# Patient Record
Sex: Female | Born: 1945
Health system: Southern US, Community
[De-identification: ages and names within clinical notes are randomized; demographics above are authoritative.]

## PROBLEM LIST (undated history)

## (undated) DIAGNOSIS — K5900 Constipation, unspecified: Secondary | ICD-10-CM

## (undated) DIAGNOSIS — K259 Gastric ulcer, unspecified as acute or chronic, without hemorrhage or perforation: Secondary | ICD-10-CM

## (undated) DIAGNOSIS — H548 Legal blindness, as defined in USA: Secondary | ICD-10-CM

## (undated) DIAGNOSIS — I495 Sick sinus syndrome: Secondary | ICD-10-CM

## (undated) DIAGNOSIS — N281 Cyst of kidney, acquired: Secondary | ICD-10-CM

## (undated) DIAGNOSIS — E559 Vitamin D deficiency, unspecified: Secondary | ICD-10-CM

## (undated) DIAGNOSIS — I441 Atrioventricular block, second degree: Secondary | ICD-10-CM

## (undated) DIAGNOSIS — R2689 Other abnormalities of gait and mobility: Secondary | ICD-10-CM

## (undated) DIAGNOSIS — R296 Repeated falls: Secondary | ICD-10-CM

## (undated) DIAGNOSIS — R600 Localized edema: Secondary | ICD-10-CM

## (undated) DIAGNOSIS — I10 Essential (primary) hypertension: Secondary | ICD-10-CM

## (undated) DIAGNOSIS — T8859XA Other complications of anesthesia, initial encounter: Secondary | ICD-10-CM

## (undated) DIAGNOSIS — M1990 Unspecified osteoarthritis, unspecified site: Secondary | ICD-10-CM

## (undated) DIAGNOSIS — Z87828 Personal history of other (healed) physical injury and trauma: Secondary | ICD-10-CM

## (undated) DIAGNOSIS — H269 Unspecified cataract: Secondary | ICD-10-CM

## (undated) DIAGNOSIS — Z860100 Personal history of colon polyps, unspecified: Secondary | ICD-10-CM

## (undated) DIAGNOSIS — N200 Calculus of kidney: Secondary | ICD-10-CM

## (undated) DIAGNOSIS — T7840XA Allergy, unspecified, initial encounter: Secondary | ICD-10-CM

## (undated) DIAGNOSIS — Z45018 Encounter for adjustment and management of other part of cardiac pacemaker: Secondary | ICD-10-CM

## (undated) DIAGNOSIS — K219 Gastro-esophageal reflux disease without esophagitis: Secondary | ICD-10-CM

## (undated) DIAGNOSIS — R202 Paresthesia of skin: Secondary | ICD-10-CM

## (undated) DIAGNOSIS — H348192 Central retinal vein occlusion, unspecified eye, stable: Secondary | ICD-10-CM

## (undated) DIAGNOSIS — E669 Obesity, unspecified: Secondary | ICD-10-CM

## (undated) DIAGNOSIS — R0602 Shortness of breath: Secondary | ICD-10-CM

## (undated) DIAGNOSIS — W19XXXA Unspecified fall, initial encounter: Secondary | ICD-10-CM

## (undated) DIAGNOSIS — R350 Frequency of micturition: Secondary | ICD-10-CM

## (undated) DIAGNOSIS — F419 Anxiety disorder, unspecified: Secondary | ICD-10-CM

## (undated) DIAGNOSIS — T4145XA Adverse effect of unspecified anesthetic, initial encounter: Secondary | ICD-10-CM

## (undated) DIAGNOSIS — Z950 Presence of cardiac pacemaker: Secondary | ICD-10-CM

## (undated) DIAGNOSIS — Z8709 Personal history of other diseases of the respiratory system: Secondary | ICD-10-CM

## (undated) HISTORY — DX: Anxiety disorder, unspecified: F41.9

## (undated) HISTORY — PX: CHOLECYSTECTOMY: SHX55

## (undated) HISTORY — DX: Essential (primary) hypertension: I10

## (undated) HISTORY — DX: Central retinal vein occlusion, unspecified eye, stable: H34.8192

## (undated) HISTORY — DX: Calculus of kidney: N20.0

## (undated) HISTORY — PX: UPPER GI ENDOSCOPY: SHX6162

## (undated) HISTORY — DX: Localized edema: R60.0

## (undated) HISTORY — DX: Allergy, unspecified, initial encounter: T78.40XA

## (undated) HISTORY — DX: Gastro-esophageal reflux disease without esophagitis: K21.9

## (undated) HISTORY — PX: TONSILLECTOMY: SUR1361

## (undated) HISTORY — DX: Obesity, unspecified: E66.9

## (undated) HISTORY — DX: Vitamin D deficiency, unspecified: E55.9

## (undated) HISTORY — PX: EYE SURGERY: SHX253

## (undated) HISTORY — DX: Constipation, unspecified: K59.00

## (undated) HISTORY — DX: Shortness of breath: R06.02

---
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
## 1992-01-22 DIAGNOSIS — J329 Chronic sinusitis, unspecified: Secondary | ICD-10-CM | POA: Insufficient documentation

## 1998-01-07 ENCOUNTER — Encounter: Admission: RE | Admit: 1998-01-07 | Discharge: 1998-04-07 | Payer: Self-pay | Admitting: Family Medicine

## 1998-02-28 ENCOUNTER — Emergency Department (HOSPITAL_COMMUNITY): Admission: EM | Admit: 1998-02-28 | Discharge: 1998-02-28 | Payer: Self-pay | Admitting: Emergency Medicine

## 1998-05-22 ENCOUNTER — Encounter: Admission: RE | Admit: 1998-05-22 | Discharge: 1998-08-20 | Payer: Self-pay | Admitting: Family Medicine

## 2000-03-08 ENCOUNTER — Encounter: Admission: RE | Admit: 2000-03-08 | Discharge: 2000-06-06 | Payer: Self-pay | Admitting: Family Medicine

## 2000-05-31 ENCOUNTER — Encounter: Admission: RE | Admit: 2000-05-31 | Discharge: 2000-08-29 | Payer: Self-pay | Admitting: Family Medicine

## 2002-06-19 ENCOUNTER — Encounter: Admission: RE | Admit: 2002-06-19 | Discharge: 2002-09-17 | Payer: Self-pay

## 2002-10-04 ENCOUNTER — Encounter: Admission: RE | Admit: 2002-10-04 | Discharge: 2003-01-02 | Payer: Self-pay

## 2003-01-14 ENCOUNTER — Encounter: Admission: RE | Admit: 2003-01-14 | Discharge: 2003-01-14 | Payer: Self-pay | Admitting: Family Medicine

## 2004-09-17 ENCOUNTER — Other Ambulatory Visit: Admission: RE | Admit: 2004-09-17 | Discharge: 2004-09-17 | Payer: Self-pay | Admitting: Family Medicine

## 2004-10-19 LAB — HM DEXA SCAN: HM Dexa Scan: NORMAL

## 2006-01-11 ENCOUNTER — Ambulatory Visit: Payer: Self-pay | Admitting: Family Medicine

## 2006-01-12 ENCOUNTER — Emergency Department (HOSPITAL_COMMUNITY): Admission: EM | Admit: 2006-01-12 | Discharge: 2006-01-12 | Payer: Self-pay | Admitting: Emergency Medicine

## 2006-02-08 ENCOUNTER — Emergency Department (HOSPITAL_COMMUNITY): Admission: EM | Admit: 2006-02-08 | Discharge: 2006-02-08 | Payer: Self-pay | Admitting: Emergency Medicine

## 2006-03-11 ENCOUNTER — Ambulatory Visit (HOSPITAL_COMMUNITY): Admission: RE | Admit: 2006-03-11 | Discharge: 2006-03-11 | Payer: Self-pay | Admitting: Certified Registered"

## 2006-03-21 ENCOUNTER — Ambulatory Visit (HOSPITAL_COMMUNITY): Admission: RE | Admit: 2006-03-21 | Discharge: 2006-03-21 | Payer: Self-pay | Admitting: Surgery

## 2006-03-21 ENCOUNTER — Encounter (INDEPENDENT_AMBULATORY_CARE_PROVIDER_SITE_OTHER): Payer: Self-pay | Admitting: *Deleted

## 2007-02-14 ENCOUNTER — Ambulatory Visit: Payer: Self-pay | Admitting: Family Medicine

## 2007-02-20 ENCOUNTER — Ambulatory Visit: Payer: Self-pay | Admitting: Family Medicine

## 2007-03-06 ENCOUNTER — Ambulatory Visit: Payer: Self-pay | Admitting: Family Medicine

## 2007-05-30 ENCOUNTER — Ambulatory Visit: Payer: Self-pay | Admitting: Family Medicine

## 2008-02-27 ENCOUNTER — Ambulatory Visit: Payer: Self-pay | Admitting: Family Medicine

## 2008-02-27 ENCOUNTER — Other Ambulatory Visit: Admission: RE | Admit: 2008-02-27 | Discharge: 2008-02-27 | Payer: Self-pay | Admitting: Family Medicine

## 2008-02-27 ENCOUNTER — Encounter: Payer: Self-pay | Admitting: Family Medicine

## 2008-02-27 LAB — HM PAP SMEAR: HM Pap smear: NORMAL

## 2009-02-21 HISTORY — PX: COLONOSCOPY: SHX174

## 2009-02-21 LAB — HM COLONOSCOPY

## 2009-05-06 ENCOUNTER — Ambulatory Visit: Payer: Self-pay | Admitting: Family Medicine

## 2009-05-21 ENCOUNTER — Encounter (INDEPENDENT_AMBULATORY_CARE_PROVIDER_SITE_OTHER): Payer: Self-pay | Admitting: *Deleted

## 2009-05-25 ENCOUNTER — Ambulatory Visit: Payer: Self-pay | Admitting: Gastroenterology

## 2009-06-01 ENCOUNTER — Telehealth (INDEPENDENT_AMBULATORY_CARE_PROVIDER_SITE_OTHER): Payer: Self-pay | Admitting: *Deleted

## 2009-06-03 ENCOUNTER — Ambulatory Visit: Payer: Self-pay | Admitting: Gastroenterology

## 2009-06-08 ENCOUNTER — Encounter: Payer: Self-pay | Admitting: Gastroenterology

## 2010-03-23 NOTE — Letter (Signed)
Summary: [facility] Instructions  [facility]  [address] [place], [place] [address]   Phone: [phone]  Fax: [phone]       Margaret Reese    66-Feb-1947    MRN: [id number]        Procedure Day Dorna Bloom:  Wednesday  06/03/2009       Arrival Time:  8:00 am     Procedure Time: 9:00 am     Location of Procedure:                    _x_  [facility] (4th Floor)                        PREPARATION FOR COLONOSCOPY WITH MOVIPREP   Starting 5 days prior to your procedure _4/09/2009 _ do not eat nuts, seeds, popcorn, corn, beans, peas,  salads, or any raw vegetables.  Do not take any fiber supplements (e.g. Metamucil, Citrucel, and Benefiber).  THE DAY BEFORE YOUR PROCEDURE         DATE: _ 06/02/2009 _  DAY: _ Tuesday _  1.  Drink clear liquids the entire day-NO SOLID FOOD  2.  Do not drink anything colored red or purple.  Avoid juices with pulp.  No orange juice.  3.  Drink at least 64 oz. (8 glasses) of fluid/clear liquids during the day to prevent dehydration and help the prep work efficiently.  CLEAR LIQUIDS INCLUDE: Water Jello Ice Popsicles Tea (sugar ok, no milk/cream) Powdered fruit flavored drinks Coffee (sugar ok, no milk/cream) Gatorade Juice: apple, white grape, white cranberry  Lemonade Clear bullion, consomm, broth Carbonated beverages (any kind) Strained chicken noodle soup Hard Candy                             4.  In the morning, mix first dose of MoviPrep solution:    Empty 1 Pouch A and 1 Pouch B into the disposable container    Add lukewarm drinking water to the top line of the container. Mix to dissolve    Refrigerate (mixed solution should be used within 24 hrs)  5.  Begin drinking the prep at 5:00 p.m. The MoviPrep container is divided by 4 marks.   Every 15 minutes drink the solution down to the next mark (approximately 8 oz) until the full liter is complete.   6.  Follow completed prep with 16 oz of clear liquid  of your choice (Nothing red or purple).  Continue to drink clear liquids until bedtime.  7.  Before going to bed, mix second dose of MoviPrep solution:    Empty 1 Pouch A and 1 Pouch B into the disposable container    Add lukewarm drinking water to the top line of the container. Mix to dissolve    Refrigerate  THE DAY OF YOUR PROCEDURE      DATE: _ 06/03/2009 _ DAY: _Wednesday _  Beginning at _ 4:00 _a.m. (5 hours before procedure):         1. Every 15 minutes, drink the solution down to the next mark (approx 8 oz) until the full liter is complete.  2. Follow completed prep with 16 oz. of clear liquid of your choice.    3. You may drink clear liquids until _ 7:00 am _ (2 HOURS BEFORE PROCEDURE).   MEDICATION INSTRUCTIONS  Unless otherwise instructed, you should take regular prescription medications  with a small sip of water   as early as possible the morning of your procedure.  Diabetic patients - see separate instructions.   Additional medication instructions: _ Do not take your HCTZ the morning of your procedure.         OTHER INSTRUCTIONS  You will need a responsible adult at least 66 years of age to accompany you and drive you home.   This person must remain in the waiting room during your procedure.  Wear loose fitting clothing that is easily removed.  Leave jewelry and other valuables at home.  However, you may wish to bring a book to read or  an iPod/MP3 player to listen to music as you wait for your procedure to start.  Remove all body piercing jewelry and leave at home.  Total time from sign-in until discharge is approximately 2-3 hours.  You should go home directly after your procedure and rest.  You can resume normal activities the  day after your procedure.  The day of your procedure you should not:   Drive   Make legal decisions   Operate machinery   Drink alcohol   Return to work  You will receive specific instructions about eating,  activities and medications before you leave.    The above instructions have been reviewed and explained to me by   [name], RN______________________    I fully understand and can verbalize these instructions _____________________________ Date _________

## 2010-03-23 NOTE — Letter (Signed)
Summary: Results Letter  [facility]  [address] [place], [place] [address]   Phone: [phone]  Fax: [phone]        June 08, 2009 MRN: [id number]    Margaret Reese [address] [place], [place]  [address]    Dear Margaret Reese,   At least one of the polyps removed during your recent procedure was proven to be adenomatous.  These are pre-cancerous polyps that may have grown into cancers if they had not been removed.  Based on current nationally recognized surveillance guidelines, I recommend that you have a repeat colonoscopy in 5 years.  We will therefore put your information in our reminder system and will contact you in 5 years to schedule a repeat procedure.  Please call if you have any questions or concerns.       Sincerely,  Rachael Fee MD  This letter has been electronically signed by your physician.  Appended Document: Results Letter letter mailed 4.20.11.

## 2010-03-23 NOTE — Miscellaneous (Signed)
Summary: previsit  Clinical Lists Changes  Medications: Added new medication of MOVIPREP 100 GM  SOLR (PEG-KCL-NACL-NASULF-NA ASC-C) As directed - Signed Rx of MOVIPREP 100 GM  SOLR (PEG-KCL-NACL-NASULF-NA ASC-C) As directed;  #1 x 0;  Signed;  Entered by: [name] RN;  Authorized by: Rachael Fee MD;  Method used: Electronically to [facility]. [phone]*, [name], [place], [place]  [address], Ph: [phone], Fax: [phone] Observations: Added new observation of ALLERGY REV: Done (05/25/2009 10:30)    Prescriptions: MOVIPREP 100 GM  SOLR (PEG-KCL-NACL-NASULF-NA ASC-C) As directed  #1 x 0   Entered by:   [name] RN   Authorized by:   Rachael Fee MD   Signed by:   [name] RN on 05/25/2009   Method used:   Electronically to        [facility]. [phone]* (retail)       [address]       [place], [place]  [address]       Ph: [phone]       Fax: [phone]   RxID:   [id number]

## 2010-03-23 NOTE — Progress Notes (Signed)
Summary: Questions about procedure  Phone Note Call from Patient Call back at Home Phone [phone]   Caller: Patient Call For: Dr. Christella Hartigan Reason for Call: Talk to Nurse Summary of Call: pt. has had kidney stones 4-22yrs ago. Passed some and wants to know if that would affect procedure Initial call taken by: Karna Christmas,  June 01, 2009 11:49 AM  Follow-up for Phone Call        Left message for patient that kidney stones would not effect our procedure at all and to do her prep as directed.  call if any further questions. Follow-up by: [name] RN,  June 01, 2009 3:21 PM

## 2010-03-23 NOTE — Procedures (Signed)
Summary: Colonoscopy  Patient: Margaret Reese Note: All result statuses are Final unless otherwise noted.  Tests: (1) Colonoscopy (COL)   COL Colonoscopy           DONE     [facility]     [address]     [place], [place]  [address]           COLONOSCOPY PROCEDURE REPORT           PATIENT:  Margaret Reese, Margaret Reese  MR#:  [id number]     BIRTHDATE:  09-Jan-1946, 63 yrs. old  GENDER:  female     ENDOSCOPIST:  Rachael Fee, MD     REF. BY:  Sharlot Gowda, M.D.     PROCEDURE DATE:  06/03/2009     PROCEDURE:  Colonoscopy with snare polypectomy     ASA CLASS:  Class II     INDICATIONS:  Routine Risk Screening     MEDICATIONS:   Fentanyl 75 mcg IV, Versed 8 mg IV     DESCRIPTION OF PROCEDURE:   After the risks benefits and     alternatives of the procedure were thoroughly explained, informed     consent was obtained.  Digital rectal exam was performed and     revealed no rectal masses.   The LB CF-[id number] endoscope     was introduced through the anus and advanced to the cecum, which     was identified by both the appendix and ileocecal valve, without     limitations.  The quality of the prep was excellent, using     MoviPrep.  The instrument was then slowly withdrawn as the colon     was fully examined.     <<PROCEDUREIMAGES>>     FINDINGS:  A sessile polyp was found in the descending colon. This     was 3mm, removed with cold snare and sent to pathology (see     image5).  Mild diverticulosis was found in the sigmoid to     descending colon segments (see image1).  This was otherwise a     normal examination of the colon (see image2, image4, and image6).     Retroflexed views in the rectum revealed no abnormalities.    The     scope was then withdrawn from the patient and the procedure     completed.     COMPLICATIONS:  None     ENDOSCOPIC IMPRESSION:     1) Small sessile polyp in the descending colon; removed and sent     to pathology     2) Mild diverticulosis in the  sigmoid to descending colon     segments     3) Otherwise normal examination           RECOMMENDATIONS:     1) If the polyp(s) removed today are proven to be adenomatous     (pre-cancerous) polyps, you will need a repeat colonoscopy in 5     years. Otherwise you should continue to follow colorectal cancer     screening guidelines for "routine risk" patients with colonoscopy     in 10 years.     2) You will receive a letter within 1-2 weeks with the results     of your biopsy as well as final recommendations. Please call my     office if you have not received a letter after 3 weeks.           ______________________________  Rachael Fee, MD           n.     Rosalie Doctor:   Rachael Fee at 06/03/2009 09:11 AM           Gemma Payor, [id number]  Note: An exclamation mark (!) indicates a result that was not dispersed into the flowsheet. Document Creation Date: 06/03/2009 9:12 AM _______________________________________________________________________  (1) Order result status: Final Collection or observation date-time: 06/03/2009 09:07 Requested date-time:  Receipt date-time:  Reported date-time:  Referring Physician:   Ordering Physician: Rob Bunting [phone]) Specimen Source:  Source: Launa Grill Order Number: [id number] Lab site:   Appended Document: Colonoscopy     Procedures Next Due Date:    Colonoscopy: 05/2014

## 2010-06-08 ENCOUNTER — Encounter: Payer: Self-pay | Admitting: Family Medicine

## 2010-06-09 ENCOUNTER — Encounter: Payer: Self-pay | Admitting: Family Medicine

## 2010-06-09 DIAGNOSIS — J40 Bronchitis, not specified as acute or chronic: Secondary | ICD-10-CM

## 2010-06-09 DIAGNOSIS — J329 Chronic sinusitis, unspecified: Secondary | ICD-10-CM

## 2010-07-08 ENCOUNTER — Encounter: Payer: Self-pay | Admitting: Family Medicine

## 2010-07-08 ENCOUNTER — Ambulatory Visit (INDEPENDENT_AMBULATORY_CARE_PROVIDER_SITE_OTHER): Payer: Medicare Other | Admitting: Family Medicine

## 2010-07-08 VITALS — BP 140/80 | HR 82 | Ht 61.2 in | Wt 239.0 lb

## 2010-07-08 DIAGNOSIS — I10 Essential (primary) hypertension: Secondary | ICD-10-CM | POA: Insufficient documentation

## 2010-07-08 DIAGNOSIS — Z0000 Encounter for general adult medical examination without abnormal findings: Secondary | ICD-10-CM

## 2010-07-08 DIAGNOSIS — F439 Reaction to severe stress, unspecified: Secondary | ICD-10-CM

## 2010-07-08 DIAGNOSIS — K219 Gastro-esophageal reflux disease without esophagitis: Secondary | ICD-10-CM | POA: Insufficient documentation

## 2010-07-08 DIAGNOSIS — E669 Obesity, unspecified: Secondary | ICD-10-CM | POA: Insufficient documentation

## 2010-07-08 DIAGNOSIS — N200 Calculus of kidney: Secondary | ICD-10-CM

## 2010-07-08 DIAGNOSIS — Z733 Stress, not elsewhere classified: Secondary | ICD-10-CM

## 2010-07-08 DIAGNOSIS — E785 Hyperlipidemia, unspecified: Secondary | ICD-10-CM | POA: Insufficient documentation

## 2010-07-08 LAB — CBC WITH DIFFERENTIAL/PLATELET
Basophils Absolute: 0 10*3/uL (ref 0.0–0.1)
Basophils Relative: 0 % (ref 0–1)
Eosinophils Absolute: 0.3 10*3/uL (ref 0.0–0.7)
Eosinophils Relative: 4 % (ref 0–5)
HCT: 38.1 % (ref 36.0–46.0)
Hemoglobin: 12.7 g/dL (ref 12.0–15.0)
Lymphocytes Relative: 28 % (ref 12–46)
Lymphs Abs: 1.9 10*3/uL (ref 0.7–4.0)
MCH: 29.1 pg (ref 26.0–34.0)
MCHC: 33.3 g/dL (ref 30.0–36.0)
MCV: 87.2 fL (ref 78.0–100.0)
Monocytes Absolute: 0.3 10*3/uL (ref 0.1–1.0)
Monocytes Relative: 4 % (ref 3–12)
Neutro Abs: 4.4 10*3/uL (ref 1.7–7.7)
Neutrophils Relative %: 63 % (ref 43–77)
Platelets: 237 10*3/uL (ref 150–400)
RBC: 4.37 MIL/uL (ref 3.87–5.11)
RDW: 13.9 % (ref 11.5–15.5)
WBC: 6.9 10*3/uL (ref 4.0–10.5)

## 2010-07-08 LAB — COMPREHENSIVE METABOLIC PANEL
ALT: 23 U/L (ref 0–35)
AST: 22 U/L (ref 0–37)
Albumin: 4.4 g/dL (ref 3.5–5.2)
Alkaline Phosphatase: 116 U/L (ref 39–117)
BUN: 17 mg/dL (ref 6–23)
CO2: 25 mEq/L (ref 19–32)
Calcium: 10.2 mg/dL (ref 8.4–10.5)
Chloride: 101 mEq/L (ref 96–112)
Creat: 0.92 mg/dL (ref 0.40–1.20)
Glucose, Bld: 104 mg/dL — ABNORMAL HIGH (ref 70–99)
Potassium: 3.8 mEq/L (ref 3.5–5.3)
Sodium: 139 mEq/L (ref 135–145)
Total Bilirubin: 0.7 mg/dL (ref 0.3–1.2)
Total Protein: 6.9 g/dL (ref 6.0–8.3)

## 2010-07-08 LAB — LIPID PANEL
Cholesterol: 225 mg/dL — ABNORMAL HIGH (ref 0–200)
HDL: 49 mg/dL (ref >39)
LDL Cholesterol: 141 mg/dL — ABNORMAL HIGH (ref 0–99)
Total CHOL/HDL Ratio: 4.6 Ratio
Triglycerides: 175 mg/dL — ABNORMAL HIGH (ref <150)
VLDL: 35 mg/dL (ref 0–40)

## 2010-07-08 LAB — POCT URINALYSIS DIPSTICK
Bilirubin, UA: NEGATIVE
Blood, UA: NEGATIVE
Glucose, UA: NEGATIVE
Ketones, UA: NEGATIVE
Leukocytes, UA: NEGATIVE
Nitrite, UA: NEGATIVE
Protein, UA: NEGATIVE
Spec Grav, UA: 1.015
Urobilinogen, UA: NEGATIVE
pH, UA: 5

## 2010-07-08 MED ORDER — LISINOPRIL-HYDROCHLOROTHIAZIDE 10-12.5 MG PO TABS: 1.0000 | ORAL_TABLET | Freq: Every day | ORAL | Status: DC

## 2010-07-08 NOTE — Progress Notes (Signed)
Subjective:    Patient ID: Margaret Reese, female    DOB: Mar 29, 1945, 65 y.o.   MRN: [id number]  HPI she is here for a complete examination. Her main concern today is difficulty with pain. It was very difficult to get a good history from her. Since mid-December she has had difficulty with tingling sensation in her fingers that lasts for several minutes and cannot be associated with anything in particular. She also complains of a tingling sensation in her right leg that is usually associated with physical activities however this does not bother her if she is walking and holding onto a shopping cart. She was seen in urgent care for this and given a muscle relaxer and pain medication which did help. She notes that she has upper back pain especially with lifting activities. She is in the process of moving to a new home. He has a history of renal stones and is on potassium citrate for this. She was given Nexium did help with some of her GI symptoms which did help relieve she describes as a gurgling sensation in the back of her throat. He has been under a lot of stress dealing with recent illness of her husband as well as that of her mother . She has a previous history of asthma states that since being placed on the Nexium, she has not had to use any inhaler. Physical activities are quite limited.    Review of Systems  Constitutional: Positive for activity change.  HENT: Negative.   Eyes: Negative.   Respiratory: Negative for cough, chest tightness and shortness of breath.   Cardiovascular: Negative for chest pain, palpitations and leg swelling.  Gastrointestinal: Negative.   Genitourinary: Negative.   Musculoskeletal: Positive for back pain and arthralgias.  Neurological: Negative.        Objective:   Physical Exam BP 140/80  Pulse 82  Ht 5' 1.2" (1.554 m)  Wt 239 lb (108.41 kg)  BMI 44.86 kg/m2  General Appearance:    Alert, cooperative, no distress, appears stated age  Head:     Normocephalic, without obvious abnormality, atraumatic  Eyes:    PERRL, conjunctiva/corneas clear, EOM's intact, fundi    benign  Ears:    Normal TM's and external ear canals  Nose:   Nares normal, mucosa normal, no drainage or sinus   tenderness  Throat:   Lips, mucosa, and tongue normal; teeth and gums normal  Neck:   Supple, no lymphadenopathy;  thyroid:  no   enlargement/tenderness/nodules; no carotid   bruit or JVD  Back:    Spine nontender, no curvature, ROM normal, no CVA     tenderness  Lungs:     Clear to auscultation bilaterally without wheezes, rales or     ronchi; respirations unlabored  Chest Wall:    No tenderness or deformity   Heart:    Regular rate and rhythm, S1 and S2 normal, no murmur, rub   or gallop  Breast Exam:    Deferred   Abdomen:     Soft, non-tender, nondistended, normoactive bowel sounds,    no masses, no hepatosplenomegaly  Genitalia:    of exam shows no masses      Extremities:   No clubbing, cyanosis or edema  Pulses:   2+ and symmetric all extremities  Skin:   Skin color, texture, turgor normal, no rashes or lesions  Lymph nodes:   Cervical, supraclavicular, and axillary nodes normal  Neurologic:   CNII-XII intact, normal strength, sensation and gait;  reflexes 2+ and symmetric throughout          Psych:   Normal mood, affect, hygiene and grooming.          Assessment & Plan:  See problem list We discussed the back pain in detail as well as leg pain. I explained that I did not think this was anything significant and mainly musculoskeletal. I will refer her to physical for a general backend rehabilitation program. Also encouraged her to become more physically active and work on range of motion. I also discussed the stresses she is under. She did become quite tearful. Legitimized the amount of stress she is under do to recent deaths as well as move and dealing with her husband's illness.

## 2010-07-08 NOTE — Patient Instructions (Signed)
Continue on your present medications. We will refer you to physical therapy to help with your back and leg discomfort. Return here if any difficulties.

## 2010-07-09 ENCOUNTER — Telehealth: Payer: Self-pay

## 2010-07-09 NOTE — Progress Notes (Signed)
Left message for pt to call me back 

## 2010-07-09 NOTE — Telephone Encounter (Signed)
Pt informed of labs mailed diet info

## 2010-07-09 NOTE — Op Note (Signed)
NAME:  Margaret Reese, Margaret Reese              ACCOUNT NO.:  [id number]   MEDICAL RECORD NO.:  [id number]          PATIENT TYPE:  AMB   LOCATION:  DAY                          FACILITY:  [facility]   PHYSICIAN:  Thornton Park. Daphine Deutscher, MD  DATE OF BIRTH:  1945/03/25   DATE OF PROCEDURE:  03/21/2006  DATE OF DISCHARGE:                               OPERATIVE REPORT   PREOPERATIVE DIAGNOSIS:  Gallstones and chronic cholecystitis.   POSTOPERATIVE DIAGNOSIS:  Severe chronic cholecystitis.   PROCEDURE:  Laparoscopic cholecystectomy with intraoperative  cholangiogram (normal).   SURGEON:  Thornton Park. Daphine Deutscher, MD   ASSISTANT:  Anselm Pancoast. Zachery Dakins, M.D.   ANESTHESIA:  General endotracheal.   DESCRIPTION OF PROCEDURE:  Margaret Reese was taken to room 6 in the  morning of March 21, 2006 and given general anesthesia.  The abdomen  was prepped with Techni-Care and draped sterilely.  A longitudinal  incision was made down on the umbilicus through which the Hasson cannula  was inserted.  The abdomen was inflated and three other trocars were  placed.  Before doing that I had to take adhesions down on the right  side from previous surgery where her colon was stuck up and these were  avascular adhesions and were taken down with sharp dissection with  scissors.  The gallbladder was grasped, elevated, and it was noted to be  fused to the duodenum.  This was a marked change as if the beginnings of  a cholecystoduodenal fistula were occurring.  However, I was able to  discern a plane and with sharp dissection to take this down.  I  dissected free Calot's triangle and achieved a critical view.  I put a  clip on the gallbladder and incised a very tiny cystic duct.  I inserted  a Reddick catheter and took a dynamic cholangiogram which showed good  intrahepatic filling and free flow into the duodenum through a nice  tapered distal common bile duct.  No stones were noted.  The cystic duct  was then triple clipped and  divided.  Cystic artery had been identified  and was triple clipped and divided along with the Calot's node and then  the gallbladder was removed from the gallbladder bed with hook  electrocautery without entering it.  It was placed in a bag and brought  through the umbilicus where I had to enlarge the incision just to get  this large mass of stone material out.  I  repaired the umbilical defect not only with 0 Vicryls but with 0  Prolene.  All port sites were injected with 0.5% Marcaine and were  closed with 4-0 Vicryl.  The patient seemed to tolerate the procedure  well and was taken to recovery room in satisfactory condition.      Thornton Park Daphine Deutscher, MD  Electronically Signed     MBM/MEDQ  D:  03/21/2006  T:  03/21/2006  Job:  [id number]   cc:   Sharlot Gowda, M.D.  Fax: [phone]

## 2010-07-09 NOTE — Telephone Encounter (Signed)
Left message for pt to call me back 

## 2010-12-02 ENCOUNTER — Encounter: Payer: Self-pay | Admitting: Family Medicine

## 2011-02-28 ENCOUNTER — Encounter: Payer: Self-pay | Admitting: Family Medicine

## 2011-02-28 ENCOUNTER — Ambulatory Visit (INDEPENDENT_AMBULATORY_CARE_PROVIDER_SITE_OTHER): Payer: Medicare Other | Admitting: Family Medicine

## 2011-02-28 VITALS — BP 132/80 | HR 90 | Ht 61.0 in | Wt 236.0 lb

## 2011-02-28 DIAGNOSIS — K219 Gastro-esophageal reflux disease without esophagitis: Secondary | ICD-10-CM

## 2011-02-28 NOTE — Patient Instructions (Signed)
Call me Friday and let me know how you're doing on this medicine

## 2011-02-28 NOTE — Progress Notes (Signed)
  Subjective:    Patient ID: Margaret Reese, female    DOB: [birth year], 66 y.o.   MRN: [id number]  HPI She is here for consultation concerning continued difficulty with reflux symptoms. She had been on Nexium in the past however this stopped being effective. She then switched to Prilosec which was not effective. She recently started back on Nexium and again is having difficulty with acid reflux symptoms.  Review of Systems     O bjective:        Physical Exam alert and in no distress. Tympanic membranes and canals are normal. Throat is clear. Tonsils are normal. Neck is supple without adenopathy or thyromegaly. Cardiac exam shows a regular sinus rhythm without murmurs or gallops. Lungs are clear to auscultation.        Assessment & Plan:   1. GERD (gastroesophageal reflux disease)    a sample of Dexilant given. If this is not successful, refer to GI.

## 2011-03-04 ENCOUNTER — Telehealth: Payer: Self-pay | Admitting: Family Medicine

## 2011-03-04 NOTE — Telephone Encounter (Signed)
Go ahead and refer her. Unfortunately I don't have any other masses to offer

## 2011-03-04 NOTE — Telephone Encounter (Signed)
Pt informed aqpt jan14

## 2011-03-04 NOTE — Telephone Encounter (Signed)
Pt called.  The Dexilant is not working.  Do you want to refer her?  In the meantime, what do you want her to take?  She has one Dexilant left and then she also has some Nexium she could take?  Please advise pt.      [facility]

## 2011-03-07 DIAGNOSIS — Z86010 Personal history of colonic polyps: Secondary | ICD-10-CM | POA: Diagnosis not present

## 2011-03-07 DIAGNOSIS — K219 Gastro-esophageal reflux disease without esophagitis: Secondary | ICD-10-CM | POA: Diagnosis not present

## 2011-03-15 DIAGNOSIS — D131 Benign neoplasm of stomach: Secondary | ICD-10-CM | POA: Diagnosis not present

## 2011-03-15 DIAGNOSIS — K219 Gastro-esophageal reflux disease without esophagitis: Secondary | ICD-10-CM | POA: Diagnosis not present

## 2011-03-18 DIAGNOSIS — K219 Gastro-esophageal reflux disease without esophagitis: Secondary | ICD-10-CM | POA: Diagnosis not present

## 2011-04-25 DIAGNOSIS — K219 Gastro-esophageal reflux disease without esophagitis: Secondary | ICD-10-CM | POA: Diagnosis not present

## 2011-05-11 DIAGNOSIS — H2510 Age-related nuclear cataract, unspecified eye: Secondary | ICD-10-CM | POA: Diagnosis not present

## 2011-05-11 DIAGNOSIS — H348392 Tributary (branch) retinal vein occlusion, unspecified eye, stable: Secondary | ICD-10-CM | POA: Diagnosis not present

## 2011-08-09 ENCOUNTER — Encounter: Payer: Self-pay | Admitting: Internal Medicine

## 2011-08-09 DIAGNOSIS — Z1231 Encounter for screening mammogram for malignant neoplasm of breast: Secondary | ICD-10-CM | POA: Diagnosis not present

## 2011-08-10 ENCOUNTER — Encounter: Payer: Self-pay | Admitting: Family Medicine

## 2011-08-10 ENCOUNTER — Ambulatory Visit (INDEPENDENT_AMBULATORY_CARE_PROVIDER_SITE_OTHER): Payer: Medicare Other | Admitting: Family Medicine

## 2011-08-10 VITALS — BP 124/80 | HR 76 | Ht 61.0 in | Wt 224.0 lb

## 2011-08-10 DIAGNOSIS — K219 Gastro-esophageal reflux disease without esophagitis: Secondary | ICD-10-CM

## 2011-08-10 DIAGNOSIS — Z79899 Other long term (current) drug therapy: Secondary | ICD-10-CM

## 2011-08-10 DIAGNOSIS — N200 Calculus of kidney: Secondary | ICD-10-CM

## 2011-08-10 DIAGNOSIS — E785 Hyperlipidemia, unspecified: Secondary | ICD-10-CM

## 2011-08-10 DIAGNOSIS — I10 Essential (primary) hypertension: Secondary | ICD-10-CM

## 2011-08-10 DIAGNOSIS — J45909 Unspecified asthma, uncomplicated: Secondary | ICD-10-CM

## 2011-08-10 LAB — CBC WITH DIFFERENTIAL/PLATELET
Basophils Absolute: 0 10*3/uL (ref 0.0–0.1)
Basophils Relative: 0 % (ref 0–1)
Eosinophils Absolute: 0.2 10*3/uL (ref 0.0–0.7)
Eosinophils Relative: 3 % (ref 0–5)
HCT: 39.8 % (ref 36.0–46.0)
Hemoglobin: 13.4 g/dL (ref 12.0–15.0)
Lymphocytes Relative: 33 % (ref 12–46)
Lymphs Abs: 2.5 10*3/uL (ref 0.7–4.0)
MCH: 29.1 pg (ref 26.0–34.0)
MCHC: 33.7 g/dL (ref 30.0–36.0)
MCV: 86.5 fL (ref 78.0–100.0)
Monocytes Absolute: 0.3 10*3/uL (ref 0.1–1.0)
Monocytes Relative: 4 % (ref 3–12)
Neutro Abs: 4.6 10*3/uL (ref 1.7–7.7)
Neutrophils Relative %: 60 % (ref 43–77)
Platelets: 269 10*3/uL (ref 150–400)
RBC: 4.6 MIL/uL (ref 3.87–5.11)
RDW: 14.6 % (ref 11.5–15.5)
WBC: 7.6 10*3/uL (ref 4.0–10.5)

## 2011-08-10 LAB — POCT URINALYSIS DIPSTICK
Bilirubin, UA: NEGATIVE
Blood, UA: NEGATIVE
Glucose, UA: NEGATIVE
Leukocytes, UA: NEGATIVE
Nitrite, UA: NEGATIVE
Spec Grav, UA: 1.02
Urobilinogen, UA: NEGATIVE
pH, UA: 5

## 2011-08-10 LAB — HM MAMMOGRAPHY: HM Mammogram: NORMAL

## 2011-08-10 MED ORDER — POTASSIUM CITRATE ER 5 MEQ (540 MG) PO TBCR: 5.0000 meq | EXTENDED_RELEASE_TABLET | Freq: Two times a day (BID) | ORAL | Status: DC

## 2011-08-10 MED ORDER — LISINOPRIL-HYDROCHLOROTHIAZIDE 10-12.5 MG PO TABS: 1.0000 | ORAL_TABLET | Freq: Every day | ORAL | Status: DC

## 2011-08-10 MED ORDER — ALBUTEROL SULFATE HFA 108 (90 BASE) MCG/ACT IN AERS: 2.0000 | INHALATION_SPRAY | Freq: Four times a day (QID) | RESPIRATORY_TRACT | Status: DC | PRN

## 2011-08-10 NOTE — Progress Notes (Signed)
Subjective:    Patient ID: Margaret Reese, female    DOB: 03/31/1945, 66 y.o.   MRN: [id number]  HPI She is here for a general checkup including renew her medications. She is now retired and is remaining physically active. He returned is going quite well. She and her husband are starting to get involved in other activities as well as traveling. She has had some difficulty with her m easy and finds that the recumbent bike tends to help with the symptoms. She lost some weight. Presently she is on no medication for her reflux and is being followed by Dr. Elnoria Howard. Her symptoms seem to be under fairly good control. Her allergies and asthma give her very little trouble. She would like a refill on her albuterol. She does have history of renal stones and would like a refill on her medication for that. She has no other concerns or complaints.  Review of Systems  Constitutional: Negative.   HENT: Negative.   Respiratory: Negative.   Cardiovascular: Negative.   Gastrointestinal: Negative.   Genitourinary: Negative.   Musculoskeletal: Positive for arthralgias.  Psychiatric/Behavioral: Negative.        Objective:   Physical Exam BP 124/80  Pulse 76  Ht 5\' 1"  (1.549 m)  Wt 224 lb (101.606 kg)  BMI 42.32 kg/m2  General Appearance:    Alert, cooperative, no distress, appears stated age  Head:    Normocephalic, without obvious abnormality, atraumatic  Eyes:    PERRL, conjunctiva/corneas clear, EOM's intact, fundi    benign  Ears:    Normal TM's and external ear canals  Nose:   Nares normal, mucosa normal, no drainage or sinus   tenderness  Throat:   Lips, mucosa, and tongue normal; teeth and gums normal  Neck:   Supple, no lymphadenopathy;  thyroid:  no   enlargement/tenderness/nodules; no carotid   bruit or JVD  Back:    Spine nontender, no curvature, ROM normal, no CVA     tenderness  Lungs:     Clear to auscultation bilaterally without wheezes, rales or     ronchi; respirations unlabored    Chest Wall:    No tenderness or deformity   Heart:    Regular rate and rhythm, S1 and S2 normal, no murmur, rub   or gallop  Breast Exam:    Deferred to GYN  Abdomen:     Soft, non-tender, nondistended, normoactive bowel sounds,    no masses, no hepatosplenomegaly  Genitalia:    Deferred to GYN     Extremities:   No clubbing, cyanosis or edema  Pulses:   2+ and symmetric all extremities  Skin:   Skin color, texture, turgor normal, no rashes or lesions  Lymph nodes:   Cervical, supraclavicular, and axillary nodes normal  Neurologic:   CNII-XII intact, normal strength, sensation and gait; reflexes 2+ and symmetric throughout          Psych:   Normal mood, affect, hygiene and grooming.           Assessment & Plan:   1. Hypertension  POCT Urinalysis Dipstick, lisinopril-hydrochlorothiazide (PRINZIDE,ZESTORETIC) 10-12.5 MG per tablet, CBC with Differential, Comprehensive metabolic panel  2. Obesity, Class III, BMI 40-49.9 (morbid obesity)  Lipid panel  3. GERD (gastroesophageal reflux disease)    4. Hyperlipidemia    5. Asthma with allergic rhinitis  albuterol (PROAIR HFA) 108 (90 BASE) MCG/ACT inhaler  6. Renal stones  potassium citrate (UROCIT-K) 5 MEQ (540 MG) SR tablet  7. Encounter  for long-term (current) use of other medications  CBC with Differential, Comprehensive metabolic panel, Lipid panel   also discussed routine issues including colonoscopy immunizations and DEXA. She is up-to-date on all of these.

## 2011-08-11 LAB — LIPID PANEL
Cholesterol: 232 mg/dL — ABNORMAL HIGH (ref 0–200)
HDL: 56 mg/dL (ref >39)
LDL Cholesterol: 153 mg/dL — ABNORMAL HIGH (ref 0–99)
Total CHOL/HDL Ratio: 4.1 Ratio
Triglycerides: 117 mg/dL (ref <150)
VLDL: 23 mg/dL (ref 0–40)

## 2011-08-11 LAB — COMPREHENSIVE METABOLIC PANEL
ALT: 16 U/L (ref 0–35)
AST: 20 U/L (ref 0–37)
Albumin: 4.8 g/dL (ref 3.5–5.2)
Alkaline Phosphatase: 111 U/L (ref 39–117)
BUN: 32 mg/dL — ABNORMAL HIGH (ref 6–23)
CO2: 29 mEq/L (ref 19–32)
Calcium: 10.5 mg/dL (ref 8.4–10.5)
Chloride: 100 mEq/L (ref 96–112)
Creat: 1.04 mg/dL (ref 0.50–1.10)
Glucose, Bld: 85 mg/dL (ref 70–99)
Potassium: 3.7 mEq/L (ref 3.5–5.3)
Sodium: 139 mEq/L (ref 135–145)
Total Bilirubin: 0.7 mg/dL (ref 0.3–1.2)
Total Protein: 7.6 g/dL (ref 6.0–8.3)

## 2011-10-06 DIAGNOSIS — Q619 Cystic kidney disease, unspecified: Secondary | ICD-10-CM | POA: Diagnosis not present

## 2011-10-06 DIAGNOSIS — N390 Urinary tract infection, site not specified: Secondary | ICD-10-CM | POA: Diagnosis not present

## 2011-10-06 DIAGNOSIS — N200 Calculus of kidney: Secondary | ICD-10-CM | POA: Diagnosis not present

## 2011-11-07 DIAGNOSIS — Z23 Encounter for immunization: Secondary | ICD-10-CM | POA: Diagnosis not present

## 2012-05-15 DIAGNOSIS — H2510 Age-related nuclear cataract, unspecified eye: Secondary | ICD-10-CM | POA: Diagnosis not present

## 2012-05-15 DIAGNOSIS — H348392 Tributary (branch) retinal vein occlusion, unspecified eye, stable: Secondary | ICD-10-CM | POA: Diagnosis not present

## 2012-05-15 DIAGNOSIS — H31009 Unspecified chorioretinal scars, unspecified eye: Secondary | ICD-10-CM | POA: Diagnosis not present

## 2012-07-23 ENCOUNTER — Ambulatory Visit (INDEPENDENT_AMBULATORY_CARE_PROVIDER_SITE_OTHER): Payer: Medicare Other | Admitting: Family Medicine

## 2012-07-23 VITALS — BP 116/70 | HR 95 | Wt 199.0 lb

## 2012-07-23 DIAGNOSIS — M25569 Pain in unspecified knee: Secondary | ICD-10-CM

## 2012-07-23 DIAGNOSIS — M25561 Pain in right knee: Secondary | ICD-10-CM

## 2012-07-23 NOTE — Progress Notes (Signed)
  Subjective:    Patient ID: Margaret Reese, female    DOB: 09-14-1945, 67 y.o.   MRN: [id number]  HPI Approximately 2 weeks ago she noted the onset of right lateral calf and knee pain after she walked in the park. The pain occurred after she finished. It has been intermittent in intensity since then. She cannot relate anything that makes it better or worse. She has tried heat, ice, and small doses of Tylenol. She was reading on the Internet about gout and is concerned about this. It was difficult to get a coherent history from her.   Review of Systems     Objective:   Physical Exam Exam of the right knee shows no effusion. No point tenderness noted. Anterior drawer negative. McMurray's testing normal. Exam of the calf shows no redness, swelling or tenderness. Negative Homans sign.       Assessment & Plan:  Right knee pain I recommend conservative care with Aleve 2 twice per day for the next 2 weeks. If continued difficulty she will return here for reevaluation. I did complement her on her weight loss. Apparently she and her husband are involved in [employer]

## 2012-07-23 NOTE — Patient Instructions (Signed)
Use 2 Aleve twice a day for the next 2 weeks. You can do any physical activity that she can get away with. If you're still having trouble make another appoint

## 2012-08-13 ENCOUNTER — Telehealth: Payer: Self-pay | Admitting: Family Medicine

## 2012-08-13 DIAGNOSIS — I10 Essential (primary) hypertension: Secondary | ICD-10-CM

## 2012-08-13 MED ORDER — LISINOPRIL-HYDROCHLOROTHIAZIDE 10-12.5 MG PO TABS: 1.0000 | ORAL_TABLET | Freq: Every day | ORAL | Status: DC

## 2012-08-13 NOTE — Telephone Encounter (Signed)
Pt needs refill on bp meds sent to [facility] on [address]

## 2012-08-13 NOTE — Telephone Encounter (Signed)
SENT IN B/P MED  

## 2012-09-11 ENCOUNTER — Ambulatory Visit (INDEPENDENT_AMBULATORY_CARE_PROVIDER_SITE_OTHER): Payer: Medicare Other | Admitting: Family Medicine

## 2012-09-11 ENCOUNTER — Encounter: Payer: Self-pay | Admitting: Family Medicine

## 2012-09-11 VITALS — BP 110/70 | HR 89 | Ht 60.5 in | Wt 202.0 lb

## 2012-09-11 DIAGNOSIS — K219 Gastro-esophageal reflux disease without esophagitis: Secondary | ICD-10-CM | POA: Diagnosis not present

## 2012-09-11 DIAGNOSIS — J309 Allergic rhinitis, unspecified: Secondary | ICD-10-CM | POA: Diagnosis not present

## 2012-09-11 DIAGNOSIS — E785 Hyperlipidemia, unspecified: Secondary | ICD-10-CM

## 2012-09-11 DIAGNOSIS — I10 Essential (primary) hypertension: Secondary | ICD-10-CM | POA: Diagnosis not present

## 2012-09-11 DIAGNOSIS — E66813 Obesity, class 3: Secondary | ICD-10-CM

## 2012-09-11 DIAGNOSIS — M545 Low back pain, unspecified: Secondary | ICD-10-CM

## 2012-09-11 DIAGNOSIS — Z23 Encounter for immunization: Secondary | ICD-10-CM

## 2012-09-11 DIAGNOSIS — J45901 Unspecified asthma with (acute) exacerbation: Secondary | ICD-10-CM

## 2012-09-11 MED ORDER — ALBUTEROL SULFATE HFA 108 (90 BASE) MCG/ACT IN AERS: 2.0000 | INHALATION_SPRAY | Freq: Four times a day (QID) | RESPIRATORY_TRACT | Status: DC | PRN

## 2012-09-11 MED ORDER — LISINOPRIL-HYDROCHLOROTHIAZIDE 10-12.5 MG PO TABS: 1.0000 | ORAL_TABLET | Freq: Every day | ORAL | Status: DC

## 2012-09-11 NOTE — Progress Notes (Signed)
Subjective:    Patient ID: Margaret Reese, female    DOB: Jul 31, 1945, 67 y.o.   MRN: [id number]  HPI She is here for medication management. She has lost approximately 30 pounds and is quite happy with this. She also has a questionable history of allergies and asthma but does have trouble at certain times the years specifically for the last 2 weeks she has had difficulty with nighttime cough and states that her husband says she is wheezing at night. She has no sneezing, itchy watery eyes, rhinorrhea area she does have a several year history of back pain. She uses Aleve once or twice per week with good results. Her reflux seems to be under good control. Social and family history was reviewed. She is now retired and keeps quite busy.   Review of Systems  Constitutional: Negative.   HENT: Negative.   Eyes: Negative.   Respiratory: Negative.   Cardiovascular: Negative.   Gastrointestinal: Negative.   Endocrine: Negative.   Genitourinary: Negative.   Allergic/Immunologic: Negative.   Neurological: Negative.   Hematological: Negative.   Psychiatric/Behavioral: Negative.        Objective:   Physical Exam BP 110/70  Pulse 89  Ht 5' 0.5" (1.537 m)  Wt 202 lb (91.627 kg)  BMI 38.79 kg/m2  General Appearance:    Alert, cooperative, no distress, appears stated age  Head:    Normocephalic, without obvious abnormality, atraumatic  Eyes:    PERRL, conjunctiva/corneas clear, EOM's intact, fundi    benign  Ears:    Normal TM's and external ear canals  Nose:   Nares normal, mucosa normal, no drainage or sinus   tenderness  Throat:   Lips, mucosa, and tongue normal; teeth and gums normal  Neck:   Supple, no lymphadenopathy;  thyroid:  no   enlargement/tenderness/nodules; no carotid   bruit or JVD  Back:    Spine nontender, no curvature, ROM normal, no CVA     tenderness  Lungs:     Clear to auscultation bilaterally without wheezes, rales or     ronchi; respirations unlabored  Chest Wall:     No tenderness or deformity   Heart:    Regular rate and rhythm, S1 and S2 normal, no murmur, rub   or gallop  Breast Exam:    Deferred to GYN  Abdomen:     Soft, non-tender, nondistended, normoactive bowel sounds,    no masses, no hepatosplenomegaly  Genitalia:    Deferred to GYN     Extremities:   No clubbing, cyanosis or edema  Pulses:   2+ and symmetric all extremities  Skin:   Skin color, texture, turgor normal, no rashes or lesions  Lymph nodes:   Cervical, supraclavicular, and axillary nodes normal  Neurologic:   CNII-XII intact, normal strength, sensation and gait; reflexes 2+ and symmetric throughout          Psych:   Normal mood, affect, hygiene and grooming.          Assessment & Plan:  Obesity, Class III, BMI 40-49.9 (morbid obesity) - Plan: CBC with Differential, Comprehensive metabolic panel, Lipid panel  Hypertension - Plan: CBC with Differential, Comprehensive metabolic panel, lisinopril-hydrochlorothiazide (PRINZIDE,ZESTORETIC) 10-12.5 MG per tablet  Hyperlipidemia - Plan: Lipid panel  GERD (gastroesophageal reflux disease)  Allergic rhinitis due to allergen  Asthma exacerbation, allergic, mild intermittent - Plan: Pneumococcal polysaccharide vaccine 23-valent greater than or equal to 2yo subcutaneous/IM, albuterol (VENTOLIN HFA) 108 (90 BASE) MCG/ACT inhaler  Low back pain Discussed  use of the rescue inhaler. No refill given. She will call if she needs more. Discussed her weight loss and potentially stopping blood pressure medication and she would like to lose a several more pounds and then possibly stop the medication at that point. Strongly encouraged her to continue with her weight loss program.

## 2012-09-12 ENCOUNTER — Other Ambulatory Visit: Payer: Self-pay

## 2012-09-12 DIAGNOSIS — IMO0002 Reserved for concepts with insufficient information to code with codable children: Secondary | ICD-10-CM

## 2012-09-12 LAB — LIPID PANEL
Cholesterol: 209 mg/dL — ABNORMAL HIGH (ref 0–200)
HDL: 47 mg/dL (ref >39)
LDL Cholesterol: 138 mg/dL — ABNORMAL HIGH (ref 0–99)
Total CHOL/HDL Ratio: 4.4 Ratio
Triglycerides: 122 mg/dL (ref <150)
VLDL: 24 mg/dL (ref 0–40)

## 2012-09-12 LAB — CBC WITH DIFFERENTIAL/PLATELET
Basophils Absolute: 0 10*3/uL (ref 0.0–0.1)
Basophils Relative: 0 % (ref 0–1)
Eosinophils Absolute: 0.3 10*3/uL (ref 0.0–0.7)
Eosinophils Relative: 5 % (ref 0–5)
HCT: 38.1 % (ref 36.0–46.0)
Hemoglobin: 12.8 g/dL (ref 12.0–15.0)
Lymphocytes Relative: 36 % (ref 12–46)
Lymphs Abs: 2.4 10*3/uL (ref 0.7–4.0)
MCH: 28.9 pg (ref 26.0–34.0)
MCHC: 33.6 g/dL (ref 30.0–36.0)
MCV: 86 fL (ref 78.0–100.0)
Monocytes Absolute: 0.4 10*3/uL (ref 0.1–1.0)
Monocytes Relative: 6 % (ref 3–12)
Neutro Abs: 3.6 10*3/uL (ref 1.7–7.7)
Neutrophils Relative %: 53 % (ref 43–77)
Platelets: 269 10*3/uL (ref 150–400)
RBC: 4.43 MIL/uL (ref 3.87–5.11)
RDW: 14.7 % (ref 11.5–15.5)
WBC: 6.8 10*3/uL (ref 4.0–10.5)

## 2012-09-12 LAB — COMPREHENSIVE METABOLIC PANEL
ALT: 22 U/L (ref 0–35)
AST: 23 U/L (ref 0–37)
Albumin: 4.4 g/dL (ref 3.5–5.2)
Alkaline Phosphatase: 130 U/L — ABNORMAL HIGH (ref 39–117)
BUN: 17 mg/dL (ref 6–23)
CO2: 31 mEq/L (ref 19–32)
Calcium: 10.4 mg/dL (ref 8.4–10.5)
Chloride: 100 mEq/L (ref 96–112)
Creat: 0.86 mg/dL (ref 0.50–1.10)
Glucose, Bld: 88 mg/dL (ref 70–99)
Potassium: 3.7 mEq/L (ref 3.5–5.3)
Sodium: 141 mEq/L (ref 135–145)
Total Bilirubin: 0.8 mg/dL (ref 0.3–1.2)
Total Protein: 7.5 g/dL (ref 6.0–8.3)

## 2012-09-12 NOTE — Progress Notes (Signed)
Quick Note:  SENT LETTER TO PT ON LABS ______

## 2012-09-21 DIAGNOSIS — Z1231 Encounter for screening mammogram for malignant neoplasm of breast: Secondary | ICD-10-CM | POA: Diagnosis not present

## 2012-10-10 DIAGNOSIS — Q619 Cystic kidney disease, unspecified: Secondary | ICD-10-CM | POA: Diagnosis not present

## 2012-10-10 DIAGNOSIS — N200 Calculus of kidney: Secondary | ICD-10-CM | POA: Diagnosis not present

## 2012-10-15 DIAGNOSIS — K219 Gastro-esophageal reflux disease without esophagitis: Secondary | ICD-10-CM | POA: Diagnosis not present

## 2012-10-15 DIAGNOSIS — K5900 Constipation, unspecified: Secondary | ICD-10-CM | POA: Diagnosis not present

## 2012-10-15 DIAGNOSIS — K625 Hemorrhage of anus and rectum: Secondary | ICD-10-CM | POA: Diagnosis not present

## 2012-10-15 DIAGNOSIS — Z86010 Personal history of colonic polyps: Secondary | ICD-10-CM | POA: Diagnosis not present

## 2012-10-25 DIAGNOSIS — N200 Calculus of kidney: Secondary | ICD-10-CM | POA: Diagnosis not present

## 2012-10-25 DIAGNOSIS — Q619 Cystic kidney disease, unspecified: Secondary | ICD-10-CM | POA: Diagnosis not present

## 2012-11-13 ENCOUNTER — Other Ambulatory Visit: Payer: Self-pay

## 2012-11-16 ENCOUNTER — Other Ambulatory Visit: Payer: Medicare Other

## 2012-11-16 DIAGNOSIS — R799 Abnormal finding of blood chemistry, unspecified: Secondary | ICD-10-CM | POA: Diagnosis not present

## 2012-11-16 DIAGNOSIS — IMO0002 Reserved for concepts with insufficient information to code with codable children: Secondary | ICD-10-CM

## 2012-11-16 LAB — COMPREHENSIVE METABOLIC PANEL
Albumin: 4.1 g/dL (ref 3.5–5.2)
Alkaline Phosphatase: 110 U/L (ref 39–117)
BUN: 20 mg/dL (ref 6–23)
Creat: 0.93 mg/dL (ref 0.50–1.10)
Glucose, Bld: 86 mg/dL (ref 70–99)
Potassium: 4 mEq/L (ref 3.5–5.3)
Total Bilirubin: 0.6 mg/dL (ref 0.3–1.2)

## 2012-12-01 DIAGNOSIS — Z23 Encounter for immunization: Secondary | ICD-10-CM | POA: Diagnosis not present

## 2013-01-21 ENCOUNTER — Ambulatory Visit (INDEPENDENT_AMBULATORY_CARE_PROVIDER_SITE_OTHER): Payer: Medicare Other | Admitting: Family Medicine

## 2013-01-21 ENCOUNTER — Ambulatory Visit: Payer: Medicare Other

## 2013-01-21 VITALS — BP 108/68 | HR 99 | Temp 98.0°F | Resp 18 | Ht 61.0 in | Wt 203.0 lb

## 2013-01-21 DIAGNOSIS — R05 Cough: Secondary | ICD-10-CM

## 2013-01-21 DIAGNOSIS — J209 Acute bronchitis, unspecified: Secondary | ICD-10-CM

## 2013-01-21 DIAGNOSIS — R059 Cough, unspecified: Secondary | ICD-10-CM

## 2013-01-21 MED ORDER — HYDROCODONE-HOMATROPINE 5-1.5 MG/5ML PO SYRP: 5.0000 mL | ORAL_SOLUTION | Freq: Three times a day (TID) | ORAL | Status: DC | PRN

## 2013-01-21 MED ORDER — ALBUTEROL SULFATE HFA 108 (90 BASE) MCG/ACT IN AERS: 2.0000 | INHALATION_SPRAY | Freq: Four times a day (QID) | RESPIRATORY_TRACT | Status: DC | PRN

## 2013-01-21 MED ORDER — AZITHROMYCIN 250 MG PO TABS: ORAL_TABLET | ORAL | Status: DC

## 2013-01-21 NOTE — Patient Instructions (Signed)
Use Afrin nasal spray for 3 days, twice a day    Bronchitis Bronchitis is the body's way of reacting to injury and/or infection (inflammation) of the bronchi. Bronchi are the air tubes that extend from the windpipe into the lungs. If the inflammation becomes severe, it may cause shortness of breath. CAUSES  Inflammation may be caused by:  A virus.  Germs (bacteria).  Dust.  Allergens.  Pollutants and many other irritants. The cells lining the bronchial tree are covered with tiny hairs (cilia). These constantly beat upward, away from the lungs, toward the mouth. This keeps the lungs free of pollutants. When these cells become too irritated and are unable to do their job, mucus begins to develop. This causes the characteristic cough of bronchitis. The cough clears the lungs when the cilia are unable to do their job. Without either of these protective mechanisms, the mucus would settle in the lungs. Then you would develop pneumonia. Smoking is a common cause of bronchitis and can contribute to pneumonia. Stopping this habit is the single most important thing you can do to help yourself. TREATMENT   Your caregiver may prescribe an antibiotic if the cough is caused by bacteria. Also, medicines that open up your airways make it easier to breathe. Your caregiver may also recommend or prescribe an expectorant. It will loosen the mucus to be coughed up. Only take over-the-counter or prescription medicines for pain, discomfort, or fever as directed by your caregiver.  Removing whatever causes the problem (smoking, for example) is critical to preventing the problem from getting worse.  Cough suppressants may be prescribed for relief of cough symptoms.  Inhaled medicines may be prescribed to help with symptoms now and to help prevent problems from returning.  For those with recurrent (chronic) bronchitis, there may be a need for steroid medicines. SEEK IMMEDIATE MEDICAL CARE IF:   During  treatment, you develop more pus-like mucus (purulent sputum).  You have a fever.  You become progressively more ill.  You have increased difficulty breathing, wheezing, or shortness of breath. It is necessary to seek immediate medical care if you are elderly or sick from any other disease. MAKE SURE YOU:   Understand these instructions.  Will watch your condition.  Will get help right away if you are not doing well or get worse. Document Released: 02/07/2005 Document Revised: 10/10/2012 Document Reviewed: 10/02/2012 [facility] Patient Information 2014 [place], [place].

## 2013-01-21 NOTE — Progress Notes (Signed)
Patient ID: Margaret Reese MRN: [id number], DOB: 1945/06/05, 67 y.o. Date of Encounter: 01/21/2013, 8:45 AM  Primary Physician: Carollee Herter, MD  Chief Complaint:  Chief Complaint  Patient presents with  . Cough    x 4 days  . Epistaxis    in the mornings  . Chest spasm    HPI: 67 y.o. year old female presents with a 6 day history of nasal congestion, post nasal drip, sore throat, and cough. Mild sinus pressure. Afebrile. No chills. Nasal congestion thick and green/yellow. Cough is productive of green/yellow sputum and not associated with time of day. Ears feel full, leading to sensation of muffled hearing. Has tried OTC cold preps without success. No GI complaints.   No sick contacts, recent antibiotics, or recent travels.   No leg trauma, sedentary periods, h/o cancer, or tobacco use.  Past Medical History  Diagnosis Date  . Allergy   . Asthma   . Hypertension   . Retinal vein occlusion   . Obesity   . Renal stone   . GERD (gastroesophageal reflux disease)      Home Meds: Prior to Admission medications   Medication Sig Start Date End Date Taking? Authorizing Provider  aspirin 81 MG tablet Take 81 mg by mouth daily.     Yes Historical Provider, MD  Cholecalciferol (VITAMIN D3) 1000 UNITS CAPS Take by mouth.     Yes Historical Provider, MD  DiphenhydrAMINE HCl (BENADRYL ALLERGY PO) Take by mouth as needed.   Yes Historical Provider, MD  fish oil-omega-3 fatty acids 1000 MG capsule Take 2 g by mouth daily.     Yes Historical Provider, MD  lisinopril-hydrochlorothiazide (PRINZIDE,ZESTORETIC) 10-12.5 MG per tablet Take 1 tablet by mouth daily. 09/11/12  Yes Ronnald Nian, MD  multivitamin [facility]) per tablet Take 1 tablet by mouth daily.     Yes Historical Provider, MD  Naproxen Sodium (ALEVE PO) Take by mouth.   Yes Historical Provider, MD  potassium citrate (UROCIT-K) 5 MEQ (540 MG) SR tablet Take 1 tablet (5 mEq total) by mouth 2 (two) times daily. 08/10/11   Yes Ronnald Nian, MD  albuterol (VENTOLIN HFA) 108 (90 BASE) MCG/ACT inhaler Inhale 2 puffs into the lungs every 6 (six) hours as needed for wheezing. 09/11/12   Ronnald Nian, MD    Allergies: No Known Allergies  History   Social History  . Marital Status: Married    Spouse Name: N/A    Number of Children: N/A  . Years of Education: N/A   Occupational History  . Not on file.   Social History Main Topics  . Smoking status: Never Smoker   . Smokeless tobacco: Never Used  . Alcohol Use: No  . Drug Use: No  . Sexual Activity: Yes   Other Topics Concern  . Not on file   Social History Narrative  . No narrative on file     Review of Systems: Constitutional: negative for chills, fever, night sweats or weight changes Cardiovascular: negative for chest pain or palpitations Respiratory: negative for hemoptysis, wheezing, or shortness of breath Abdominal: negative for abdominal pain, nausea, vomiting or diarrhea Dermatological: negative for rash Neurologic: negative for headache   Physical Exam: Blood pressure 108/68, pulse 99, temperature 98 F (36.7 C), temperature source Oral, resp. rate 18, height 5\' 1"  (1.549 m), weight 203 lb (92.08 kg), SpO2 99.00%., Body mass index is 38.38 kg/(m^2). General: Well developed, well nourished, in no acute distress. Head: Normocephalic, atraumatic, eyes  without discharge, sclera non-icteric, nares are congested. Bilateral auditory canals clear, TM's are without perforation, pearly grey with reflective cone of light bilaterally. No sinus TTP. Oral cavity moist, dentition normal. Posterior pharynx with post nasal drip and mild erythema. No peritonsillar abscess or tonsillar exudate. Neck: Supple. No thyromegaly. Full ROM. No lymphadenopathy. Lungs: Coarse breath sounds bilaterally without wheezes, rales, or rhonchi. Breathing is unlabored.  Heart: RRR with S1 S2. No murmurs, rubs, or gallops appreciated. Msk:  Strength and tone normal for  age. Extremities: No clubbing or cyanosis. No edema. Neuro: Alert and oriented X 3. Moves all extremities spontaneously. CNII-XII grossly in tact. Psych:  Responds to questions appropriately with a normal affect.   Labs: [facility] reading (PRIMARY) by  Dr. Milus Glazier:  No infiltrate or cardiomegaly.    ASSESSMENT AND PLAN:  67 y.o. year old female with bronchitis. Cough - Plan: DG Chest 2 View, azithromycin (ZITHROMAX Z-PAK) 250 MG tablet, albuterol (VENTOLIN HFA) 108 (90 BASE) MCG/ACT inhaler, HYDROcodone-homatropine (HYCODAN) 5-1.5 MG/5ML syrup  Acute bronchitis - Plan: DG Chest 2 View, azithromycin (ZITHROMAX Z-PAK) 250 MG tablet, albuterol (VENTOLIN HFA) 108 (90 BASE) MCG/ACT inhaler, HYDROcodone-homatropine (HYCODAN) 5-1.5 MG/5ML syrup  Asthma exacerbation, allergic, mild intermittent - Plan: albuterol (VENTOLIN HFA) 108 (90 BASE) MCG/ACT inhaler   - -Mucinex -Tylenol/Motrin prn -Rest/fluids -RTC precautions -RTC 3-5 days if no improvement  Signed, Elvina Sidle, MD 01/21/2013 8:45 AM

## 2013-02-24 ENCOUNTER — Ambulatory Visit (INDEPENDENT_AMBULATORY_CARE_PROVIDER_SITE_OTHER): Payer: Medicare Other | Admitting: Internal Medicine

## 2013-02-24 VITALS — BP 126/78 | HR 98 | Temp 98.9°F | Resp 17 | Ht 61.0 in | Wt 206.0 lb

## 2013-02-24 DIAGNOSIS — R05 Cough: Secondary | ICD-10-CM | POA: Diagnosis not present

## 2013-02-24 DIAGNOSIS — J45909 Unspecified asthma, uncomplicated: Secondary | ICD-10-CM | POA: Diagnosis not present

## 2013-02-24 DIAGNOSIS — R059 Cough, unspecified: Secondary | ICD-10-CM | POA: Diagnosis not present

## 2013-02-24 DIAGNOSIS — R0602 Shortness of breath: Secondary | ICD-10-CM | POA: Diagnosis not present

## 2013-02-24 MED ORDER — FLUTICASONE-SALMETEROL 100-50 MCG/DOSE IN AEPB: 1.0000 | INHALATION_SPRAY | Freq: Two times a day (BID) | RESPIRATORY_TRACT | Status: DC

## 2013-02-24 NOTE — Patient Instructions (Signed)
Use albuterol at bedtime for 7 days Take Claritin or Zyrtec daily for 10 days Use Advair for one to 2 months twice a day Be sure reflux stays controlled

## 2013-02-24 NOTE — Progress Notes (Signed)
Subjective:    Patient ID: Margaret Reese, female    DOB: 24-Dec-1945, 68 y.o.   MRN: [id number]  HPI This chart was scribed for [facility], by Lovena Le Day, Scribe. This patient was seen in room 11 and the patient's care was started at 11:03 AM.  HPI Comments: Margaret Reese is a 68 y.o. female who presents to the [facility] w/hx of exercise induced asthma (takes daily advair for this problem) complaining of a constant, gradually worsened upper airway congestion, wheezing and SOB which is worse at PM, onset over past few weeks. She saw Dr. Joseph Art 12/1 and tx w/zithromax and albuterol which temporarily improved her chest congestion; she now states it feels her symptoms have moved to her upper airway. Her symptoms do not wake her up throughout the night while sleeping. She states unsure if her SOB is exertional. She reports associated sneezing and rhinorrhea. She denies any fever, change in appetite or chest congestion.   Patient Active Problem List   Diagnosis Date Noted  . Hypertension 07/08/2010  . Obesity, Class III, BMI 40-49.9 (morbid obesity) 07/08/2010  . GERD (gastroesophageal reflux disease) 07/08/2010  . Calcium oxalate renal stones 07/08/2010  . Hyperlipidemia 07/08/2010    Past Surgical History  Procedure Laterality Date  . Cholecystectomy    . Colonoscopy  2011    Family History  Problem Relation Age of Onset  . Depression Mother   . Asthma Brother     History   Social History  . Marital Status: Married    Spouse Name: N/A    Number of Children: N/A  . Years of Education: N/A   Occupational History  . Not on file.   Social History Main Topics  . Smoking status: Never Smoker   . Smokeless tobacco: Never Used  . Alcohol Use: No  . Drug Use: No  . Sexual Activity: Yes   Other Topics Concern  . Not on file   Social History Narrative  . No narrative on file    No Known Allergies  Results for orders placed in visit  on 11/16/12  COMPREHENSIVE METABOLIC PANEL      Result Value Range   Sodium 140  135 - 145 mEq/L   Potassium 4.0  3.5 - 5.3 mEq/L   Chloride 102  96 - 112 mEq/L   CO2 32  19 - 32 mEq/L   Glucose, Bld 86  70 - 99 mg/dL   BUN 20  6 - 23 mg/dL   Creat 0.93  0.50 - 1.10 mg/dL   Total Bilirubin 0.6  0.3 - 1.2 mg/dL   Alkaline Phosphatase 110  39 - 117 U/L   AST 19  0 - 37 U/L   ALT 15  0 - 35 U/L   Total Protein 6.9  6.0 - 8.3 g/dL   Albumin 4.1  3.5 - 5.2 g/dL   Calcium 10.1  8.4 - 10.5 mg/dL   Review of Systems  Constitutional: Negative for fever and chills.  HENT: Positive for congestion, rhinorrhea and sneezing.   Respiratory: Positive for cough, shortness of breath and wheezing.   Cardiovascular: Negative for chest pain.  Gastrointestinal: Negative for abdominal pain.  Musculoskeletal: Negative for back pain.  Skin: Negative for color change.   No GERD recently     Objective:   Physical Exam  Nursing note and vitals reviewed. Constitutional: She appears well-developed and well-nourished. No distress.  HENT:  Right Ear: External ear normal.  Left Ear: External ear normal.  Mouth/Throat: Oropharynx is clear and moist.  Clear rhinorrhea  Eyes: Conjunctivae are normal. Pupils are equal, round, and reactive to light.  Cardiovascular: Normal rate, regular rhythm and normal heart sounds.   Pulmonary/Chest: Effort normal. She has wheezes.  Wheezes bilaterally on forced expiration and no rales or rhonchi.   Lymphadenopathy:    She has no cervical adenopathy.    Triage Vitals: BP 126/78  Pulse 98  Temp(Src) 98.9 F (37.2 C) (Oral)  Resp 17  Ht 5\' 1"  (1.549 m)  Wt 206 lb (93.441 kg)  BMI 38.94 kg/m2  SpO2 96%  DIAGNOSTIC STUDIES: Oxygen Saturation is 96% on room air, adequate by my interpretation.       Assessment & Plan:  I have completed the patient encounter in its entirety as documented by the scribe, with editing by me where necessary. Robert P. Laney Pastor,  M.D.  SOB (shortness of breath)  RAD (reactive airway disease) - Plan: Fluticasone-Salmeterol (ADVAIR) 100-50 MCG/DOSE AEPB  Cough   Meds ordered this encounter  Medications  . Fluticasone-Salmeterol (ADVAIR) 100-50 MCG/DOSE AEPB    Sig: Inhale 1 puff into the lungs 2 (two) times daily.    Dispense:  1 each    Refill:  3

## 2013-05-17 DIAGNOSIS — H2510 Age-related nuclear cataract, unspecified eye: Secondary | ICD-10-CM | POA: Diagnosis not present

## 2013-05-17 DIAGNOSIS — H353190 Nonexudative age-related macular degeneration, unspecified eye, stage unspecified: Secondary | ICD-10-CM | POA: Diagnosis not present

## 2013-07-29 ENCOUNTER — Telehealth: Payer: Self-pay | Admitting: Family Medicine

## 2013-07-29 DIAGNOSIS — I10 Essential (primary) hypertension: Secondary | ICD-10-CM

## 2013-07-29 MED ORDER — LISINOPRIL-HYDROCHLOROTHIAZIDE 10-12.5 MG PO TABS: 1.0000 | ORAL_TABLET | Freq: Every day | ORAL | Status: DC

## 2013-07-29 NOTE — Telephone Encounter (Signed)
Needs refill on bp meds, she has med check plus scheduled for 09/02/13

## 2013-08-19 ENCOUNTER — Other Ambulatory Visit: Payer: Self-pay | Admitting: Family Medicine

## 2013-08-20 NOTE — Telephone Encounter (Signed)
Dr.lalonde is this okay 

## 2013-09-02 ENCOUNTER — Encounter: Payer: Self-pay | Admitting: Family Medicine

## 2013-09-02 ENCOUNTER — Ambulatory Visit (INDEPENDENT_AMBULATORY_CARE_PROVIDER_SITE_OTHER): Payer: Medicare Other | Admitting: Family Medicine

## 2013-09-02 VITALS — BP 120/74 | HR 80 | Ht 60.5 in | Wt 206.0 lb

## 2013-09-02 DIAGNOSIS — E785 Hyperlipidemia, unspecified: Secondary | ICD-10-CM | POA: Diagnosis not present

## 2013-09-02 DIAGNOSIS — I10 Essential (primary) hypertension: Secondary | ICD-10-CM | POA: Diagnosis not present

## 2013-09-02 DIAGNOSIS — K219 Gastro-esophageal reflux disease without esophagitis: Secondary | ICD-10-CM

## 2013-09-02 DIAGNOSIS — N200 Calculus of kidney: Secondary | ICD-10-CM

## 2013-09-02 DIAGNOSIS — Z79899 Other long term (current) drug therapy: Secondary | ICD-10-CM | POA: Diagnosis not present

## 2013-09-02 LAB — CBC WITH DIFFERENTIAL/PLATELET
Basophils Absolute: 0.1 10*3/uL (ref 0.0–0.1)
Basophils Relative: 1 % (ref 0–1)
Eosinophils Absolute: 0.3 10*3/uL (ref 0.0–0.7)
Eosinophils Relative: 4 % (ref 0–5)
HCT: 38.8 % (ref 36.0–46.0)
Hemoglobin: 13.4 g/dL (ref 12.0–15.0)
LYMPHS PCT: 39 % (ref 12–46)
Lymphs Abs: 2.7 10*3/uL (ref 0.7–4.0)
MCH: 29.1 pg (ref 26.0–34.0)
MCHC: 34.5 g/dL (ref 30.0–36.0)
MCV: 84.3 fL (ref 78.0–100.0)
MONO ABS: 0.3 10*3/uL (ref 0.1–1.0)
Monocytes Relative: 5 % (ref 3–12)
NEUTROS ABS: 3.5 10*3/uL (ref 1.7–7.7)
Neutrophils Relative %: 51 % (ref 43–77)
Platelets: 283 10*3/uL (ref 150–400)
RBC: 4.6 MIL/uL (ref 3.87–5.11)
RDW: 14.2 % (ref 11.5–15.5)
WBC: 6.9 10*3/uL (ref 4.0–10.5)

## 2013-09-02 NOTE — Progress Notes (Signed)
   Subjective:    Patient ID: Margaret Reese, female    DOB: February 14, 1946, 68 y.o.   MRN: [id number]  HPI For medication check. She continues on her blood pressure medication and is having no difficulty with this. She rarely uses her inhaler stating usually only about 3 times per year. She has not used Advair in quite some time. She does not have a good history for asthma. Her reflux disease is under good control. She does have calcium oxalate stones and is being followed by urology. She does occasionally use Aleve with good results. Her weight is relatively stable. She has been over 300 pounds at one point. She knows what needs to be done to lose weight and is essentially a holding pattern. Review of the record does show an elevated lipid panel. Family and social history was reviewed. Immunizations and health maintenance record was also reviewed. She has been there for 48 years.   Review of Systems  All other systems reviewed and are negative.      Objective:   Physical Exam BP 120/74  Pulse 80  Ht 5' 0.5" (1.537 m)  Wt 206 lb (93.441 kg)  BMI 39.55 kg/m2  General Appearance:    Alert, cooperative, no distress, appears stated age  Head:    Normocephalic, without obvious abnormality, atraumatic  Eyes:    PERRL, conjunctiva/corneas clear, EOM's intact, fundi    benign  Ears:    Normal TM's and external ear canals  Nose:   Nares normal, mucosa normal, no drainage or sinus   tenderness  Throat:   Lips, mucosa, and tongue normal; teeth and gums normal  Neck:   Supple, no lymphadenopathy;  thyroid:  no   enlargement/tenderness/nodules; no carotid   bruit or JVD  Back:    Spine nontender, no curvature, ROM normal, no CVA     tenderness  Lungs:     Clear to auscultation bilaterally without wheezes, rales or     ronchi; respirations unlabored  Chest Wall:    No tenderness or deformity   Heart:    Regular rate and rhythm, S1 and S2 normal, no murmur, rub   or gallop  Breast Exam:    Deferred  to GYN  Abdomen:     Soft, non-tender, nondistended, normoactive bowel sounds,    no masses, no hepatosplenomegaly  Genitalia:    Deferred to GYN     Extremities:   No clubbing, cyanosis or edema  Pulses:   2+ and symmetric all extremities  Skin:   Skin color, texture, turgor normal, no rashes or lesions  Lymph nodes:   Cervical, supraclavicular, and axillary nodes normal  Neurologic:   CNII-XII intact, normal strength, sensation and gait; reflexes 2+ and symmetric throughout          Psych:   Normal mood, affect, hygiene and grooming.          Assessment & Plan:  Obesity, Class III, BMI 40-49.9 (morbid obesity) - Plan: CBC with Differential, Comprehensive metabolic panel, Lipid panel  Essential hypertension - Plan: CBC with Differential, Comprehensive metabolic panel  Hyperlipidemia  Gastroesophageal reflux disease without esophagitis  Calcium oxalate renal stones  Encounter for long-term (current) use of other medications - Plan: CBC with Differential, Comprehensive metabolic panel, Lipid panel  I encouraged her to take good care of herself. Discussed Aleve in regard to risk versus benefit. She is rarely using it for aches and pains and encouraged him to continue to do that.

## 2013-09-03 LAB — COMPREHENSIVE METABOLIC PANEL
ALBUMIN: 4.6 g/dL (ref 3.5–5.2)
ALT: 16 U/L (ref 0–35)
AST: 20 U/L (ref 0–37)
Alkaline Phosphatase: 107 U/L (ref 39–117)
BUN: 21 mg/dL (ref 6–23)
CALCIUM: 10.3 mg/dL (ref 8.4–10.5)
CHLORIDE: 101 meq/L (ref 96–112)
CO2: 33 mEq/L — ABNORMAL HIGH (ref 19–32)
Creat: 0.79 mg/dL (ref 0.50–1.10)
GLUCOSE: 90 mg/dL (ref 70–99)
POTASSIUM: 4 meq/L (ref 3.5–5.3)
Sodium: 142 mEq/L (ref 135–145)
Total Bilirubin: 0.7 mg/dL (ref 0.2–1.2)
Total Protein: 7.4 g/dL (ref 6.0–8.3)

## 2013-09-03 LAB — LIPID PANEL
Cholesterol: 214 mg/dL — ABNORMAL HIGH (ref 0–200)
HDL: 50 mg/dL (ref >39)
LDL Cholesterol: 143 mg/dL — ABNORMAL HIGH (ref 0–99)
TRIGLYCERIDES: 107 mg/dL (ref <150)
Total CHOL/HDL Ratio: 4.3 Ratio
VLDL: 21 mg/dL (ref 0–40)

## 2013-10-18 DIAGNOSIS — Q619 Cystic kidney disease, unspecified: Secondary | ICD-10-CM | POA: Diagnosis not present

## 2013-10-18 DIAGNOSIS — N200 Calculus of kidney: Secondary | ICD-10-CM | POA: Diagnosis not present

## 2013-10-18 DIAGNOSIS — R3915 Urgency of urination: Secondary | ICD-10-CM | POA: Diagnosis not present

## 2013-10-18 DIAGNOSIS — N281 Cyst of kidney, acquired: Secondary | ICD-10-CM | POA: Diagnosis not present

## 2013-11-01 ENCOUNTER — Encounter: Payer: Self-pay | Admitting: Gastroenterology

## 2013-12-24 DIAGNOSIS — Z23 Encounter for immunization: Secondary | ICD-10-CM | POA: Diagnosis not present

## 2014-03-18 DIAGNOSIS — Z780 Asymptomatic menopausal state: Secondary | ICD-10-CM | POA: Diagnosis not present

## 2014-03-18 DIAGNOSIS — Z1231 Encounter for screening mammogram for malignant neoplasm of breast: Secondary | ICD-10-CM | POA: Diagnosis not present

## 2014-03-18 LAB — HM MAMMOGRAPHY

## 2014-03-18 LAB — HM DEXA SCAN: HM Dexa Scan: NORMAL

## 2014-03-19 ENCOUNTER — Encounter: Payer: Self-pay | Admitting: Internal Medicine

## 2014-03-31 ENCOUNTER — Encounter: Payer: Self-pay | Admitting: Internal Medicine

## 2014-05-25 DIAGNOSIS — M62838 Other muscle spasm: Secondary | ICD-10-CM | POA: Diagnosis not present

## 2014-06-03 ENCOUNTER — Encounter: Payer: Self-pay | Admitting: Gastroenterology

## 2014-06-17 DIAGNOSIS — S0086XA Insect bite (nonvenomous) of other part of head, initial encounter: Secondary | ICD-10-CM | POA: Diagnosis not present

## 2014-06-17 DIAGNOSIS — S1096XA Insect bite of unspecified part of neck, initial encounter: Secondary | ICD-10-CM | POA: Diagnosis not present

## 2014-06-17 DIAGNOSIS — S0006XA Insect bite (nonvenomous) of scalp, initial encounter: Secondary | ICD-10-CM | POA: Diagnosis not present

## 2014-07-08 DIAGNOSIS — H31091 Other chorioretinal scars, right eye: Secondary | ICD-10-CM | POA: Diagnosis not present

## 2014-07-08 DIAGNOSIS — H3531 Nonexudative age-related macular degeneration: Secondary | ICD-10-CM | POA: Diagnosis not present

## 2014-07-08 DIAGNOSIS — H34811 Central retinal vein occlusion, right eye: Secondary | ICD-10-CM | POA: Diagnosis not present

## 2014-07-08 DIAGNOSIS — H2513 Age-related nuclear cataract, bilateral: Secondary | ICD-10-CM | POA: Diagnosis not present

## 2014-08-19 ENCOUNTER — Other Ambulatory Visit: Payer: Self-pay | Admitting: Family Medicine

## 2014-09-01 DIAGNOSIS — Z86010 Personal history of colonic polyps: Secondary | ICD-10-CM | POA: Diagnosis not present

## 2014-09-01 DIAGNOSIS — K219 Gastro-esophageal reflux disease without esophagitis: Secondary | ICD-10-CM | POA: Diagnosis not present

## 2014-09-01 DIAGNOSIS — I10 Essential (primary) hypertension: Secondary | ICD-10-CM | POA: Diagnosis not present

## 2014-09-01 DIAGNOSIS — W19XXXA Unspecified fall, initial encounter: Secondary | ICD-10-CM | POA: Diagnosis not present

## 2014-09-04 ENCOUNTER — Encounter: Payer: Self-pay | Admitting: Family Medicine

## 2014-09-04 ENCOUNTER — Ambulatory Visit (INDEPENDENT_AMBULATORY_CARE_PROVIDER_SITE_OTHER): Payer: Medicare Other | Admitting: Family Medicine

## 2014-09-04 ENCOUNTER — Ambulatory Visit
Admission: RE | Admit: 2014-09-04 | Discharge: 2014-09-04 | Disposition: A | Discharge status: Home / Self Care | Payer: Medicare Other | Source: Ambulatory Visit | Attending: Family Medicine | Admitting: Family Medicine

## 2014-09-04 VITALS — BP 122/70 | HR 67 | Ht 61.5 in | Wt 206.0 lb

## 2014-09-04 DIAGNOSIS — J301 Allergic rhinitis due to pollen: Secondary | ICD-10-CM | POA: Diagnosis not present

## 2014-09-04 DIAGNOSIS — J4520 Mild intermittent asthma, uncomplicated: Secondary | ICD-10-CM | POA: Diagnosis not present

## 2014-09-04 DIAGNOSIS — E785 Hyperlipidemia, unspecified: Secondary | ICD-10-CM | POA: Diagnosis not present

## 2014-09-04 DIAGNOSIS — R202 Paresthesia of skin: Secondary | ICD-10-CM

## 2014-09-04 DIAGNOSIS — N200 Calculus of kidney: Secondary | ICD-10-CM

## 2014-09-04 DIAGNOSIS — Z9181 History of falling: Secondary | ICD-10-CM

## 2014-09-04 DIAGNOSIS — M542 Cervicalgia: Secondary | ICD-10-CM | POA: Diagnosis not present

## 2014-09-04 DIAGNOSIS — K219 Gastro-esophageal reflux disease without esophagitis: Secondary | ICD-10-CM | POA: Diagnosis not present

## 2014-09-04 DIAGNOSIS — E66813 Obesity, class 3: Secondary | ICD-10-CM

## 2014-09-04 DIAGNOSIS — Q6100 Congenital renal cyst, unspecified: Secondary | ICD-10-CM | POA: Diagnosis not present

## 2014-09-04 DIAGNOSIS — M25551 Pain in right hip: Secondary | ICD-10-CM

## 2014-09-04 DIAGNOSIS — I10 Essential (primary) hypertension: Secondary | ICD-10-CM

## 2014-09-04 DIAGNOSIS — M1611 Unilateral primary osteoarthritis, right hip: Secondary | ICD-10-CM | POA: Diagnosis not present

## 2014-09-04 DIAGNOSIS — R200 Anesthesia of skin: Secondary | ICD-10-CM | POA: Diagnosis not present

## 2014-09-04 DIAGNOSIS — N281 Cyst of kidney, acquired: Secondary | ICD-10-CM | POA: Insufficient documentation

## 2014-09-04 LAB — COMPREHENSIVE METABOLIC PANEL
ALT: 16 U/L (ref 0–35)
AST: 20 U/L (ref 0–37)
Albumin: 4.2 g/dL (ref 3.5–5.2)
Alkaline Phosphatase: 91 U/L (ref 39–117)
BUN: 20 mg/dL (ref 6–23)
CALCIUM: 9.9 mg/dL (ref 8.4–10.5)
CO2: 29 mEq/L (ref 19–32)
Chloride: 102 mEq/L (ref 96–112)
Creat: 0.8 mg/dL (ref 0.50–1.10)
Glucose, Bld: 94 mg/dL (ref 70–99)
Potassium: 3.8 mEq/L (ref 3.5–5.3)
Sodium: 144 mEq/L (ref 135–145)
Total Bilirubin: 0.9 mg/dL (ref 0.2–1.2)
Total Protein: 7.1 g/dL (ref 6.0–8.3)

## 2014-09-04 LAB — CBC WITH DIFFERENTIAL/PLATELET
Basophils Absolute: 0.1 10*3/uL (ref 0.0–0.1)
Basophils Relative: 1 % (ref 0–1)
EOS ABS: 0.2 10*3/uL (ref 0.0–0.7)
Eosinophils Relative: 4 % (ref 0–5)
HCT: 39.2 % (ref 36.0–46.0)
HEMOGLOBIN: 13.1 g/dL (ref 12.0–15.0)
LYMPHS ABS: 1.7 10*3/uL (ref 0.7–4.0)
Lymphocytes Relative: 33 % (ref 12–46)
MCH: 28.7 pg (ref 26.0–34.0)
MCHC: 33.4 g/dL (ref 30.0–36.0)
MCV: 85.8 fL (ref 78.0–100.0)
MPV: 9.9 fL (ref 8.6–12.4)
Monocytes Absolute: 0.3 10*3/uL (ref 0.1–1.0)
Monocytes Relative: 6 % (ref 3–12)
Neutro Abs: 3 10*3/uL (ref 1.7–7.7)
Neutrophils Relative %: 56 % (ref 43–77)
Platelets: 226 10*3/uL (ref 150–400)
RBC: 4.57 MIL/uL (ref 3.87–5.11)
RDW: 14.6 % (ref 11.5–15.5)
WBC: 5.3 10*3/uL (ref 4.0–10.5)

## 2014-09-04 LAB — LIPID PANEL
CHOL/HDL RATIO: 3.9 ratio
Cholesterol: 210 mg/dL — ABNORMAL HIGH (ref 0–200)
HDL: 54 mg/dL (ref >=46)
LDL CALC: 134 mg/dL — AB (ref 0–99)
Triglycerides: 108 mg/dL (ref <150)
VLDL: 22 mg/dL (ref 0–40)

## 2014-09-04 MED ORDER — ALBUTEROL SULFATE 108 (90 BASE) MCG/ACT IN AEPB: 2.0000 "application " | INHALATION_SPRAY | Freq: Four times a day (QID) | RESPIRATORY_TRACT | Status: DC | PRN

## 2014-09-04 NOTE — Progress Notes (Signed)
Subjective:    Patient ID: Margaret Reese, female    DOB: 1945-09-28, 69 y.o.   MRN: [id number]  HPI She is here for an interval evaluation. She was seen recently in an urgent care center after having difficulty with right neck pain. They apparently did x-rays which did show some arthritic changes. She is no longer having neck pain. She also had some right arm discomfort with this but again no symptoms at this time. She also did fall and states that she felt as if her left foot was bagging. She has not had difficulty with this and has been making a conscious effort to lift her feet. She does complain of tingling sensation in the left arm down into the fingertips but cannot distinguish whether all or just part of them. She also complains of right leg pain and notes this especially painful when she gets up from a chair. She feels it in the medial aspect of the thigh. The hip discomfort is interfering with her ability to exercise. She has a long history of difficulty with her eyes and does get yearly checkups. Apparently she has had no changes for several years. Cognitively she is doing well. She does see her urologist yearly for evaluation of the renal cyst. She has not had any difficulty with results stone in quite some time.She is no longer on Advair. Apparently she had difficulty with asthma several years ago but has not used Advair at least a year. She does note difficulty with asthma during the allergy season.He takes no medications for her allergies.   Review of Systems  All other systems reviewed and are negative.      Objective:   Physical Exam Alert and in no distress. EOMI. Cerebellar testing normal.Tympanic membranes and canals are normal. Pharyngeal area is normal. Neck is supple without adenopathy or thyromegaly. Cardiac exam shows a regular sinus rhythm without murmurs or gallops. Lungs are clear to auscultation. Right hip exam does show limitation of range of motion was some  discomfort with motion. Negative straight leg raising. Normal strength bilaterally in her lower extremities. Specifically no evidence of foot drop.DTRs difficult to determine but appeared normal. Upper extremity exam shows normal sensation strength and reflexes. Negative Tinel and Phalen's test on the left.        Assessment & Plan:  Right hip pain - Plan: DG HIP UNILAT WITH PELVIS 2-3 VIEWS RIGHT  Obesity, Class III, BMI 40-49.9 (morbid obesity) - Plan: CBC with Differential/Platelet, Comprehensive metabolic panel, Lipid panel  Essential hypertension - Plan: CBC with Differential/Platelet, Comprehensive metabolic panel  Hyperlipidemia - Plan: Lipid panel  Calcium oxalate renal stones  Gastroesophageal reflux disease without esophagitis  Neck pain on right side  Numbness and tingling in left arm  Renal cyst  History of recent fall  Allergic rhinitis due to pollen  Asthma, mild intermittent, uncomplicated - Plan: Albuterol Sulfate (PROAIR RESPICLICK) 470 (90 BASE) MCG/ACT AEPB The x-ray did show degenerative changes. I encouraged her to use Tylenol and then Aleve up to 2 twice per day. If continued difficulty with this, referral to orthopedics will be made. She is comfortable with that. Hopefully with the hip improving she can increase her physical activity. I did recommend getting involved in a general exercise program. She will follow-up with her your urologist.Cushion for Proventil given. No need at this time to place her back on Advair since she hasn't had any need for at least one year. She will keep track of when her  tingling sensation occurs in her arm and keep me informed concerning this. At this point no need to follow-up concerning her right neck pain since she is having no difficulty with this. Her allergies seem to be under good control and no medicine is needed.Over 15 minutes, greater than 50% spent in coordination of care and counseling

## 2014-09-04 NOTE — Patient Instructions (Signed)
Keep track of the tingling sensation in your arm and hands and see if wrist, elbow or shoulder or neck make a difference. Roughly get involved in a good exercise program

## 2014-09-11 ENCOUNTER — Ambulatory Visit (INDEPENDENT_AMBULATORY_CARE_PROVIDER_SITE_OTHER): Payer: Medicare Other | Admitting: Family Medicine

## 2014-09-11 ENCOUNTER — Encounter: Payer: Self-pay | Admitting: Family Medicine

## 2014-09-11 VITALS — BP 114/68 | HR 60 | Ht 62.0 in | Wt 209.0 lb

## 2014-09-11 DIAGNOSIS — M1990 Unspecified osteoarthritis, unspecified site: Secondary | ICD-10-CM | POA: Insufficient documentation

## 2014-09-11 DIAGNOSIS — J4520 Mild intermittent asthma, uncomplicated: Secondary | ICD-10-CM | POA: Diagnosis not present

## 2014-09-11 DIAGNOSIS — M1611 Unilateral primary osteoarthritis, right hip: Secondary | ICD-10-CM | POA: Insufficient documentation

## 2014-09-11 DIAGNOSIS — I10 Essential (primary) hypertension: Secondary | ICD-10-CM

## 2014-09-11 NOTE — Progress Notes (Signed)
   Subjective:    Patient ID: Margaret Reese, female    DOB: 12-12-1945, 69 y.o.   MRN: [id number]  HPI She is here for a medication check. Continues have difficulty with her right hip. Recent x-rays did show arthritic changes. She is not taking Aleve and getting roughly 60-70% benefit with to twice a day. She has started an exercise program and does feel good about this. Her husband is also joining her with this. She continues on medications listed in the chart. He does have a history of reactive airways disease and is doing quite well on her present medication regimen. She does use albuterol as needed. She also continues on Advair.   Review of Systems     Objective:   Physical Exam Alert and in no distress otherwise not examined       Assessment & Plan:  Arthritis  Arthritis of right hip  Obesity, Class III, BMI 40-49.9 (morbid obesity)  Essential hypertension  RAD (reactive airway disease), mild intermittent, uncomplicated  I reinforced the need for her to continue with exercise and range of motion and strengthening for her hip. She will continue on Aleve. Discussed hip replacement and when that would be appropriate. Explained that as long as she can function and do the things she would like, there is no need for hip replacement but to strongly consider that if her life is affected. She will continue to work on this and weight loss. I discussed the benefits of weight loss in regard to her hips, knees, risk for diabetes etc. Over 25 minutes, greater than 50% spent in [place] of care and counseling.

## 2014-10-14 DIAGNOSIS — Z86010 Personal history of colonic polyps: Secondary | ICD-10-CM | POA: Diagnosis not present

## 2014-10-14 DIAGNOSIS — K5730 Diverticulosis of large intestine without perforation or abscess without bleeding: Secondary | ICD-10-CM | POA: Diagnosis not present

## 2014-10-14 LAB — HM COLONOSCOPY

## 2014-10-23 DIAGNOSIS — N200 Calculus of kidney: Secondary | ICD-10-CM | POA: Diagnosis not present

## 2014-10-23 DIAGNOSIS — R3915 Urgency of urination: Secondary | ICD-10-CM | POA: Diagnosis not present

## 2014-10-23 DIAGNOSIS — N281 Cyst of kidney, acquired: Secondary | ICD-10-CM | POA: Diagnosis not present

## 2014-11-11 ENCOUNTER — Encounter: Payer: Self-pay | Admitting: Gastroenterology

## 2014-11-15 ENCOUNTER — Other Ambulatory Visit: Payer: Self-pay | Admitting: Family Medicine

## 2014-12-08 DIAGNOSIS — Z23 Encounter for immunization: Secondary | ICD-10-CM | POA: Diagnosis not present

## 2014-12-11 ENCOUNTER — Encounter: Payer: Self-pay | Admitting: Family Medicine

## 2014-12-11 ENCOUNTER — Ambulatory Visit (INDEPENDENT_AMBULATORY_CARE_PROVIDER_SITE_OTHER): Payer: Medicare Other | Admitting: Family Medicine

## 2014-12-11 VITALS — Ht 62.0 in | Wt 211.0 lb

## 2014-12-11 DIAGNOSIS — M1990 Unspecified osteoarthritis, unspecified site: Secondary | ICD-10-CM | POA: Diagnosis not present

## 2014-12-11 DIAGNOSIS — M1611 Unilateral primary osteoarthritis, right hip: Secondary | ICD-10-CM

## 2014-12-11 NOTE — Progress Notes (Signed)
   Subjective:    Patient ID: Margaret Reese, female    DOB: Apr 03, 1945, 69 y.o.   MRN: [id number]  HPI she is here for consult concerning continued right hip pain. He has been involved in exercise program and taking an anti-inflammatory. It is now interfering with the quality of her life in that she is starting to limit her physical activities.  Review of Systems     Objective:   Physical Exam Alert and in no distress. The x-ray was reviewed with her again.       Assessment & Plan:  Arthritis of right hip  I showed her her x-ray and the fact that she has significant arthritic changes present. I discussed possible injection versus surgery. I strongly encouraged her to get the surgery done so she can get on with her life. Explained that this is relatively quick and easy.

## 2014-12-22 DIAGNOSIS — M25551 Pain in right hip: Secondary | ICD-10-CM | POA: Diagnosis not present

## 2014-12-22 DIAGNOSIS — M1611 Unilateral primary osteoarthritis, right hip: Secondary | ICD-10-CM | POA: Diagnosis not present

## 2015-01-19 ENCOUNTER — Other Ambulatory Visit (HOSPITAL_COMMUNITY): Payer: Self-pay | Admitting: Orthopaedic Surgery

## 2015-01-22 ENCOUNTER — Encounter (HOSPITAL_COMMUNITY): Payer: Self-pay

## 2015-01-22 ENCOUNTER — Encounter (HOSPITAL_COMMUNITY)
Admission: RE | Admit: 2015-01-22 | Discharge: 2015-01-22 | Disposition: A | Discharge status: Home / Self Care | Payer: Medicare Other | Source: Ambulatory Visit | Attending: Orthopaedic Surgery | Admitting: Orthopaedic Surgery

## 2015-01-22 DIAGNOSIS — E7800 Pure hypercholesterolemia, unspecified: Secondary | ICD-10-CM | POA: Diagnosis not present

## 2015-01-22 DIAGNOSIS — Z01818 Encounter for other preprocedural examination: Secondary | ICD-10-CM | POA: Insufficient documentation

## 2015-01-22 DIAGNOSIS — I10 Essential (primary) hypertension: Secondary | ICD-10-CM | POA: Diagnosis not present

## 2015-01-22 DIAGNOSIS — M1611 Unilateral primary osteoarthritis, right hip: Secondary | ICD-10-CM | POA: Diagnosis not present

## 2015-01-22 DIAGNOSIS — Z01810 Encounter for preprocedural cardiovascular examination: Secondary | ICD-10-CM | POA: Diagnosis not present

## 2015-01-22 DIAGNOSIS — R9431 Abnormal electrocardiogram [ECG] [EKG]: Secondary | ICD-10-CM | POA: Diagnosis not present

## 2015-01-22 HISTORY — DX: Unspecified cataract: H26.9

## 2015-01-22 HISTORY — DX: Adverse effect of unspecified anesthetic, initial encounter: T41.45XA

## 2015-01-22 HISTORY — DX: Repeated falls: R29.6

## 2015-01-22 HISTORY — DX: Paresthesia of skin: R20.2

## 2015-01-22 HISTORY — DX: Other complications of anesthesia, initial encounter: T88.59XA

## 2015-01-22 HISTORY — DX: Personal history of colon polyps: Z86.010

## 2015-01-22 HISTORY — DX: Frequency of micturition: R35.0

## 2015-01-22 HISTORY — DX: Other abnormalities of gait and mobility: R26.89

## 2015-01-22 HISTORY — DX: Unspecified fall, initial encounter: W19.XXXA

## 2015-01-22 HISTORY — DX: Unspecified osteoarthritis, unspecified site: M19.90

## 2015-01-22 HISTORY — DX: Personal history of other diseases of the respiratory system: Z87.09

## 2015-01-22 HISTORY — DX: Legal blindness, as defined in USA: H54.8

## 2015-01-22 HISTORY — DX: Cyst of kidney, acquired: N28.1

## 2015-01-22 HISTORY — DX: Personal history of other (healed) physical injury and trauma: Z87.828

## 2015-01-22 HISTORY — DX: Gastric ulcer, unspecified as acute or chronic, without hemorrhage or perforation: K25.9

## 2015-01-22 HISTORY — PX: CARDIOVASCULAR STRESS TEST: SHX262

## 2015-01-22 HISTORY — DX: Personal history of colon polyps, unspecified: Z86.0100

## 2015-01-22 LAB — SURGICAL PCR SCREEN
MRSA, PCR: NEGATIVE
STAPHYLOCOCCUS AUREUS: NEGATIVE

## 2015-01-22 LAB — BASIC METABOLIC PANEL
ANION GAP: 8 (ref 5–15)
BUN: 23 mg/dL — ABNORMAL HIGH (ref 6–20)
CALCIUM: 10.5 mg/dL — AB (ref 8.9–10.3)
CO2: 31 mmol/L (ref 22–32)
CREATININE: 0.83 mg/dL (ref 0.44–1.00)
Chloride: 101 mmol/L (ref 101–111)
Glucose, Bld: 95 mg/dL (ref 65–99)
Potassium: 3.8 mmol/L (ref 3.5–5.1)
SODIUM: 140 mmol/L (ref 135–145)

## 2015-01-22 LAB — CBC
HCT: 39 % (ref 36.0–46.0)
Hemoglobin: 12.9 g/dL (ref 12.0–15.0)
MCH: 28.5 pg (ref 26.0–34.0)
MCHC: 33.1 g/dL (ref 30.0–36.0)
MCV: 86.3 fL (ref 78.0–100.0)
PLATELETS: 217 10*3/uL (ref 150–400)
RBC: 4.52 MIL/uL (ref 3.87–5.11)
RDW: 13.2 % (ref 11.5–15.5)
WBC: 7.2 10*3/uL (ref 4.0–10.5)

## 2015-01-22 LAB — PROTIME-INR
INR: 1.06 (ref 0.00–1.49)
PROTHROMBIN TIME: 14 s (ref 11.6–15.2)

## 2015-01-22 LAB — APTT: APTT: 40 s — AB (ref 24–37)

## 2015-01-22 LAB — ABO/RH: ABO/RH(D): O POS

## 2015-01-22 NOTE — Progress Notes (Signed)
Dr Marcie Bal / Anesthesia reviewed pts EKG/epic per PAT visit 01/22/2015. Requested pt to have cardiac clearance prior to surgery date. Contacted Dr C Blackman's scheduler in regards to situation. Spoke with

## 2015-01-22 NOTE — Patient Instructions (Signed)
Margaret Reese  01/22/2015   Your procedure is scheduled on: Friday January 30, 2015   Report to [facility] Main  Entrance take [place]  elevators to 3rd floor to  [place] at 7:45 AM.  Call this number if you have problems the morning of surgery [phone]   Remember: ONLY 1 PERSON MAY GO WITH YOU TO SHORT STAY TO GET  READY MORNING OF [place].  Do not eat food or drink liquids :After Midnight.     Take these medicines the morning of surgery: May use albuterol inhaler if needed (bring with you day of surgery)                               You may not have any metal on your body including hair pins and              piercings  Do not wear jewelry, make-up, lotions, powders or perfumes, deodorant             Do not wear nail polish.  Do not shave  48 hours prior to surgery.              Do not bring valuables to the hospital. [place].  Contacts, dentures or bridgework may not be worn into surgery.  Leave suitcase in the car. After surgery it may be brought to your room.                Please read over the following fact sheets you were given:MRSA INFORMATION SHEET  _____________________________________________________________________             [facility] - Preparing for Surgery Before surgery, you can play an important role.  Because skin is not sterile, your skin needs to be as free of germs as possible.  You can reduce the number of germs on your skin by washing with CHG (chlorahexidine gluconate) soap before surgery.  CHG is an antiseptic cleaner which kills germs and bonds with the skin to continue killing germs even after washing. Please DO NOT use if you have an allergy to CHG or antibacterial soaps.  If your skin becomes reddened/irritated stop using the CHG and inform your nurse when you arrive at Short Stay. Do not shave (including legs and underarms) for at least 48 hours prior to  the first CHG shower.  You may shave your face/neck. Please follow these instructions carefully:  1.  Shower with CHG Soap the night before surgery and the  morning of Surgery.  2.  If you choose to wash your hair, wash your hair first as usual with your  normal  shampoo.  3.  After you shampoo, rinse your hair and body thoroughly to remove the  shampoo.                           4.  Use CHG as you would any other liquid soap.  You can apply chg directly  to the skin and wash                       Gently with a scrungie or clean washcloth.  5.  Apply the  CHG Soap to your body ONLY FROM THE NECK DOWN.   Do not use on face/ open                           Wound or open sores. Avoid contact with eyes, ears mouth and genitals (private parts).                       Wash face,  Genitals (private parts) with your normal soap.             6.  Wash thoroughly, paying special attention to the area where your surgery  will be performed.  7.  Thoroughly rinse your body with warm water from the neck down.  8.  DO NOT shower/wash with your normal soap after using and rinsing off  the CHG Soap.                9.  Pat yourself dry with a clean towel.            10.  Wear clean pajamas.            11.  Place clean sheets on your bed the night of your first shower and do not  sleep with pets. Day of Surgery : Do not apply any lotions/deodorants the morning of surgery.  Please wear clean clothes to the hospital/surgery center.  FAILURE TO FOLLOW THESE INSTRUCTIONS MAY RESULT IN THE CANCELLATION OF YOUR SURGERY PATIENT SIGNATURE_________________________________  NURSE SIGNATURE__________________________________  ________________________________________________________________________

## 2015-01-22 NOTE — Progress Notes (Signed)
BMP results per epic per PAT visit 01/22/2015 sent to Dr Kathrynn Speed

## 2015-01-22 NOTE — Progress Notes (Signed)
Your patient has screened at an elevated risk for Obstructive Sleep Apnea using the Stop-Bang Tool during a pre-surgical visit. Pt scored at high risk.  

## 2015-01-23 DIAGNOSIS — R9431 Abnormal electrocardiogram [ECG] [EKG]: Secondary | ICD-10-CM | POA: Diagnosis not present

## 2015-01-23 DIAGNOSIS — Z01810 Encounter for preprocedural cardiovascular examination: Secondary | ICD-10-CM | POA: Diagnosis not present

## 2015-01-28 NOTE — Progress Notes (Signed)
Received LOV note Dr Jackalyn Lombard NP 01/22/15, surgical clearance with ekg 01/22/15 and nuclear stress test 01/23/15  Placed on chart

## 2015-01-30 ENCOUNTER — Inpatient Hospital Stay (HOSPITAL_COMMUNITY): Payer: Medicare Other

## 2015-01-30 ENCOUNTER — Inpatient Hospital Stay (HOSPITAL_COMMUNITY): Payer: Medicare Other | Admitting: Registered Nurse

## 2015-01-30 ENCOUNTER — Encounter (HOSPITAL_COMMUNITY): Payer: Self-pay | Admitting: *Deleted

## 2015-01-30 ENCOUNTER — Encounter (HOSPITAL_COMMUNITY)
Admission: RE | Disposition: A | Discharge status: Home Health | Payer: Self-pay | Source: Ambulatory Visit | Attending: Orthopaedic Surgery

## 2015-01-30 ENCOUNTER — Inpatient Hospital Stay (HOSPITAL_COMMUNITY)
Admission: RE | Admit: 2015-01-30 | Discharge: 2015-02-02 | DRG: 464 | Disposition: A | Discharge status: Home Health | Payer: Medicare Other | Source: Ambulatory Visit | Attending: Orthopaedic Surgery | Admitting: Orthopaedic Surgery

## 2015-01-30 DIAGNOSIS — H5441 Blindness, right eye, normal vision left eye: Secondary | ICD-10-CM | POA: Diagnosis present

## 2015-01-30 DIAGNOSIS — M169 Osteoarthritis of hip, unspecified: Secondary | ICD-10-CM | POA: Diagnosis not present

## 2015-01-30 DIAGNOSIS — K219 Gastro-esophageal reflux disease without esophagitis: Secondary | ICD-10-CM | POA: Diagnosis present

## 2015-01-30 DIAGNOSIS — J4520 Mild intermittent asthma, uncomplicated: Secondary | ICD-10-CM | POA: Diagnosis present

## 2015-01-30 DIAGNOSIS — M25551 Pain in right hip: Secondary | ICD-10-CM | POA: Diagnosis not present

## 2015-01-30 DIAGNOSIS — D171 Benign lipomatous neoplasm of skin and subcutaneous tissue of trunk: Secondary | ICD-10-CM | POA: Diagnosis not present

## 2015-01-30 DIAGNOSIS — Z419 Encounter for procedure for purposes other than remedying health state, unspecified: Secondary | ICD-10-CM

## 2015-01-30 DIAGNOSIS — Z01812 Encounter for preprocedural laboratory examination: Secondary | ICD-10-CM

## 2015-01-30 DIAGNOSIS — I10 Essential (primary) hypertension: Secondary | ICD-10-CM | POA: Diagnosis not present

## 2015-01-30 DIAGNOSIS — D1723 Benign lipomatous neoplasm of skin and subcutaneous tissue of right leg: Secondary | ICD-10-CM | POA: Diagnosis present

## 2015-01-30 DIAGNOSIS — Z471 Aftercare following joint replacement surgery: Secondary | ICD-10-CM | POA: Diagnosis not present

## 2015-01-30 DIAGNOSIS — Z7982 Long term (current) use of aspirin: Secondary | ICD-10-CM | POA: Diagnosis not present

## 2015-01-30 DIAGNOSIS — Z6839 Body mass index (BMI) 39.0-39.9, adult: Secondary | ICD-10-CM | POA: Diagnosis not present

## 2015-01-30 DIAGNOSIS — Z96641 Presence of right artificial hip joint: Secondary | ICD-10-CM

## 2015-01-30 DIAGNOSIS — Z79899 Other long term (current) drug therapy: Secondary | ICD-10-CM

## 2015-01-30 DIAGNOSIS — D62 Acute posthemorrhagic anemia: Secondary | ICD-10-CM | POA: Diagnosis not present

## 2015-01-30 DIAGNOSIS — M1611 Unilateral primary osteoarthritis, right hip: Secondary | ICD-10-CM | POA: Diagnosis not present

## 2015-01-30 DIAGNOSIS — R222 Localized swelling, mass and lump, trunk: Secondary | ICD-10-CM | POA: Diagnosis not present

## 2015-01-30 HISTORY — PX: TOTAL HIP ARTHROPLASTY: SHX124

## 2015-01-30 SURGERY — ARTHROPLASTY, HIP, TOTAL, ANTERIOR APPROACH
Anesthesia: Spinal | Site: Hip | Laterality: Right

## 2015-01-30 MED ORDER — CEFAZOLIN SODIUM-DEXTROSE 2-3 GM-% IV SOLR
2.0000 g | Freq: Four times a day (QID) | INTRAVENOUS | Status: AC
  Administered 2015-01-30 (×2): 2 g via INTRAVENOUS
  Filled 2015-01-30 (×2): qty 50

## 2015-01-30 MED ORDER — SODIUM CHLORIDE 0.9 % IV SOLN
INTRAVENOUS | Status: DC
  Administered 2015-01-30: 13:00:00 via INTRAVENOUS

## 2015-01-30 MED ORDER — ALBUTEROL SULFATE (2.5 MG/3ML) 0.083% IN NEBU: 2.5000 mg | INHALATION_SOLUTION | Freq: Four times a day (QID) | RESPIRATORY_TRACT | Status: DC | PRN

## 2015-01-30 MED ORDER — LACTATED RINGERS IV SOLN
INTRAVENOUS | Status: DC | PRN
  Administered 2015-01-30 (×3): via INTRAVENOUS

## 2015-01-30 MED ORDER — BUPIVACAINE IN DEXTROSE 0.75-8.25 % IT SOLN
INTRATHECAL | Status: DC | PRN
  Administered 2015-01-30: 1.8 mL via INTRATHECAL

## 2015-01-30 MED ORDER — PHENYLEPHRINE HCL 10 MG/ML IJ SOLN
10.0000 mg | INTRAVENOUS | Status: DC | PRN
  Administered 2015-01-30: 30 ug/min via INTRAVENOUS

## 2015-01-30 MED ORDER — DOCUSATE SODIUM 100 MG PO CAPS
100.0000 mg | ORAL_CAPSULE | Freq: Two times a day (BID) | ORAL | Status: DC
  Administered 2015-01-30 – 2015-02-02 (×6): 100 mg via ORAL

## 2015-01-30 MED ORDER — MIDAZOLAM HCL 5 MG/5ML IJ SOLN
INTRAMUSCULAR | Status: DC | PRN
  Administered 2015-01-30 (×2): 1 mg via INTRAVENOUS

## 2015-01-30 MED ORDER — LACTATED RINGERS IV SOLN
INTRAVENOUS | Status: DC
  Administered 2015-01-30: 1000 mL via INTRAVENOUS

## 2015-01-30 MED ORDER — PHENYLEPHRINE HCL 10 MG/ML IJ SOLN
INTRAMUSCULAR | Status: DC | PRN
  Administered 2015-01-30: 120 ug via INTRAVENOUS
  Administered 2015-01-30: 80 ug via INTRAVENOUS

## 2015-01-30 MED ORDER — PROPOFOL 10 MG/ML IV BOLUS
INTRAVENOUS | Status: AC
  Filled 2015-01-30: qty 60

## 2015-01-30 MED ORDER — METHOCARBAMOL 1000 MG/10ML IJ SOLN
500.0000 mg | Freq: Four times a day (QID) | INTRAMUSCULAR | Status: DC | PRN
  Administered 2015-01-30: 500 mg via INTRAVENOUS
  Filled 2015-01-30 (×2): qty 5

## 2015-01-30 MED ORDER — DIPHENHYDRAMINE HCL 12.5 MG/5ML PO ELIX: 12.5000 mg | ORAL_SOLUTION | ORAL | Status: DC | PRN

## 2015-01-30 MED ORDER — FENTANYL CITRATE (PF) 100 MCG/2ML IJ SOLN
INTRAMUSCULAR | Status: AC
  Filled 2015-01-30: qty 2

## 2015-01-30 MED ORDER — SODIUM CHLORIDE 0.9 % IR SOLN
Status: DC | PRN
  Administered 2015-01-30: 1000 mL

## 2015-01-30 MED ORDER — POTASSIUM CITRATE ER 10 MEQ (1080 MG) PO TBCR
10.0000 meq | EXTENDED_RELEASE_TABLET | Freq: Every day | ORAL | Status: DC
  Administered 2015-01-30 – 2015-02-01 (×3): 10 meq via ORAL
  Filled 2015-01-30 (×4): qty 1

## 2015-01-30 MED ORDER — POTASSIUM CITRATE ER 5 MEQ (540 MG) PO TBCR: 5.0000 meq | EXTENDED_RELEASE_TABLET | Freq: Two times a day (BID) | ORAL | Status: DC

## 2015-01-30 MED ORDER — METHOCARBAMOL 500 MG PO TABS
500.0000 mg | ORAL_TABLET | Freq: Four times a day (QID) | ORAL | Status: DC | PRN
  Administered 2015-01-31 – 2015-02-01 (×2): 500 mg via ORAL
  Filled 2015-01-30 (×2): qty 1

## 2015-01-30 MED ORDER — PROPOFOL 10 MG/ML IV BOLUS
INTRAVENOUS | Status: DC | PRN
  Administered 2015-01-30 (×3): 30 mg via INTRAVENOUS

## 2015-01-30 MED ORDER — METOCLOPRAMIDE HCL 10 MG PO TABS
5.0000 mg | ORAL_TABLET | Freq: Three times a day (TID) | ORAL | Status: DC | PRN
Start: 2015-01-30 — End: 2015-02-02

## 2015-01-30 MED ORDER — CEFAZOLIN SODIUM-DEXTROSE 2-3 GM-% IV SOLR
INTRAVENOUS | Status: AC
  Filled 2015-01-30: qty 50

## 2015-01-30 MED ORDER — HYDROMORPHONE HCL 1 MG/ML IJ SOLN: 1.0000 mg | INTRAMUSCULAR | Status: DC | PRN

## 2015-01-30 MED ORDER — PROPOFOL 10 MG/ML IV BOLUS
INTRAVENOUS | Status: AC
Start: 2015-01-30 — End: 2015-01-30
  Filled 2015-01-30: qty 20

## 2015-01-30 MED ORDER — ASPIRIN EC 325 MG PO TBEC
325.0000 mg | DELAYED_RELEASE_TABLET | Freq: Two times a day (BID) | ORAL | Status: DC
  Administered 2015-01-30 – 2015-02-02 (×6): 325 mg via ORAL
  Filled 2015-01-30 (×8): qty 1

## 2015-01-30 MED ORDER — PHENYLEPHRINE HCL 10 MG/ML IJ SOLN
INTRAMUSCULAR | Status: AC
  Filled 2015-01-30: qty 1

## 2015-01-30 MED ORDER — FENTANYL CITRATE (PF) 100 MCG/2ML IJ SOLN
INTRAMUSCULAR | Status: AC
Start: 2015-01-30 — End: 2015-01-31
  Filled 2015-01-30: qty 2

## 2015-01-30 MED ORDER — FENTANYL CITRATE (PF) 100 MCG/2ML IJ SOLN
INTRAMUSCULAR | Status: DC | PRN
  Administered 2015-01-30 (×2): 50 ug via INTRAVENOUS

## 2015-01-30 MED ORDER — ONDANSETRON HCL 4 MG PO TABS
4.0000 mg | ORAL_TABLET | Freq: Four times a day (QID) | ORAL | Status: DC | PRN
  Filled 2015-01-30: qty 1

## 2015-01-30 MED ORDER — PHENYLEPHRINE 40 MCG/ML (10ML) SYRINGE FOR IV PUSH (FOR BLOOD PRESSURE SUPPORT)
PREFILLED_SYRINGE | INTRAVENOUS | Status: AC
  Filled 2015-01-30: qty 10

## 2015-01-30 MED ORDER — ONDANSETRON HCL 4 MG/2ML IJ SOLN
INTRAMUSCULAR | Status: AC
  Filled 2015-01-30: qty 2

## 2015-01-30 MED ORDER — METOCLOPRAMIDE HCL 5 MG/ML IJ SOLN: 5.0000 mg | Freq: Three times a day (TID) | INTRAMUSCULAR | Status: DC | PRN

## 2015-01-30 MED ORDER — ONDANSETRON HCL 4 MG/2ML IJ SOLN
INTRAMUSCULAR | Status: DC | PRN
  Administered 2015-01-30: 4 mg via INTRAVENOUS

## 2015-01-30 MED ORDER — 0.9 % SODIUM CHLORIDE (POUR BTL) OPTIME
TOPICAL | Status: DC | PRN
  Administered 2015-01-30: 1000 mL

## 2015-01-30 MED ORDER — CEFAZOLIN SODIUM-DEXTROSE 2-3 GM-% IV SOLR
2.0000 g | INTRAVENOUS | Status: AC
  Administered 2015-01-30: 2 g via INTRAVENOUS

## 2015-01-30 MED ORDER — TRANEXAMIC ACID 1000 MG/10ML IV SOLN
1000.0000 mg | INTRAVENOUS | Status: AC
  Administered 2015-01-30: 1000 mg via INTRAVENOUS
  Filled 2015-01-30: qty 10

## 2015-01-30 MED ORDER — PHENOL 1.4 % MT LIQD
1.0000 | OROMUCOSAL | Status: DC | PRN
Start: 2015-01-30 — End: 2015-02-02

## 2015-01-30 MED ORDER — KETOROLAC TROMETHAMINE 15 MG/ML IJ SOLN
7.5000 mg | Freq: Four times a day (QID) | INTRAMUSCULAR | Status: AC
  Administered 2015-01-30 – 2015-01-31 (×4): 7.5 mg via INTRAVENOUS
  Filled 2015-01-30 (×4): qty 1

## 2015-01-30 MED ORDER — MENTHOL 3 MG MT LOZG: 1.0000 | LOZENGE | OROMUCOSAL | Status: DC | PRN

## 2015-01-30 MED ORDER — ZOLPIDEM TARTRATE 5 MG PO TABS: 5.0000 mg | ORAL_TABLET | Freq: Every evening | ORAL | Status: DC | PRN

## 2015-01-30 MED ORDER — ACETAMINOPHEN 10 MG/ML IV SOLN
INTRAVENOUS | Status: DC | PRN
  Administered 2015-01-30: 1000 mg via INTRAVENOUS

## 2015-01-30 MED ORDER — DEXAMETHASONE SODIUM PHOSPHATE 10 MG/ML IJ SOLN
INTRAMUSCULAR | Status: AC
  Filled 2015-01-30: qty 1

## 2015-01-30 MED ORDER — DEXAMETHASONE SODIUM PHOSPHATE 10 MG/ML IJ SOLN
INTRAMUSCULAR | Status: DC | PRN
  Administered 2015-01-30: 10 mg via INTRAVENOUS

## 2015-01-30 MED ORDER — SIMVASTATIN 20 MG PO TABS
20.0000 mg | ORAL_TABLET | Freq: Every day | ORAL | Status: DC
  Administered 2015-01-30 – 2015-02-01 (×3): 20 mg via ORAL
  Filled 2015-01-30 (×4): qty 1

## 2015-01-30 MED ORDER — FENTANYL CITRATE (PF) 100 MCG/2ML IJ SOLN
25.0000 ug | INTRAMUSCULAR | Status: DC | PRN
  Administered 2015-01-30 (×3): 50 ug via INTRAVENOUS

## 2015-01-30 MED ORDER — ONDANSETRON HCL 4 MG/2ML IJ SOLN
4.0000 mg | Freq: Four times a day (QID) | INTRAMUSCULAR | Status: DC | PRN
  Administered 2015-02-01: 4 mg via INTRAVENOUS
  Filled 2015-01-30: qty 2

## 2015-01-30 MED ORDER — ACETAMINOPHEN 650 MG RE SUPP: 650.0000 mg | Freq: Four times a day (QID) | RECTAL | Status: DC | PRN

## 2015-01-30 MED ORDER — MEPERIDINE HCL 50 MG/ML IJ SOLN: 6.2500 mg | INTRAMUSCULAR | Status: DC | PRN

## 2015-01-30 MED ORDER — OXYCODONE HCL 5 MG PO TABS
5.0000 mg | ORAL_TABLET | ORAL | Status: DC | PRN
  Administered 2015-01-30 – 2015-02-02 (×10): 10 mg via ORAL
  Filled 2015-01-30 (×10): qty 2

## 2015-01-30 MED ORDER — MIDAZOLAM HCL 2 MG/2ML IJ SOLN
INTRAMUSCULAR | Status: AC
  Filled 2015-01-30: qty 2

## 2015-01-30 MED ORDER — PROMETHAZINE HCL 25 MG/ML IJ SOLN: 6.2500 mg | INTRAMUSCULAR | Status: DC | PRN

## 2015-01-30 MED ORDER — ACETAMINOPHEN 325 MG PO TABS: 650.0000 mg | ORAL_TABLET | Freq: Four times a day (QID) | ORAL | Status: DC | PRN

## 2015-01-30 MED ORDER — PROPOFOL 500 MG/50ML IV EMUL
INTRAVENOUS | Status: DC | PRN
  Administered 2015-01-30: 100 ug/kg/min via INTRAVENOUS

## 2015-01-30 MED ORDER — ALUM & MAG HYDROXIDE-SIMETH 200-200-20 MG/5ML PO SUSP: 30.0000 mL | ORAL | Status: DC | PRN

## 2015-01-30 SURGICAL SUPPLY — 34 items
BAG ZIPLOCK 12X15 (MISCELLANEOUS) IMPLANT
BENZOIN TINCTURE PRP APPL 2/3 (GAUZE/BANDAGES/DRESSINGS) IMPLANT
BLADE SAW SGTL 18X1.27X75 (BLADE) ×2 IMPLANT
CAPT HIP TOTAL 2 ×2 IMPLANT
CELLS DAT CNTRL 66122 CELL SVR (MISCELLANEOUS) ×1 IMPLANT
CLOTH BEACON ORANGE TIMEOUT ST (SAFETY) ×2 IMPLANT
DRAPE STERI IOBAN 125X83 (DRAPES) ×2 IMPLANT
DRAPE U-SHAPE 47X51 STRL (DRAPES) ×4 IMPLANT
DRSG AQUACEL AG ADV 3.5X 4 (GAUZE/BANDAGES/DRESSINGS) ×2 IMPLANT
DRSG AQUACEL AG ADV 3.5X10 (GAUZE/BANDAGES/DRESSINGS) ×2 IMPLANT
DURAPREP 26ML APPLICATOR (WOUND CARE) ×2 IMPLANT
ELECT REM PT RETURN 9FT ADLT (ELECTROSURGICAL) ×2
ELECTRODE REM PT RTRN 9FT ADLT (ELECTROSURGICAL) ×1 IMPLANT
GAUZE XEROFORM 1X8 LF (GAUZE/BANDAGES/DRESSINGS) ×2 IMPLANT
GLOVE BIO SURGEON STRL SZ7.5 (GLOVE) ×2 IMPLANT
GLOVE BIOGEL PI IND STRL 8 (GLOVE) ×2 IMPLANT
GLOVE BIOGEL PI INDICATOR 8 (GLOVE) ×2
GLOVE ECLIPSE 8.0 STRL XLNG CF (GLOVE) ×2 IMPLANT
GOWN STRL REUS W/TWL XL LVL3 (GOWN DISPOSABLE) ×4 IMPLANT
HANDPIECE INTERPULSE COAX TIP (DISPOSABLE) ×1
HOLDER FOLEY CATH W/STRAP (MISCELLANEOUS) ×2 IMPLANT
PACK ANTERIOR HIP CUSTOM (KITS) ×2 IMPLANT
RTRCTR WOUND ALEXIS 18CM MED (MISCELLANEOUS) ×2
SET HNDPC FAN SPRY TIP SCT (DISPOSABLE) ×1 IMPLANT
STAPLER VISISTAT 35W (STAPLE) ×2 IMPLANT
STRIP CLOSURE SKIN 1/2X4 (GAUZE/BANDAGES/DRESSINGS) IMPLANT
SUT ETHIBOND NAB CT1 #1 30IN (SUTURE) ×2 IMPLANT
SUT ETHILON 2 0 PS N (SUTURE) ×2 IMPLANT
SUT MNCRL AB 4-0 PS2 18 (SUTURE) IMPLANT
SUT VIC AB 0 CT1 36 (SUTURE) ×2 IMPLANT
SUT VIC AB 1 CT1 36 (SUTURE) ×2 IMPLANT
SUT VIC AB 2-0 CT1 27 (SUTURE) ×3
SUT VIC AB 2-0 CT1 TAPERPNT 27 (SUTURE) ×3 IMPLANT
TRAY FOLEY W/METER SILVER 14FR (SET/KITS/TRAYS/PACK) ×2 IMPLANT

## 2015-01-30 NOTE — Transfer of Care (Signed)
Immediate Anesthesia Transfer of Care Note  Patient: Margaret Reese  Procedure(s) Performed: Procedure(s): RIGHT TOTAL HIP ARTHROPLASTY ANTERIOR APPROACH AND REMOVAL LIPOMA RIGHT HIP (Right)  Patient Location: PACU  Anesthesia Type:Spinal  Level of Consciousness: awake, alert  and oriented  Airway & Oxygen Therapy: Patient Spontanous Breathing and Patient connected to face mask oxygen  Post-op Assessment: Report given to RN and Post -op Vital signs reviewed and stable  Post vital signs: Reviewed and stable  Last Vitals:  Filed Vitals:   01/30/15 0700  BP: 149/61  Pulse: 81  Temp: 36.5 C  Resp: 18    Complications: No apparent anesthesia complications

## 2015-01-30 NOTE — H&P (Signed)
TOTAL HIP ADMISSION H&P  Patient is admitted for right total hip arthroplasty.  Subjective:  Chief Complaint: right hip pain  HPI: Margaret Reese, 69 y.o. female, has a history of pain and functional disability in the right hip(s) due to arthritis and patient has failed non-surgical conservative treatments for greater than 12 weeks to include NSAID's and/or analgesics, flexibility and strengthening excercises, weight reduction as appropriate and activity modification.  Onset of symptoms was gradual starting 5 years ago with gradually worsening course since that time.The patient noted no past surgery on the right hip(s).  Patient currently rates pain in the right hip at 10 out of 10 with activity. Patient has night pain, worsening of pain with activity and weight bearing, pain that interfers with activities of daily living, pain with passive range of motion and crepitus. Patient has evidence of subchondral cysts, subchondral sclerosis, periarticular osteophytes and joint space narrowing by imaging studies. This condition presents safety issues increasing the risk of falls. There is no current active infection.  Patient Active Problem List   Diagnosis Date Noted  . Arthritis of right hip 09/11/2014  . Arthritis 09/11/2014  . Renal cyst 09/04/2014  . Allergic rhinitis due to pollen 09/04/2014  . Asthma, mild intermittent 09/04/2014  . Hypertension 07/08/2010  . Obesity, Class III, BMI 40-49.9 (morbid obesity) ([place]) 07/08/2010  . GERD (gastroesophageal reflux disease) 07/08/2010  . Calcium oxalate renal stones 07/08/2010  . Hyperlipidemia 07/08/2010   Past Medical History  Diagnosis Date  . Allergy   . Asthma   . Hypertension   . Retinal vein occlusion   . Obesity   . Renal stone   . GERD (gastroesophageal reflux disease)   . Complication of anesthesia     pt states has difficulty awakening; also has increased sinus drainage  . History of bronchitis   . Kidney cysts     pt states  not sure which kidney does see kidney specialist yearly pt states every thing okay currently   . Urinary frequency   . Arthritis   . H/O back injury   . Tingling     left arm   . Legally blind in right eye, as defined in [place]   . Trace cataracts   . Imbalance   . Falls   . Multiple gastric ulcers   . History of colon polyps     Past Surgical History  Procedure Laterality Date  . Cholecystectomy    . Colonoscopy  2011  . Eye surgery    . Tonsillectomy    . Upper gi endoscopy    . Cardiovascular stress test  dec 2016    Prescriptions prior to admission  Medication Sig Dispense Refill Last Dose  . Albuterol Sulfate (PROAIR RESPICLICK) [id number] (90 BASE) MCG/ACT AEPB Inhale 2 application into the lungs 4 (four) times daily as needed. 1 each 0 never  . aspirin 81 MG tablet Take 81 mg by mouth daily.     01/19/2015 at pm  . Cholecalciferol (VITAMIN D3) 1000 UNITS CAPS Take by mouth.     01/29/2015 at 2000  . fish oil-omega-3 fatty acids 1000 MG capsule Take 1 g by mouth daily.    01/29/2015 at 0900  . lisinopril-hydrochlorothiazide (PRINZIDE,ZESTORETIC) 10-12.5 MG per tablet TAKE 1 TABLET BY MOUTH DAILY 90 tablet 2 01/29/2015 at 0900  . Multiple Vitamins-Minerals (PRESERVISION AREDS 2) CAPS Take 1 capsule by mouth 2 (two) times daily.    01/29/2015 at 2000  . multivitamin (THERAGRAN) per tablet Take  1 tablet by mouth daily.     01/29/2015 at 0900  . naproxen sodium (ANAPROX) 220 MG tablet Take 220 mg by mouth daily as needed (joint pain).    2weeks  . potassium citrate (UROCIT-K) 5 MEQ (540 MG) SR tablet Take 1 tablet (5 mEq total) by mouth 2 (two) times daily. 180 tablet 3 01/29/2015 at 2000  . simvastatin (ZOCOR) 20 MG tablet Take 20 mg by mouth daily.   01/29/2015 at 2000  . acetaminophen (TYLENOL) 500 MG tablet Take 500 mg by mouth every 6 (six) hours as needed.   01/25/2015 at 1030   No Known Allergies  Social History  Substance Use Topics  . Smoking status: Never Smoker   . Smokeless  tobacco: Never Used  . Alcohol Use: No    Family History  Problem Relation Age of Onset  . Depression Mother   . Asthma Brother      Review of Systems  Musculoskeletal: Positive for joint pain.  All other systems reviewed and are negative.   Objective:  Physical Exam  Constitutional: She is oriented to person, place, and time. She appears well-developed and well-nourished.  HENT:  Head: Normocephalic and atraumatic.  Eyes: EOM are normal. Pupils are equal, round, and reactive to light.  Neck: Normal range of motion. Neck supple.  Cardiovascular: Normal rate and regular rhythm.   Respiratory: Effort normal and breath sounds normal.  GI: Soft. Bowel sounds are normal.  Musculoskeletal:       Right hip: She exhibits decreased range of motion, decreased strength, tenderness and bony tenderness.  Neurological: She is alert and oriented to person, place, and time.  Skin: Skin is warm and dry.  Psychiatric: She has a normal mood and affect.    Vital signs in last 24 hours: Temp:  [97.7 F (36.5 C)] 97.7 F (36.5 C) (12/09 0700) Pulse Rate:  [81] 81 (12/09 0700) Resp:  [18] 18 (12/09 0700) BP: (149)/(61) 149/61 mmHg (12/09 0700) SpO2:  [100 %] 100 % (12/09 0700) Weight:  [97.523 kg (215 lb)] 97.523 kg (215 lb) (12/09 0705)  Labs:   Estimated body mass index is 39.97 kg/(m^2) as calculated from the following:   Height as of this encounter: 5' 1.5" (1.562 m).   Weight as of this encounter: 97.523 kg (215 lb).   Imaging Review Plain radiographs demonstrate severe degenerative joint disease of the right hip(s). The bone quality appears to be good for age and reported activity level.  Assessment/Plan:  End stage arthritis, right hip(s)  The patient history, physical examination, clinical judgement of the provider and imaging studies are consistent with end stage degenerative joint disease of the right hip(s) and total hip arthroplasty is deemed medically necessary. The  treatment options including medical management, injection therapy, arthroscopy and arthroplasty were discussed at length. The risks and benefits of total hip arthroplasty were presented and reviewed. The risks due to aseptic loosening, infection, stiffness, dislocation/subluxation,  thromboembolic complications and other imponderables were discussed.  The patient acknowledged the explanation, agreed to proceed with the plan and consent was signed. Patient is being admitted for inpatient treatment for surgery, pain control, PT, OT, prophylactic antibiotics, VTE prophylaxis, progressive ambulation and ADL's and discharge planning.The patient is planning to be discharged home with home health services

## 2015-01-30 NOTE — Anesthesia Postprocedure Evaluation (Signed)
Anesthesia Post Note  Patient: Margaret Reese  Procedure(s) Performed: Procedure(s) (LRB): RIGHT TOTAL HIP ARTHROPLASTY ANTERIOR APPROACH AND REMOVAL LIPOMA RIGHT HIP (Right)  Patient location during evaluation: PACU Anesthesia Type: Spinal Level of consciousness: oriented and awake and alert Pain management: pain level controlled Vital Signs Assessment: post-procedure vital signs reviewed and stable Respiratory status: spontaneous breathing, respiratory function stable and patient connected to nasal cannula oxygen Cardiovascular status: blood pressure returned to baseline and stable Postop Assessment: no headache and no backache Anesthetic complications: no    Last Vitals:  Filed Vitals:   01/30/15 1318 01/30/15 1451  BP: 146/72 156/65  Pulse: 62 67  Temp: 36.5 C 36.4 C  Resp: 12 13    Last Pain:  Filed Vitals:   01/30/15 1505  PainSc: 5                  Montez Hageman

## 2015-01-30 NOTE — Anesthesia Procedure Notes (Signed)
Spinal Patient location during procedure: OR End time: 01/30/2015 9:58 AM Staffing Anesthesiologist: Montez Hageman Performed by: anesthesiologist  Preanesthetic Checklist Completed: patient identified, site marked, surgical consent, pre-op evaluation, timeout performed, IV checked, risks and benefits discussed and monitors and equipment checked Spinal Block Patient position: sitting Prep: Betadine Patient monitoring: heart rate, continuous pulse ox and blood pressure Approach: right paramedian Location: L3-4 Injection technique: single-shot Needle Needle type: Spinocan  Needle gauge: 22 G Needle length: 9 cm Assessment Sensory level: T4 Additional Notes Expiration date of kit checked and confirmed. Patient tolerated procedure well, without complications.

## 2015-01-30 NOTE — Brief Op Note (Signed)
01/30/2015  11:28 AM  PATIENT:  Margaret Reese  69 y.o. female  PRE-OPERATIVE DIAGNOSIS:  Severe osteoarthritis right hip, right hip mass/lipoma  POST-OPERATIVE DIAGNOSIS:  Severe osteoarthritis right hip, right hip mass/lipoma  PROCEDURE:  Procedure(s): RIGHT TOTAL HIP ARTHROPLASTY ANTERIOR APPROACH AND REMOVAL LIPOMA RIGHT HIP (Right)  SURGEON:  Surgeon(s) and Role:    * Mcarthur Rossetti, MD - Primary  PHYSICIAN ASSISTANT: Benita Stabile, PA-C  ANESTHESIA:   spinal  EBL:  Total I/O In: 1000 [I.V.:1000] Out: 350 [Blood:350]  BLOOD ADMINISTERED:none  DRAINS: none   LOCAL MEDICATIONS USED:  NONE  SPECIMEN:  No Specimen  DISPOSITION OF SPECIMEN:  N/A  COUNTS:  YES  TOURNIQUET:  * No tourniquets in log *  DICTATION: .Other Dictation: Dictation Number [id number]  PLAN OF CARE: Admit to inpatient   PATIENT DISPOSITION:  PACU - hemodynamically stable.   Delay start of Pharmacological VTE agent (>24hrs) due to surgical blood loss or risk of bleeding: no

## 2015-01-30 NOTE — Evaluation (Signed)
Physical Therapy Evaluation Patient Details Name: Margaret Reese MRN: [id number] DOB: [birth year] Today's Date: 01/30/2015   History of Present Illness  R THR  Clinical Impression  Pt s/p R THR presents with decreased R LE strength/ROM and post op pain limiting functional mobility.  Pt should progress to dc home with family assist and HHPT followup.    Follow Up Recommendations Home health PT    Equipment Recommendations  None recommended by PT    Recommendations for Other Services OT consult     Precautions / Restrictions Precautions Precautions: Fall Restrictions Weight Bearing Restrictions: No Other Position/Activity Restrictions: WBAT      Mobility  Bed Mobility Overal bed mobility: Needs Assistance;+2 for physical assistance;+ 2 for safety/equipment Bed Mobility: Supine to Sit;Sit to Supine     Supine to sit: Min assist;Mod assist;+2 for physical assistance;+2 for safety/equipment Sit to supine: Min assist;Mod assist;+2 for physical assistance;+2 for safety/equipment   General bed mobility comments: cues for sequence and use of L LE to self assist.  Physical assist to manage R LE and to control trunk  Transfers Overall transfer level: Needs assistance Equipment used: Rolling walker (2 wheeled) Transfers: Sit to/from Stand Sit to Stand: Min assist;+2 safety/equipment;From elevated surface         General transfer comment: cues for LE management and use of UEs to self assist  Ambulation/Gait Ambulation/Gait assistance: Min assist;+2 safety/equipment;+2 physical assistance Ambulation Distance (Feet): 12 Feet Assistive device: Rolling walker (2 wheeled) Gait Pattern/deviations: Step-to pattern;Decreased step length - right;Decreased step length - left;Shuffle;Trunk flexed Gait velocity: decr   General Gait Details: cues for posture, sequence and position from [employer] Mobility    Modified Rankin (Stroke Patients Only)        Balance                                             Pertinent Vitals/Pain Pain Assessment: 0-10 Pain Score: 8  Pain Location: R hip Pain Descriptors / Indicators: Aching;Sore Pain Intervention(s): Limited activity within patient's tolerance;Monitored during session;Premedicated before session;Ice applied    Home Living Family/patient expects to be discharged to:: Private residence Living Arrangements: Spouse/significant other Available Help at Discharge: Family Type of Home: House Home Access: Stairs to enter Entrance Stairs-Rails: Psychiatric nurse of Steps: 5 Home Layout: One level Home Equipment: Environmental consultant - 2 wheels      Prior Function Level of Independence: Independent with assistive device(s)               Hand Dominance        Extremity/Trunk Assessment   Upper Extremity Assessment: Overall WFL for tasks assessed           Lower Extremity Assessment: RLE deficits/detail      Cervical / Trunk Assessment: Normal  Communication   Communication: No difficulties  Cognition Arousal/Alertness: Awake/alert Behavior During Therapy: WFL for tasks assessed/performed Overall Cognitive Status: Within Functional Limits for tasks assessed                      General Comments      Exercises        Assessment/Plan    PT Assessment Patient needs continued PT services  PT Diagnosis Difficulty walking   PT Problem List Decreased strength;Decreased range of motion;Decreased  activity tolerance;Decreased mobility;Decreased knowledge of use of DME;Obesity;Pain  PT Treatment Interventions DME instruction;Gait training;Stair training;Functional mobility training;Therapeutic activities;Therapeutic exercise;Patient/family education   PT Goals (Current goals can be found in the Care Plan section) Acute Rehab PT Goals Patient Stated Goal: Walk with less pain PT Goal Formulation: With patient Time For Goal  Achievement: 02/06/15 Potential to Achieve Goals: Good    Frequency 7X/week   Barriers to discharge        Co-evaluation               End of Session Equipment Utilized During Treatment: Gait belt Activity Tolerance: Patient tolerated treatment well;Patient limited by pain Patient left: in bed;with call bell/phone within reach;with bed alarm set;with nursing/sitter in room Nurse Communication: Mobility status         Time: [id number] PT Time Calculation (min) (ACUTE ONLY): 23 min   Charges:   PT Evaluation $Initial PT Evaluation Tier I: 1 Procedure PT Treatments $Gait Training: 8-22 mins   PT G Codes:        Tajon Moring Mar 01, 2015, 5:37 PM

## 2015-01-30 NOTE — Progress Notes (Signed)
Utilization review completed.  

## 2015-01-30 NOTE — Anesthesia Preprocedure Evaluation (Addendum)
Anesthesia Evaluation  Patient identified by MRN, date of birth, ID band Patient awake    Reviewed: Allergy & Precautions, NPO status , Patient's Chart, lab work & pertinent test results  Airway Mallampati: II  TM Distance: >3 FB Neck ROM: Full    Dental no notable dental hx.    Pulmonary asthma ,    Pulmonary exam normal breath sounds clear to auscultation       Cardiovascular hypertension, Pt. on medications Normal cardiovascular exam Rhythm:Regular Rate:Normal     Neuro/Psych negative neurological ROS  negative psych ROS   GI/Hepatic Neg liver ROS, PUD,   Endo/Other  negative endocrine ROS  Renal/GU negative Renal ROS  negative genitourinary   Musculoskeletal negative musculoskeletal ROS (+)   Abdominal   Peds negative pediatric ROS (+)  Hematology negative hematology ROS (+)   Anesthesia Other Findings   Reproductive/Obstetrics negative OB ROS                            Anesthesia Physical Anesthesia Plan  ASA: II  Anesthesia Plan: Spinal   Post-op Pain Management:    Induction: Intravenous  Airway Management Planned: Simple Face Mask  Additional Equipment:   Intra-op Plan:   Post-operative Plan:   Informed Consent: I have reviewed the patients History and Physical, chart, labs and discussed the procedure including the risks, benefits and alternatives for the proposed anesthesia with the patient or authorized representative who has indicated his/her understanding and acceptance.   Dental advisory given  Plan Discussed with: CRNA  Anesthesia Plan Comments:         Anesthesia Quick Evaluation

## 2015-01-31 LAB — BASIC METABOLIC PANEL
Anion gap: 7 (ref 5–15)
BUN: 20 mg/dL (ref 6–20)
CHLORIDE: 104 mmol/L (ref 101–111)
CO2: 28 mmol/L (ref 22–32)
CREATININE: 0.8 mg/dL (ref 0.44–1.00)
Calcium: 8.9 mg/dL (ref 8.9–10.3)
GFR calc non Af Amer: >60 mL/min (ref >60)
Glucose, Bld: 109 mg/dL — ABNORMAL HIGH (ref 65–99)
POTASSIUM: 3.9 mmol/L (ref 3.5–5.1)
Sodium: 139 mmol/L (ref 135–145)

## 2015-01-31 LAB — CBC
HEMATOCRIT: 30.2 % — AB (ref 36.0–46.0)
Hemoglobin: 10 g/dL — ABNORMAL LOW (ref 12.0–15.0)
MCH: 28.7 pg (ref 26.0–34.0)
MCHC: 33.1 g/dL (ref 30.0–36.0)
MCV: 86.5 fL (ref 78.0–100.0)
Platelets: 195 10*3/uL (ref 150–400)
RBC: 3.49 MIL/uL — ABNORMAL LOW (ref 3.87–5.11)
RDW: 13.2 % (ref 11.5–15.5)
WBC: 8.5 10*3/uL (ref 4.0–10.5)

## 2015-01-31 MED ORDER — ASPIRIN 325 MG PO TBEC: 325.0000 mg | DELAYED_RELEASE_TABLET | Freq: Two times a day (BID) | ORAL | Status: DC

## 2015-01-31 MED ORDER — GABAPENTIN 300 MG PO CAPS
300.0000 mg | ORAL_CAPSULE | Freq: Every day | ORAL | Status: DC
  Administered 2015-01-31 – 2015-02-01 (×2): 300 mg via ORAL
  Filled 2015-01-31 (×3): qty 1

## 2015-01-31 MED ORDER — OXYCODONE HCL 5 MG PO TABS: 5.0000 mg | ORAL_TABLET | ORAL | Status: DC | PRN

## 2015-01-31 MED ORDER — METHOCARBAMOL 500 MG PO TABS: 500.0000 mg | ORAL_TABLET | Freq: Four times a day (QID) | ORAL | Status: DC | PRN

## 2015-01-31 MED ORDER — GABAPENTIN 600 MG PO TABS: 300.0000 mg | ORAL_TABLET | Freq: Every day | ORAL | Status: DC

## 2015-01-31 NOTE — Care Management Note (Signed)
Case Management Note  Patient Details  Name: TAYLORANN MCCUEN MRN: [id number] Date of Birth: 1945/03/02  Subjective/Objective:      RIGHT TOTAL HIP ARTHROPLASTY               Action/Plan: NCM spoke to pt and offered choice for [facility]. Pt agreeable to [facility] for [facility]. Pt states she has RW at home. Requesting 3n1 bedside commode and tub bench. Pt aware tub bench is an out of pocket expense. Contacted [facility] for DME for home. Husband at home to assist with her care as needed.   Expected Discharge Date:  02/02/2015               Expected Discharge Plan:  [place]  In-House Referral:  NA  Discharge planning Services  CM Consult  Post Acute Care Choice:  Home Health Choice offered to:  Patient  DME Arranged:  3-N-1, Tub bench DME Agency:  [place]:  PT [facility] Agency:  [place]  Status of Service:  Completed, signed off  Medicare Important Message Given:    Date Medicare IM Given:    Medicare IM give by:    Date Additional Medicare IM Given:    Additional Medicare Important Message give by:     If discussed at [place] of Stay Meetings, dates discussed:    Additional Comments:  Erenest Rasher, RN 01/31/2015, 6:12 PM

## 2015-01-31 NOTE — Discharge Instructions (Signed)

## 2015-01-31 NOTE — Progress Notes (Signed)
Physical Therapy Treatment Patient Details Name: Margaret Reese MRN: [id number] DOB: [birth year] Today's Date: 02/06/15    History of Present Illness R THR    PT Comments    Pt motivated and progressing with mobility.    Follow Up Recommendations  Home health PT     Equipment Recommendations  None recommended by PT    Recommendations for Other Services OT consult     Precautions / Restrictions Precautions Precautions: Fall Restrictions Weight Bearing Restrictions: No RLE Weight Bearing: Weight bearing as tolerated    Mobility  Bed Mobility Overal bed mobility: Needs Assistance Bed Mobility: Supine to Sit;Sit to Supine     Supine to sit: Min assist;Mod assist Sit to supine: Min assist;Mod assist   General bed mobility comments: Cues for technique, safety, and mod assist needed to bring hips forward for feet to touch ground   Transfers Overall transfer level: Needs assistance Equipment used: Rolling walker (2 wheeled) Transfers: Sit to/from Stand Sit to Stand: Min assist         General transfer comment: Cues for hand placement, RLE management, and safety  Ambulation/Gait Ambulation/Gait assistance: Min assist Ambulation Distance (Feet): 240 Feet Assistive device: Rolling walker (2 wheeled) Gait Pattern/deviations: Step-to pattern;Step-through pattern;Decreased step length - right;Decreased step length - left;Shuffle;Trunk flexed Gait velocity: decr Gait velocity interpretation: Below normal speed for age/gender General Gait Details: cues for posture, sequence and position from [employer]            Wheelchair Mobility    Modified Rankin (Stroke Patients Only)       Balance     Sitting balance-Leahy Scale: Fair       Standing balance-Leahy Scale: Fair                      Cognition Arousal/Alertness: Awake/alert Behavior During Therapy: WFL for tasks assessed/performed Overall Cognitive Status: Within Functional Limits  for tasks assessed                      Exercises      General Comments        Pertinent Vitals/Pain Pain Assessment: 0-10 Pain Score: 6  Pain Location: R hip/thigh Pain Descriptors / Indicators: Aching;Burning;Sore Pain Intervention(s): Limited activity within patient's tolerance;Monitored during session;Premedicated before session;Ice applied    Home Living                      Prior Function            PT Goals (current goals can now be found in the care plan section) Acute Rehab PT Goals Patient Stated Goal: Regain IND for my anniversary trip to [place] in April PT Goal Formulation: With patient Time For Goal Achievement: 02/06/15 Potential to Achieve Goals: Good Progress towards PT goals: Progressing toward goals    Frequency  7X/week    PT Plan Current plan remains appropriate    Co-evaluation             End of Session Equipment Utilized During Treatment: Gait belt Activity Tolerance: Patient tolerated treatment well Patient left: in bed;with call bell/phone within reach;with bed alarm set     Time: 1510-1538 PT Time Calculation (min) (ACUTE ONLY): 28 min  Charges:  $Gait Training: 23-37 mins                    G Codes:      Takhia Spoon 2015-02-06, 5:16 PM

## 2015-01-31 NOTE — Progress Notes (Signed)
Physical Therapy Treatment Patient Details Name: Margaret Reese MRN: [id number] DOB: 08/16/1945 Today's Date: 01/31/2015    History of Present Illness R THR    PT Comments    Pt motivated and good progress with mobility.    Follow Up Recommendations  Home health PT     Equipment Recommendations  None recommended by PT    Recommendations for Other Services OT consult     Precautions / Restrictions Precautions Precautions: Fall Restrictions Weight Bearing Restrictions: No RLE Weight Bearing: Weight bearing as tolerated    Mobility  Bed Mobility Overal bed mobility: Needs Assistance Bed Mobility: Sit to Supine Rolling: Mod assist   Supine to sit: Mod assist;HOB elevated Sit to supine: Mod assist   General bed mobility comments: Cues for technique, safety, and mod assist needed to bring hips forward for feet to touch ground   Transfers Overall transfer level: Needs assistance Equipment used: Rolling walker (2 wheeled) Transfers: Sit to/from Stand Sit to Stand: Min assist         General transfer comment: Cues for hand placement, RLE management, and safety  Ambulation/Gait Ambulation/Gait assistance: Min assist Ambulation Distance (Feet): 159 Feet Assistive device: Rolling walker (2 wheeled) Gait Pattern/deviations: Step-to pattern;Step-through pattern;Decreased step length - right;Decreased step length - left;Shuffle;Trunk flexed Gait velocity: decr   General Gait Details: cues for posture, sequence and position from [employer] Mobility    Modified Rankin (Stroke Patients Only)       Balance Overall balance assessment: Needs assistance Sitting-balance support: No upper extremity supported;Feet supported Sitting balance-Leahy Scale: Fair     Standing balance support: Bilateral upper extremity supported;During functional activity Standing balance-Leahy Scale: Fair                      Cognition  Arousal/Alertness: Awake/alert Behavior During Therapy: WFL for tasks assessed/performed Overall Cognitive Status: Within Functional Limits for tasks assessed                      Exercises Total Joint Exercises Ankle Circles/Pumps: AROM;Both;15 reps;Supine    General Comments        Pertinent Vitals/Pain Pain Assessment: 0-10 Pain Score: 8  Pain Location: R hip/thigh Pain Descriptors / Indicators: Aching;Burning Pain Intervention(s): Limited activity within patient's tolerance;Monitored during session;Premedicated before session;Ice applied    Home Living Family/patient expects to be discharged to:: Private residence Living Arrangements: Spouse/significant other Available Help at Discharge: Family Type of Home: House Home Access: Stairs to enter Entrance Stairs-Rails: Right;Left Home Layout: One level Home Equipment: Environmental consultant - 2 wheels      Prior Function Level of Independence: Independent with assistive device(s)          PT Goals (current goals can now be found in the care plan section) Acute Rehab PT Goals Patient Stated Goal: decrease pain PT Goal Formulation: With patient Time For Goal Achievement: 02/06/15 Potential to Achieve Goals: Good Progress towards PT goals: Progressing toward goals    Frequency  7X/week    PT Plan Current plan remains appropriate    Co-evaluation             End of Session Equipment Utilized During Treatment: Gait belt Activity Tolerance: Patient limited by fatigue;Patient limited by pain Patient left: in bed;with call bell/phone within reach;with bed alarm set     Time: [id number] PT Time Calculation (min) (ACUTE ONLY): 23 min  Charges:  $  Gait Training: 23-37 mins                    G Codes:      Mahdi Frye 02-18-2015, 2:14 PM

## 2015-01-31 NOTE — Evaluation (Signed)
Occupational Therapy Evaluation Patient Details Name: Margaret Reese MRN: [id number] DOB: 06/02/1945 Today's Date: 01/31/2015    History of Present Illness R THR   Clinical Impression   Patient presenting with decreased ADL and functional mobility independence & increased pain secondary to above. Patient overall mod I PTA. Patient currently functioning at an overall min to max assist level. Patient will benefit from acute OT to increase overall independence in the areas of ADLs, functional mobility, and overall safety in order to safely discharge home with assist from husband and HHOT.     Follow Up Recommendations  Home health OT;Supervision/Assistance - 24 hour    Equipment Recommendations  3 in 1 bedside comode;Tub/shower bench    Recommendations for Other Services  None at this time   Precautions / Restrictions Precautions Precautions: Fall Restrictions Weight Bearing Restrictions: Yes RLE Weight Bearing: Weight bearing as tolerated    Mobility Bed Mobility Overal bed mobility: Needs Assistance Bed Mobility: Supine to Sit;Rolling Rolling: Mod assist   Supine to sit: Mod assist;HOB elevated     General bed mobility comments: Cues for technique, safety, and mod assist needed to bring hips forward for feet to touch ground   Transfers Overall transfer level: Needs assistance Equipment used: Rolling walker (2 wheeled) Transfers: Sit to/from Stand Sit to Stand: Min assist General transfer comment: Cues for hand placement, RLE management, and safety    Balance Overall balance assessment: Needs assistance Sitting-balance support: No upper extremity supported;Feet supported Sitting balance-Leahy Scale: Fair     Standing balance support: Bilateral upper extremity supported;During functional activity Standing balance-Leahy Scale: Fair    ADL Overall ADL's : Needs assistance/impaired Eating/Feeding: Set up;Sitting   Grooming: Set up;Sitting   Upper Body Bathing:  Minimal assitance;Sitting   Lower Body Bathing: Maximal assistance;Sit to/from stand   Upper Body Dressing : Minimal assistance;Sitting   Lower Body Dressing: Maximal assistance;Sit to/from stand   Toilet Transfer: Minimal assistance;RW;Comfort height toilet;Ambulation Functional mobility during ADLs: Minimal assistance;Cueing for safety;Rolling walker General ADL Comments: Pt limited by pain. Educated pt on use of AE and plan to bring in next OT session.     Vision Vision Assessment?: No apparent visual deficits          Pertinent Vitals/Pain Pain Assessment: 0-10 Pain Score: 8  Pain Location: right hip Pain Descriptors / Indicators: Burning Pain Intervention(s): Limited activity within patient's tolerance;Monitored during session;Repositioned;Ice applied     Hand Dominance Right   Extremity/Trunk Assessment Upper Extremity Assessment Upper Extremity Assessment: Overall WFL for tasks assessed   Lower Extremity Assessment Lower Extremity Assessment: Defer to PT evaluation   Cervical / Trunk Assessment Cervical / Trunk Assessment: Normal   Communication Communication Communication: No difficulties   Cognition Arousal/Alertness: Awake/alert Behavior During Therapy: WFL for tasks assessed/performed Overall Cognitive Status: Within Functional Limits for tasks assessed              Home Living Family/patient expects to be discharged to:: Private residence Living Arrangements: Spouse/significant other Available Help at Discharge: Family Type of Home: House Home Access: Stairs to enter Technical brewer of Steps: 5 Entrance Stairs-Rails: Right;Left Home Layout: One level     Bathroom Shower/Tub: Corporate investment banker: Standard     Home Equipment: Environmental consultant - 2 wheels   Prior Functioning/Environment Level of Independence: Independent with assistive device(s)     OT Diagnosis: Generalized weakness;Acute pain   OT Problem List:  Decreased strength;Decreased range of motion;Decreased activity tolerance;Impaired balance (sitting and/or standing);Decreased safety awareness;Decreased  knowledge of use of DME or AE;Decreased knowledge of precautions;Pain   OT Treatment/Interventions: Self-care/ADL training;Therapeutic exercise;Energy conservation;DME and/or AE instruction;Patient/family education;Balance training;Therapeutic activities    OT Goals(Current goals can be found in the care plan section) Acute Rehab OT Goals Patient Stated Goal: decrease pain OT Goal Formulation: With patient/family Time For Goal Achievement: 02/14/15 Potential to Achieve Goals: Good ADL Goals Pt Will Perform Grooming: with supervision;standing Pt Will Perform Lower Body Bathing: with min assist;sit to/from stand;with adaptive equipment Pt Will Perform Lower Body Dressing: with min assist;with adaptive equipment;sit to/from stand Pt Will Transfer to Toilet: with supervision;ambulating;bedside commode Pt Will Perform Tub/Shower Transfer: Tub transfer;ambulating;tub bench;rolling walker;with supervision Additional ADL Goal #1: Pt will be supervision for functional mobility/ambulation using RW  OT Frequency: Min 2X/week   Barriers to D/C: none known at this time   End of Session Equipment Utilized During Treatment: Gait belt;Rolling walker Nurse Communication: Other (comment) (recommending 3-n-1, tub transfer bench, and HHOT)  Activity Tolerance: Patient limited by pain Patient left: in chair;with call bell/phone within reach;with family/visitor present   Time: [id number] OT Time Calculation (min): 34 min Charges:  OT General Charges $OT Visit: 1 Procedure OT Evaluation $Initial OT Evaluation Tier I: 1 Procedure OT Treatments $Self Care/Home Management : 8-22 mins  Angelisa Winthrop , MS, OTR/L, CLT Pager: [phone]  01/31/2015, 10:51 AM

## 2015-01-31 NOTE — Progress Notes (Addendum)
Patient continues to receive prophylactic analgesic therapy with scheduled IV Ketorolac dosing. Pain is localizable to the operative right lower extremity. Patient was encouraged routine use of the Incentive Spirometer device. Patient was instructed to use the device every  hour while awake at 10 breaths per session and coached to optimal performance. Subsequently, patient demonstrated proficiency in the use of the device.

## 2015-01-31 NOTE — Progress Notes (Signed)
Subjective: 1 Day Post-Op Procedure(s) (LRB): RIGHT TOTAL HIP ARTHROPLASTY ANTERIOR APPROACH AND REMOVAL LIPOMA RIGHT HIP (Right) Patient reports pain as moderate.  Some burning pain.  Asymptomatic acute blood loss anemia.  Objective: Vital signs in last 24 hours: Temp:  [97.5 F (36.4 C)-98.7 F (37.1 C)] 98.2 F (36.8 C) (12/10 1352) Pulse Rate:  [67-86] 86 (12/10 1352) Resp:  [13-16] 16 (12/10 1352) BP: (116-156)/(46-68) 142/62 mmHg (12/10 1352) SpO2:  [95 %-100 %] 98 % (12/10 1352)  Intake/Output from previous day: 12/09 0701 - 12/10 0700 In: 4150 [P.O.:120; I.V.:3930; IV Piggyback:100] Out: 860 [Urine:510; Blood:350] Intake/Output this shift: Total I/O In: -  Out: 150 [Urine:150]   Recent Labs  01/31/15 0555  HGB 10.0*    Recent Labs  01/31/15 0555  WBC 8.5  RBC 3.49*  HCT 30.2*  PLT 195    Recent Labs  01/31/15 0555  NA 139  K 3.9  CL 104  CO2 28  BUN 20  CREATININE 0.80  GLUCOSE 109*  CALCIUM 8.9   No results for input(s): LABPT, INR in the last 72 hours.  Sensation intact distally Intact pulses distally Dorsiflexion/Plantar flexion intact Incision: dressing C/D/I No cellulitis present  Assessment/Plan: 1 Day Post-Op Procedure(s) (LRB): RIGHT TOTAL HIP ARTHROPLASTY ANTERIOR APPROACH AND REMOVAL LIPOMA RIGHT HIP (Right) Up with therapy Discharge home with home health next 1-2 days.  Iyanah Demont Y 01/31/2015, 2:02 PM

## 2015-01-31 NOTE — Progress Notes (Signed)
3-n-1 and tub bench ordered per OT recommendation. Jiyan Walkowski, [employer]

## 2015-01-31 NOTE — Op Note (Signed)
Margaret Reese, Margaret Reese NO.:  [id number]  MEDICAL RECORD NO.:  [id number]  LOCATION:  57                         FACILITY:  [facility]  PHYSICIAN:  Lind Guest. Ninfa Linden, M.D.DATE OF BIRTH:  1945-07-12  DATE OF PROCEDURE:  01/30/2015 DATE OF DISCHARGE:                              OPERATIVE REPORT   PREOPERATIVE DIAGNOSES: 1. Severe end-stage arthritis, right hip. 2. Right thigh mass, questionable lipoma.  POSTOPERATIVE DIAGNOSES: 1. Severe end-stage arthritis, right hip. 2. Right thigh mass, questionable lipoma.  PROCEDURE: 1. Right total hip arthroplasty through direct anterior approach. 2. Excision of right lipoma type of mass.  IMPLANTS:  [employer] acetabular component size 52 with a single screw, size 36 +0 polyethylene liner, size 9 Corail femoral component with standard offset, size 36 -2 metal hip ball.  SURGEON:  Lind Guest. Ninfa Linden, M.D.  ASSISTANT:  Erskine Emery, PA-C.  ANESTHESIA:  Spinal.  ANTIBIOTICS:  3 g of IV Ancef.  BLOOD LOSS:  350 mL.  COMPLICATIONS:  None.  INDICATIONS:  Margaret Reese is a very pleasant 69 year old female with severe debilitating arthritis involving her right hip.  She has lost some significant amount of weight over time.  Her x-ray showed complete loss of her joint space.  At this point with the failure of conservative treatment, she wished to proceed with a total hip arthroplasty through direct anterior approach.  She also has a lipomatous type of mass, that has been there for years that is going to be near her incision, she would like to have this excised because how she feels that when she sits, it think this is reasonable as well.  Risks and benefits of the surgery had been explained to her in detail and she does wish to proceed.  PROCEDURE DESCRIPTION:  After informed consent was obtained, appropriate right hip and thigh were marked.  She was brought to the operating room and setup on  her stretcher, so spinal anesthesia could be obtained.  She was then laid supine on her stretcher and a Foley catheter was placed and both feet had traction boots applied to them.  Next, she was placed supine on the Hana fracture table with the perineal post in place and both legs in inline skeletal traction devices, but no traction applied. Her right operative hip and thigh were prepped and draped with DuraPrep and sterile drapes.  A time-out was called and she was identified as correct patient, correct right hip and right thigh.  We first made a small incision over the lipomatous mass and removed this easily in its entirety and it did appear to be consistent with lipoma, but we still send this off to Pathology.  We then irrigated that wound with normal saline solution and closed the deep tissue with 2-0 Vicryl suture and the skin with interrupted 2-0 nylon.  We then proceeded with a hip replacement surgery.  We made our hip incision inferior and posterior to the anterior superior iliac spine and carried this obliquely down the leg.  We dissected down the tensor fascia lata muscle and tensor fascia was then divided longitudinally, so we could proceed with direct anterior approach to the hip.  We identified and  cauterized the lateral femoral circumflex vessels and then identified the hip capsule, placed a Cobra retractors around the medial and lateral femoral neck.  We then opened up the hip capsule in an L-type format and found the hip to be completely devoid of cartilage.  We then used an oscillating saw to make a femoral neck cut and finished this with an osteotome.  We placed a corkscrew guide in the femoral head and removed the femoral head and found it to be devoid of cartilage.  We cleaned the acetabulum and remnants of the acetabular labrum and other debridement and placed a Bent Hohmann over the medial acetabular rim.  We then began reaming from the size 42 reamer up to a size 50  and reamed the 50, also under direct fluoroscopy, and I felt like I needed to get just a little bit deeper and wider due to the severe sclerotic margins of her hip, so we had to go up to a 52.  We placed the 52 under direct visualization and fluoroscopy as well as so we could obtain our depth of the implant inclination and anteversion.  I then placed a single screw as well to help fortify this.  We then placed the 36 +0 neutral polyethylene liner. Attention was then turned to the femur.  With the leg externally rotated to 100 degrees, extended and adducted, we were able to place the Mueller retractor laterally and a Hohmann retractor behind the greater trochanter.  We released the lateral joint capsule and the piriformis and then brought the leg up higher.  We then used a box cutting osteotome to enter the femoral canal and a rongeur to lateralize and then began broaching over just a size 8 and size 9 broach to a very thick and narrow canal.  With that broach, we trialed a standard neck and a 36 -2 hip ball because she was so tight and reduced this in the pelvis and she was stable throughout arc of rotation, but just a little bit long.  We then dislocated the hip and removed the trial components. I trialed to lateralize more to get the size 9 stem down.  I was able to get it down further and calcar planer off this and then I placed the real size 9 Corail femoral component with standard offset instead of the varus offset because she was already tight with more offset.  The stem, we did grab was left a little proud off the calcar.  We then placed the real 36 -2 metal hip ball and reduced this in the pelvis and it was stable, although again little long.  We then removed all instrumentation and irrigated the wound with normal saline solution using pulsatile lavage.  I closed the tensor fascia with interrupted 2-0 nylon suture followed by 0 Vicryl in the deep tissue, 2-0 Vicryl in the  subcutaneous tissue, and interrupted staples on the skin.  An Aquacel dressing was applied.  She was taken off the Hana table and taken to the recovery room in stable condition.  All final counts were correct.  There were no complications noted.  Of note, Erskine Emery, PA-C, assisted in the entire case and his assistance was crucial for facilitating all aspects of this case.     Lind Guest. Ninfa Linden, M.D.     CYB/MEDQ  D:  01/30/2015  T:  01/31/2015  Job:  [id number]

## 2015-02-01 MED ORDER — SENNA 8.6 MG PO TABS
1.0000 | ORAL_TABLET | Freq: Every day | ORAL | Status: DC
  Administered 2015-02-01 – 2015-02-02 (×2): 8.6 mg via ORAL

## 2015-02-01 MED ORDER — POLYETHYLENE GLYCOL 3350 17 G PO PACK
17.0000 g | PACK | Freq: Two times a day (BID) | ORAL | Status: DC | PRN
  Administered 2015-02-01: 17 g via ORAL
  Filled 2015-02-01: qty 1

## 2015-02-01 NOTE — Progress Notes (Signed)
Occupational Therapy Treatment Patient Details Name: Margaret Reese MRN: [id number] DOB: 1945/02/24 Today's Date: 02/01/2015    History of present illness R THR direct anterior   OT comments  Pt demonstrates increase time for sit<>Stand but able to push up into standing with spouse present. Pt demonstrates adequate level for d/c home. Pt reports having a high bed for home use but will sleep in recliner until able to complete bed transfer. Next session - educate on bed transfer.    Follow Up Recommendations  Home health OT;Supervision/Assistance - 24 hour    Equipment Recommendations  3 in 1 bedside comode;Tub/shower bench    Recommendations for Other Services      Precautions / Restrictions Precautions Precautions: Fall Restrictions RLE Weight Bearing: Weight bearing as tolerated       Mobility Bed Mobility               General bed mobility comments: in chair on arrival  Transfers Overall transfer level: Needs assistance Equipment used: Rolling walker (2 wheeled) Transfers: Sit to/from Stand Sit to Stand: Min assist         General transfer comment: pt attempting to pull on spouse initially and spouse standing in front of patient. Spouse educated on walking beside pt and to not allow pt to pull up. pt is able to push up with BIL UE. pt also reachign for environmental supports and catching herself.  Pt states "i can't do that."     Balance                                   ADL Overall ADL's : Needs assistance/impaired                       Lower Body Dressing Details (indicate cue type and reason): educated on AE for LB dressing.  pt educated with spouse present. ALL education complete in a seated position in chair. Pt able to return demo sock aide. reacher and long handle sponge. Pt most interested in uses for reacher. Pt educated on shoes and shoe laces for home.  Toilet Transfer: Minimal assistance;Ambulation;Grab bars;RW Hydrographic surveyor Details (indicate cue type and reason): spouse (A)ing and OT present to help with cues for hand placement and safety Toileting- Clothing Manipulation and Hygiene: Supervision/safety;Sit to/from Nurse, children's Details (indicate cue type and reason): pt asking about washing hair: educated on shower hand held , seat, sink bending to wash hair and salon as options.    General ADL Comments: pt plans to purchase reacher and educated on various methods to use reacher. Pt educated not to wash directly over incision site .      Vision                     Perception     Praxis      Cognition   Behavior During Therapy: [facility] for tasks assessed/performed Overall Cognitive Status: Within Functional Limits for tasks assessed                       Extremity/Trunk Assessment               Exercises Total Joint Exercises Ankle Circles/Pumps: AROM;Right;5 reps;Seated   Shoulder Instructions       General Comments      Pertinent Vitals/ Pain  Pain Assessment: Faces Faces Pain Scale: Hurts whole lot Pain Location: R knee Pain Intervention(s): Premedicated before session;Monitored during session;Ice applied  Home Living                                          Prior Functioning/Environment              Frequency Min 2X/week     Progress Toward Goals  OT Goals(current goals can now be found in the care plan section)  Progress towards OT goals: Not progressing toward goals - comment (not completing sit<>STAND with therapist)  Acute Rehab OT Goals Patient Stated Goal: go to [place] in March for anniversary OT Goal Formulation: With patient/family Time For Goal Achievement: 02/14/15 Potential to Achieve Goals: Good ADL Goals Pt Will Perform Grooming: with supervision;standing Pt Will Perform Lower Body Bathing: with min assist;sit to/from stand;with adaptive equipment Pt Will Perform Lower Body Dressing: with  min assist;with adaptive equipment;sit to/from stand Pt Will Transfer to Toilet: with supervision;ambulating;bedside commode Pt Will Perform Tub/Shower Transfer: Tub transfer;ambulating;tub bench;rolling walker;with supervision Additional ADL Goal #1: Pt will be supervision for functional mobility/ambulation using RW  Plan Discharge plan remains appropriate    Co-evaluation                 End of Session     Activity Tolerance Patient tolerated treatment well   Patient Left in chair;with family/visitor present   Nurse Communication Mobility status;Precautions        Time: [id number] OT Time Calculation (min): 42 min  Charges: OT General Charges $OT Visit: 1 Procedure OT Treatments $Self Care/Home Management : 38-52 mins  Margaret Reese 02/01/2015, 10:12 AM  Margaret Reese   OTR/L Pager: [phone] Office: [phone] .

## 2015-02-01 NOTE — Progress Notes (Signed)
Physical Therapy Treatment Patient Details Name: Margaret Reese MRN: [id number] DOB: [birth year] Today's Date: 02-15-15    History of Present Illness R THR direct anterior    PT Comments    Pt progressing steadily with mobility.  Follow Up Recommendations  Home health PT     Equipment Recommendations  None recommended by PT    Recommendations for Other Services OT consult     Precautions / Restrictions Precautions Precautions: Fall Restrictions Weight Bearing Restrictions: No RLE Weight Bearing: Weight bearing as tolerated Other Position/Activity Restrictions: WBAT    Mobility  Bed Mobility               General bed mobility comments: OOB with OT  Transfers Overall transfer level: Needs assistance Equipment used: Rolling walker (2 wheeled) Transfers: Sit to/from Stand Sit to Stand: Min guard         General transfer comment: Cues for LE management and use of UEs to self assist  Ambulation/Gait Ambulation/Gait assistance: Min guard Ambulation Distance (Feet): 123 Feet Assistive device: Rolling walker (2 wheeled) Gait Pattern/deviations: Step-to pattern;Step-through pattern;Decreased step length - right;Decreased step length - left;Shuffle;Trunk flexed Gait velocity: decr   General Gait Details: min cues for posture, sequence and position from [employer]    Modified Rankin (Stroke Patients Only)       Balance                                    Cognition Arousal/Alertness: Awake/alert Behavior During Therapy: WFL for tasks assessed/performed Overall Cognitive Status: Within Functional Limits for tasks assessed                      Exercises Total Joint Exercises Ankle Circles/Pumps: AROM;Both;15 reps;Supine Quad Sets: AROM;Both;10 reps;Supine Heel Slides: AAROM;20 reps;Right;Supine Hip ABduction/ADduction: AAROM;Right;15 reps;Supine    General Comments         Pertinent Vitals/Pain Pain Assessment: 0-10 Pain Score: 5  Pain Location: R hip/knee Pain Descriptors / Indicators: Aching;Burning;Sore Pain Intervention(s): Limited activity within patient's tolerance;Monitored during session;Premedicated before session;Ice applied;Patient requesting pain meds-RN notified    Home Living                      Prior Function            PT Goals (current goals can now be found in the care plan section) Acute Rehab PT Goals Patient Stated Goal: go to [place] in March for anniversary PT Goal Formulation: With patient Time For Goal Achievement: 02/06/15 Potential to Achieve Goals: Good Progress towards PT goals: Progressing toward goals    Frequency  7X/week    PT Plan Current plan remains appropriate    Co-evaluation             End of Session Equipment Utilized During Treatment: Gait belt Activity Tolerance: Patient tolerated treatment well Patient left: in chair;with call bell/phone within reach;with family/visitor present     Time: 1150-1228 PT Time Calculation (min) (ACUTE ONLY): 38 min  Charges:  $Gait Training: 23-37 mins $Therapeutic Exercise: 8-22 mins                    G Codes:      Eesa Justiss 2015/02/15, 2:13 PM

## 2015-02-01 NOTE — Progress Notes (Signed)
Physical Therapy Treatment Patient Details Name: Margaret Reese MRN: [id number] DOB: 01/09/1946 Today's Date: 02/01/2015    History of Present Illness R THR direct anterior    PT Comments    Pt motivated but limited this pm by nausea.  Will progress to stairs in am.  Follow Up Recommendations  Home health PT     Equipment Recommendations  None recommended by PT    Recommendations for Other Services OT consult     Precautions / Restrictions Precautions Precautions: Fall Restrictions Weight Bearing Restrictions: No RLE Weight Bearing: Weight bearing as tolerated Other Position/Activity Restrictions: WBAT    Mobility  Bed Mobility Overal bed mobility: Needs Assistance Bed Mobility: Sit to Supine       Sit to supine: Min assist   General bed mobility comments: Cues for sequence and use of L LE to self assist  Transfers Overall transfer level: Needs assistance Equipment used: Rolling walker (2 wheeled) Transfers: Sit to/from Stand Sit to Stand: Min guard         General transfer comment: Cues for LE management and use of UEs to self assist  Ambulation/Gait Ambulation/Gait assistance: Min assist Ambulation Distance (Feet): 38 Feet Assistive device: Rolling walker (2 wheeled) Gait Pattern/deviations: Step-to pattern;Decreased step length - right;Decreased step length - left;Shuffle;Trunk flexed Gait velocity: decr   General Gait Details: min cues for posture, sequence and position from RW; ltd by increasing nausea   Stairs            Wheelchair Mobility    Modified Rankin (Stroke Patients Only)       Balance                                    Cognition Arousal/Alertness: Awake/alert Behavior During Therapy: WFL for tasks assessed/performed Overall Cognitive Status: Within Functional Limits for tasks assessed                      Exercises Total Joint Exercises Ankle Circles/Pumps: AROM;Both;15 reps;Supine Quad  Sets: AROM;Both;10 reps;Supine Heel Slides: AAROM;20 reps;Right;Supine Hip ABduction/ADduction: AAROM;Right;15 reps;Supine    General Comments        Pertinent Vitals/Pain Pain Assessment: 0-10 Pain Score: 5  Pain Location: R hip/knee Pain Descriptors / Indicators: Aching;Burning;Sore Pain Intervention(s): Limited activity within patient's tolerance;Monitored during session;Premedicated before session;Ice applied    Home Living                      Prior Function            PT Goals (current goals can now be found in the care plan section) Acute Rehab PT Goals Patient Stated Goal: go to [place] in March for anniversary PT Goal Formulation: With patient Time For Goal Achievement: 02/06/15 Potential to Achieve Goals: Good Progress towards PT goals: Progressing toward goals    Frequency  7X/week    PT Plan Current plan remains appropriate    Co-evaluation             End of Session Equipment Utilized During Treatment: Gait belt Activity Tolerance: Other (comment) (nausea) Patient left: in bed;with call bell/phone within reach;with family/visitor present     Time: [id number] PT Time Calculation (min) (ACUTE ONLY): 26 min  Charges:  $Gait Training: 8-22 mins $Therapeutic Exercise: 8-22 mins  G Codes:      Calvyn Kurtzman 03/01/2015, 2:21 PM

## 2015-02-01 NOTE — Progress Notes (Signed)
Patient ID: Margaret Reese, female   DOB: 1945-07-05, 68 y.o.   MRN: [id number] Postoperative day 2 right total hip arthroplasty. Patient states she had some knee pain yesterday but she states she's feeling better today. Possibly discharge to home on Monday.

## 2015-02-02 LAB — TYPE AND SCREEN
ABO/RH(D): O POS
ANTIBODY SCREEN: NEGATIVE

## 2015-02-02 NOTE — Progress Notes (Signed)
Subjective: 3 Days Post-Op Procedure(s) (LRB): RIGHT TOTAL HIP ARTHROPLASTY ANTERIOR APPROACH AND REMOVAL LIPOMA RIGHT HIP (Right) Patient reports pain as moderate.    Objective: Vital signs in last 24 hours: Temp:  [98.5 F (36.9 C)-99.2 F (37.3 C)] 98.5 F (36.9 C) (12/12 0426) Pulse Rate:  [81-91] 87 (12/12 0426) Resp:  [15] 15 (12/12 0426) BP: (108-132)/(49-65) 132/65 mmHg (12/12 0426) SpO2:  [96 %-100 %] 100 % (12/12 0426)  Intake/Output from previous day: 12/11 0701 - 12/12 0700 In: 720 [P.O.:720] Out: -  Intake/Output this shift:     Recent Labs  01/31/15 0555  HGB 10.0*    Recent Labs  01/31/15 0555  WBC 8.5  RBC 3.49*  HCT 30.2*  PLT 195    Recent Labs  01/31/15 0555  NA 139  K 3.9  CL 104  CO2 28  BUN 20  CREATININE 0.80  GLUCOSE 109*  CALCIUM 8.9   No results for input(s): LABPT, INR in the last 72 hours.  Sensation intact distally Intact pulses distally Dorsiflexion/Plantar flexion intact Incision: scant drainage  Assessment/Plan: 3 Days Post-Op Procedure(s) (LRB): RIGHT TOTAL HIP ARTHROPLASTY ANTERIOR APPROACH AND REMOVAL LIPOMA RIGHT HIP (Right) Discharge home with home health today  Mcarthur Rossetti 02/02/2015, 7:24 AM

## 2015-02-02 NOTE — Discharge Summary (Signed)
Patient ID: Margaret Reese MRN: [id number] DOB/AGE: 23-Nov-1945 69 y.o.  Admit date: 01/30/2015 Discharge date: 02/02/2015  Admission Diagnoses:  Principal Problem:   Osteoarthritis of right hip Active Problems:   Status post total replacement of right hip   Discharge Diagnoses:  Same  Past Medical History  Diagnosis Date  . Allergy   . Asthma   . Hypertension   . Retinal vein occlusion   . Obesity   . Renal stone   . GERD (gastroesophageal reflux disease)   . Complication of anesthesia     pt states has difficulty awakening; also has increased sinus drainage  . History of bronchitis   . Kidney cysts     pt states not sure which kidney does see kidney specialist yearly pt states every thing okay currently   . Urinary frequency   . Arthritis   . H/O back injury   . Tingling     left arm   . Legally blind in right eye, as defined in [place]   . Trace cataracts   . Imbalance   . Falls   . Multiple gastric ulcers   . History of colon polyps     Surgeries: Procedure(s): RIGHT TOTAL HIP ARTHROPLASTY ANTERIOR APPROACH AND REMOVAL LIPOMA RIGHT HIP on 01/30/2015   Consultants:    Discharged Condition: Improved  Hospital Course: Margaret Reese is an 69 y.o. female who was admitted 01/30/2015 for operative treatment ofOsteoarthritis of right hip. Patient has severe unremitting pain that affects sleep, daily activities, and work/hobbies. After pre-op clearance the patient was taken to the operating room on 01/30/2015 and underwent  Procedure(s): RIGHT TOTAL HIP ARTHROPLASTY ANTERIOR APPROACH AND REMOVAL LIPOMA RIGHT HIP.    Patient was given perioperative antibiotics: Anti-infectives    Start     Dose/Rate Route Frequency Ordered Stop   01/30/15 1600  ceFAZolin (ANCEF) IVPB 2 g/50 mL premix     2 g 100 mL/hr over 30 Minutes Intravenous Every 6 hours 01/30/15 1327 01/30/15 2300   01/30/15 0705  ceFAZolin (ANCEF) IVPB 2 g/50 mL premix     2 g 100 mL/hr over 30 Minutes  Intravenous On call to O.R. 01/30/15 0705 01/30/15 1002       Patient was given sequential compression devices, early ambulation, and chemoprophylaxis to prevent DVT.  Patient benefited maximally from hospital stay and there were no complications.    Recent vital signs: Patient Vitals for the past 24 hrs:  BP Temp Temp src Pulse Resp SpO2  02/02/15 0426 132/65 mmHg 98.5 F (36.9 C) Oral 87 15 100 %  02/01/15 2145 (!) 119/49 mmHg 99 F (37.2 C) Oral 81 15 97 %  02/01/15 1400 (!) 108/58 mmHg 99.2 F (37.3 C) Oral 91 15 96 %     Recent laboratory studies:  Recent Labs  01/31/15 0555  WBC 8.5  HGB 10.0*  HCT 30.2*  PLT 195  NA 139  K 3.9  CL 104  CO2 28  BUN 20  CREATININE 0.80  GLUCOSE 109*  CALCIUM 8.9     Discharge Medications:     Medication List    STOP taking these medications        aspirin 81 MG tablet  Replaced by:  aspirin 325 MG EC tablet     naproxen sodium 220 MG tablet  Commonly known as:  ANAPROX      TAKE these medications        acetaminophen 500 MG tablet  Commonly known as:  TYLENOL  Take 500 mg by mouth every 6 (six) hours as needed.     Albuterol Sulfate 108 (90 BASE) MCG/ACT Aepb  Commonly known as:  PROAIR RESPICLICK  Inhale 2 application into the lungs 4 (four) times daily as needed.     aspirin 325 MG EC tablet  Take 1 tablet (325 mg total) by mouth 2 (two) times daily after a meal.     fish oil-omega-3 fatty acids 1000 MG capsule  Take 1 g by mouth daily.     gabapentin 600 MG tablet  Commonly known as:  NEURONTIN  Take 0.5 tablets (300 mg total) by mouth at bedtime.     lisinopril-hydrochlorothiazide 10-12.5 MG tablet  Commonly known as:  PRINZIDE,ZESTORETIC  TAKE 1 TABLET BY MOUTH DAILY     methocarbamol 500 MG tablet  Commonly known as:  ROBAXIN  Take 1 tablet (500 mg total) by mouth every 6 (six) hours as needed for muscle spasms.     multivitamin per tablet  Take 1 tablet by mouth daily.     oxyCODONE 5 MG  immediate release tablet  Commonly known as:  Oxy IR/ROXICODONE  Take 1-2 tablets (5-10 mg total) by mouth every 4 (four) hours as needed for severe pain.     potassium citrate 5 MEQ (540 MG) SR tablet  Commonly known as:  UROCIT-K  Take 1 tablet (5 mEq total) by mouth 2 (two) times daily.     PRESERVISION AREDS 2 Caps  Take 1 capsule by mouth 2 (two) times daily.     simvastatin 20 MG tablet  Commonly known as:  ZOCOR  Take 20 mg by mouth daily.     Vitamin D3 1000 UNITS Caps  Take by mouth.        Diagnostic Studies: Dg C-arm 61-120 Min-no Report  01/30/2015  CLINICAL DATA: surg C-ARM 61-120 MINUTES Fluoroscopy was utilized by the requesting physician.  No radiographic interpretation.   Dg Hip Unilat With Pelvis 1v Right  01/30/2015  CLINICAL DATA:  Right hip replacement EXAM: DG HIP (WITH OR WITHOUT PELVIS) 1V RIGHT COMPARISON:  Radiography 09/04/2014 FINDINGS: AP fluoroscopy shows placement of a total hip arthroplasty without evidence of periprosthetic fracture or dislocation. IMPRESSION: Fluoroscopy for total right hip arthroplasty.  No acute finding. Electronically Signed   By: Monte Fantasia M.D.   On: 01/30/2015 11:32   Dg Hip Port Unilat With Pelvis 1v Right  01/30/2015  CLINICAL DATA:  69 year old female status post right hip replacement. EXAM: DG HIP (WITH OR WITHOUT PELVIS) 1V PORT RIGHT COMPARISON:  01/30/2015. FINDINGS: Postoperative changes of right total hip arthroplasty are noted. Both the femoral and acetabular components of the prosthesis appear properly seated without periprosthetic fracture or other immediate complicating features. The prosthetic femoral head is properly located within the prosthetic acetabulum. Small amount of gas in the right hip joint space and in the overlying soft tissues. Skin staples lateral to the right hip joint. IMPRESSION: 1. Expected postoperative appearance following right total hip arthroplasty, as above, without acute complicating  features. Electronically Signed   By: Vinnie Langton M.D.   On: 01/30/2015 12:59    Disposition: to home      Discharge Instructions    Discharge patient    Complete by:  As directed            Follow-up Information    Follow up with Mcarthur Rossetti, MD In 2 weeks.   Specialty:  Orthopedic Surgery   Contact information:  [place] [address] [phone]       Follow up with [facility].   Why:  Home Health Physical Therapy   Contact information:   [address] [place] [address] [phone]       Follow up with Mcarthur Rossetti, MD In 2 weeks.   Specialty:  Orthopedic Surgery   Contact information:   [place] [address] [phone]        Signed: Mcarthur Rossetti 02/02/2015, 7:25 AM

## 2015-02-02 NOTE — Progress Notes (Signed)
Occupational Therapy Treatment Patient Details Name: Margaret Reese MRN: [id number] DOB: [birth year] Today's Date: 02/02/2015    History of present illness R THR direct anterior   OT comments  Ready to Dc home  Follow Up Recommendations  Home health OT;Supervision/Assistance - 24 hour    Equipment Recommendations  3 in 1 bedside comode;Tub/shower bench    Recommendations for Other Services      Precautions / Restrictions Restrictions Weight Bearing Restrictions: No RLE Weight Bearing: Weight bearing as tolerated       Mobility Bed Mobility               General bed mobility comments: pt in chair  Transfers Overall transfer level: Needs assistance Equipment used: Rolling walker (2 wheeled) Transfers: Sit to/from Stand Sit to Stand: Min guard         General transfer comment: Cues for LE management and use of UEs to self assist        ADL Overall ADL's : Needs assistance/impaired                                       General ADL Comments: Education complete regarding ADL activity. Pt has tub bench and 3 n 1.  Encouraged pt to ask [facility] therapist to A with placement of tub bench in the bathroom.  Husband will A as needed                Cognition   Behavior During Therapy: WFL for tasks assessed/performed Overall Cognitive Status: Within Functional Limits for tasks assessed                               General Comments      Pertinent Vitals/ Pain       Pain Score: 3  Pain Location: R hip Pain Descriptors / Indicators: Sore Pain Intervention(s): Monitored during session  Home Living                                              Frequency Min 2X/week     Progress Toward Goals  OT Goals(current goals can now be found in the care plan section)  Progress towards OT goals: Progressing toward goals     Plan Discharge plan remains appropriate       End of Session     Activity Tolerance  Patient tolerated treatment well   Patient Left in chair;with family/visitor present   Nurse Communication Mobility status        Time: 0922-0932 OT Time Calculation (min): 10 min  Charges: OT General Charges $OT Visit: 1 Procedure OT Treatments $Self Care/Home Management : 8-22 mins  Sister Carbone D 02/02/2015, 9:47 AM

## 2015-02-02 NOTE — Progress Notes (Signed)
Physical Therapy Treatment Patient Details Name: Margaret Reese MRN: [id number] DOB: [birth year] Today's Date: 02/02/2015    History of Present Illness R THR direct anterior    PT Comments    Pt progressing well with mobility, she walked 250' + 100' with RW, completed stair training, and demonstrates understanding of HEP. She is ready to DC home from PT standpoint.   Follow Up Recommendations  Home health PT     Equipment Recommendations  None recommended by PT    Recommendations for Other Services OT consult     Precautions / Restrictions Precautions Precautions: Fall Restrictions Weight Bearing Restrictions: No RLE Weight Bearing: Weight bearing as tolerated    Mobility  Bed Mobility               General bed mobility comments: pt in chair  Transfers Overall transfer level: Needs assistance Equipment used: Rolling walker (2 wheeled) Transfers: Sit to/from Stand Sit to Stand: Supervision         General transfer comment: Cues for LE management and use of UEs to self assist  Ambulation/Gait Ambulation/Gait assistance: Modified independent (Device/Increase time) Ambulation Distance (Feet): 250 Feet Assistive device: Rolling walker (2 wheeled) Gait Pattern/deviations: Step-to pattern Gait velocity: decr   General Gait Details: steady with RW, no LOB, good sequencing   Stairs Stairs: Yes Stairs assistance: Min guard Stair Management: One rail Right;Step to pattern;Forwards;With crutches Number of Stairs: 4 General stair comments: min/guard for safety, good sequencing  Wheelchair Mobility    Modified Rankin (Stroke Patients Only)       Balance     Sitting balance-Leahy Scale: Fair       Standing balance-Leahy Scale: Fair                      Cognition Arousal/Alertness: Awake/alert Behavior During Therapy: WFL for tasks assessed/performed Overall Cognitive Status: Within Functional Limits for tasks assessed                      Exercises Total Joint Exercises Ankle Circles/Pumps: AROM;Both;15 reps;Supine Short Arc Quad: AROM;Right;20 reps Heel Slides: AAROM;20 reps;Right;Supine Hip ABduction/ADduction: AAROM;Right;15 reps;Supine    General Comments        Pertinent Vitals/Pain Pain Score: 3  Pain Location: R hip Pain Descriptors / Indicators: Sore Pain Intervention(s): Monitored during session    Home Living                      Prior Function            PT Goals (current goals can now be found in the care plan section) Acute Rehab PT Goals Patient Stated Goal: go to [place] in March for anniversary PT Goal Formulation: With patient Time For Goal Achievement: 02/06/15 Potential to Achieve Goals: Good Progress towards PT goals: Goals met/education completed, patient discharged from PT    Frequency  7X/week    PT Plan Current plan remains appropriate    Co-evaluation             End of Session Equipment Utilized During Treatment: Gait belt Activity Tolerance: Patient tolerated treatment well Patient left: with call bell/phone within reach;with family/visitor present;in chair     Time: 5188-4166 PT Time Calculation (min) (ACUTE ONLY): 37 min  Charges:  $Gait Training: 8-22 mins $Therapeutic Exercise: 8-22 mins                    G Codes:  Uhlenberg, Jennifer Kistler 02/02/2015, 10:19 AM [phone]  

## 2015-02-03 DIAGNOSIS — M1990 Unspecified osteoarthritis, unspecified site: Secondary | ICD-10-CM | POA: Diagnosis not present

## 2015-02-03 DIAGNOSIS — Z483 Aftercare following surgery for neoplasm: Secondary | ICD-10-CM | POA: Diagnosis not present

## 2015-02-03 DIAGNOSIS — J4520 Mild intermittent asthma, uncomplicated: Secondary | ICD-10-CM | POA: Diagnosis not present

## 2015-02-03 DIAGNOSIS — E669 Obesity, unspecified: Secondary | ICD-10-CM | POA: Diagnosis not present

## 2015-02-03 DIAGNOSIS — Z471 Aftercare following joint replacement surgery: Secondary | ICD-10-CM | POA: Diagnosis not present

## 2015-02-03 DIAGNOSIS — I10 Essential (primary) hypertension: Secondary | ICD-10-CM | POA: Diagnosis not present

## 2015-02-04 DIAGNOSIS — M1990 Unspecified osteoarthritis, unspecified site: Secondary | ICD-10-CM | POA: Diagnosis not present

## 2015-02-04 DIAGNOSIS — I10 Essential (primary) hypertension: Secondary | ICD-10-CM | POA: Diagnosis not present

## 2015-02-04 DIAGNOSIS — Z483 Aftercare following surgery for neoplasm: Secondary | ICD-10-CM | POA: Diagnosis not present

## 2015-02-04 DIAGNOSIS — Z471 Aftercare following joint replacement surgery: Secondary | ICD-10-CM | POA: Diagnosis not present

## 2015-02-04 DIAGNOSIS — J4520 Mild intermittent asthma, uncomplicated: Secondary | ICD-10-CM | POA: Diagnosis not present

## 2015-02-04 DIAGNOSIS — E669 Obesity, unspecified: Secondary | ICD-10-CM | POA: Diagnosis not present

## 2015-02-06 DIAGNOSIS — J4520 Mild intermittent asthma, uncomplicated: Secondary | ICD-10-CM | POA: Diagnosis not present

## 2015-02-06 DIAGNOSIS — I10 Essential (primary) hypertension: Secondary | ICD-10-CM | POA: Diagnosis not present

## 2015-02-06 DIAGNOSIS — E669 Obesity, unspecified: Secondary | ICD-10-CM | POA: Diagnosis not present

## 2015-02-06 DIAGNOSIS — Z471 Aftercare following joint replacement surgery: Secondary | ICD-10-CM | POA: Diagnosis not present

## 2015-02-06 DIAGNOSIS — Z483 Aftercare following surgery for neoplasm: Secondary | ICD-10-CM | POA: Diagnosis not present

## 2015-02-06 DIAGNOSIS — M1990 Unspecified osteoarthritis, unspecified site: Secondary | ICD-10-CM | POA: Diagnosis not present

## 2015-02-09 DIAGNOSIS — M1990 Unspecified osteoarthritis, unspecified site: Secondary | ICD-10-CM | POA: Diagnosis not present

## 2015-02-09 DIAGNOSIS — Z483 Aftercare following surgery for neoplasm: Secondary | ICD-10-CM | POA: Diagnosis not present

## 2015-02-09 DIAGNOSIS — E669 Obesity, unspecified: Secondary | ICD-10-CM | POA: Diagnosis not present

## 2015-02-09 DIAGNOSIS — Z471 Aftercare following joint replacement surgery: Secondary | ICD-10-CM | POA: Diagnosis not present

## 2015-02-09 DIAGNOSIS — J4520 Mild intermittent asthma, uncomplicated: Secondary | ICD-10-CM | POA: Diagnosis not present

## 2015-02-09 DIAGNOSIS — I10 Essential (primary) hypertension: Secondary | ICD-10-CM | POA: Diagnosis not present

## 2015-02-11 DIAGNOSIS — I10 Essential (primary) hypertension: Secondary | ICD-10-CM | POA: Diagnosis not present

## 2015-02-11 DIAGNOSIS — E669 Obesity, unspecified: Secondary | ICD-10-CM | POA: Diagnosis not present

## 2015-02-11 DIAGNOSIS — J4520 Mild intermittent asthma, uncomplicated: Secondary | ICD-10-CM | POA: Diagnosis not present

## 2015-02-11 DIAGNOSIS — M1990 Unspecified osteoarthritis, unspecified site: Secondary | ICD-10-CM | POA: Diagnosis not present

## 2015-02-11 DIAGNOSIS — Z471 Aftercare following joint replacement surgery: Secondary | ICD-10-CM | POA: Diagnosis not present

## 2015-02-11 DIAGNOSIS — Z483 Aftercare following surgery for neoplasm: Secondary | ICD-10-CM | POA: Diagnosis not present

## 2015-02-12 ENCOUNTER — Other Ambulatory Visit (HOSPITAL_COMMUNITY): Payer: Self-pay | Admitting: Orthopaedic Surgery

## 2015-02-12 ENCOUNTER — Ambulatory Visit (HOSPITAL_COMMUNITY)
Admission: RE | Admit: 2015-02-12 | Discharge: 2015-02-12 | Disposition: A | Discharge status: Home / Self Care | Payer: Medicare Other | Source: Ambulatory Visit | Attending: Family Medicine | Admitting: Family Medicine

## 2015-02-12 DIAGNOSIS — M7989 Other specified soft tissue disorders: Secondary | ICD-10-CM | POA: Insufficient documentation

## 2015-02-12 DIAGNOSIS — R52 Pain, unspecified: Secondary | ICD-10-CM | POA: Diagnosis not present

## 2015-02-12 NOTE — Progress Notes (Addendum)
Preliminary results by tech - Right Lower Ext. Venous Duplex Completed. Negative for deep and superficial vein thrombosis in the right leg. Results given to [facility] at Dr. Trevor Mace office. Oda Cogan, BS, RDMS, RVT

## 2015-02-13 DIAGNOSIS — Z483 Aftercare following surgery for neoplasm: Secondary | ICD-10-CM | POA: Diagnosis not present

## 2015-02-13 DIAGNOSIS — M1990 Unspecified osteoarthritis, unspecified site: Secondary | ICD-10-CM | POA: Diagnosis not present

## 2015-02-13 DIAGNOSIS — E669 Obesity, unspecified: Secondary | ICD-10-CM | POA: Diagnosis not present

## 2015-02-13 DIAGNOSIS — I10 Essential (primary) hypertension: Secondary | ICD-10-CM | POA: Diagnosis not present

## 2015-02-13 DIAGNOSIS — J4520 Mild intermittent asthma, uncomplicated: Secondary | ICD-10-CM | POA: Diagnosis not present

## 2015-02-13 DIAGNOSIS — Z471 Aftercare following joint replacement surgery: Secondary | ICD-10-CM | POA: Diagnosis not present

## 2015-03-10 DIAGNOSIS — R9431 Abnormal electrocardiogram [ECG] [EKG]: Secondary | ICD-10-CM | POA: Diagnosis not present

## 2015-03-11 ENCOUNTER — Encounter: Payer: Self-pay | Admitting: Family Medicine

## 2015-03-19 DIAGNOSIS — Z01810 Encounter for preprocedural cardiovascular examination: Secondary | ICD-10-CM | POA: Diagnosis not present

## 2015-03-19 DIAGNOSIS — E7800 Pure hypercholesterolemia, unspecified: Secondary | ICD-10-CM | POA: Diagnosis not present

## 2015-03-19 DIAGNOSIS — R9431 Abnormal electrocardiogram [ECG] [EKG]: Secondary | ICD-10-CM | POA: Diagnosis not present

## 2015-03-19 DIAGNOSIS — I10 Essential (primary) hypertension: Secondary | ICD-10-CM | POA: Diagnosis not present

## 2015-04-16 DIAGNOSIS — B029 Zoster without complications: Secondary | ICD-10-CM | POA: Diagnosis not present

## 2015-06-01 DIAGNOSIS — E785 Hyperlipidemia, unspecified: Secondary | ICD-10-CM | POA: Diagnosis not present

## 2015-06-01 DIAGNOSIS — R112 Nausea with vomiting, unspecified: Secondary | ICD-10-CM | POA: Diagnosis not present

## 2015-06-01 DIAGNOSIS — R220 Localized swelling, mass and lump, head: Secondary | ICD-10-CM | POA: Diagnosis not present

## 2015-06-01 DIAGNOSIS — R197 Diarrhea, unspecified: Secondary | ICD-10-CM | POA: Diagnosis not present

## 2015-06-01 DIAGNOSIS — Z23 Encounter for immunization: Secondary | ICD-10-CM | POA: Diagnosis not present

## 2015-06-01 DIAGNOSIS — R55 Syncope and collapse: Secondary | ICD-10-CM | POA: Diagnosis not present

## 2015-06-01 DIAGNOSIS — I10 Essential (primary) hypertension: Secondary | ICD-10-CM | POA: Diagnosis not present

## 2015-06-01 DIAGNOSIS — S00212A Abrasion of left eyelid and periocular area, initial encounter: Secondary | ICD-10-CM | POA: Diagnosis not present

## 2015-06-01 DIAGNOSIS — Z7982 Long term (current) use of aspirin: Secondary | ICD-10-CM | POA: Diagnosis not present

## 2015-06-01 DIAGNOSIS — R404 Transient alteration of awareness: Secondary | ICD-10-CM | POA: Diagnosis not present

## 2015-06-01 DIAGNOSIS — S0990XA Unspecified injury of head, initial encounter: Secondary | ICD-10-CM | POA: Diagnosis not present

## 2015-06-12 DIAGNOSIS — E7800 Pure hypercholesterolemia, unspecified: Secondary | ICD-10-CM | POA: Diagnosis not present

## 2015-06-12 DIAGNOSIS — I10 Essential (primary) hypertension: Secondary | ICD-10-CM | POA: Diagnosis not present

## 2015-06-23 DIAGNOSIS — Z1231 Encounter for screening mammogram for malignant neoplasm of breast: Secondary | ICD-10-CM | POA: Diagnosis not present

## 2015-06-23 DIAGNOSIS — R9431 Abnormal electrocardiogram [ECG] [EKG]: Secondary | ICD-10-CM | POA: Diagnosis not present

## 2015-06-23 DIAGNOSIS — I5032 Chronic diastolic (congestive) heart failure: Secondary | ICD-10-CM | POA: Diagnosis not present

## 2015-06-23 DIAGNOSIS — E7800 Pure hypercholesterolemia, unspecified: Secondary | ICD-10-CM | POA: Diagnosis not present

## 2015-06-23 DIAGNOSIS — I10 Essential (primary) hypertension: Secondary | ICD-10-CM | POA: Diagnosis not present

## 2015-06-23 LAB — HM MAMMOGRAPHY

## 2015-07-08 ENCOUNTER — Encounter: Payer: Self-pay | Admitting: Family Medicine

## 2015-07-09 DIAGNOSIS — H2513 Age-related nuclear cataract, bilateral: Secondary | ICD-10-CM | POA: Diagnosis not present

## 2015-07-09 DIAGNOSIS — H348112 Central retinal vein occlusion, right eye, stable: Secondary | ICD-10-CM | POA: Diagnosis not present

## 2015-07-09 DIAGNOSIS — H31091 Other chorioretinal scars, right eye: Secondary | ICD-10-CM | POA: Diagnosis not present

## 2015-07-09 DIAGNOSIS — H353130 Nonexudative age-related macular degeneration, bilateral, stage unspecified: Secondary | ICD-10-CM | POA: Diagnosis not present

## 2015-07-09 DIAGNOSIS — H43812 Vitreous degeneration, left eye: Secondary | ICD-10-CM | POA: Diagnosis not present

## 2015-08-10 ENCOUNTER — Other Ambulatory Visit: Payer: Self-pay

## 2015-08-10 ENCOUNTER — Telehealth: Payer: Self-pay | Admitting: Family Medicine

## 2015-08-10 MED ORDER — LISINOPRIL-HYDROCHLOROTHIAZIDE 10-12.5 MG PO TABS: 1.0000 | ORAL_TABLET | Freq: Every day | ORAL | Status: DC

## 2015-08-10 NOTE — Telephone Encounter (Signed)
Pt called and needs a refill on her lisinopril pt made a cpe for July 26th but will run out 2 weeks before her cpe. Pt uses [id number] - [place], [address] AT [place] [address] pt can be reached at [phone] (M)

## 2015-08-10 NOTE — Telephone Encounter (Signed)
Sent med in 

## 2015-09-15 ENCOUNTER — Ambulatory Visit (INDEPENDENT_AMBULATORY_CARE_PROVIDER_SITE_OTHER): Payer: Medicare Other | Admitting: Family Medicine

## 2015-09-15 ENCOUNTER — Encounter: Payer: Self-pay | Admitting: Family Medicine

## 2015-09-15 VITALS — BP 122/72 | HR 80 | Ht 62.0 in | Wt 209.0 lb

## 2015-09-15 DIAGNOSIS — Z96649 Presence of unspecified artificial hip joint: Secondary | ICD-10-CM | POA: Diagnosis not present

## 2015-09-15 DIAGNOSIS — Z1159 Encounter for screening for other viral diseases: Secondary | ICD-10-CM | POA: Diagnosis not present

## 2015-09-15 DIAGNOSIS — E785 Hyperlipidemia, unspecified: Secondary | ICD-10-CM

## 2015-09-15 DIAGNOSIS — K219 Gastro-esophageal reflux disease without esophagitis: Secondary | ICD-10-CM | POA: Diagnosis not present

## 2015-09-15 DIAGNOSIS — J4520 Mild intermittent asthma, uncomplicated: Secondary | ICD-10-CM

## 2015-09-15 DIAGNOSIS — Z23 Encounter for immunization: Secondary | ICD-10-CM

## 2015-09-15 DIAGNOSIS — Z7289 Other problems related to lifestyle: Secondary | ICD-10-CM | POA: Diagnosis not present

## 2015-09-15 DIAGNOSIS — I10 Essential (primary) hypertension: Secondary | ICD-10-CM

## 2015-09-15 DIAGNOSIS — J301 Allergic rhinitis due to pollen: Secondary | ICD-10-CM | POA: Diagnosis not present

## 2015-09-15 DIAGNOSIS — K429 Umbilical hernia without obstruction or gangrene: Secondary | ICD-10-CM

## 2015-09-15 DIAGNOSIS — Z96641 Presence of right artificial hip joint: Secondary | ICD-10-CM

## 2015-09-15 LAB — COMPREHENSIVE METABOLIC PANEL
ALBUMIN: 4.3 g/dL (ref 3.6–5.1)
ALK PHOS: 114 U/L (ref 33–130)
ALT: 17 U/L (ref 6–29)
AST: 20 U/L (ref 10–35)
BUN: 22 mg/dL (ref 7–25)
CO2: 31 mmol/L (ref 20–31)
CREATININE: 0.84 mg/dL (ref 0.60–0.93)
Calcium: 10.5 mg/dL — ABNORMAL HIGH (ref 8.6–10.4)
Chloride: 101 mmol/L (ref 98–110)
Glucose, Bld: 89 mg/dL (ref 65–99)
Potassium: 4.1 mmol/L (ref 3.5–5.3)
SODIUM: 141 mmol/L (ref 135–146)
TOTAL PROTEIN: 7.2 g/dL (ref 6.1–8.1)
Total Bilirubin: 0.8 mg/dL (ref 0.2–1.2)

## 2015-09-15 LAB — LIPID PANEL
CHOL/HDL RATIO: 2.7 ratio (ref <=5.0)
CHOLESTEROL: 153 mg/dL (ref 125–200)
HDL: 57 mg/dL (ref >=46)
LDL Cholesterol: 76 mg/dL (ref <130)
Triglycerides: 98 mg/dL (ref <150)
VLDL: 20 mg/dL (ref <30)

## 2015-09-15 LAB — CBC WITH DIFFERENTIAL/PLATELET
BASOS ABS: 55 {cells}/uL (ref 0–200)
BASOS PCT: 1 %
EOS ABS: 220 {cells}/uL (ref 15–500)
Eosinophils Relative: 4 %
HCT: 37.9 % (ref 35.0–45.0)
HEMOGLOBIN: 12.3 g/dL (ref 11.7–15.5)
LYMPHS ABS: 1870 {cells}/uL (ref 850–3900)
Lymphocytes Relative: 34 %
MCH: 27.5 pg (ref 27.0–33.0)
MCHC: 32.5 g/dL (ref 32.0–36.0)
MCV: 84.8 fL (ref 80.0–100.0)
MPV: 9.6 fL (ref 7.5–12.5)
Monocytes Absolute: 330 cells/uL (ref 200–950)
Monocytes Relative: 6 %
NEUTROS ABS: 3025 {cells}/uL (ref 1500–7800)
Neutrophils Relative %: 55 %
Platelets: 237 10*3/uL (ref 140–400)
RBC: 4.47 MIL/uL (ref 3.80–5.10)
RDW: 14.7 % (ref 11.0–15.0)
WBC: 5.5 10*3/uL (ref 4.0–10.5)

## 2015-09-15 NOTE — Progress Notes (Signed)
Subjective:   HPI  Margaret Reese is a 70 y.o. female who presents for anAnnual wellness visit as well as medication management.  Medical care team includes:  Ganji cardio  Elwyn Reach   Preventative care: Last ophthalmology visit:07/2015 Last dental visit: 05/2015 Last colonoscopy:06/08/09 Last mammogram: 5//2/17 Last gynecological exam: Last EKG:01/22/15 Last labs:09/04/14  Prior vaccinations: TD or Tdap:09/17/2004 Influenza:12/08/14 Pneumococcal: 23: 09/17/04 09/11/12 Shingles/Zostavax:12/16/09 Other:-   Advanced directive: Information given  Concerns: She has no particular concerns or complaints. Her allergies seem to be under good control. She rarely will have difficulty with asthma. She continues on her lisinopril/HCTZ as well as simvastatin. She is having no difficulty with either of these medications. She has underlying reflux disease but rarely uses any medication for this. She has had a recent right hip replacement and is doing quite nicely and very happy with the results that she has. She does plan to become more physically active.  Reviewed their medical, surgical, family, social, medication, and allergy history and updated chart as appropriate.    Review of Systems Constitutional: -fever, -chills, -sweats, -unexpected weight change, -decreased appetite, -fatigue Allergy: -sneezing, -itching, -congestion Dermatology: -changing moles, --rash, -lumps ENT: -runny nose, -ear pain, -sore throat, -hoarseness, -sinus pain, -teeth pain, - ringing in ears, -hearing loss, -nosebleeds Cardiology: -chest pain, -palpitations, -swelling, -difficulty breathing when lying flat, -waking up short of breath Respiratory: -cough, -shortness of breath, -difficulty breathing with exercise or exertion, -wheezing, -coughing up blood Gastroenterology: -abdominal pain, -nausea, -vomiting, -diarrhea, -constipation, -blood in stool, -changes in bowel movement, -difficulty swallowing or  eating Hematology: -bleeding, -bruising  Musculoskeletal: -joint aches, -muscle aches, -joint swelling, -back pain, -neck pain, -cramping, -changes in gait Ophthalmology: denies vision changes, eye redness, itching, discharge Urology: -burning with urination, -difficulty urinating, -blood in urine, -urinary frequency, -urgency, -incontinence Neurology: -headache, -weakness, -tingling, -numbness, -memory loss, -falls, -dizziness Psychology: -depressed mood, -agitation, -sleep problems     Objective:   Physical Exam General appearance: alert, no distress, WD/WN,  Skin: WNL HEENT: normocephalic, conjunctiva/corneas normal, sclerae anicteric, PERRLA, EOMi, nares patent, no discharge or erythema, pharynx normal Oral cavity: MMM, tongue normal, teeth normal Neck: supple, no lymphadenopathy, no thyromegaly, no masses, normal ROM Chest: non tender, normal shape and expansion Heart: RRR, normal S1, S2, no murmurs Lungs: CTA bilaterally, no wheezes, rhonchi, or rales Abdomen: +bs, soft, non tender, non distended, no masses, no hepatomegaly, no splenomegaly, no bruits umbilical hernia noted Musculoskeletal: upper extremities non tender, no obvious deformity, normal ROM throughout, lower extremities non tender, no obvious deformity, normal ROM throughout Extremities: no edema, no cyanosis, no clubbing Pulses: 2+ symmetric, upper and lower extremities, normal cap refill Neurological: alert, oriented x 3, CN2-12 intact, strength normal upper extremities and lower extremities, sensation normal throughout, DTRs 2+ throughout, no cerebellar signs, gait normal Psychiatric: normal affect, behavior normal, pleasant     Assessment and Plan :  Essential hypertension - Plan: CBC with Differential/Platelet, Comprehensive metabolic panel  Gastroesophageal reflux disease without esophagitis  Seasonal allergic rhinitis due to pollen  Asthma, mild intermittent, uncomplicated  Obesity, Class III, BMI 40-49.9  (morbid obesity) ([place]) - Plan: CBC with Differential/Platelet, Comprehensive metabolic panel, Lipid panel  Status post total replacement of right hip  Hyperlipidemia - Plan: Lipid panel  Need for hepatitis C screening test - Plan: Hepatitis C antibody  Need for prophylactic vaccination against Streptococcus pneumoniae (pneumococcus) - Plan: Pneumococcal conjugate vaccine 13-valent  Other problems related to lifestyle  Umbilical hernia without obstruction and without gangrene  Physical exam - discussed healthy lifestyle, diet, exercise, preventative care, vaccinations, and addressed their concerns.  Handout given. Follow-up yearly

## 2015-09-16 LAB — HEPATITIS C ANTIBODY: HCV AB: NEGATIVE

## 2015-09-20 DIAGNOSIS — L309 Dermatitis, unspecified: Secondary | ICD-10-CM | POA: Diagnosis not present

## 2015-09-25 ENCOUNTER — Telehealth: Payer: Self-pay | Admitting: Family Medicine

## 2015-09-25 NOTE — Telephone Encounter (Signed)
Pt called to let Dr Redmond School know that she had a tetnus/diptheria vaccine on 06/01/15 in [place] so that her records could be updated.

## 2015-09-25 NOTE — Telephone Encounter (Signed)
I have updated her chart.

## 2015-10-22 DIAGNOSIS — N200 Calculus of kidney: Secondary | ICD-10-CM | POA: Diagnosis not present

## 2015-10-22 DIAGNOSIS — R3915 Urgency of urination: Secondary | ICD-10-CM | POA: Diagnosis not present

## 2015-10-22 DIAGNOSIS — N281 Cyst of kidney, acquired: Secondary | ICD-10-CM | POA: Diagnosis not present

## 2015-10-29 DIAGNOSIS — H2513 Age-related nuclear cataract, bilateral: Secondary | ICD-10-CM | POA: Diagnosis not present

## 2015-10-29 DIAGNOSIS — H353121 Nonexudative age-related macular degeneration, left eye, early dry stage: Secondary | ICD-10-CM | POA: Diagnosis not present

## 2015-10-29 DIAGNOSIS — H02834 Dermatochalasis of left upper eyelid: Secondary | ICD-10-CM | POA: Diagnosis not present

## 2015-10-29 DIAGNOSIS — H31091 Other chorioretinal scars, right eye: Secondary | ICD-10-CM | POA: Diagnosis not present

## 2015-10-29 DIAGNOSIS — H04123 Dry eye syndrome of bilateral lacrimal glands: Secondary | ICD-10-CM | POA: Diagnosis not present

## 2015-10-29 DIAGNOSIS — H02831 Dermatochalasis of right upper eyelid: Secondary | ICD-10-CM | POA: Diagnosis not present

## 2015-10-29 DIAGNOSIS — H348112 Central retinal vein occlusion, right eye, stable: Secondary | ICD-10-CM | POA: Diagnosis not present

## 2015-11-30 DIAGNOSIS — H2511 Age-related nuclear cataract, right eye: Secondary | ICD-10-CM | POA: Diagnosis not present

## 2015-12-07 DIAGNOSIS — Z23 Encounter for immunization: Secondary | ICD-10-CM | POA: Diagnosis not present

## 2015-12-10 DIAGNOSIS — H2512 Age-related nuclear cataract, left eye: Secondary | ICD-10-CM | POA: Diagnosis not present

## 2015-12-14 DIAGNOSIS — H2511 Age-related nuclear cataract, right eye: Secondary | ICD-10-CM | POA: Diagnosis not present

## 2015-12-14 DIAGNOSIS — H2512 Age-related nuclear cataract, left eye: Secondary | ICD-10-CM | POA: Diagnosis not present

## 2015-12-20 DIAGNOSIS — M62838 Other muscle spasm: Secondary | ICD-10-CM | POA: Diagnosis not present

## 2016-02-09 ENCOUNTER — Ambulatory Visit (INDEPENDENT_AMBULATORY_CARE_PROVIDER_SITE_OTHER): Payer: Medicare Other | Admitting: Physician Assistant

## 2016-02-09 ENCOUNTER — Ambulatory Visit (INDEPENDENT_AMBULATORY_CARE_PROVIDER_SITE_OTHER): Payer: Medicare Other

## 2016-02-09 DIAGNOSIS — Z96641 Presence of right artificial hip joint: Secondary | ICD-10-CM | POA: Diagnosis not present

## 2016-02-09 DIAGNOSIS — M1611 Unilateral primary osteoarthritis, right hip: Secondary | ICD-10-CM

## 2016-02-09 NOTE — Progress Notes (Signed)
Office Visit Note   Patient: Margaret Reese           Date of Birth: 1945-11-06           MRN: [id number] Visit Date: 02/09/2016              Requested by: Denita Lung, MD [id number] [place], [place] [address] PCP: Wyatt Haste, MD   Assessment & Plan: Visit Diagnoses:  1. History of total hip arthroplasty, right     Plan: Follow up as needed for any questions or concerns.  Follow-Up Instructions: Return if symptoms worsen or fail to improve.   Orders:  Orders Placed This Encounter  Procedures  . XR HIP UNILAT W OR W/O PELVIS 1V RIGHT   No orders of the defined types were placed in this encounter.     Procedures: No procedures performed   Clinical Data: No additional findings.   Subjective: Chief Complaint  Patient presents with  . Right Hip - Follow-up    Patient states he is doing great. Good ROM and strength    HPI Margaret Reese returns today 1 year status post right total hip arthroplasty she's doing well. The before meals her knee pain has totally resolved since having the hip replacement. Only complaint is with prolonged circumflex flexion of the hip with certain exercises causes some discomfort. Review of Systems  See history of present illness  Objective: Vital Signs: There were no vitals taken for this visit.  Physical Exam  Constitutional: She is oriented to person, place, and time. She appears well-developed and well-nourished. She appears distressed.  Neurological: She is alert and oriented to person, place, and time.  Psychiatric: She has a normal mood and affect.    Ortho Exam Good range of motion of right hip without pain ambulates without any assistive devices and a nonantalgic gait Specialty Comments:  No specialty comments available.  Imaging: Xr Hip Unilat W Or W/o Pelvis 1v Right  Result Date: 02/09/2016 AP pelvis and lateral view of the right hip: No acute fracture. Right total hip arthroplasty components  without any evidence of hardware failure or loosening. Leg lengths appear equal.    PMFS History: Patient Active Problem List   Diagnosis Date Noted  . Status post total replacement of right hip 01/30/2015  . Arthritis 09/11/2014  . Renal cyst 09/04/2014  . Allergic rhinitis due to pollen 09/04/2014  . Asthma, mild intermittent 09/04/2014  . Hypertension 07/08/2010  . Obesity, Class III, BMI 40-49.9 (morbid obesity) ([place]) 07/08/2010  . GERD (gastroesophageal reflux disease) 07/08/2010  . Calcium oxalate renal stones 07/08/2010  . Hyperlipidemia 07/08/2010   Past Medical History:  Diagnosis Date  . Allergy   . Arthritis   . Asthma   . Complication of anesthesia    pt states has difficulty awakening; also has increased sinus drainage  . Falls   . GERD (gastroesophageal reflux disease)   . H/O back injury   . History of bronchitis   . History of colon polyps   . Hypertension   . Imbalance   . Kidney cysts    pt states not sure which kidney does see kidney specialist yearly pt states every thing okay currently   . Legally blind in right eye, as defined in [place]   . Multiple gastric ulcers   . Obesity   . Renal stone   . Retinal vein occlusion   . Tingling    left arm   . Trace cataracts   .  Urinary frequency     Family History  Problem Relation Age of Onset  . Depression Mother   . Asthma Brother     Past Surgical History:  Procedure Laterality Date  . CARDIOVASCULAR STRESS TEST  dec 2016  . CHOLECYSTECTOMY    . COLONOSCOPY  2011  . EYE SURGERY    . TONSILLECTOMY    . TOTAL HIP ARTHROPLASTY Right 01/30/2015   Procedure: RIGHT TOTAL HIP ARTHROPLASTY ANTERIOR APPROACH AND REMOVAL LIPOMA RIGHT HIP;  Surgeon: Mcarthur Rossetti, MD;  Location: [facility];  Service: Orthopedics;  Laterality: Right;  . UPPER GI ENDOSCOPY     Social History   Occupational History  . Not on file.   Social History Main Topics  . Smoking status: Never Smoker  . Smokeless tobacco:  Never Used  . Alcohol use No  . Drug use: No  . Sexual activity: Yes

## 2016-05-18 DIAGNOSIS — I10 Essential (primary) hypertension: Secondary | ICD-10-CM | POA: Diagnosis not present

## 2016-05-18 DIAGNOSIS — R55 Syncope and collapse: Secondary | ICD-10-CM | POA: Diagnosis not present

## 2016-05-18 DIAGNOSIS — R001 Bradycardia, unspecified: Secondary | ICD-10-CM | POA: Diagnosis not present

## 2016-05-19 ENCOUNTER — Inpatient Hospital Stay (HOSPITAL_COMMUNITY): Payer: Medicare Other

## 2016-05-19 ENCOUNTER — Encounter (HOSPITAL_COMMUNITY): Payer: Self-pay

## 2016-05-19 ENCOUNTER — Encounter (HOSPITAL_COMMUNITY)
Admission: EM | Disposition: A | Discharge status: Home / Self Care | Payer: Self-pay | Source: Home / Self Care | Attending: Cardiovascular Disease

## 2016-05-19 ENCOUNTER — Inpatient Hospital Stay (HOSPITAL_COMMUNITY)
Admission: EM | Admit: 2016-05-19 | Discharge: 2016-05-21 | DRG: 244 | Disposition: A | Discharge status: Home / Self Care | Payer: Medicare Other | Attending: Cardiovascular Disease | Admitting: Cardiovascular Disease

## 2016-05-19 DIAGNOSIS — R05 Cough: Secondary | ICD-10-CM | POA: Diagnosis not present

## 2016-05-19 DIAGNOSIS — K219 Gastro-esophageal reflux disease without esophagitis: Secondary | ICD-10-CM | POA: Diagnosis present

## 2016-05-19 DIAGNOSIS — R001 Bradycardia, unspecified: Secondary | ICD-10-CM | POA: Diagnosis not present

## 2016-05-19 DIAGNOSIS — R55 Syncope and collapse: Secondary | ICD-10-CM | POA: Diagnosis not present

## 2016-05-19 DIAGNOSIS — Z950 Presence of cardiac pacemaker: Secondary | ICD-10-CM | POA: Diagnosis not present

## 2016-05-19 DIAGNOSIS — R079 Chest pain, unspecified: Secondary | ICD-10-CM | POA: Diagnosis not present

## 2016-05-19 DIAGNOSIS — Z96641 Presence of right artificial hip joint: Secondary | ICD-10-CM | POA: Diagnosis present

## 2016-05-19 DIAGNOSIS — I444 Left anterior fascicular block: Secondary | ICD-10-CM | POA: Diagnosis present

## 2016-05-19 DIAGNOSIS — M549 Dorsalgia, unspecified: Secondary | ICD-10-CM | POA: Diagnosis not present

## 2016-05-19 DIAGNOSIS — E785 Hyperlipidemia, unspecified: Secondary | ICD-10-CM | POA: Diagnosis present

## 2016-05-19 DIAGNOSIS — I4510 Unspecified right bundle-branch block: Secondary | ICD-10-CM | POA: Diagnosis present

## 2016-05-19 DIAGNOSIS — I422 Other hypertrophic cardiomyopathy: Secondary | ICD-10-CM | POA: Diagnosis present

## 2016-05-19 DIAGNOSIS — J45909 Unspecified asthma, uncomplicated: Secondary | ICD-10-CM | POA: Diagnosis not present

## 2016-05-19 DIAGNOSIS — H5440 Blindness, one eye, unspecified eye: Secondary | ICD-10-CM | POA: Diagnosis present

## 2016-05-19 DIAGNOSIS — Z95818 Presence of other cardiac implants and grafts: Secondary | ICD-10-CM

## 2016-05-19 DIAGNOSIS — Z6838 Body mass index (BMI) 38.0-38.9, adult: Secondary | ICD-10-CM | POA: Diagnosis not present

## 2016-05-19 DIAGNOSIS — N200 Calculus of kidney: Secondary | ICD-10-CM

## 2016-05-19 DIAGNOSIS — I441 Atrioventricular block, second degree: Secondary | ICD-10-CM | POA: Diagnosis not present

## 2016-05-19 DIAGNOSIS — I4519 Other right bundle-branch block: Secondary | ICD-10-CM | POA: Diagnosis not present

## 2016-05-19 DIAGNOSIS — I10 Essential (primary) hypertension: Secondary | ICD-10-CM | POA: Diagnosis not present

## 2016-05-19 DIAGNOSIS — Z8711 Personal history of peptic ulcer disease: Secondary | ICD-10-CM

## 2016-05-19 DIAGNOSIS — R0789 Other chest pain: Secondary | ICD-10-CM | POA: Diagnosis not present

## 2016-05-19 HISTORY — PX: LEFT HEART CATH AND CORONARY ANGIOGRAPHY: CATH118249

## 2016-05-19 LAB — CBC
HCT: 37.4 % (ref 36.0–46.0)
HEMATOCRIT: 35.5 % — AB (ref 36.0–46.0)
Hemoglobin: 11.9 g/dL — ABNORMAL LOW (ref 12.0–15.0)
Hemoglobin: 11.4 g/dL — ABNORMAL LOW (ref 12.0–15.0)
MCH: 27.9 pg (ref 26.0–34.0)
MCH: 27.9 pg (ref 26.0–34.0)
MCHC: 31.8 g/dL (ref 30.0–36.0)
MCHC: 32.1 g/dL (ref 30.0–36.0)
MCV: 87.8 fL (ref 78.0–100.0)
MCV: 86.8 fL (ref 78.0–100.0)
Platelets: 209 10*3/uL (ref 150–400)
Platelets: 181 10*3/uL (ref 150–400)
RBC: 4.26 MIL/uL (ref 3.87–5.11)
RBC: 4.09 MIL/uL (ref 3.87–5.11)
RDW: 13.9 % (ref 11.5–15.5)
RDW: 13.7 % (ref 11.5–15.5)
WBC: 7 10*3/uL (ref 4.0–10.5)
WBC: 6.4 10*3/uL (ref 4.0–10.5)

## 2016-05-19 LAB — BASIC METABOLIC PANEL
Anion gap: 8 (ref 5–15)
BUN: 21 mg/dL — AB (ref 6–20)
CALCIUM: 10.1 mg/dL (ref 8.9–10.3)
CHLORIDE: 103 mmol/L (ref 101–111)
CO2: 30 mmol/L (ref 22–32)
CREATININE: 1.19 mg/dL — AB (ref 0.44–1.00)
GFR calc non Af Amer: 45 mL/min — ABNORMAL LOW (ref >60)
GFR, EST AFRICAN AMERICAN: 52 mL/min — AB (ref >60)
GLUCOSE: 90 mg/dL (ref 65–99)
Potassium: 3.9 mmol/L (ref 3.5–5.1)
Sodium: 141 mmol/L (ref 135–145)

## 2016-05-19 LAB — PROTIME-INR
INR: 1.09
PROTHROMBIN TIME: 14.2 s (ref 11.4–15.2)

## 2016-05-19 LAB — URINALYSIS, ROUTINE W REFLEX MICROSCOPIC
Bilirubin Urine: NEGATIVE
Glucose, UA: NEGATIVE mg/dL
HGB URINE DIPSTICK: NEGATIVE
Ketones, ur: NEGATIVE mg/dL
Leukocytes, UA: NEGATIVE
Nitrite: NEGATIVE
Protein, ur: NEGATIVE mg/dL
SPECIFIC GRAVITY, URINE: 1.012 (ref 1.005–1.030)
pH: 5 (ref 5.0–8.0)

## 2016-05-19 LAB — POCT ACTIVATED CLOTTING TIME: Activated Clotting Time: 131 seconds

## 2016-05-19 LAB — TSH: TSH: 1.36 u[IU]/mL (ref 0.350–4.500)

## 2016-05-19 LAB — CREATININE, SERUM
CREATININE: 1.05 mg/dL — AB (ref 0.44–1.00)
GFR calc Af Amer: >60 mL/min (ref >60)
GFR, EST NON AFRICAN AMERICAN: 52 mL/min — AB (ref >60)

## 2016-05-19 SURGERY — LEFT HEART CATH AND CORONARY ANGIOGRAPHY
Anesthesia: LOCAL

## 2016-05-19 MED ORDER — HEPARIN (PORCINE) IN NACL 2-0.9 UNIT/ML-% IJ SOLN
INTRAMUSCULAR | Status: DC | PRN
  Administered 2016-05-19 (×2): 1000 mL

## 2016-05-19 MED ORDER — OMEGA-3-ACID ETHYL ESTERS 1 G PO CAPS
1.0000 g | ORAL_CAPSULE | Freq: Every day | ORAL | Status: DC
  Administered 2016-05-21: 1 g via ORAL
  Filled 2016-05-19: qty 1

## 2016-05-19 MED ORDER — MIDAZOLAM HCL 2 MG/2ML IJ SOLN
INTRAMUSCULAR | Status: DC | PRN
  Administered 2016-05-19: 1 mg via INTRAVENOUS

## 2016-05-19 MED ORDER — LISINOPRIL 10 MG PO TABS
10.0000 mg | ORAL_TABLET | Freq: Every day | ORAL | Status: DC
  Administered 2016-05-20 – 2016-05-21 (×2): 10 mg via ORAL
  Filled 2016-05-19 (×2): qty 1

## 2016-05-19 MED ORDER — LIDOCAINE HCL (PF) 1 % IJ SOLN
INTRAMUSCULAR | Status: DC | PRN
  Administered 2016-05-19: 20 mL

## 2016-05-19 MED ORDER — SODIUM CHLORIDE 0.9 % IV SOLN
INTRAVENOUS | Status: DC
  Administered 2016-05-19 – 2016-05-20 (×2): via INTRAVENOUS

## 2016-05-19 MED ORDER — IOPAMIDOL (ISOVUE-370) INJECTION 76%
INTRAVENOUS | Status: DC | PRN
  Administered 2016-05-19: 30 mL via INTRA_ARTERIAL

## 2016-05-19 MED ORDER — SODIUM CHLORIDE 0.9 % IV SOLN
INTRAVENOUS | Status: DC
  Administered 2016-05-19: 17:00:00 via INTRAVENOUS

## 2016-05-19 MED ORDER — HEPARIN SODIUM (PORCINE) 5000 UNIT/ML IJ SOLN
5000.0000 [IU] | Freq: Three times a day (TID) | INTRAMUSCULAR | Status: DC
  Administered 2016-05-19: 5000 [IU] via SUBCUTANEOUS
  Filled 2016-05-19: qty 1

## 2016-05-19 MED ORDER — FENTANYL CITRATE (PF) 100 MCG/2ML IJ SOLN
INTRAMUSCULAR | Status: DC | PRN
  Administered 2016-05-19 (×2): 25 ug via INTRAVENOUS

## 2016-05-19 MED ORDER — OCUVITE-LUTEIN PO CAPS
1.0000 | ORAL_CAPSULE | Freq: Two times a day (BID) | ORAL | Status: DC
  Administered 2016-05-19: 1 via ORAL
  Filled 2016-05-19 (×2): qty 1

## 2016-05-19 MED ORDER — IOPAMIDOL (ISOVUE-370) INJECTION 76%
INTRAVENOUS | Status: AC
  Filled 2016-05-19: qty 100

## 2016-05-19 MED ORDER — HYDROCHLOROTHIAZIDE 12.5 MG PO CAPS
12.5000 mg | ORAL_CAPSULE | Freq: Every day | ORAL | Status: DC
  Administered 2016-05-20 – 2016-05-21 (×2): 12.5 mg via ORAL
  Filled 2016-05-19 (×2): qty 1

## 2016-05-19 MED ORDER — LIDOCAINE HCL (PF) 1 % IJ SOLN
INTRAMUSCULAR | Status: AC
  Filled 2016-05-19: qty 30

## 2016-05-19 MED ORDER — LISINOPRIL-HYDROCHLOROTHIAZIDE 10-12.5 MG PO TABS: 1.0000 | ORAL_TABLET | Freq: Every day | ORAL | Status: DC

## 2016-05-19 MED ORDER — SIMVASTATIN 20 MG PO TABS
20.0000 mg | ORAL_TABLET | Freq: Every day | ORAL | Status: DC
  Administered 2016-05-19 – 2016-05-21 (×2): 20 mg via ORAL
  Filled 2016-05-19 (×2): qty 1

## 2016-05-19 MED ORDER — VITAMIN D3 25 MCG (1000 UNIT) PO TABS
1000.0000 [IU] | ORAL_TABLET | Freq: Every evening | ORAL | Status: DC
  Administered 2016-05-19: 1000 [IU] via ORAL
  Filled 2016-05-19 (×3): qty 1

## 2016-05-19 MED ORDER — MIDAZOLAM HCL 2 MG/2ML IJ SOLN
INTRAMUSCULAR | Status: AC
  Filled 2016-05-19: qty 2

## 2016-05-19 MED ORDER — HEPARIN (PORCINE) IN NACL 2-0.9 UNIT/ML-% IJ SOLN
INTRAMUSCULAR | Status: AC
  Filled 2016-05-19: qty 1000

## 2016-05-19 MED ORDER — FENTANYL CITRATE (PF) 100 MCG/2ML IJ SOLN
INTRAMUSCULAR | Status: AC
  Filled 2016-05-19: qty 2

## 2016-05-19 SURGICAL SUPPLY — 11 items
CABLE ADAPT CONN TEMP 6FT (ADAPTER) ×9 IMPLANT
CATH INFINITI 5FR MULTPACK ANG (CATHETERS) ×3 IMPLANT
CATH S G BIP PACING (SET/KITS/TRAYS/PACK) ×3 IMPLANT
KIT HEART LEFT (KITS) ×3 IMPLANT
NEEDLE SMART 18GX3.5CM LONG (NEEDLE) ×3 IMPLANT
PACK CARDIAC CATHETERIZATION (CUSTOM PROCEDURE TRAY) ×3 IMPLANT
SHEATH PINNACLE 5F 10CM (SHEATH) ×3 IMPLANT
SHEATH PINNACLE 6F 10CM (SHEATH) ×3 IMPLANT
SLEEVE REPOSITIONING LENGTH 30 (MISCELLANEOUS) ×3 IMPLANT
TRANSDUCER W/STOPCOCK (MISCELLANEOUS) ×3 IMPLANT
WIRE EMERALD 3MM-J .035X150CM (WIRE) ×3 IMPLANT

## 2016-05-19 NOTE — Progress Notes (Signed)
Upon patient admission to unit, this RN noticed hematoma at right leg. RN and MD held pressure for 20 minutes and is now soft to palpation. RN drew line around similar spot on patient right groin. Will continue to monitor closely.

## 2016-05-19 NOTE — ED Provider Notes (Signed)
[facility] DEPT Provider Note   CSN: [id number] Arrival date & time: 05/19/16  1229     History   Chief Complaint Chief Complaint  Patient presents with  . Loss of Consciousness    HPI BEA DUREN is a 71 y.o. female.  Patient presents with episode of syncope that occurred yesterday after exertion after running errands around the house. She experienced some dizziness and SOB before the incident. She fell and hit her right arm and back of her head. Describes a painful knot on back of head on L side. Since then she has also been having a dull, "sinus-like" pressure through her head. She has had 1 episode of diarrhea today. Denies changes in memory or orientation. She denies any vision changes, weakness, chest pain, leg swelling, hemoptysis, cough, nausea or vomiting. Reports compliance with medication. Of note, her HR has been around 40s today.      Past Medical History:  Diagnosis Date  . Allergy   . Arthritis   . Asthma   . Complication of anesthesia    pt states has difficulty awakening; also has increased sinus drainage  . Falls   . GERD (gastroesophageal reflux disease)   . H/O back injury   . History of bronchitis   . History of colon polyps   . Hypertension   . Imbalance   . Kidney cysts    pt states not sure which kidney does see kidney specialist yearly pt states every thing okay currently   . Legally blind in right eye, as defined in [place]   . Multiple gastric ulcers   . Obesity   . Renal stone   . Retinal vein occlusion   . Tingling    left arm   . Trace cataracts   . Urinary frequency     Patient Active Problem List   Diagnosis Date Noted  . Syncope 05/19/2016  . Status post total replacement of right hip 01/30/2015  . Arthritis 09/11/2014  . Renal cyst 09/04/2014  . Allergic rhinitis due to pollen 09/04/2014  . Asthma, mild intermittent 09/04/2014  . Hypertension 07/08/2010  . Obesity, Class III, BMI 40-49.9 (morbid obesity) ([place])  07/08/2010  . GERD (gastroesophageal reflux disease) 07/08/2010  . Calcium oxalate renal stones 07/08/2010  . Hyperlipidemia 07/08/2010    Past Surgical History:  Procedure Laterality Date  . CARDIOVASCULAR STRESS TEST  dec 2016  . CHOLECYSTECTOMY    . COLONOSCOPY  2011  . EYE SURGERY    . TONSILLECTOMY    . TOTAL HIP ARTHROPLASTY Right 01/30/2015   Procedure: RIGHT TOTAL HIP ARTHROPLASTY ANTERIOR APPROACH AND REMOVAL LIPOMA RIGHT HIP;  Surgeon: Mcarthur Rossetti, MD;  Location: [facility];  Service: Orthopedics;  Laterality: Right;  . UPPER GI ENDOSCOPY      OB History    No data available       Home Medications    Prior to Admission medications   Medication Sig Start Date End Date Taking? Authorizing Provider  aspirin 81 MG tablet Take 81 mg by mouth daily.   Yes Historical Provider, MD  Cholecalciferol (VITAMIN D3) 1000 UNITS CAPS Take 1 capsule by mouth every evening.    Yes Historical Provider, MD  fish oil-omega-3 fatty acids 1000 MG capsule Take 1 g by mouth daily.    Yes Historical Provider, MD  lisinopril-hydrochlorothiazide (PRINZIDE,ZESTORETIC) 10-12.5 MG tablet Take 1 tablet by mouth daily. 08/10/15  Yes Denita Lung, MD  Multiple Vitamins-Minerals (PRESERVISION AREDS 2) CAPS Take 1 capsule  by mouth 2 (two) times daily.    Yes Historical Provider, MD  multivitamin [facility]) per tablet Take 1 tablet by mouth daily.     Yes Historical Provider, MD  potassium citrate (UROCIT-K) 5 MEQ (540 MG) SR tablet Take 1 tablet (5 mEq total) by mouth 2 (two) times daily. Patient taking differently: Take 10 mEq by mouth 2 (two) times daily.  08/10/11  Yes Denita Lung, MD  simvastatin (ZOCOR) 20 MG tablet Take 20 mg by mouth daily.   Yes Historical Provider, MD  Albuterol Sulfate (PROAIR RESPICLICK) 785 (90 BASE) MCG/ACT AEPB Inhale 2 application into the lungs 4 (four) times daily as needed. Patient not taking: Reported on 05/19/2016 09/04/14   Denita Lung, MD    Family  History Family History  Problem Relation Age of Onset  . Depression Mother   . Asthma Brother     Social History Social History  Substance Use Topics  . Smoking status: Never Smoker  . Smokeless tobacco: Never Used  . Alcohol use No     Allergies   Patient has no known allergies.   Review of Systems Review of Systems  Constitutional: Negative for appetite change, chills and fever.  HENT: Negative for ear pain, rhinorrhea, sneezing and sore throat.   Eyes: Positive for visual disturbance. Negative for photophobia.  Respiratory: Negative for cough, chest tightness, shortness of breath and wheezing.   Cardiovascular: Negative for chest pain and palpitations.  Gastrointestinal: Positive for diarrhea. Negative for abdominal pain, blood in stool, constipation, nausea and vomiting.  Genitourinary: Negative for dysuria, hematuria and urgency.  Musculoskeletal: Negative for myalgias.  Skin: Positive for wound. Negative for rash.  Neurological: Positive for syncope, light-headedness and headaches. Negative for dizziness and weakness.     Physical Exam Updated Vital Signs BP (!) 134/45 (BP Location: Right Arm)   Pulse (!) 41   Temp 97.7 F (36.5 C) (Oral)   Resp 18   Ht 5\' 3"  (1.6 m)   Wt 97.5 kg   SpO2 97%   BMI 38.09 kg/m   Physical Exam  Constitutional: She is oriented to person, place, and time. She appears well-developed and well-nourished. No distress.  HENT:  Head: Normocephalic. Head is with laceration.    Nose: Nose normal.  ~2cm superficial laceration on L side of back of head. Not actively bleeding.  Eyes: Conjunctivae and EOM are normal. Pupils are equal, round, and reactive to light. Right eye exhibits no discharge. Left eye exhibits no discharge. No scleral icterus.  Neck: Normal range of motion. Neck supple.  Cardiovascular: Regular rhythm and intact distal pulses.  Bradycardia present.  Exam reveals no gallop and no friction rub.   Murmur  heard. Pulmonary/Chest: Effort normal and breath sounds normal. No respiratory distress.  Abdominal: Soft. Bowel sounds are normal. She exhibits no distension. There is no tenderness. There is no guarding.  Musculoskeletal: Normal range of motion. She exhibits tenderness (Some tenderness at area of abrasion on L wrist but full ROM). She exhibits no edema.  Neurological: She is alert and oriented to person, place, and time. No sensory deficit. She exhibits normal muscle tone.  Skin: Skin is warm and dry. No rash noted.  Psychiatric: She has a normal mood and affect.  Nursing note and vitals reviewed.    ED Treatments / Results  Labs (all labs ordered are listed, but only abnormal results are displayed) Labs Reviewed  BASIC METABOLIC PANEL - Abnormal; Notable for the following:  Result Value   BUN 21 (*)    Creatinine, Ser 1.19 (*)    GFR calc non Af Amer 45 (*)    GFR calc Af Amer 52 (*)    All other components within normal limits  CBC - Abnormal; Notable for the following:    Hemoglobin 11.9 (*)    All other components within normal limits  CREATININE, SERUM - Abnormal; Notable for the following:    Creatinine, Ser 1.05 (*)    GFR calc non Af Amer 52 (*)    All other components within normal limits  URINALYSIS, ROUTINE W REFLEX MICROSCOPIC  TSH  PROTIME-INR  CBC  CBG MONITORING, ED    EKG  EKG Interpretation  Date/Time:  Thursday May 19 2016 12:52:04 EDT Ventricular Rate:  43 PR Interval:  180 QRS Duration: 130 QT Interval:  480 QTC Calculation: 405 R Axis:   -54 Text Interpretation: Critical Test Result: AV Block Sinus rhythm with 2nd degree A-V block with 2:1 A-V conduction Right bundle branch block Left anterior fascicular block  Bifascicular block  Confirmed by Ashok Cordia  MD, Lennette Bihari ([id number]) on 05/19/2016 2:03:25 PM       Radiology Dg Chest Port 1v Same Day  Result Date: 05/19/2016 CLINICAL DATA:  71 year old female with syncope yesterday. Cough today.  Initial encounter. EXAM: PORTABLE CHEST 1 VIEW COMPARISON:  Chest radiographs 01/21/2013 FINDINGS: Portable AP semi upright view at 1447 hours. Mildly lordotic positioning. Dextroconvex thoracic scoliosis. Stable mild cardiomegaly. Other mediastinal contours are within normal limits. Calcified aortic atherosclerosis. Visualized tracheal air column is within normal limits. Increased pulmonary vascularity but no overt edema. No pneumothorax, pleural effusion or consolidation. IMPRESSION: 1. Pulmonary vascular congestion without overt edema or other acute cardiopulmonary abnormality. 2. Stable chronic cardiomegaly.  Calcified aortic atherosclerosis. Electronically Signed   By: Genevie Ann M.D.   On: 05/19/2016 15:08    Procedures Procedures (including critical care time)  Medications Ordered in ED Medications  heparin injection 5,000 Units (not administered)  0.9 %  sodium chloride infusion ( Intravenous New Bag/Given 05/19/16 1506)     Initial Impression / Assessment and Plan / ED Course  I have reviewed the triage vital signs and the nursing notes.  Pertinent labs & imaging results that were available during my care of the patient were reviewed by me and considered in my medical decision making (see chart for details).     Patient's history and symptoms warrant concern for heart block vs. Possible ischemia vs. Vasovagal syncope. EKG showed 2nd degree heart block with HR 41. Cardiologist Dr. Doylene Canard spoke with patient. He advised her for possible temporary pacemaker placement and cardiac cath. She will need to be admitted for this.  Patient agreed to be admitted for procedures with cardiology service.    Final Clinical Impressions(s) / ED Diagnoses   Final diagnoses:  Syncope    New Prescriptions New Prescriptions   No medications on file     [place], [place] 05/19/16 [place], MD 05/19/16 1700

## 2016-05-19 NOTE — ED Triage Notes (Signed)
Per Pt, Pt is coming from PCP. Pt had an episode of syncope yesterday while cleaning the house. Pt reports fallng and hitting the left side of her head. Was seen at [facility] and requested to come here. Pt waited to see Cardiologist and was sent here from them after HR noted to be in the 40s. Pt is alert and oriented x4 upon arrival.

## 2016-05-19 NOTE — H&P (Signed)
Referring Physician: Clayton Lefort, MD  Margaret Reese is an 71 y.o. female.                       Chief Complaint: Syncope  HPI: 71 year old female with hypertension, obesity, dyslipidemia has h/o syncopal episode yesterday with significant bradycardia and exertional shortness of breath. Patient blames it on increased stress lately. She is not on B-blocker or calcium channel blocker. She had right hip surgery little over a year ago.   Past Medical History:  Diagnosis Date  . Allergy   . Arthritis   . Asthma   . Complication of anesthesia    pt states has difficulty awakening; also has increased sinus drainage  . Falls   . GERD (gastroesophageal reflux disease)   . H/O back injury   . History of bronchitis   . History of colon polyps   . Hypertension   . Imbalance   . Kidney cysts    pt states not sure which kidney does see kidney specialist yearly pt states every thing okay currently   . Legally blind in right eye, as defined in [place]   . Multiple gastric ulcers   . Obesity   . Renal stone   . Retinal vein occlusion   . Tingling    left arm   . Trace cataracts   . Urinary frequency       Past Surgical History:  Procedure Laterality Date  . CARDIOVASCULAR STRESS TEST  dec 2016  . CHOLECYSTECTOMY    . COLONOSCOPY  2011  . EYE SURGERY    . TONSILLECTOMY    . TOTAL HIP ARTHROPLASTY Right 01/30/2015   Procedure: RIGHT TOTAL HIP ARTHROPLASTY ANTERIOR APPROACH AND REMOVAL LIPOMA RIGHT HIP;  Surgeon: Mcarthur Rossetti, MD;  Location: [facility];  Service: Orthopedics;  Laterality: Right;  . UPPER GI ENDOSCOPY      Family History  Problem Relation Age of Onset  . Depression Mother   . Asthma Brother    Social History:  reports that she has never smoked. She has never used smokeless tobacco. She reports that she does not drink alcohol or use drugs.  Allergies: No Known Allergies   (Not in a hospital admission)  Results for orders placed or performed during the hospital  encounter of 05/19/16 (from the past 48 hour(s))  Basic metabolic panel     Status: Abnormal   Collection Time: 05/19/16 12:42 PM  Result Value Ref Range   Sodium 141 135 - 145 mmol/L   Potassium 3.9 3.5 - 5.1 mmol/L   Chloride 103 101 - 111 mmol/L   CO2 30 22 - 32 mmol/L   Glucose, Bld 90 65 - 99 mg/dL   BUN 21 (H) 6 - 20 mg/dL   Creatinine, Ser 1.19 (H) 0.44 - 1.00 mg/dL   Calcium 10.1 8.9 - 10.3 mg/dL   GFR calc non Af Amer 45 (L) >60 mL/min   GFR calc Af Amer 52 (L) >60 mL/min    Comment: (NOTE) The eGFR has been calculated using the CKD EPI equation. This calculation has not been validated in all clinical situations. eGFR's persistently <60 mL/min signify possible Chronic Kidney Disease.    Anion gap 8 5 - 15  CBC     Status: Abnormal   Collection Time: 05/19/16 12:42 PM  Result Value Ref Range   WBC 7.0 4.0 - 10.5 K/uL   RBC 4.26 3.87 - 5.11 MIL/uL   Hemoglobin 11.9 (  L) 12.0 - 15.0 g/dL   HCT 37.4 36.0 - 46.0 %   MCV 87.8 78.0 - 100.0 fL   MCH 27.9 26.0 - 34.0 pg   MCHC 31.8 30.0 - 36.0 g/dL   RDW 13.9 11.5 - 15.5 %   Platelets 209 150 - 400 K/uL   No results found.  Review Of Systems Constitutional: No fever, chills , weight loss or gain. Eyes: Positive vision change, Wears glasses. No discharge or pain.. Ears: No hearing loss, No tinnitus. Respiratory: No asthma, COPD, pneumonias, or shortness of breath. No hemoptysis. Cardiovascular: No chest pain, palpitation or leg edema. Gastrointestinal: Positive nausea, vomiting or diarrhea or constipation. No GI bleed. No hepatitis. Genitourinary: No dysuria, hematuria or kidney stone. No incontinance. Neurological: No headache, stroke or seizures.  Psychiatry: No psych facility admission for anxiety, depression or suicide. No detox. Skin: No rash. Musculoskeletal: Positive joint pain. No fibromyalgia. No neck pain or back pain. Lymphadenopathy: No lymphadenopathy Hematology: No anemia or easy bruising.   Blood  pressure (!) 134/45, pulse (!) 41, temperature 97.7 F (36.5 C), temperature source Oral, resp. rate 18, height '5\' 3"'  (1.6 m), weight 97.5 kg (215 lb), SpO2 97 %. Body mass index is 38.09 kg/m. General appearance: alert, cooperative, appears stated age and no distress Head: Normocephalic, atraumatic. Eyes: Blue eyes, pink conjunctivae/corneas clear. PERRL, EOM's intact.  Neck: no adenopathy, no carotid bruit, no JVD, supple, symmetrical, trachea midline and thyroid not enlarged. Resp: clear to auscultation bilaterally Cardio: regular rate and rhythm, S1, S2 normal, II/VI systolic murmur, no click, rub or gallop GI: soft, non-tender; bowel sounds normal; no masses,  no organomegaly. Extremities: extremities normal, atraumatic, no cyanosis or edema Skin: Warm and dry. No rashes or lesions Neurologic: Alert and oriented X 3, normal strength and tone. Normal coordination and slow gait.  Assessment/Plan Syncope Atypical chest pain Sinus bradycardia with 2nd degree AV block, type II LAHB and RBBB Hypertension S/P right hip surgery  Admit. Cardiac cath + temporary pacemaker. Permanent pacemaker in AM if HR/rhythm do not improve as she has symptomatic bradycardia and near complete heart block. IV fluids.   Birdie Riddle, MD  05/19/2016, 2:49 PM

## 2016-05-20 ENCOUNTER — Encounter (HOSPITAL_COMMUNITY)
Admission: EM | Disposition: A | Discharge status: Home / Self Care | Payer: Self-pay | Source: Home / Self Care | Attending: Cardiovascular Disease

## 2016-05-20 ENCOUNTER — Inpatient Hospital Stay (HOSPITAL_COMMUNITY): Payer: Medicare Other

## 2016-05-20 ENCOUNTER — Encounter (HOSPITAL_COMMUNITY): Payer: Self-pay | Admitting: Cardiovascular Disease

## 2016-05-20 DIAGNOSIS — Z950 Presence of cardiac pacemaker: Secondary | ICD-10-CM

## 2016-05-20 DIAGNOSIS — I441 Atrioventricular block, second degree: Secondary | ICD-10-CM

## 2016-05-20 HISTORY — PX: PACEMAKER IMPLANT: EP1218

## 2016-05-20 HISTORY — DX: Presence of cardiac pacemaker: Z95.0

## 2016-05-20 LAB — BASIC METABOLIC PANEL
Anion gap: 7 (ref 5–15)
BUN: 17 mg/dL (ref 6–20)
CALCIUM: 8.9 mg/dL (ref 8.9–10.3)
CHLORIDE: 106 mmol/L (ref 101–111)
CO2: 28 mmol/L (ref 22–32)
CREATININE: 0.92 mg/dL (ref 0.44–1.00)
GFR calc non Af Amer: >60 mL/min (ref >60)
Glucose, Bld: 99 mg/dL (ref 65–99)
Potassium: 4.2 mmol/L (ref 3.5–5.1)
SODIUM: 141 mmol/L (ref 135–145)

## 2016-05-20 LAB — PROTIME-INR
INR: 1.16
PROTHROMBIN TIME: 14.8 s (ref 11.4–15.2)

## 2016-05-20 LAB — ECHOCARDIOGRAM COMPLETE
HEIGHTINCHES: 63 in
WEIGHTICAEL: 3439.18 [oz_av]

## 2016-05-20 LAB — CBC
HCT: 32.5 % — ABNORMAL LOW (ref 36.0–46.0)
Hemoglobin: 10.4 g/dL — ABNORMAL LOW (ref 12.0–15.0)
MCH: 27.8 pg (ref 26.0–34.0)
MCHC: 32 g/dL (ref 30.0–36.0)
MCV: 86.9 fL (ref 78.0–100.0)
PLATELETS: 178 10*3/uL (ref 150–400)
RBC: 3.74 MIL/uL — AB (ref 3.87–5.11)
RDW: 13.6 % (ref 11.5–15.5)
WBC: 7.7 10*3/uL (ref 4.0–10.5)

## 2016-05-20 LAB — MRSA PCR SCREENING: MRSA by PCR: NEGATIVE

## 2016-05-20 SURGERY — PACEMAKER IMPLANT
Anesthesia: LOCAL

## 2016-05-20 MED ORDER — CEFAZOLIN SODIUM-DEXTROSE 2-4 GM/100ML-% IV SOLN
2.0000 g | INTRAVENOUS | Status: DC
  Filled 2016-05-20: qty 100

## 2016-05-20 MED ORDER — CHLORHEXIDINE GLUCONATE 4 % EX LIQD
CUTANEOUS | Status: AC
  Administered 2016-05-20: 14:00:00
  Filled 2016-05-20: qty 15

## 2016-05-20 MED ORDER — LIDOCAINE HCL (PF) 1 % IJ SOLN
INTRAMUSCULAR | Status: AC
  Filled 2016-05-20: qty 60

## 2016-05-20 MED ORDER — SODIUM CHLORIDE 0.9 % IR SOLN
Status: AC
  Filled 2016-05-20: qty 2

## 2016-05-20 MED ORDER — OXYCODONE HCL 5 MG PO TABS
5.0000 mg | ORAL_TABLET | Freq: Four times a day (QID) | ORAL | Status: DC | PRN
  Administered 2016-05-20: 5 mg via ORAL
  Filled 2016-05-20: qty 1

## 2016-05-20 MED ORDER — CHLORHEXIDINE GLUCONATE 4 % EX LIQD
60.0000 mL | Freq: Once | CUTANEOUS | Status: AC
  Administered 2016-05-20: 4 via TOPICAL
  Filled 2016-05-20: qty 15

## 2016-05-20 MED ORDER — SODIUM CHLORIDE 0.9% FLUSH: 3.0000 mL | INTRAVENOUS | Status: DC | PRN

## 2016-05-20 MED ORDER — SODIUM CHLORIDE 0.9% FLUSH
3.0000 mL | Freq: Two times a day (BID) | INTRAVENOUS | Status: DC
  Administered 2016-05-20: 3 mL via INTRAVENOUS

## 2016-05-20 MED ORDER — CHLORHEXIDINE GLUCONATE 4 % EX LIQD
60.0000 mL | Freq: Once | CUTANEOUS | Status: AC
  Administered 2016-05-20: 4 via TOPICAL

## 2016-05-20 MED ORDER — PROSIGHT PO TABS
1.0000 | ORAL_TABLET | Freq: Two times a day (BID) | ORAL | Status: DC
  Administered 2016-05-20 – 2016-05-21 (×2): 1 via ORAL
  Filled 2016-05-20 (×2): qty 1

## 2016-05-20 MED ORDER — ONDANSETRON HCL 4 MG/2ML IJ SOLN
4.0000 mg | Freq: Four times a day (QID) | INTRAMUSCULAR | Status: DC | PRN
  Administered 2016-05-20: 4 mg via INTRAVENOUS
  Filled 2016-05-20: qty 2

## 2016-05-20 MED ORDER — CEFAZOLIN SODIUM-DEXTROSE 2-4 GM/100ML-% IV SOLN
INTRAVENOUS | Status: AC
  Filled 2016-05-20: qty 100

## 2016-05-20 MED ORDER — MIDAZOLAM HCL 5 MG/5ML IJ SOLN
INTRAMUSCULAR | Status: AC
  Filled 2016-05-20: qty 5

## 2016-05-20 MED ORDER — FENTANYL CITRATE (PF) 100 MCG/2ML IJ SOLN
INTRAMUSCULAR | Status: DC | PRN
  Administered 2016-05-20 (×2): 25 ug via INTRAVENOUS

## 2016-05-20 MED ORDER — LIDOCAINE HCL (PF) 1 % IJ SOLN
INTRAMUSCULAR | Status: AC
  Filled 2016-05-20: qty 30

## 2016-05-20 MED ORDER — CEFAZOLIN IN D5W 1 GM/50ML IV SOLN
1.0000 g | Freq: Four times a day (QID) | INTRAVENOUS | Status: AC
  Administered 2016-05-20 – 2016-05-21 (×3): 1 g via INTRAVENOUS
  Filled 2016-05-20 (×3): qty 50

## 2016-05-20 MED ORDER — HYDROCODONE-ACETAMINOPHEN 5-325 MG PO TABS
1.0000 | ORAL_TABLET | Freq: Once | ORAL | Status: AC
  Administered 2016-05-20: 1 via ORAL
  Filled 2016-05-20: qty 1

## 2016-05-20 MED ORDER — ACETAMINOPHEN 325 MG PO TABS: 325.0000 mg | ORAL_TABLET | ORAL | Status: DC | PRN

## 2016-05-20 MED ORDER — SODIUM CHLORIDE 0.9 % IV SOLN: INTRAVENOUS | Status: AC

## 2016-05-20 MED ORDER — HYDROMORPHONE HCL 1 MG/ML IJ SOLN
1.0000 mg | Freq: Once | INTRAMUSCULAR | Status: AC
  Administered 2016-05-20: 1 mg via INTRAVENOUS
  Filled 2016-05-20: qty 1

## 2016-05-20 MED ORDER — HYDROMORPHONE HCL 2 MG PO TABS: 1.0000 mg | ORAL_TABLET | Freq: Once | ORAL | Status: DC

## 2016-05-20 MED ORDER — SODIUM CHLORIDE 0.9 % IV SOLN
INTRAVENOUS | Status: DC
  Administered 2016-05-20: 13:00:00 via INTRAVENOUS

## 2016-05-20 MED ORDER — ACETAMINOPHEN 325 MG PO TABS
650.0000 mg | ORAL_TABLET | ORAL | Status: DC | PRN
  Administered 2016-05-20 – 2016-05-21 (×2): 650 mg via ORAL
  Filled 2016-05-20 (×2): qty 2

## 2016-05-20 MED ORDER — SODIUM CHLORIDE 0.9 % IV SOLN: 250.0000 mL | INTRAVENOUS | Status: DC | PRN

## 2016-05-20 MED ORDER — HEPARIN (PORCINE) IN NACL 2-0.9 UNIT/ML-% IJ SOLN
INTRAMUSCULAR | Status: AC
  Filled 2016-05-20: qty 500

## 2016-05-20 MED ORDER — CEFAZOLIN SODIUM-DEXTROSE 2-3 GM-% IV SOLR
INTRAVENOUS | Status: DC | PRN
  Administered 2016-05-20: 2 g via INTRAVENOUS

## 2016-05-20 MED ORDER — SODIUM CHLORIDE 0.9 % IV SOLN
250.0000 mL | INTRAVENOUS | Status: DC
  Administered 2016-05-20: 250 mL via INTRAVENOUS

## 2016-05-20 MED ORDER — LIDOCAINE HCL (PF) 1 % IJ SOLN
INTRAMUSCULAR | Status: DC | PRN
  Administered 2016-05-20: 80 mL via INTRADERMAL

## 2016-05-20 MED ORDER — SODIUM CHLORIDE 0.9 % IR SOLN
80.0000 mg | Status: AC
  Administered 2016-05-20: 80 mg

## 2016-05-20 MED ORDER — HEPARIN (PORCINE) IN NACL 2-0.9 UNIT/ML-% IJ SOLN
INTRAMUSCULAR | Status: DC | PRN
  Administered 2016-05-20: 1000 mL

## 2016-05-20 MED ORDER — YOU HAVE A PACEMAKER BOOK
Freq: Once | Status: DC
  Filled 2016-05-20: qty 1

## 2016-05-20 MED ORDER — FENTANYL CITRATE (PF) 100 MCG/2ML IJ SOLN
INTRAMUSCULAR | Status: AC
  Filled 2016-05-20: qty 2

## 2016-05-20 MED ORDER — MIDAZOLAM HCL 5 MG/5ML IJ SOLN
INTRAMUSCULAR | Status: DC | PRN
  Administered 2016-05-20 (×2): 1 mg via INTRAVENOUS

## 2016-05-20 MED ORDER — HEPARIN (PORCINE) IN NACL 2-0.9 UNIT/ML-% IJ SOLN
INTRAMUSCULAR | Status: AC
  Filled 2016-05-20: qty 1000

## 2016-05-20 SURGICAL SUPPLY — 7 items
CABLE SURGICAL S-101-97-12 (CABLE) ×2 IMPLANT
LEAD TENDRIL MRI 46CM LPA1200M (Lead) ×2 IMPLANT
LEAD TENDRIL MRI 52CM LPA1200M (Lead) ×2 IMPLANT
PACEMAKER ASSURITY DR-RF (Pacemaker) ×2 IMPLANT
PAD DEFIB LIFELINK (PAD) ×2 IMPLANT
SHEATH CLASSIC 8F (SHEATH) ×4 IMPLANT
TRAY PACEMAKER INSERTION (PACKS) ×2 IMPLANT

## 2016-05-20 NOTE — Progress Notes (Signed)
  Echocardiogram 2D Echocardiogram has been performed.  Donata Clay 05/20/2016, 8:58 AM

## 2016-05-20 NOTE — Consult Note (Addendum)
ELECTROPHYSIOLOGY CONSULT NOTE    Patient ID: Margaret Reese MRN: [id number], DOB/AGE: 1945-09-07 71 y.o.  Admit date: 05/19/2016 Date of Consult: 05/20/2016   Primary Physician: Wyatt Haste, MD Primary Cardiologist: Dr. Einar Gip Requesting MD: Dr. Doylene Canard  Reason for Consultation: syncope, heart block  HPI:  Margaret Reese is a 71 y.o. female who is being seen today for the evaluation of syncope and high degree AV block at the request of Dr. Doylene Canard.  PMHx includes HTN, asthma, gastric ulcers historically, GERD denies any known cardiac hsitory outside of her HTN.  She saw Dr. Vira Browns Dec 2016 for pre-op evaluation due to an abnormal EKG prior to her hip surgery which she had done without complications.  She was admitted to [facility] yesterday after a syncopal event at home, noted to be bradycardic with 2:1 heart block, she had c/o CP and was brought to the cath lab, found to have no CAD and temp pacing wire placed.    She tells [name] she has been under quite a bit of stress with her husband having a TKR about 4 weeks ago caring for hm and some out of town company at the house and feeling a bit overwhelmed of late.  She was in the kitchen and suddenly fainted prior to that had been feeling easily winded, and somewhat dizzy as well yesterday.  She denies CP this morning.  No hx of near syncope or syncope otherwise.  LABS K+ 3.9 > 4.2 BUN/Creat 21/1.19 >> 17/0.92 H/H 10.4/32.5 WBC 7.7 plts 178 TSH 1.360  Home meds reviewed, not on any rate limiting/nodal blocking agents  Past Medical History:  Diagnosis Date  . Allergy   . Arthritis   . Asthma   . Complication of anesthesia    pt states has difficulty awakening; also has increased sinus drainage  . Falls   . GERD (gastroesophageal reflux disease)   . H/O back injury   . History of bronchitis   . History of colon polyps   . Hypertension   . Imbalance   . Kidney cysts    pt states not sure which kidney does see kidney  specialist yearly pt states every thing okay currently   . Legally blind in right eye, as defined in [place]   . Multiple gastric ulcers   . Obesity   . Renal stone   . Retinal vein occlusion   . Tingling    left arm   . Trace cataracts   . Urinary frequency      Surgical History:  Past Surgical History:  Procedure Laterality Date  . CARDIOVASCULAR STRESS TEST  dec 2016  . CHOLECYSTECTOMY    . COLONOSCOPY  2011  . EYE SURGERY    . LEFT HEART CATH AND CORONARY ANGIOGRAPHY N/A 05/19/2016   Procedure: Left Heart Cath and Coronary Angiography;  Surgeon: Dixie Dials, MD;  Location: [place] [facility];  Service: Cardiovascular;  Laterality: N/A;  . TONSILLECTOMY    . TOTAL HIP ARTHROPLASTY Right 01/30/2015   Procedure: RIGHT TOTAL HIP ARTHROPLASTY ANTERIOR APPROACH AND REMOVAL LIPOMA RIGHT HIP;  Surgeon: Mcarthur Rossetti, MD;  Location: [facility];  Service: Orthopedics;  Laterality: Right;  . UPPER GI ENDOSCOPY       Prescriptions Prior to Admission  Medication Sig Dispense Refill Last Dose  . aspirin 81 MG tablet Take 81 mg by mouth daily.   05/18/2016 at Unknown time  . Cholecalciferol (VITAMIN D3) 1000 UNITS CAPS Take 1 capsule by mouth every  evening.    05/18/2016 at Unknown time  . fish oil-omega-3 fatty acids 1000 MG capsule Take 1 g by mouth daily.    05/19/2016 at Unknown time  . lisinopril-hydrochlorothiazide (PRINZIDE,ZESTORETIC) 10-12.5 MG tablet Take 1 tablet by mouth daily. 90 tablet 3 05/19/2016 at Unknown time  . Multiple Vitamins-Minerals (PRESERVISION AREDS 2) CAPS Take 1 capsule by mouth 2 (two) times daily.    05/19/2016 at Unknown time  . multivitamin (THERAGRAN) per tablet Take 1 tablet by mouth daily.     05/19/2016 at Unknown time  . potassium citrate (UROCIT-K) 5 MEQ (540 MG) SR tablet Take 1 tablet (5 mEq total) by mouth 2 (two) times daily. (Patient taking differently: Take 10 mEq by mouth 2 (two) times daily. ) 180 tablet 3 05/18/2016 at Unknown time  . simvastatin  (ZOCOR) 20 MG tablet Take 20 mg by mouth daily.   05/18/2016 at Unknown time  . [DISCONTINUED] Albuterol Sulfate (PROAIR RESPICLICK) 063 (90 BASE) MCG/ACT AEPB Inhale 2 application into the lungs 4 (four) times daily as needed. (Patient not taking: Reported on 05/19/2016) 1 each 0 Not Taking at Unknown time    Inpatient Medications:  . cholecalciferol  1,000 Units Oral QPM  . heparin  5,000 Units Subcutaneous Q8H  . hydrochlorothiazide  12.5 mg Oral Daily  . lisinopril  10 mg Oral Daily  . multivitamin  1 tablet Oral BID  . omega-3 acid ethyl esters  1 g Oral Daily  . simvastatin  20 mg Oral Daily  . sodium chloride flush  3 mL Intravenous Q12H    Allergies: No Known Allergies  Social History   Social History  . Marital status: Married    Spouse name: N/A  . Number of children: N/A  . Years of education: N/A   Occupational History  . Not on file.   Social History Main Topics  . Smoking status: Never Smoker  . Smokeless tobacco: Never Used  . Alcohol use No  . Drug use: No  . Sexual activity: Yes   Other Topics Concern  . Not on file   Social History Narrative  . No narrative on file     Family History  Problem Relation Age of Onset  . Depression Mother   . Asthma Brother      Review of Systems: All other systems reviewed and are otherwise negative except as noted above.  Physical Exam: Vitals:   05/20/16 0400 05/20/16 0500 05/20/16 0600 05/20/16 0700  BP: (!) 105/59 (!) 123/56  123/72  Pulse: 79 79 75 81  Resp: 16 13 20 20   Temp:      TempSrc:      SpO2: 98% 96% 98% 97%  Weight:  214 lb 15.2 oz (97.5 kg)    Height:  5\' 3"  (1.6 m)      GEN- The patient is well appearing, alert and oriented x 3 today.   HEENT: normocephalic, atraumatic; sclera clear, conjunctiva pink; hearing intact; oropharynx clear; neck supple, no JVP Lymph- no cervical lymphadenopathy Lungs- CTA b/l, normal work of breathing.  No wheezes, rales, rhonchi Heart- RRR, no murmurs, rubs  or gallops, PMI not laterally displaced GI- soft, non-tender, non-distended Extremities- no clubbing, cyanosis, or edema, R groin/temp wire site is stable MS- no significant deformity or atrophy Skin- warm and dry, no rash or lesion Psych- euthymic mood, full affect Neuro- no gross deficits observed  Labs:   Lab Results  Component Value Date   WBC 7.7 05/20/2016   HGB  10.4 (L) 05/20/2016   HCT 32.5 (L) 05/20/2016   MCV 86.9 05/20/2016   PLT 178 05/20/2016    Recent Labs Lab 05/20/16 0400  NA 141  K 4.2  CL 106  CO2 28  BUN 17  CREATININE 0.92  CALCIUM 8.9  GLUCOSE 99      Radiology/Studies:  Dg Chest Port 1v Same Day Result Date: 05/19/2016 CLINICAL DATA:  71 year old female with syncope yesterday. Cough today. Initial encounter. EXAM: PORTABLE CHEST 1 VIEW COMPARISON:  Chest radiographs 01/21/2013 FINDINGS: Portable AP semi upright view at 1447 hours. Mildly lordotic positioning. Dextroconvex thoracic scoliosis. Stable mild cardiomegaly. Other mediastinal contours are within normal limits. Calcified aortic atherosclerosis. Visualized tracheal air column is within normal limits. Increased pulmonary vascularity but no overt edema. No pneumothorax, pleural effusion or consolidation. IMPRESSION: 1. Pulmonary vascular congestion without overt edema or other acute cardiopulmonary abnormality. 2. Stable chronic cardiomegaly.  Calcified aortic atherosclerosis. Electronically Signed   By: Genevie Ann M.D.   On: 05/19/2016 15:08     Reviewed by myself and Dr. Curt Bears EKG:  #1 is 2:1 AVblock with V rate 43, RBBB #2 is SR/ V paced TELEMETRY: SR w/V pacing currently  01/23/15: lexiscan stress test Perfusion imaging is normal, LVEF 78%, no WMA, low risk study     Assessment and Plan:   1. Syncope     2:1 heart block     RBBB at baseline     s/p temp wire, cath with no CAD     Plan for PPM implant      Risk/benifits of the procedure were discussed with the patient and husband by  Dr. Curt Bears, she would like to proceed     Gerard Bonus get echo  2. HTN     Stable BP, no changes    Signed, Tommye Standard, PA-C 05/20/2016 7:33 AM   I have seen and examined this patient with Tommye Standard.  Agree with above, note added to reflect my findings.  On exam, regular rhythm, no murmurs, lungs clear. Presented to the hospital after an episode of syncope. She presented to her MD office and was found to be bradycardic, in 2:1 AV block. She is not on any rate controlling medications. A temporary pacing wire was placed. I discussed the option of pacemaker with her. Risks and benefits discussed. Risks include but not limited to bleeding, infection, tamponade, pneumothorax. She understands these risks and has agreed to the procedure. We Kiyah Demartini update her TTE today.    Charity Tessier M. Rashawn Rolon MD 05/20/2016 7:57 AM

## 2016-05-20 NOTE — Progress Notes (Signed)
Ref: Margaret Haste, MD   Subjective:  Feeling better except back pain from soft bed. Native heart rate in low 40's and functioning temporary pacemaker. Afebrile. Small hematoma right groin. Echocardiogram shows moderate LVH with good LV systolic function.  Objective:  Vital Signs in the last 24 hours: Temp:  [97.7 F (36.5 C)-98.8 F (37.1 C)] 98.2 F (36.8 C) (03/30 0700) Pulse Rate:  [0-170] 83 (03/30 1000) Cardiac Rhythm: Ventricular paced (03/30 0800) Resp:  [0-35] 16 (03/30 1000) BP: (93-178)/(45-109) 111/61 (03/30 1000) SpO2:  [0 %-100 %] 95 % (03/30 1000) Weight:  [97.5 kg (214 lb 15.2 oz)-97.5 kg (215 lb)] 97.5 kg (214 lb 15.2 oz) (03/30 0500)  Physical Exam: BP Readings from Last 1 Encounters:  05/20/16 111/61    Wt Readings from Last 1 Encounters:  05/20/16 97.5 kg (214 lb 15.2 oz)    Weight change:  Body mass index is 38.08 kg/m. HEENT: [place]/AT, Eyes-Blue, PERL, EOMI, Conjunctiva-Pink, Sclera-Non-icteric Neck: No JVD, No bruit, Trachea midline. Lungs:  Clear, Bilateral. Cardiac:  Regular rhythm, normal S1 and S2, no S3. II/VI systolic murmur. Abdomen:  Soft, non-tender. BS present. Extremities:  No edema present. No cyanosis. No clubbing. No significant hematoma at right groin with old blood on small dressing over venous catheter.  CNS: AxOx3, Cranial nerves grossly intact, moves all 4 extremities.  Skin: Warm and dry.   Intake/Output from previous day: 03/29 0701 - 03/30 0700 In: 1300 [I.V.:1300] Out: 51 [Urine:820]    Lab Results: BMET    Component Value Date/Time   NA 141 05/20/2016 0400   NA 141 05/19/2016 1242   NA 141 09/15/2015 0914   K 4.2 05/20/2016 0400   K 3.9 05/19/2016 1242   K 4.1 09/15/2015 0914   CL 106 05/20/2016 0400   CL 103 05/19/2016 1242   CL 101 09/15/2015 0914   CO2 28 05/20/2016 0400   CO2 30 05/19/2016 1242   CO2 31 09/15/2015 0914   GLUCOSE 99 05/20/2016 0400   GLUCOSE 90 05/19/2016 1242   GLUCOSE 89 09/15/2015  0914   BUN 17 05/20/2016 0400   BUN 21 (H) 05/19/2016 1242   BUN 22 09/15/2015 0914   CREATININE 0.92 05/20/2016 0400   CREATININE 1.05 (H) 05/19/2016 1500   CREATININE 1.19 (H) 05/19/2016 1242   CREATININE 0.84 09/15/2015 0914   CREATININE 0.80 09/04/2014 0001   CREATININE 0.79 09/02/2013 1024   CALCIUM 8.9 05/20/2016 0400   CALCIUM 10.1 05/19/2016 1242   CALCIUM 10.5 (H) 09/15/2015 0914   GFRNONAA >60 05/20/2016 0400   GFRNONAA 52 (L) 05/19/2016 1500   GFRNONAA 45 (L) 05/19/2016 1242   GFRAA >60 05/20/2016 0400   GFRAA >60 05/19/2016 1500   GFRAA 52 (L) 05/19/2016 1242   CBC    Component Value Date/Time   WBC 7.7 05/20/2016 0400   RBC 3.74 (L) 05/20/2016 0400   HGB 10.4 (L) 05/20/2016 0400   HCT 32.5 (L) 05/20/2016 0400   PLT 178 05/20/2016 0400   MCV 86.9 05/20/2016 0400   MCH 27.8 05/20/2016 0400   MCHC 32.0 05/20/2016 0400   RDW 13.6 05/20/2016 0400   LYMPHSABS 1,870 09/15/2015 0914   MONOABS 330 09/15/2015 0914   EOSABS 220 09/15/2015 0914   BASOSABS 55 09/15/2015 0914   HEPATIC Function Panel  Recent Labs  09/15/15 0914  PROT 7.2   HEMOGLOBIN A1C No components found for: HGA1C,  MPG CARDIAC ENZYMES No results found for: CKTOTAL, CKMB, CKMBINDEX, TROPONINI BNP No results for input(s):  PROBNP in the last 8760 hours. TSH  Recent Labs  05/19/16 1500  TSH 1.360   CHOLESTEROL  Recent Labs  09/15/15 0914  CHOL 153    Scheduled Meds: .  ceFAZolin (ANCEF) IV  2 g Intravenous To SS-Surg  . chlorhexidine  60 mL Topical Once  . cholecalciferol  1,000 Units Oral QPM  . gentamicin irrigation  80 mg Irrigation To Cath  . hydrochlorothiazide  12.5 mg Oral Daily  . lisinopril  10 mg Oral Daily  . multivitamin  1 tablet Oral BID  . omega-3 acid ethyl esters  1 g Oral Daily  . simvastatin  20 mg Oral Daily  . sodium chloride flush  3 mL Intravenous Q12H  . sodium chloride flush  3 mL Intravenous Q12H   Continuous Infusions: . sodium chloride 100  mL/hr at 05/20/16 0800  . sodium chloride    . sodium chloride     PRN Meds:.sodium chloride, acetaminophen, ondansetron (ZOFRAN) IV, oxyCODONE, sodium chloride flush, sodium chloride flush  Assessment/Plan: Syncope Symptomatic sinus bradycardia with 2:1 2nd degree AV block, LAHB and RBBB. Hypertension Obesity S/P right hip pain Back pain Shortness of breath on exertion Hypertrophic cardiomyopathy without obstruction  Awaiting permanent pacemaker insertion today. Dr. Terrence Dupont covering for weekend. Appreciate EP consult and patient to follow instructions by EP post surgery. Oxycodone or dilaudid as needed for back pain.    LOS: 1 day    Dixie Dials  MD  05/20/2016, 10:27 AM

## 2016-05-21 ENCOUNTER — Inpatient Hospital Stay (HOSPITAL_COMMUNITY): Payer: Medicare Other

## 2016-05-21 MED ORDER — OFF THE BEAT BOOK
Freq: Once | Status: AC
  Administered 2016-05-21: 12:00:00
  Filled 2016-05-21: qty 1

## 2016-05-21 MED ORDER — NITROGLYCERIN 0.4 MG SL SUBL: 0.4000 mg | SUBLINGUAL_TABLET | SUBLINGUAL | Status: DC | PRN

## 2016-05-21 MED ORDER — NITROGLYCERIN 0.4 MG SL SUBL: 0.4000 mg | SUBLINGUAL_TABLET | SUBLINGUAL | 2 refills | Status: DC | PRN

## 2016-05-21 MED ORDER — ACETAMINOPHEN 325 MG PO TABS: 325.0000 mg | ORAL_TABLET | ORAL | Status: DC | PRN

## 2016-05-21 NOTE — Progress Notes (Signed)
Patient Name: Margaret Reese      SUBJECTIVE: without chest pain  Breathing better with ambulation  Past Medical History:  Diagnosis Date  . Allergy   . Arthritis   . Asthma   . Complication of anesthesia    pt states has difficulty awakening; also has increased sinus drainage  . Falls   . GERD (gastroesophageal reflux disease)   . H/O back injury   . History of bronchitis   . History of colon polyps   . Hypertension   . Imbalance   . Kidney cysts    pt states not sure which kidney does see kidney specialist yearly pt states every thing okay currently   . Legally blind in right eye, as defined in [place]   . Multiple gastric ulcers   . Obesity   . Renal stone   . Retinal vein occlusion   . Tingling    left arm   . Trace cataracts   . Urinary frequency     Scheduled Meds:  Scheduled Meds: .  ceFAZolin (ANCEF) IV  1 g Intravenous Q6H  . cholecalciferol  1,000 Units Oral QPM  . hydrochlorothiazide  12.5 mg Oral Daily  . lisinopril  10 mg Oral Daily  . multivitamin  1 tablet Oral BID  . omega-3 acid ethyl esters  1 g Oral Daily  . simvastatin  20 mg Oral Daily  . sodium chloride flush  3 mL Intravenous Q12H  . you have a pacemaker book   Does not apply Once   Continuous Infusions: sodium chloride, acetaminophen, acetaminophen, nitroGLYCERIN, ondansetron (ZOFRAN) IV, ondansetron (ZOFRAN) IV, oxyCODONE, sodium chloride flush    PHYSICAL EXAM Vitals:   05/20/16 2315 05/21/16 0230 05/21/16 0645 05/21/16 0700  BP: (!) 96/32  (!) 122/48 (!) 122/49  Pulse: 68  72 69  Resp: 14  15 13   Temp:  97.8 F (36.6 C)  97.8 F (36.6 C)  TempSrc:  Oral  Oral  SpO2: 94%  94% 97%  Weight:      Height:        Well developed and nourished in no acute distress HENT normal Neck supple with JVP-flat Clear Pocket without  hematoma, swelling or tenderness  Regular rate and rhythm, no murmurs or gallops Abd-soft with active BS No Clubbing cyanosis  edema Skin-warm and dry A & Oriented  Grossly normal sensory and motor function   TELEMETRY: Reviewed personnally pt in P-synchronous/ AV  pacing :  ECG personally reviewed P-synchronous/ AV  pacing   Chest Xray personally reviewed  Good lead position   Intake/Output Summary (Last 24 hours) at 05/21/16 0919 Last data filed at 05/21/16 0900  Gross per 24 hour  Intake           654.22 ml  Output             1275 ml  Net          -620.78 ml    LABS: Basic Metabolic Panel:  Recent Labs Lab 05/19/16 1242 05/19/16 1500 05/20/16 0400  NA 141  --  141  K 3.9  --  4.2  CL 103  --  106  CO2 30  --  28  GLUCOSE 90  --  99  BUN 21*  --  17  CREATININE 1.19* 1.05* 0.92  CALCIUM 10.1  --  8.9   Cardiac Enzymes: No results for input(s): CKTOTAL, CKMB, CKMBINDEX, TROPONINI in the last 72 hours. CBC:  Recent Labs Lab 05/19/16 1242 05/19/16 1912 05/20/16 0400  WBC 7.0 6.4 7.7  HGB 11.9* 11.4* 10.4*  HCT 37.4 35.5* 32.5*  MCV 87.8 86.8 86.9  PLT 209 181 178   PROTIME:  Recent Labs  05/19/16 1500 05/20/16 0400  LABPROT 14.2 14.8  INR 1.09 1.16   Liver Function Tests: No results for input(s): AST, ALT, ALKPHOS, BILITOT, PROT, ALBUMIN in the last 72 hours. No results for input(s): LIPASE, AMYLASE in the last 72 hours. BNP: BNP (last 3 results) No results for input(s): BNP in the last 8760 hours.  ProBNP (last 3 results) No results for input(s): PROBNP in the last 8760 hours.  D-Dimer: No results for input(s): DDIMER in the last 72 hours. Hemoglobin A1C: No results for input(s): HGBA1C in the last 72 hours. Fasting Lipid Panel: No results for input(s): CHOL, HDL, LDLCALC, TRIG, CHOLHDL, LDLDIRECT in the last 72 hours. Thyroid Function Tests:  Recent Labs  05/19/16 1500  TSH 1.360   Anemia Panel: No results for input(s): VITAMINB12, FOLATE, FERRITIN, TIBC, IRON, RETICCTPCT in the last 72 hours.   Device Interrogation: normal device  function   ASSESSMENT AND PLAN:  Active Problems:   Syncope  ptwith 2:1 heart block assoc with syncope  Feeling better with pacing  Instructions given  Will need wound check in 10 days F/U WC 3 months    Signed, Virl Axe MD  05/21/2016

## 2016-05-21 NOTE — Discharge Summary (Signed)
Discharge summary dictated on 05/21/2016 dictation number is [phone]

## 2016-05-22 NOTE — Discharge Summary (Signed)
NAMEVERGIA, CHEA              ACCOUNT NO.:  [id number]  MEDICAL RECORD NO.:  [id number]  LOCATION:                                 FACILITY:  PHYSICIAN:  Itzayanna Kaster N. Terrence Dupont, M.D.      DATE OF BIRTH:  DATE OF ADMISSION:  05/19/2016 DATE OF DISCHARGE:  05/21/2016                              DISCHARGE SUMMARY   ADMITTING PHYSICIAN:  Dr. Doylene Canard.  ADMITTING DIAGNOSES: 1. Syncope. 2. Atypical chest pain. 3. Sinus bradycardia, second degree, type 2 atrioventricular block.     She also has left anterior fascicular hemiblock and right bundle-     branch block. 4. Hypertension. 5. Hyperlipidemia. 6. History of right hip surgery in the past. 7. Morbid obesity. 8. Status post symptomatic sinus bradycardia with second-degree AV     block, status post permanent pacemaker. 9. Status post syncope. 10.Status post atypical chest pain, myocardial infarction ruled out,     normal coronary arteries by cardiac catheterization done yesterday. 11.Hypertension. 12.Hyperlipidemia. 13.Morbid obesity. 14.Degenerative joint disease. 15.History of right hip surgery in the past. 16.History of retinal vein occlusion in the past.  BRIEF HISTORY AND HOSPITAL COURSE:  This is a 71 year old female with past medical history significant for hypertension, hyperlipidemia, morbid obesity, had a syncopal episode yesterday with significant bradycardia and also complains of exertional dyspnea.  The patient blames it on increased stress lately.  She was not on any AV blocking medications.  She __________  PAST MEDICAL HISTORY:  History of hypertension, hyperlipidemia, morbid obesity, history of retinal vein occlusion in the past, history of bronchial asthma, degenerative joint disease, history of colonic polyps in the past.  PAST SURGICAL HISTORY:  Cholecystectomy in the past, __________ in the past, total right hip arthroplasty in December 2016, also had nuclear stress test in December 2016.  PHYSICAL  EXAMINATION:  GENERAL:  She was alert, awake, oriented x3. VITAL SIGNS:  Blood pressure was 134/45, pulse was 41, she was afebrile. EYES:  Conjunctivae pink. NECK:  Supple.  No JVD.  No bruit. LUNGS:  Clear to auscultation bilaterally. CARDIOVASCULAR:  S1, S2 was normal.  There was 2/6 systolic murmur.  No click, rub, or gallop. ABDOMEN:  Soft.  Bowel sounds were present.  Obese, nontender. EXTREMITIES:  There was no clubbing, cyanosis, or edema. NEURO:  Grossly intact.  LABORATORY DATA:  Sodium 141, potassium 3.9, BUN 21, creatinine 1.19, glucose was 90, hemoglobin was 11.9, hematocrit 37.4, white count of 7.0.  TSH was 1.36.  Chest x-ray showed __________.  The patient had cardiac catheterization done yesterday, which showed normal coronaries, and subsequently had pacemaker placement.  Admission EKG showed sinus rhythm with second-degree AV block with right bundle-branch block and left anterior fascicular block.  BRIEF HOSPITAL COURSE:  The patient was admitted __________ followed by permanent pacemaker __________.  The patient tolerated the procedure well.  Postprocedure, the patient has not had any episodes of dizziness or bradycardia.  The patient states feeling better after pacemaker.  She is ambulating in the hallway without any problem in the pacemaker site. There is no evidence of hematoma or ecchymosis.  __________.  The patient will be discharged home, and will be followed by Dr.  Ganji in 1 week at __________ Clinic, and then be scheduled post pacemaker site instructions.  Instructions have been given.     Allegra Lai. Terrence Dupont, M.D.     MNH/MEDQ  D:  05/21/2016  T:  05/21/2016  Job:  [id number]

## 2016-05-23 ENCOUNTER — Encounter (HOSPITAL_COMMUNITY): Payer: Self-pay | Admitting: Cardiology

## 2016-05-31 DIAGNOSIS — I441 Atrioventricular block, second degree: Secondary | ICD-10-CM | POA: Diagnosis not present

## 2016-05-31 DIAGNOSIS — I5032 Chronic diastolic (congestive) heart failure: Secondary | ICD-10-CM | POA: Diagnosis not present

## 2016-05-31 DIAGNOSIS — R55 Syncope and collapse: Secondary | ICD-10-CM | POA: Diagnosis not present

## 2016-05-31 DIAGNOSIS — Z950 Presence of cardiac pacemaker: Secondary | ICD-10-CM | POA: Diagnosis not present

## 2016-06-02 ENCOUNTER — Ambulatory Visit (INDEPENDENT_AMBULATORY_CARE_PROVIDER_SITE_OTHER): Payer: Medicare Other | Admitting: *Deleted

## 2016-06-02 DIAGNOSIS — I442 Atrioventricular block, complete: Secondary | ICD-10-CM

## 2016-06-02 DIAGNOSIS — Z950 Presence of cardiac pacemaker: Secondary | ICD-10-CM | POA: Diagnosis not present

## 2016-06-02 LAB — CUP PACEART INCLINIC DEVICE CHECK
Implantable Lead Implant Date: 20180330
Implantable Lead Location: 753860
Lead Channel Impedance Value: 437.5 Ohm
Lead Channel Impedance Value: 487.5 Ohm
Lead Channel Pacing Threshold Amplitude: 0.5 V
Lead Channel Pacing Threshold Amplitude: 0.75 V
Lead Channel Pacing Threshold Amplitude: 0.75 V
Lead Channel Pacing Threshold Pulse Width: 0.5 ms
Lead Channel Sensing Intrinsic Amplitude: 12 mV
Lead Channel Sensing Intrinsic Amplitude: 5 mV
Lead Channel Setting Pacing Amplitude: 3.5 V
Lead Channel Setting Pacing Amplitude: 3.5 V
Lead Channel Setting Pacing Pulse Width: 0.5 ms
Lead Channel Setting Sensing Sensitivity: 4 mV
MDC IDC LEAD IMPLANT DT: 20180330
MDC IDC LEAD LOCATION: 753859
MDC IDC MSMT BATTERY VOLTAGE: 3.04 V
MDC IDC MSMT LEADCHNL RA PACING THRESHOLD AMPLITUDE: 0.5 V
MDC IDC MSMT LEADCHNL RA PACING THRESHOLD PULSEWIDTH: 0.5 ms
MDC IDC MSMT LEADCHNL RA PACING THRESHOLD PULSEWIDTH: 0.5 ms
MDC IDC MSMT LEADCHNL RV PACING THRESHOLD PULSEWIDTH: 0.5 ms
MDC IDC PG IMPLANT DT: 20180330
MDC IDC PG SERIAL: 7998534
MDC IDC SESS DTM: 20180412104337
MDC IDC STAT BRADY RA PERCENT PACED: 0.92 %
MDC IDC STAT BRADY RV PERCENT PACED: 99.95 %

## 2016-06-02 NOTE — Progress Notes (Signed)
Wound check appointment s/p PPM implant 05/20/16 by Dr. Curt Bears for [facility]. Steri-strips removed. Wound without redness or edema. Incision edges approximated, wound well healed. Normal device function. Thresholds, sensing, and impedances consistent with implant measurements. Device programmed at 3.5V for extra safety margin until 08/23/16 OV. Histogram distribution appropriate for patient and level of activity. No mode switches or high ventricular rates noted. Intact conduction today with PR ~ 257ms, VIP turned on nominal settings. Patient educated about wound care, arm mobility, lifting restrictions. ROV with WC 08/23/16 at 9:30am. Pt to then follow up with Dr. Einar Gip (plan to release pt in [place] at 7/3 OV).

## 2016-07-12 DIAGNOSIS — R55 Syncope and collapse: Secondary | ICD-10-CM | POA: Diagnosis not present

## 2016-07-12 DIAGNOSIS — Z950 Presence of cardiac pacemaker: Secondary | ICD-10-CM | POA: Diagnosis not present

## 2016-07-12 DIAGNOSIS — I10 Essential (primary) hypertension: Secondary | ICD-10-CM | POA: Diagnosis not present

## 2016-07-12 DIAGNOSIS — I441 Atrioventricular block, second degree: Secondary | ICD-10-CM | POA: Diagnosis not present

## 2016-07-21 DIAGNOSIS — H10413 Chronic giant papillary conjunctivitis, bilateral: Secondary | ICD-10-CM | POA: Diagnosis not present

## 2016-07-21 DIAGNOSIS — H353121 Nonexudative age-related macular degeneration, left eye, early dry stage: Secondary | ICD-10-CM | POA: Diagnosis not present

## 2016-07-21 DIAGNOSIS — H31091 Other chorioretinal scars, right eye: Secondary | ICD-10-CM | POA: Diagnosis not present

## 2016-07-21 DIAGNOSIS — Z961 Presence of intraocular lens: Secondary | ICD-10-CM | POA: Diagnosis not present

## 2016-07-21 DIAGNOSIS — H04123 Dry eye syndrome of bilateral lacrimal glands: Secondary | ICD-10-CM | POA: Diagnosis not present

## 2016-08-08 ENCOUNTER — Telehealth: Payer: Self-pay | Admitting: Family Medicine

## 2016-08-08 ENCOUNTER — Other Ambulatory Visit: Payer: Self-pay

## 2016-08-08 MED ORDER — LISINOPRIL-HYDROCHLOROTHIAZIDE 10-12.5 MG PO TABS: 1.0000 | ORAL_TABLET | Freq: Every day | ORAL | 0 refills | Status: DC

## 2016-08-08 NOTE — Telephone Encounter (Signed)
Med has been sent in  

## 2016-08-08 NOTE — Telephone Encounter (Signed)
Pt made [facility] appt for 11/08/16. Requesting refill on Lisinopril 10-12.5 mg to last until appt.

## 2016-08-16 ENCOUNTER — Encounter: Payer: Medicare Other | Admitting: Cardiology

## 2016-08-23 ENCOUNTER — Encounter: Payer: Medicare Other | Admitting: Cardiology

## 2016-08-29 DIAGNOSIS — I10 Essential (primary) hypertension: Secondary | ICD-10-CM | POA: Diagnosis not present

## 2016-08-29 DIAGNOSIS — Z1231 Encounter for screening mammogram for malignant neoplasm of breast: Secondary | ICD-10-CM | POA: Diagnosis not present

## 2016-08-29 DIAGNOSIS — I5032 Chronic diastolic (congestive) heart failure: Secondary | ICD-10-CM | POA: Diagnosis not present

## 2016-08-29 DIAGNOSIS — Z950 Presence of cardiac pacemaker: Secondary | ICD-10-CM | POA: Diagnosis not present

## 2016-08-29 LAB — HM MAMMOGRAPHY

## 2016-09-01 ENCOUNTER — Encounter: Payer: Self-pay | Admitting: Family Medicine

## 2016-09-12 DIAGNOSIS — M109 Gout, unspecified: Secondary | ICD-10-CM | POA: Diagnosis not present

## 2016-11-08 ENCOUNTER — Ambulatory Visit (INDEPENDENT_AMBULATORY_CARE_PROVIDER_SITE_OTHER): Payer: Medicare Other | Admitting: Family Medicine

## 2016-11-08 ENCOUNTER — Encounter: Payer: Self-pay | Admitting: Family Medicine

## 2016-11-08 VITALS — BP 126/70 | HR 82 | Resp 18 | Ht 61.0 in | Wt 217.2 lb

## 2016-11-08 DIAGNOSIS — Z950 Presence of cardiac pacemaker: Secondary | ICD-10-CM | POA: Diagnosis not present

## 2016-11-08 DIAGNOSIS — K219 Gastro-esophageal reflux disease without esophagitis: Secondary | ICD-10-CM | POA: Diagnosis not present

## 2016-11-08 DIAGNOSIS — M1990 Unspecified osteoarthritis, unspecified site: Secondary | ICD-10-CM | POA: Diagnosis not present

## 2016-11-08 DIAGNOSIS — I441 Atrioventricular block, second degree: Secondary | ICD-10-CM | POA: Insufficient documentation

## 2016-11-08 DIAGNOSIS — E785 Hyperlipidemia, unspecified: Secondary | ICD-10-CM

## 2016-11-08 DIAGNOSIS — I10 Essential (primary) hypertension: Secondary | ICD-10-CM | POA: Diagnosis not present

## 2016-11-08 DIAGNOSIS — N200 Calculus of kidney: Secondary | ICD-10-CM

## 2016-11-08 DIAGNOSIS — Z96641 Presence of right artificial hip joint: Secondary | ICD-10-CM | POA: Diagnosis not present

## 2016-11-08 HISTORY — DX: Atrioventricular block, second degree: I44.1

## 2016-11-08 LAB — CBC WITH DIFFERENTIAL/PLATELET
BASOS PCT: 0.9 %
Basophils Absolute: 60 cells/uL (ref 0–200)
Eosinophils Absolute: 281 cells/uL (ref 15–500)
Eosinophils Relative: 4.2 %
HEMATOCRIT: 38.9 % (ref 35.0–45.0)
HEMOGLOBIN: 12.7 g/dL (ref 11.7–15.5)
LYMPHS ABS: 2044 {cells}/uL (ref 850–3900)
MCH: 28.5 pg (ref 27.0–33.0)
MCHC: 32.6 g/dL (ref 32.0–36.0)
MCV: 87.2 fL (ref 80.0–100.0)
MPV: 10.6 fL (ref 7.5–12.5)
Monocytes Relative: 4.8 %
Neutro Abs: 3993 cells/uL (ref 1500–7800)
Neutrophils Relative %: 59.6 %
Platelets: 218 10*3/uL (ref 140–400)
RBC: 4.46 10*6/uL (ref 3.80–5.10)
RDW: 14 % (ref 11.0–15.0)
Total Lymphocyte: 30.5 %
WBC: 6.7 10*3/uL (ref 3.8–10.8)
WBCMIX: 322 {cells}/uL (ref 200–950)

## 2016-11-08 LAB — LIPID PANEL
CHOL/HDL RATIO: 2.6 (calc) (ref <5.0)
CHOLESTEROL: 153 mg/dL (ref <200)
HDL: 58 mg/dL (ref >50)
LDL Cholesterol (Calc): 75 mg/dL (calc)
Non-HDL Cholesterol (Calc): 95 mg/dL (calc) (ref <130)
Triglycerides: 112 mg/dL (ref <150)

## 2016-11-08 LAB — COMPREHENSIVE METABOLIC PANEL
AG Ratio: 1.8 (calc) (ref 1.0–2.5)
ALBUMIN MSPROF: 4.6 g/dL (ref 3.6–5.1)
ALT: 13 U/L (ref 6–29)
AST: 18 U/L (ref 10–35)
Alkaline phosphatase (APISO): 104 U/L (ref 33–130)
BILIRUBIN TOTAL: 0.9 mg/dL (ref 0.2–1.2)
BUN/Creatinine Ratio: 19 (calc) (ref 6–22)
BUN: 18 mg/dL (ref 7–25)
CALCIUM: 10.2 mg/dL (ref 8.6–10.4)
CHLORIDE: 100 mmol/L (ref 98–110)
CO2: 30 mmol/L (ref 20–32)
CREATININE: 0.96 mg/dL — AB (ref 0.60–0.93)
GLOBULIN: 2.6 g/dL (ref 1.9–3.7)
Glucose, Bld: 90 mg/dL (ref 65–99)
POTASSIUM: 3.8 mmol/L (ref 3.5–5.3)
Sodium: 141 mmol/L (ref 135–146)
Total Protein: 7.2 g/dL (ref 6.1–8.1)

## 2016-11-08 LAB — URIC ACID: URIC ACID, SERUM: 6.9 mg/dL (ref 2.5–7.0)

## 2016-11-08 MED ORDER — LISINOPRIL-HYDROCHLOROTHIAZIDE 10-12.5 MG PO TABS: 1.0000 | ORAL_TABLET | Freq: Every day | ORAL | 3 refills | Status: DC

## 2016-11-08 MED ORDER — SIMVASTATIN 20 MG PO TABS: 20.0000 mg | ORAL_TABLET | Freq: Every day | ORAL | 3 refills | Status: DC

## 2016-11-08 NOTE — Addendum Note (Signed)
Addended by: Denita Lung on: 11/08/2016 01:04 PM   Modules accepted: Level of Service

## 2016-11-08 NOTE — Progress Notes (Signed)
Margaret Reese is a 71 y.o. female who presents for annual wellness visit and follow-up on chronic medical conditions.  She has the following concerns: She does have a history of Mobitz type II and now has a pacemaker and is being followed by cardiology. She has had a hip replacement and seems to be doing well with that. Continues on her I's and a pro/HCTZ. She is also taking simvastatin for her lipids and having no aches or pains with that. She sees her urologist regularly for continued monitoring of a renal cyst as well as kidney stones. Her reflux is under good control. She has had a hip replacement and therefore have less difficulty with arthritis. She was seen recently in an urgent care center and apparently told she had gout. She is not sure what medicine was given and presently is having no difficulty. She is concerned over the fact that she has limitations on food she can eat now because of gout and because of the stones.  Immunizations and Health Maintenance Immunization History  Administered Date(s) Administered  . Influenza Split 11/07/2011, 12/01/2012  . Influenza Whole 01/11/2006, 11/28/2008, 11/30/2010  . Influenza-Unspecified 12/08/2014, 12/07/2015  . Pneumococcal Conjugate-13 09/15/2015  . Pneumococcal Polysaccharide-23 09/07/2004, 09/11/2012  . Td 09/17/2004  . Tdap 06/01/2015  . Zoster 12/16/2009   Health Maintenance Due  Topic Date Due  . INFLUENZA VACCINE  09/21/2016    Last Pap smear: no  Last mammogram: 08/2016 Last colonoscopy: 09/2014 Last DEXA: 02/2014 Dentist:  Dr. Elbert Ewings Ophtho: Dr. Katy Fitch  Exercise: see exercise hx   Other doctors caring for patient include: Dr. Rosana Hoes- kidney- [facility], cardiology Dr. Feliberto Harts.   Advanced directives:  No Information given.    Depression screen:  See questionnaire below.  Depression screen [facility] 2/9 11/08/2016 09/15/2015 09/04/2014 09/11/2012  Decreased Interest 0 0 0 0  Down, Depressed, Hopeless 0 0 0 0  PHQ - 2 Score 0 0 0  0    Fall Risk Screen: see questionnaire below. Fall Risk  11/08/2016 09/15/2015 09/04/2014 09/11/2012  Falls in the past year? Yes Yes Yes No  Comment passed out- had pacemaker implanted - - -  Number falls in past yr: 1 1 1  -  Injury with Fall? Yes Yes No -  Risk for fall due to : - Other (Comment) Impaired balance/gait -  Risk for fall due to: Comment - food poision fainted  - -  Follow up - Falls evaluation completed - -    ADL screen:  See questionnaire below Functional Status Survey:      Review of Systems Constitutional: -, -unexpected weight change, -anorexia, -fatigue  Dermatology: denies changing moles, rash, lumps ENT: -runny nose, -ear pain, -sore throat,  Cardiology:  -chest pain, -palpitations, -orthopnea, Respiratory: -cough, -shortness of breath, -dyspnea on exertion, -wheezing,  Gastroenterology: -abdominal pain, -nausea, -vomiting, -diarrhea, -constipation, -dysphagia Musculoskeletal: -arthralgias, -myalgias, -joint swelling, -back pain, -  Urology: -dysuria, -difficulty urinating,  -urinary frequency, -urgency, incontinence     PHYSICAL EXAM:   General Appearance: Alert, cooperative, no distress, appears stated age Head: Normocephalic, without obvious abnormality, atraumatic Eyes: PERRL, conjunctiva/corneas clear, EOM's intact, fundi benign Ears: Normal TM's and external ear canals Nose: Nares normal, mucosa normal, no drainage or sinus tenderness Throat: Lips, mucosa, and tongue normal; teeth and gums normal Neck: Supple, no lymphadenopathy;  thyroid:  no enlargement/tenderness/nodules; no carotid bruit or JVD Lungs: Clear to auscultation bilaterally without wheezes, rales or ronchi; respirations unlabored Heart: Regular rate and rhythm, S1 and  S2 normal, no murmur, rubor gallop Abdomen: Soft, non-tender, nondistended, normoactive bowel sounds,  no masses, no hepatosplenomegaly Extremities: No clubbing, cyanosis or edema Pulses: 2+ and symmetric all  extremities Skin:  Skin color, texture, turgor normal, no rashes or lesions Lymph nodes: Cervical, supraclavicular, and axillary nodes normal Neurologic:  CNII-XII intact, normal strength, sensation and gait; reflexes 2+ and symmetric throughout Psych: Normal mood, affect, hygiene and grooming.  ASSESSMENT/PLAN: Obesity, Class III, BMI 40-49.9 (morbid obesity) ([place]) - Plan: CBC with Differential/Platelet, Comprehensive metabolic panel, Lipid panel, Amb ref to Medical Nutrition Therapy-MNT  Status post total replacement of right hip  Essential hypertension - Plan: CBC with Differential/Platelet, Comprehensive metabolic panel  Pacemaker  Mobitz type II atrioventricular block - Plan: CBC with Differential/Platelet, Comprehensive metabolic panel, Lipid panel  Calcium oxalate renal stones - Plan: Amb ref to Medical Nutrition Therapy-MNT  Hyperlipidemia, unspecified hyperlipidemia type - Plan: Lipid panel  Gastroesophageal reflux disease without esophagitis  Arthritis - Plan: Uric Acid  Discussed her medication management. Encouraged her to continue to remain physically active. I will refer her to nutritionist to help with dietary modification to help with both weight, renal stones and possible gout. She will continue on her other medications. She will continue to be followed by urology as well as cardiology. Information given concerning getting Shingrix as well as a flu shot.    Medicare Attestation I have personally reviewed: The patient's medical and social history Their use of alcohol, tobacco or illicit drugs Their current medications and supplements The patient's functional ability including ADLs,fall risks, home safety risks, cognitive, and hearing and visual impairment Diet and physical activities Evidence for depression or mood disorders  The patient's weight, height, and BMI have been recorded in the chart.  I have made referrals, counseling, and provided education to the  patient based on review of the above and I have provided the patient with a written personalized care plan for preventive services.     Wyatt Haste, MD   11/08/2016

## 2016-11-08 NOTE — Patient Instructions (Signed)
Current Outpatient Prescriptions on File Prior to Visit  Medication Sig Dispense Refill  . aspirin 81 MG tablet Take 81 mg by mouth daily.    . Cholecalciferol (VITAMIN D3) 1000 UNITS CAPS Take 1 capsule by mouth every evening.     . fish oil-omega-3 fatty acids 1000 MG capsule Take 1 g by mouth daily.     . Multiple Vitamins-Minerals (PRESERVISION AREDS 2) CAPS Take 1 capsule by mouth 2 (two) times daily.     . multivitamin (THERAGRAN) per tablet Take 1 tablet by mouth daily.      . potassium citrate (UROCIT-K) 5 MEQ (540 MG) SR tablet Take 1 tablet (5 mEq total) by mouth 2 (two) times daily. (Patient taking differently: Take 10 mEq by mouth 2 (two) times daily. ) 180 tablet 3  . acetaminophen (TYLENOL) 325 MG tablet Take 1-2 tablets (325-650 mg total) by mouth every 4 (four) hours as needed for mild pain. (Patient not taking: Reported on 11/08/2016)     No current facility-administered medications on file prior to visit.

## 2016-11-26 DIAGNOSIS — Z23 Encounter for immunization: Secondary | ICD-10-CM | POA: Diagnosis not present

## 2016-12-06 IMAGING — CR DG HIP (WITH OR WITHOUT PELVIS) 1V PORT*R*
1 series · 2 of 2 positions shown · non-contrast
Comparison: 01/30/2015.

CLINICAL DATA: 69-year-old female status post right hip
replacement.

EXAM:
DG HIP (WITH OR WITHOUT PELVIS) 1V PORT RIGHT

[Series 1: AP · U · 2 of 2 slices shown]
[im 1/2]
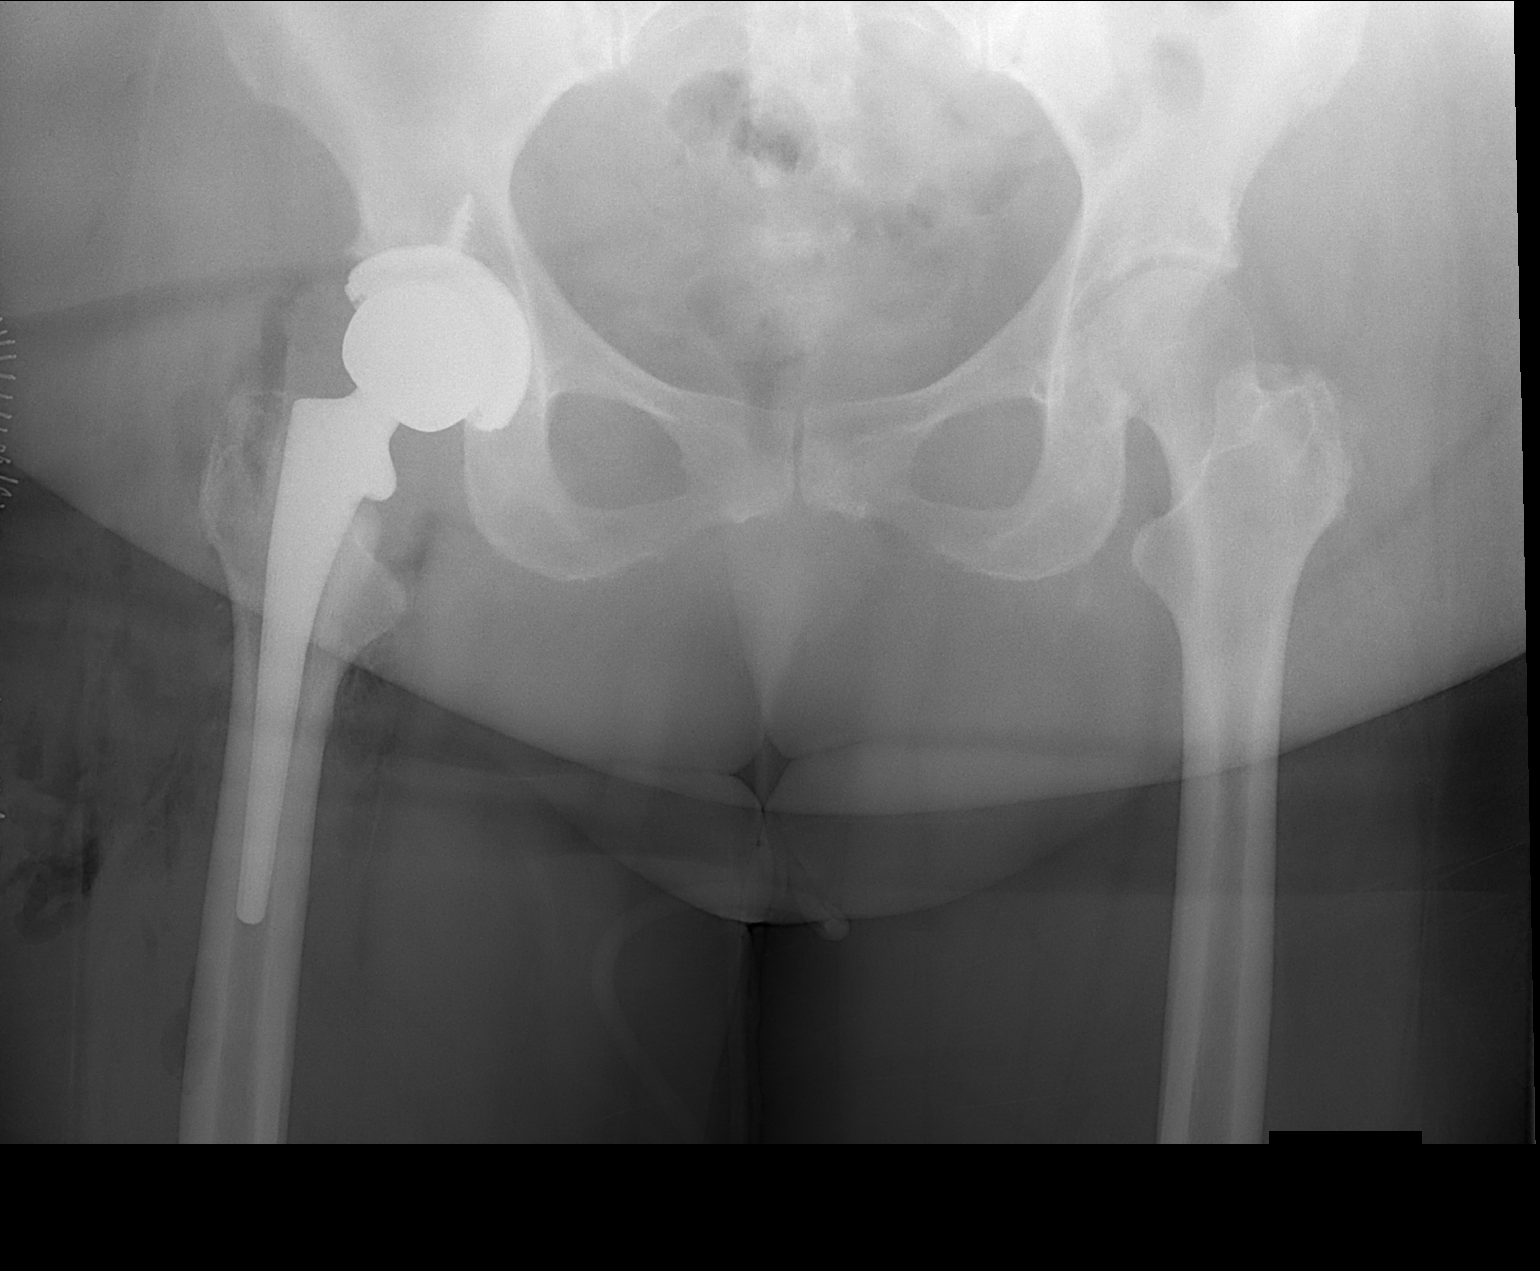
[im 2/2]
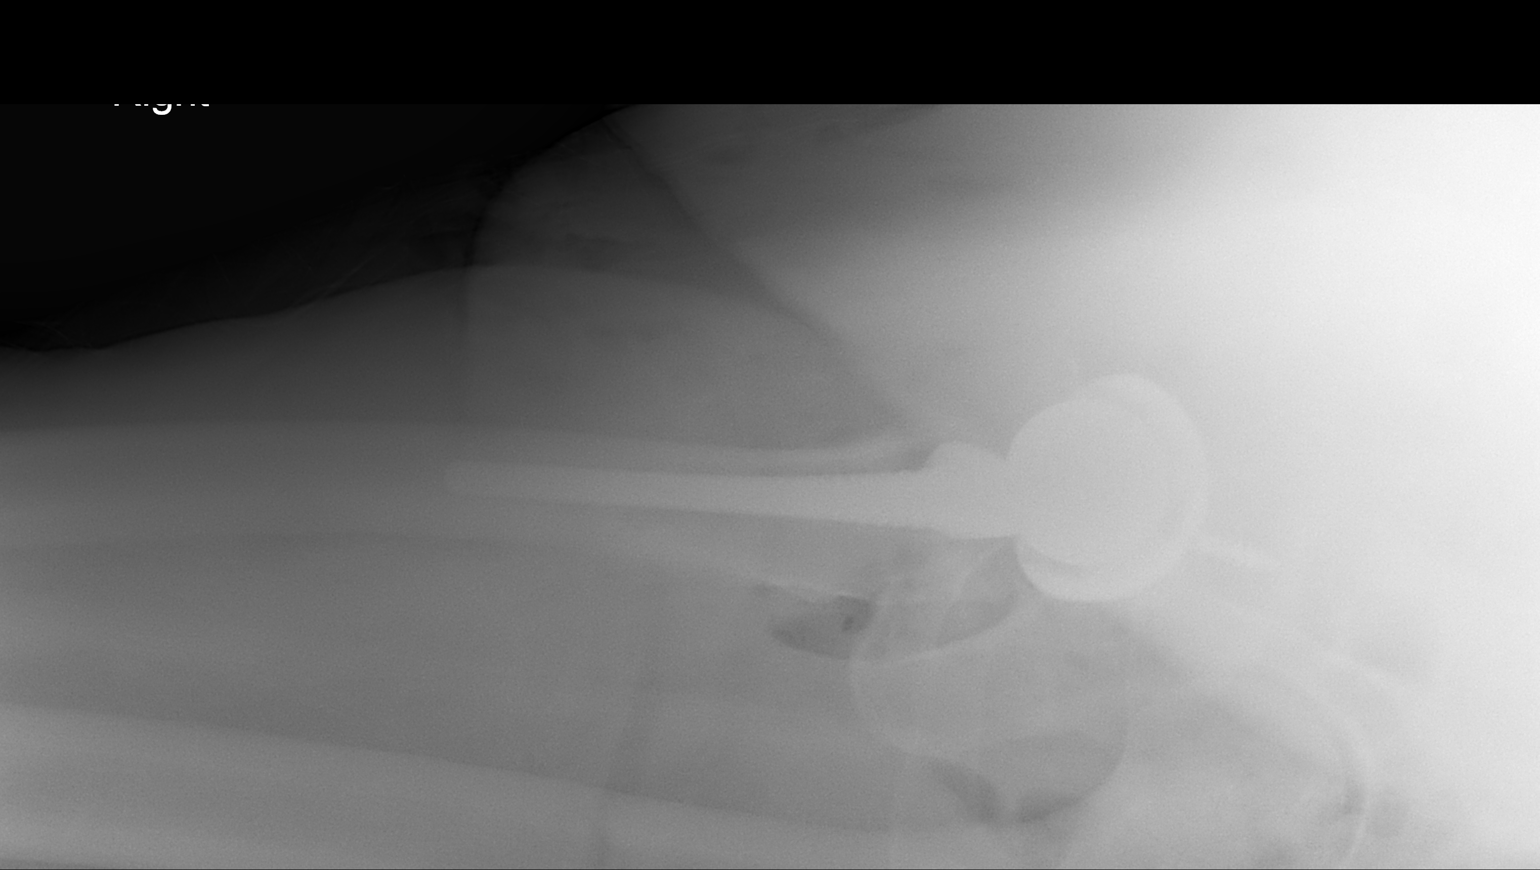

[2 of 2 positions shown; findings below may reference images not displayed]

FINDINGS: Postoperative changes of right total hip arthroplasty are noted.
Both the femoral and acetabular components of the prosthesis appear
properly seated without periprosthetic fracture or other immediate
complicating features. The prosthetic femoral head is properly
located within the prosthetic acetabulum. Small amount of gas in the
right hip joint space and in the overlying soft tissues. Skin
staples lateral to the right hip joint.
IMPRESSION: 1. Expected postoperative appearance following right total hip
arthroplasty, as above, without acute complicating features.

## 2016-12-06 IMAGING — RF DG HIP (WITH OR WITHOUT PELVIS) 1V*R*
1 series · 3 of 3 positions shown · non-contrast
Comparison: Radiography 09/04/2014

CLINICAL DATA: Right hip replacement

EXAM:
DG HIP (WITH OR WITHOUT PELVIS) 1V RIGHT

[Series 1: run · 3 of 3 slices shown]
[im 1/3]
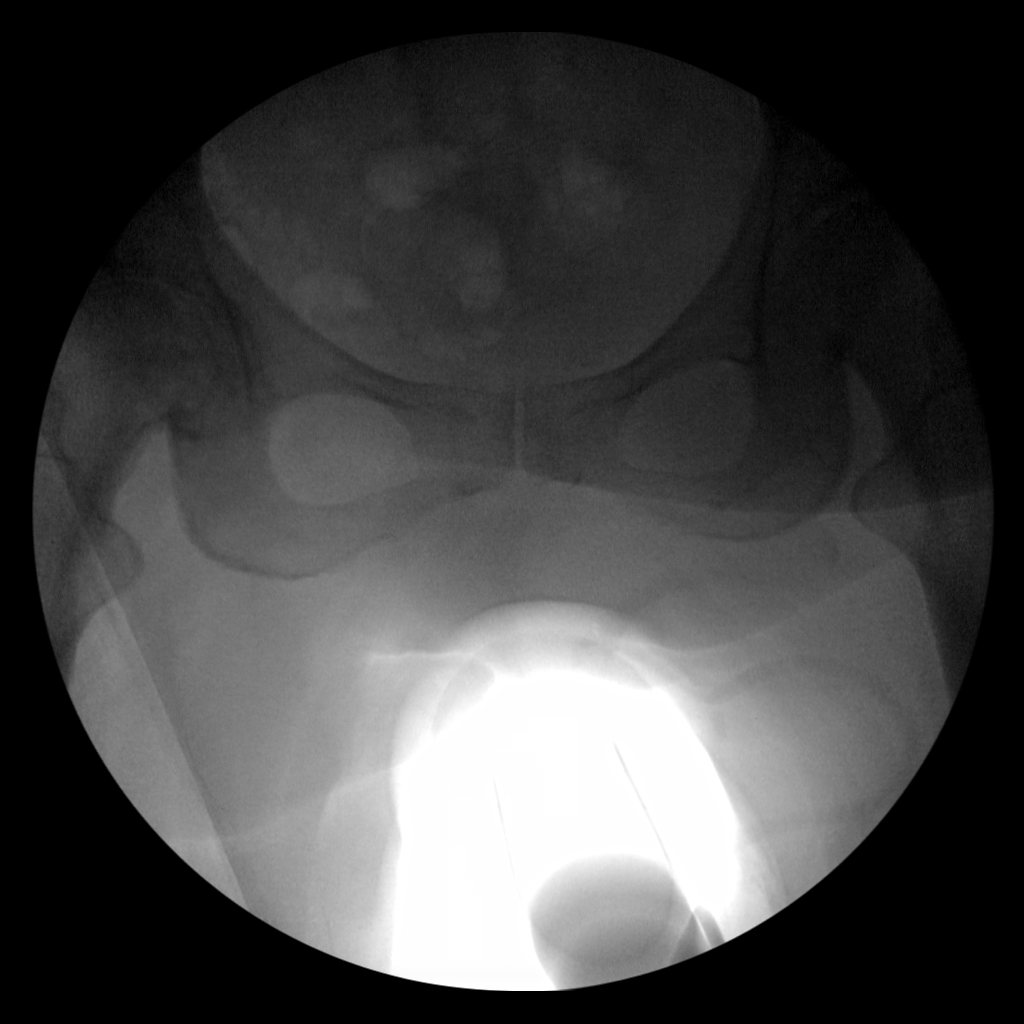
[im 2/3]
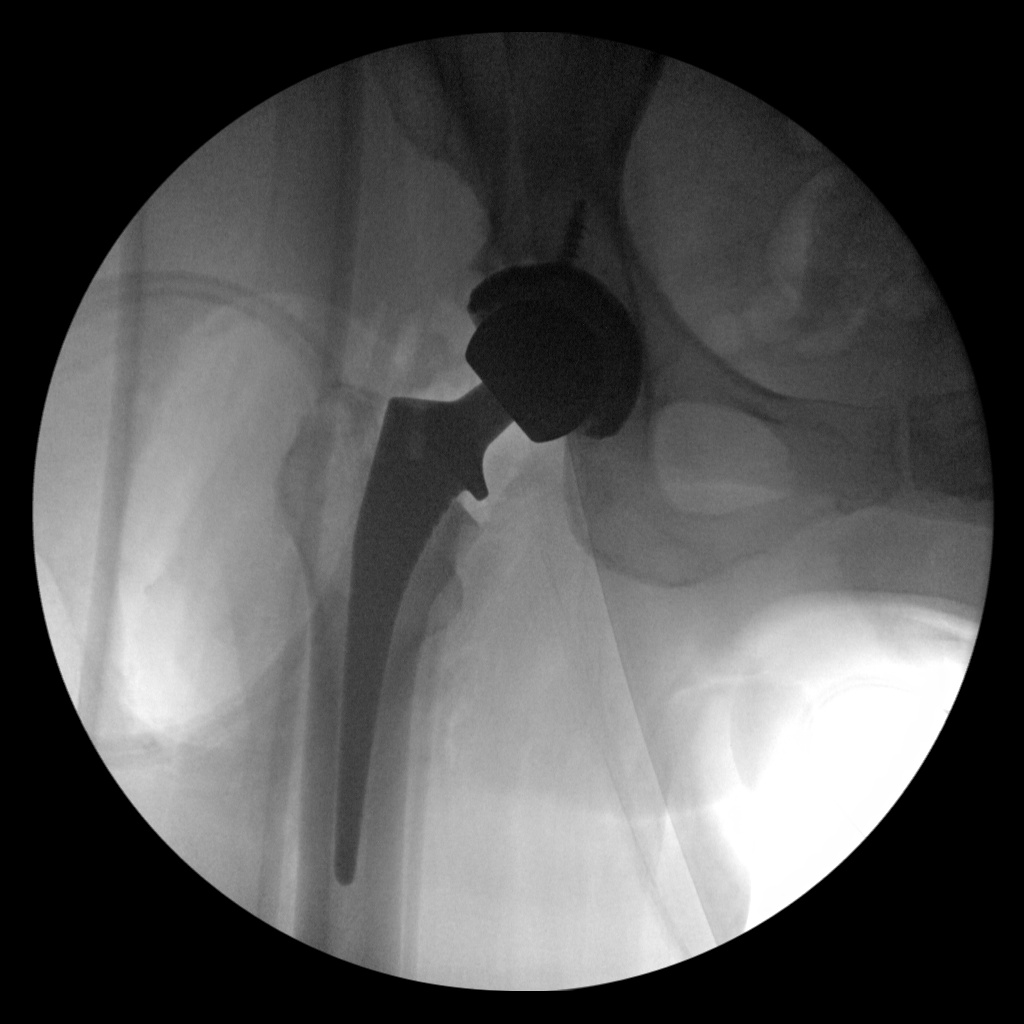
[im 3/3]
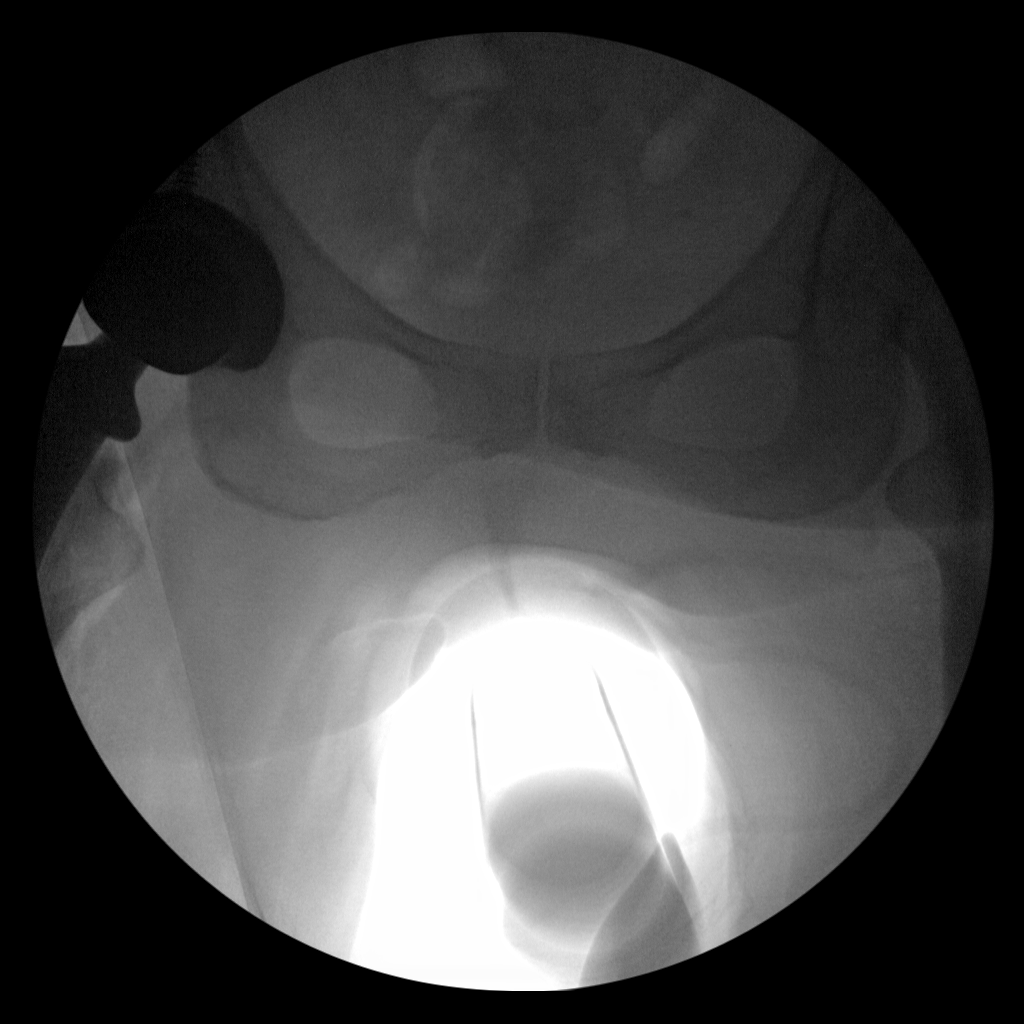

[3 of 3 positions shown; findings below may reference images not displayed]

FINDINGS: AP fluoroscopy shows placement of a total hip arthroplasty without
evidence of periprosthetic fracture or dislocation.
IMPRESSION: Fluoroscopy for total right hip arthroplasty.  No acute finding.

## 2017-01-02 DIAGNOSIS — N200 Calculus of kidney: Secondary | ICD-10-CM | POA: Diagnosis not present

## 2017-01-02 DIAGNOSIS — N281 Cyst of kidney, acquired: Secondary | ICD-10-CM | POA: Diagnosis not present

## 2017-03-14 DIAGNOSIS — Z950 Presence of cardiac pacemaker: Secondary | ICD-10-CM | POA: Diagnosis not present

## 2017-03-14 DIAGNOSIS — Z45018 Encounter for adjustment and management of other part of cardiac pacemaker: Secondary | ICD-10-CM | POA: Diagnosis not present

## 2017-03-14 DIAGNOSIS — I441 Atrioventricular block, second degree: Secondary | ICD-10-CM | POA: Diagnosis not present

## 2017-04-18 DIAGNOSIS — J209 Acute bronchitis, unspecified: Secondary | ICD-10-CM | POA: Diagnosis not present

## 2017-04-23 DIAGNOSIS — J209 Acute bronchitis, unspecified: Secondary | ICD-10-CM | POA: Diagnosis not present

## 2017-04-27 DIAGNOSIS — J209 Acute bronchitis, unspecified: Secondary | ICD-10-CM | POA: Diagnosis not present

## 2017-05-26 ENCOUNTER — Telehealth: Payer: Self-pay | Admitting: Family Medicine

## 2017-05-26 ENCOUNTER — Other Ambulatory Visit: Payer: Self-pay

## 2017-05-26 DIAGNOSIS — I10 Essential (primary) hypertension: Secondary | ICD-10-CM

## 2017-05-26 MED ORDER — LISINOPRIL-HYDROCHLOROTHIAZIDE 10-12.5 MG PO TABS: 1.0000 | ORAL_TABLET | Freq: Every day | ORAL | 3 refills | Status: DC

## 2017-05-26 NOTE — Telephone Encounter (Signed)
Pt requesting to send current Lisinopril 10-12.5 MG script to [facility] at [facility] [place], [place] so that she can pick up a refill. Pharmacy change is required by insurance.

## 2017-05-26 NOTE — Telephone Encounter (Signed)
Pt called no answer LVM. Kh

## 2017-08-19 DIAGNOSIS — Z950 Presence of cardiac pacemaker: Secondary | ICD-10-CM | POA: Diagnosis not present

## 2017-08-19 DIAGNOSIS — Z45018 Encounter for adjustment and management of other part of cardiac pacemaker: Secondary | ICD-10-CM | POA: Diagnosis not present

## 2017-08-30 DIAGNOSIS — Z1231 Encounter for screening mammogram for malignant neoplasm of breast: Secondary | ICD-10-CM | POA: Diagnosis not present

## 2017-08-30 DIAGNOSIS — Z45018 Encounter for adjustment and management of other part of cardiac pacemaker: Secondary | ICD-10-CM | POA: Diagnosis not present

## 2017-08-30 DIAGNOSIS — I10 Essential (primary) hypertension: Secondary | ICD-10-CM | POA: Diagnosis not present

## 2017-08-30 DIAGNOSIS — Z09 Encounter for follow-up examination after completed treatment for conditions other than malignant neoplasm: Secondary | ICD-10-CM | POA: Diagnosis not present

## 2017-08-30 DIAGNOSIS — I5032 Chronic diastolic (congestive) heart failure: Secondary | ICD-10-CM | POA: Diagnosis not present

## 2017-08-30 DIAGNOSIS — Z950 Presence of cardiac pacemaker: Secondary | ICD-10-CM | POA: Diagnosis not present

## 2017-08-30 DIAGNOSIS — N6002 Solitary cyst of left breast: Secondary | ICD-10-CM | POA: Diagnosis not present

## 2017-08-30 LAB — HM MAMMOGRAPHY

## 2017-08-31 ENCOUNTER — Encounter: Payer: Self-pay | Admitting: Family Medicine

## 2017-09-05 ENCOUNTER — Encounter: Payer: Self-pay | Admitting: Family Medicine

## 2017-09-05 DIAGNOSIS — Z1231 Encounter for screening mammogram for malignant neoplasm of breast: Secondary | ICD-10-CM | POA: Diagnosis not present

## 2017-09-05 DIAGNOSIS — N6002 Solitary cyst of left breast: Secondary | ICD-10-CM | POA: Diagnosis not present

## 2017-09-05 LAB — HM MAMMOGRAPHY

## 2017-11-09 ENCOUNTER — Ambulatory Visit (INDEPENDENT_AMBULATORY_CARE_PROVIDER_SITE_OTHER): Payer: Medicare Other | Admitting: Family Medicine

## 2017-11-09 ENCOUNTER — Encounter: Payer: Self-pay | Admitting: Family Medicine

## 2017-11-09 VITALS — BP 130/82 | HR 91 | Temp 97.7°F | Ht 61.0 in | Wt 213.4 lb

## 2017-11-09 DIAGNOSIS — I10 Essential (primary) hypertension: Secondary | ICD-10-CM | POA: Diagnosis not present

## 2017-11-09 DIAGNOSIS — M1990 Unspecified osteoarthritis, unspecified site: Secondary | ICD-10-CM

## 2017-11-09 DIAGNOSIS — Z950 Presence of cardiac pacemaker: Secondary | ICD-10-CM | POA: Diagnosis not present

## 2017-11-09 DIAGNOSIS — K219 Gastro-esophageal reflux disease without esophagitis: Secondary | ICD-10-CM

## 2017-11-09 DIAGNOSIS — J4520 Mild intermittent asthma, uncomplicated: Secondary | ICD-10-CM | POA: Diagnosis not present

## 2017-11-09 DIAGNOSIS — N200 Calculus of kidney: Secondary | ICD-10-CM

## 2017-11-09 DIAGNOSIS — Z96641 Presence of right artificial hip joint: Secondary | ICD-10-CM

## 2017-11-09 DIAGNOSIS — J301 Allergic rhinitis due to pollen: Secondary | ICD-10-CM | POA: Diagnosis not present

## 2017-11-09 DIAGNOSIS — E785 Hyperlipidemia, unspecified: Secondary | ICD-10-CM

## 2017-11-09 DIAGNOSIS — E66813 Obesity, class 3: Secondary | ICD-10-CM

## 2017-11-09 MED ORDER — SIMVASTATIN 20 MG PO TABS: 20.0000 mg | ORAL_TABLET | Freq: Every day | ORAL | 3 refills | Status: DC

## 2017-11-09 MED ORDER — LISINOPRIL-HYDROCHLOROTHIAZIDE 10-12.5 MG PO TABS: 1.0000 | ORAL_TABLET | Freq: Every day | ORAL | 3 refills | Status: DC

## 2017-11-09 NOTE — Progress Notes (Signed)
Margaret Reese is a 72 y.o. female who presents for annual wellness visit and follow-up on chronic medical conditions.  She does have a history of hypertension and continues on lisinopril/HCTZ.  She also is taking Zocor for her cholesterol and having no difficulty with that.  She also uses omega-3.  She does have a history of calcium oxalate stones and is on medication for that.  Her reflux is causing very little difficulty.  She does see cardiology regularly and does have a pacemaker.  Does complain of arthritic pain but does not keep her from exercising regularly.  Her allergies are under good control.  She does occasionally have asthma and uses pro-air.  She has had a hip replacement and is having no difficulty with that.  He does not smoke and as stated above does exercise fairly regularly.  She and her husband are doing quite nicely and enjoying their retirement.  Immunizations and Health Maintenance Immunization History  Administered Date(s) Administered  . Influenza Split 11/07/2011, 12/01/2012  . Influenza Whole 01/11/2006, 11/28/2008, 11/30/2010  . Influenza-Unspecified 12/08/2014, 12/07/2015, 11/21/2016  . Pneumococcal Conjugate-13 09/15/2015  . Pneumococcal Polysaccharide-23 09/07/2004, 09/11/2012  . Td 09/17/2004  . Tdap 06/01/2015  . Zoster 12/16/2009   Health Maintenance Due  Topic Date Due  . INFLUENZA VACCINE  09/21/2017    Last Pap smear: over five years Last mammogram: 08-30-17, 09-05-17 Last colonoscopy: over five years  Last DEXA: over five years Dentist: 7 months ago Ophtho:q two years Exercise: 45 min tiw in class, biking 1 hour qd  Other doctors caring for patient include: Benson [place].Ganji.Davis  Advanced directives:no, copy asked for    Depression screen:  See questionnaire below.  Depression screen [facility] 2/9 11/09/2017 11/08/2016 09/15/2015 09/04/2014 09/11/2012  Decreased Interest 0 0 0 0 0  Down, Depressed, Hopeless 0 0 0 0 0  PHQ - 2 Score 0 0 0 0 0    Fall Risk  Screen: see questionnaire below. Fall Risk  11/09/2017 11/08/2016 09/15/2015 09/04/2014 09/11/2012  Falls in the past year? No Yes Yes Yes No  Comment - passed out- had pacemaker implanted - - -  Number falls in past yr: - 1 1 1  -  Injury with Fall? - Yes Yes No -  Risk for fall due to : - - Other (Comment) Impaired balance/gait -  Risk for fall due to: Comment - - food poision fainted  - -  Follow up - - Falls evaluation completed - -    ADL screen:  See questionnaire below Functional Status Survey: Is the patient deaf or have difficulty hearing?: No Does the patient have difficulty seeing, even when wearing glasses/contacts?: No Does the patient have difficulty concentrating, remembering, or making decisions?: No Does the patient have difficulty walking or climbing stairs?: No Does the patient have difficulty dressing or bathing?: No Does the patient have difficulty doing errands alone such as visiting a doctor's office or shopping?: No   Review of Systems Constitutional: -, -unexpected weight change, -anorexia, -fatigue Allergy: -sneezing, -itching, -congestion Dermatology: denies changing moles, rash, lumps ENT: -runny nose, -ear pain, -sore throat,  Cardiology:  -chest pain, -palpitations, -orthopnea, Respiratory: -cough, -shortness of breath, -dyspnea on exertion, -wheezing,  Gastroenterology: -abdominal pain, -nausea, -vomiting, -diarrhea, -constipation, -dysphagia Hematology: -bleeding or bruising problems Musculoskeletal: -arthralgias, -myalgias, -joint swelling, -back pain, - Ophthalmology: -vision changes,  Urology: -dysuria, -difficulty urinating,  -urinary frequency, -urgency, incontinence Neurology: -, -numbness, , -memory loss, -falls, -dizziness    PHYSICAL EXAM:  General Appearance:  Alert, cooperative, no distress, appears stated age Head: Normocephalic, without obvious abnormality, atraumatic Eyes: PERRL, conjunctiva/corneas clear, EOM's intact, fundi  benign Ears: Normal TM's and external ear canals Nose: Nares normal, mucosa normal, no drainage or sinus tenderness Throat: Lips, mucosa, and tongue normal; teeth and gums normal Neck: Supple, no lymphadenopathy;  thyroid:  no enlargement/tenderness/nodules; no carotid bruit or JVD Lungs: Clear to auscultation bilaterally without wheezes, rales or ronchi; respirations unlabored Heart: Regular rate and rhythm, S1 and S2 normal, no murmur, rubor gallop Abdomen: Soft, non-tender, nondistended, normoactive bowel sounds,  no masses, no hepatosplenomegaly Extremities: No clubbing, cyanosis or edema Pulses: 2+ and symmetric all extremities Skin:  Skin color, texture, turgor normal, no rashes or lesions Lymph nodes: Cervical, supraclavicular, and axillary nodes normal Neurologic:  CNII-XII intact, normal strength, sensation and gait; reflexes 2+ and symmetric throughout Psych: Normal mood, affect, hygiene and grooming.  ASSESSMENT/PLAN: Obesity, Class III, BMI 40-49.9 (morbid obesity) ([place]) - Plan: CBC with Differential/Platelet, Comprehensive metabolic panel, Lipid panel  Essential hypertension - Plan: CBC with Differential/Platelet, Comprehensive metabolic panel  Calcium oxalate renal stones - Plan: CBC with Differential/Platelet, Comprehensive metabolic panel  Hyperlipidemia, unspecified hyperlipidemia type - Plan: Lipid panel  Gastroesophageal reflux disease without esophagitis  Pacemaker - Plan: CBC with Differential/Platelet, Comprehensive metabolic panel, Lipid panel  Arthritis  Seasonal allergic rhinitis due to pollen  Mild intermittent asthma without complication  Status post total replacement of right hip She will continue on her present medication regimen.  Discussed diet and exercise with her in regard to cutting back on carbohydrates.  Her medications will be renewed.    Medicare Attestation I have personally reviewed: The patient's medical and social history Their use  of alcohol, tobacco or illicit drugs Their current medications and supplements The patient's functional ability including ADLs,fall risks, home safety risks, cognitive, and hearing and visual impairment Diet and physical activities Evidence for depression or mood disorders  The patient's weight, height, and BMI have been recorded in the chart.  I have made referrals, counseling, and provided education to the patient based on review of the above and I have provided the patient with a written personalized care plan for preventive services.     Jill Alexanders, MD   11/09/2017

## 2017-11-09 NOTE — Patient Instructions (Signed)
  Ms. Kinsella , Thank you for taking time to come for your Medicare Wellness Visit. I appreciate your ongoing commitment to your health goals. Please review the following plan we discussed and let me know if I can assist you in the future.   These are the goals we discussed: Goals   None     This is a list of the screening recommended for you and due dates:  Health Maintenance  Topic Date Due  . Flu Shot  09/21/2017  . Mammogram  09/06/2019  . Colon Cancer Screening  10/13/2024  . Tetanus Vaccine  05/31/2025  . DEXA scan (bone density measurement)  Completed  .  Hepatitis C: One time screening is recommended by Center for Disease Control  (CDC) for  adults born from [birth year] through 1966.   Completed  . Pneumonia vaccines  Completed

## 2017-11-10 LAB — COMPREHENSIVE METABOLIC PANEL
ALT: 16 IU/L (ref 0–32)
AST: 26 IU/L (ref 0–40)
Albumin/Globulin Ratio: 2 (ref 1.2–2.2)
Albumin: 4.7 g/dL (ref 3.5–4.8)
Alkaline Phosphatase: 118 IU/L — ABNORMAL HIGH (ref 39–117)
BUN/Creatinine Ratio: 22 (ref 12–28)
BUN: 20 mg/dL (ref 8–27)
Bilirubin Total: 0.8 mg/dL (ref 0.0–1.2)
CO2: 25 mmol/L (ref 20–29)
CREATININE: 0.9 mg/dL (ref 0.57–1.00)
Calcium: 10.8 mg/dL — ABNORMAL HIGH (ref 8.7–10.3)
Chloride: 98 mmol/L (ref 96–106)
GFR calc Af Amer: 73 mL/min/{1.73_m2} (ref >59)
GFR calc non Af Amer: 64 mL/min/{1.73_m2} (ref >59)
GLUCOSE: 88 mg/dL (ref 65–99)
Globulin, Total: 2.4 g/dL (ref 1.5–4.5)
Potassium: 4.1 mmol/L (ref 3.5–5.2)
Sodium: 141 mmol/L (ref 134–144)
Total Protein: 7.1 g/dL (ref 6.0–8.5)

## 2017-11-10 LAB — LIPID PANEL
Chol/HDL Ratio: 3.1 ratio (ref 0.0–4.4)
Cholesterol, Total: 152 mg/dL (ref 100–199)
HDL: 49 mg/dL (ref >39)
LDL CALC: 83 mg/dL (ref 0–99)
TRIGLYCERIDES: 101 mg/dL (ref 0–149)
VLDL CHOLESTEROL CAL: 20 mg/dL (ref 5–40)

## 2017-11-10 LAB — CBC WITH DIFFERENTIAL/PLATELET
BASOS: 0 %
Basophils Absolute: 0 10*3/uL (ref 0.0–0.2)
EOS (ABSOLUTE): 0.3 10*3/uL (ref 0.0–0.4)
EOS: 4 %
HEMOGLOBIN: 13.5 g/dL (ref 11.1–15.9)
Hematocrit: 40.2 % (ref 34.0–46.6)
IMMATURE GRANULOCYTES: 0 %
Immature Grans (Abs): 0 10*3/uL (ref 0.0–0.1)
LYMPHS ABS: 2 10*3/uL (ref 0.7–3.1)
Lymphs: 28 %
MCH: 28.8 pg (ref 26.6–33.0)
MCHC: 33.6 g/dL (ref 31.5–35.7)
MCV: 86 fL (ref 79–97)
MONOCYTES: 6 %
Monocytes Absolute: 0.4 10*3/uL (ref 0.1–0.9)
NEUTROS PCT: 62 %
Neutrophils Absolute: 4.5 10*3/uL (ref 1.4–7.0)
Platelets: 232 10*3/uL (ref 150–450)
RBC: 4.68 x10E6/uL (ref 3.77–5.28)
RDW: 14.9 % (ref 12.3–15.4)
WBC: 7.1 10*3/uL (ref 3.4–10.8)

## 2017-11-18 DIAGNOSIS — I442 Atrioventricular block, complete: Secondary | ICD-10-CM | POA: Diagnosis not present

## 2017-11-18 DIAGNOSIS — Z45018 Encounter for adjustment and management of other part of cardiac pacemaker: Secondary | ICD-10-CM | POA: Diagnosis not present

## 2017-11-18 DIAGNOSIS — Z950 Presence of cardiac pacemaker: Secondary | ICD-10-CM | POA: Diagnosis not present

## 2017-11-23 DIAGNOSIS — L301 Dyshidrosis [pompholyx]: Secondary | ICD-10-CM | POA: Diagnosis not present

## 2017-12-04 DIAGNOSIS — Z23 Encounter for immunization: Secondary | ICD-10-CM | POA: Diagnosis not present

## 2017-12-14 ENCOUNTER — Telehealth: Payer: Self-pay

## 2017-12-14 NOTE — Telephone Encounter (Signed)
ERROR

## 2018-01-08 DIAGNOSIS — R05 Cough: Secondary | ICD-10-CM | POA: Diagnosis not present

## 2018-01-08 DIAGNOSIS — I10 Essential (primary) hypertension: Secondary | ICD-10-CM | POA: Diagnosis not present

## 2018-01-08 DIAGNOSIS — J069 Acute upper respiratory infection, unspecified: Secondary | ICD-10-CM | POA: Diagnosis not present

## 2018-02-17 DIAGNOSIS — Z45018 Encounter for adjustment and management of other part of cardiac pacemaker: Secondary | ICD-10-CM | POA: Diagnosis not present

## 2018-02-17 DIAGNOSIS — I441 Atrioventricular block, second degree: Secondary | ICD-10-CM | POA: Diagnosis not present

## 2018-02-17 DIAGNOSIS — Z950 Presence of cardiac pacemaker: Secondary | ICD-10-CM | POA: Diagnosis not present

## 2018-03-16 DIAGNOSIS — N281 Cyst of kidney, acquired: Secondary | ICD-10-CM | POA: Diagnosis not present

## 2018-03-16 DIAGNOSIS — N200 Calculus of kidney: Secondary | ICD-10-CM | POA: Diagnosis not present

## 2018-03-26 IMAGING — CR DG CHEST 1V PORT SAME DAY
1 series · 1 of 1 positions shown · non-contrast
Comparison: Chest radiographs 01/21/2013

CLINICAL DATA: 70-year-old female with syncope yesterday. Cough
today. Initial encounter.

EXAM:
PORTABLE CHEST 1 VIEW

[ap]
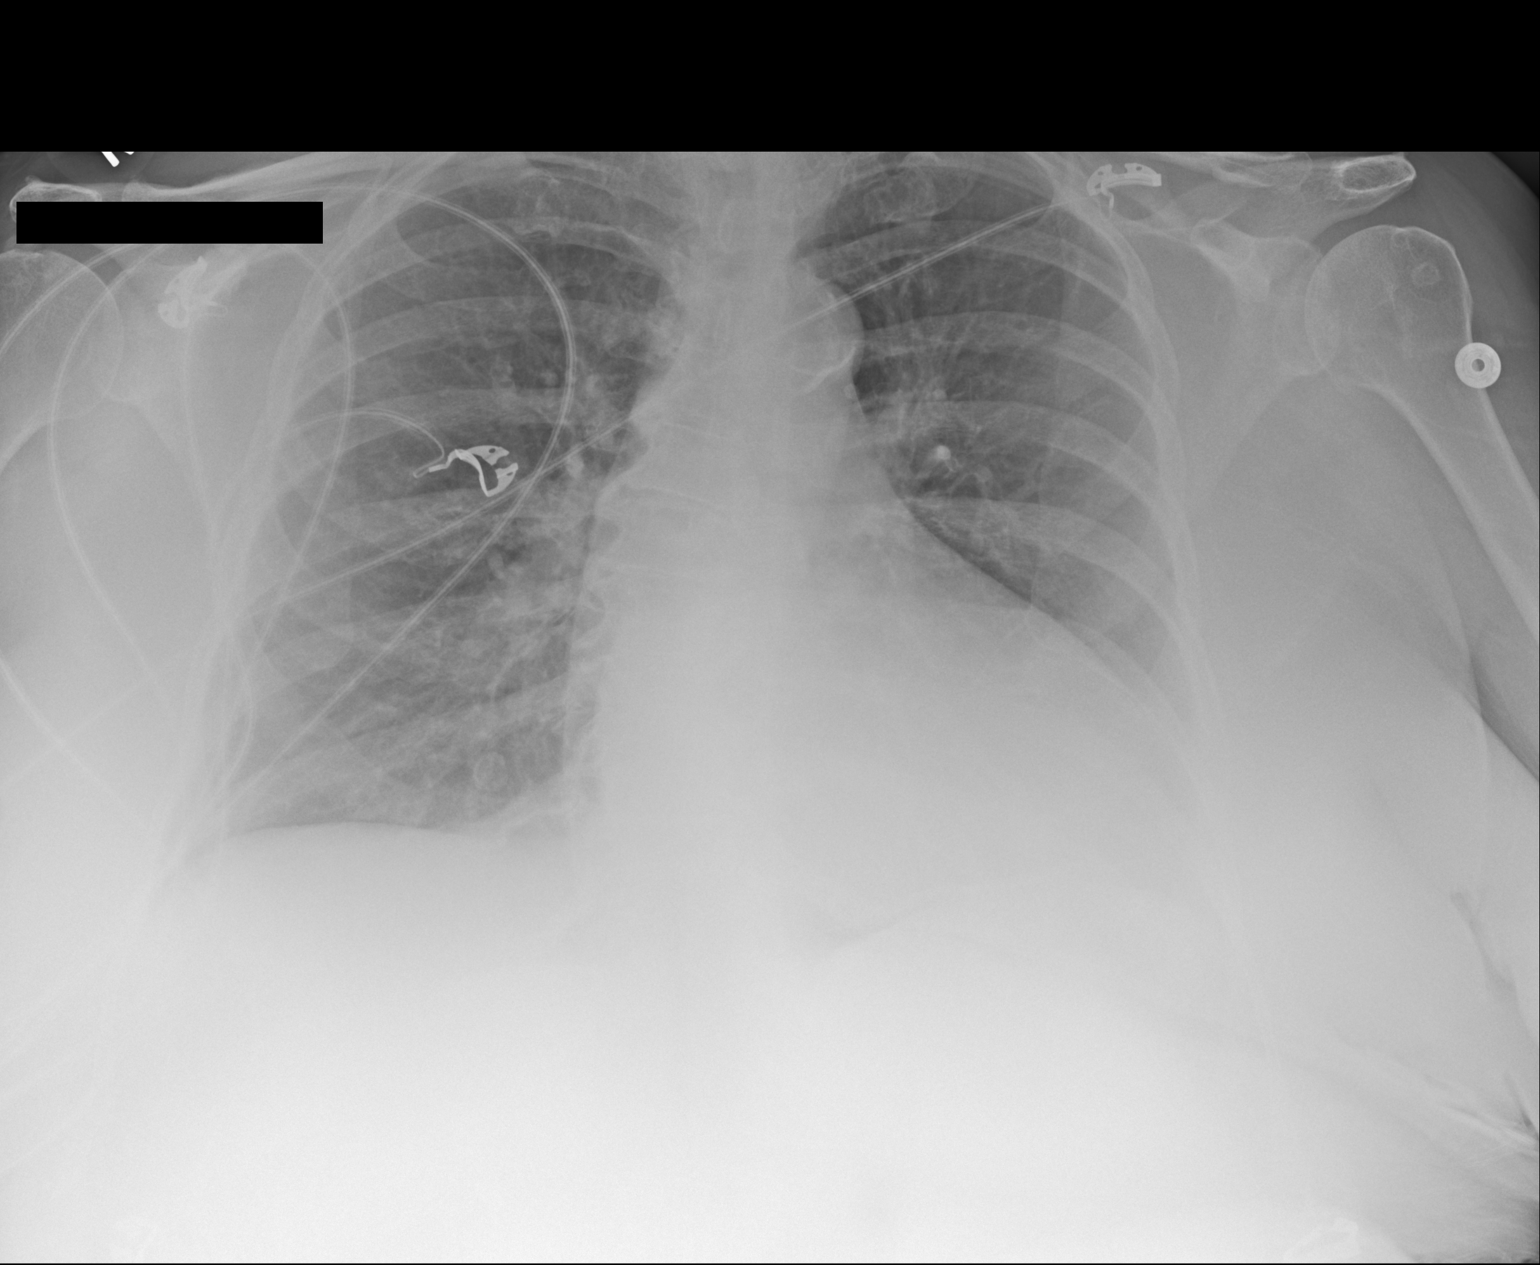

[1 of 1 positions shown; findings below may reference images not displayed]

FINDINGS: Portable AP semi upright view at 5991 hours. Mildly lordotic
positioning. Dextroconvex thoracic scoliosis. Stable mild
cardiomegaly. Other mediastinal contours are within normal limits.
Calcified aortic atherosclerosis. Visualized tracheal air column is
within normal limits. Increased pulmonary vascularity but no overt
edema. No pneumothorax, pleural effusion or consolidation.
IMPRESSION: 1. Pulmonary vascular congestion without overt edema or other acute
cardiopulmonary abnormality.
2. Stable chronic cardiomegaly.  Calcified aortic atherosclerosis.

## 2018-03-28 IMAGING — CR DG CHEST 2V
2 series · 2 of 2 positions shown · non-contrast
Comparison: May 19, 2016

CLINICAL DATA: Cardiac device in situ

EXAM:
CHEST  2 VIEW

[chest lat]
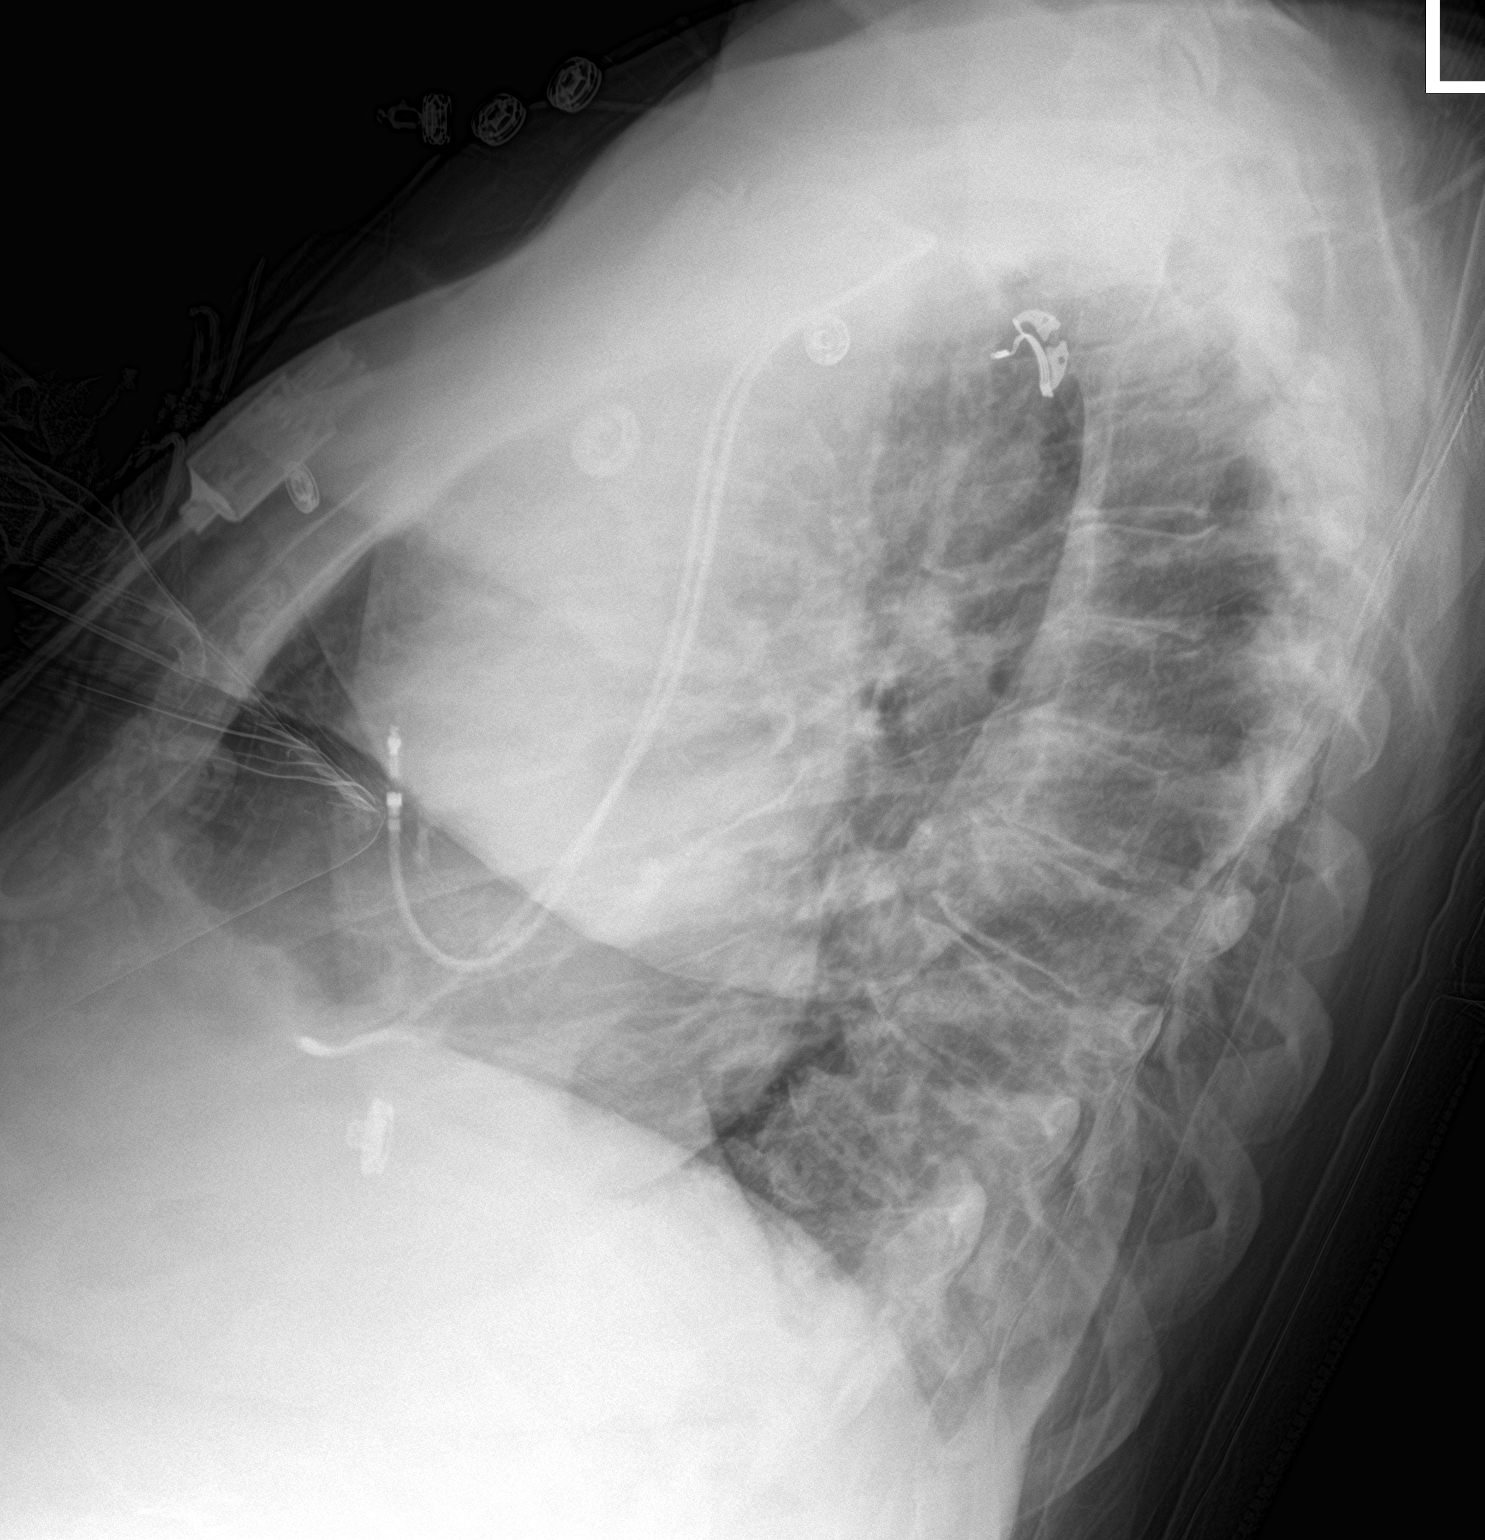

[chest ap]
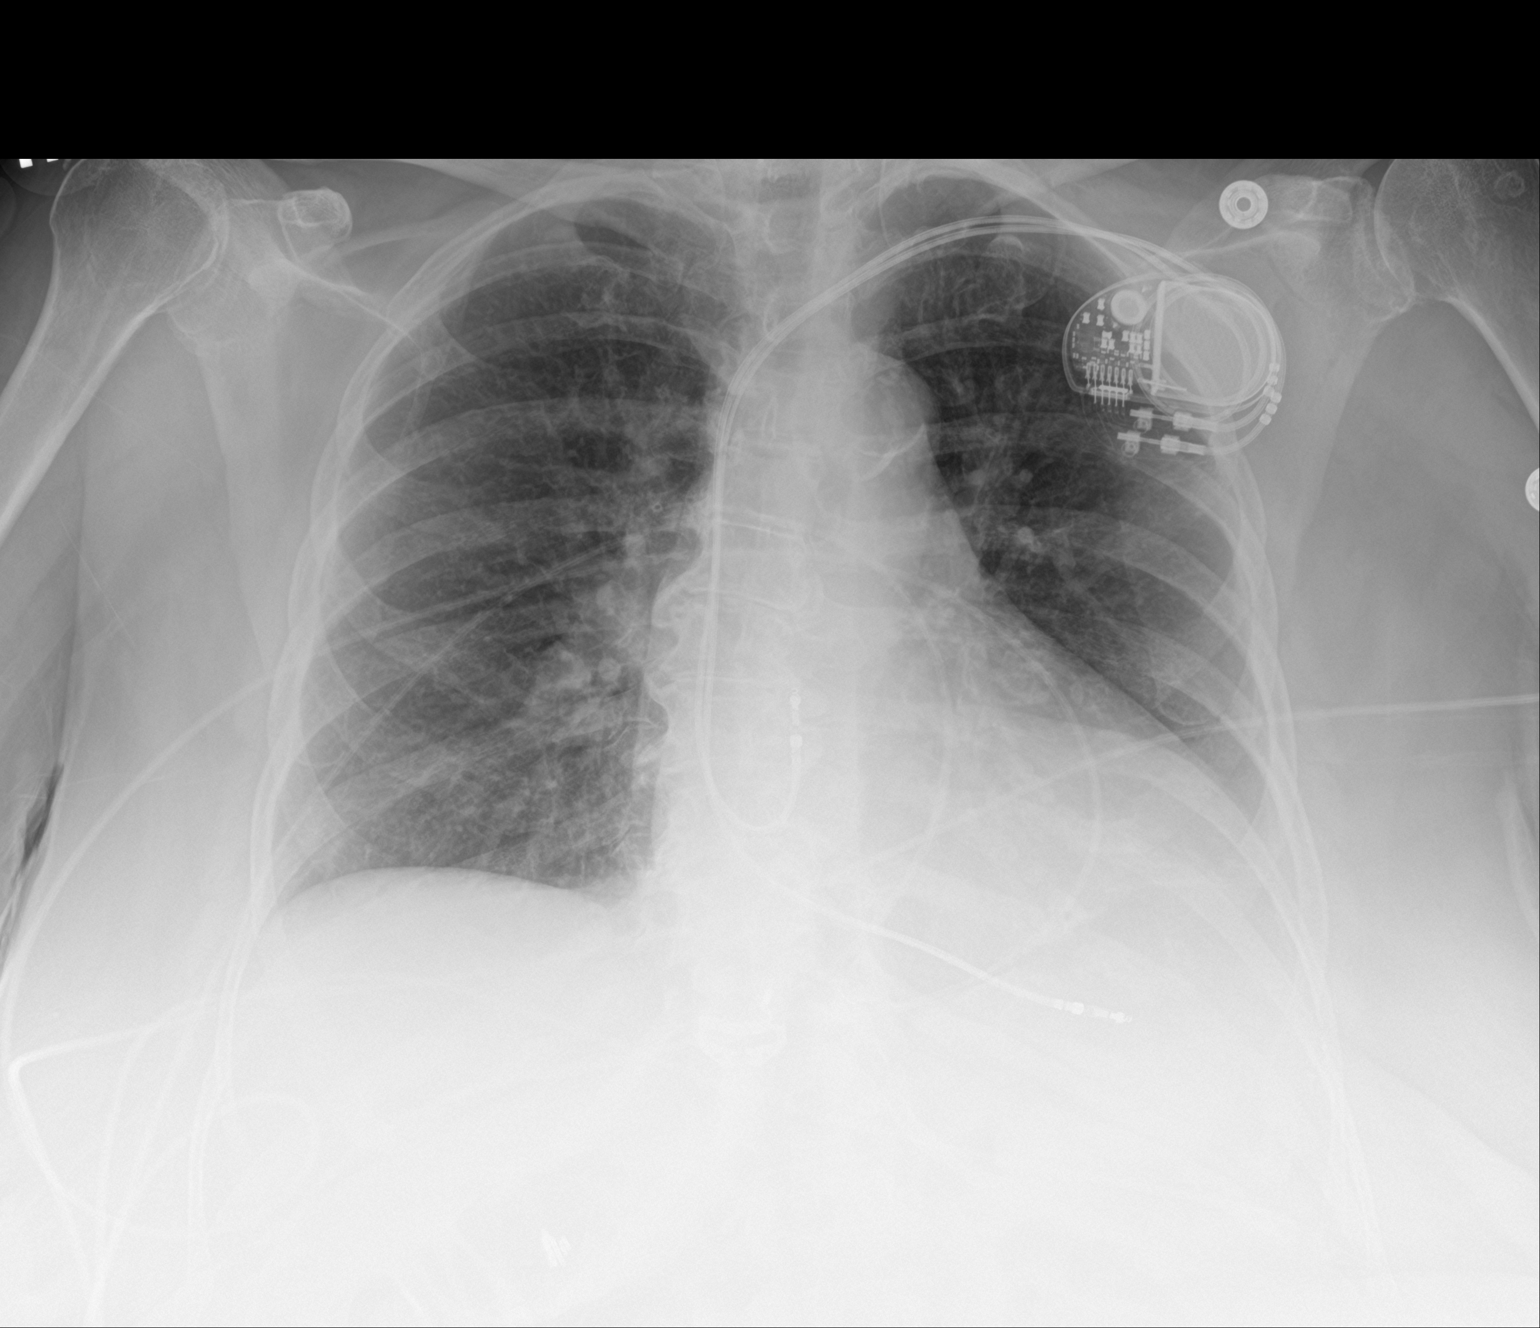

[2 of 2 positions shown; findings below may reference images not displayed]

FINDINGS: No pneumothorax. There is a new pacemaker device with tips projected
over the right atrium and right ventricle. Mild cardiomegaly. The
hila and mediastinum are unremarkable. Probable tiny left effusion
and left basilar atelectasis.
IMPRESSION: 1. New left-sided pacemaker with leads positioned as described
above. No pneumothorax. Probable mild atelectasis and tiny effusion
on the left.

## 2018-05-11 ENCOUNTER — Other Ambulatory Visit (INDEPENDENT_AMBULATORY_CARE_PROVIDER_SITE_OTHER): Payer: Medicare Other

## 2018-05-11 ENCOUNTER — Other Ambulatory Visit: Payer: Self-pay

## 2018-05-11 ENCOUNTER — Telehealth: Payer: Self-pay

## 2018-05-11 DIAGNOSIS — R300 Dysuria: Secondary | ICD-10-CM | POA: Diagnosis not present

## 2018-05-11 DIAGNOSIS — R350 Frequency of micturition: Secondary | ICD-10-CM | POA: Diagnosis not present

## 2018-05-11 LAB — POCT URINALYSIS DIP (PROADVANTAGE DEVICE)
BILIRUBIN UA: NEGATIVE
BILIRUBIN UA: NEGATIVE mg/dL
Blood, UA: NEGATIVE
GLUCOSE UA: NEGATIVE mg/dL
Leukocytes, UA: NEGATIVE
Nitrite, UA: NEGATIVE
Protein Ur, POC: NEGATIVE mg/dL
SPECIFIC GRAVITY, URINE: 1.02
Urobilinogen, Ur: NEGATIVE
pH, UA: 6 (ref 5.0–8.0)

## 2018-05-11 NOTE — Telephone Encounter (Signed)
Patient called stating she feels like she may have a UTI and would like to know if it is ok for her to bring in a urine sample versus coming in because she is elderly and don't want to collect any germs. Please advise.

## 2018-05-11 NOTE — Telephone Encounter (Signed)
Patient was informed to drop off urine specimen.

## 2018-05-11 NOTE — Progress Notes (Unsigned)
Pt was advised urine was normal and try azo standard over the counter to help with symptoms

## 2018-05-11 NOTE — Telephone Encounter (Signed)
Have her drop off a specimen

## 2018-05-16 ENCOUNTER — Other Ambulatory Visit: Payer: Self-pay | Admitting: Family Medicine

## 2018-05-16 ENCOUNTER — Other Ambulatory Visit: Payer: Self-pay | Admitting: Cardiology

## 2018-05-16 DIAGNOSIS — I10 Essential (primary) hypertension: Secondary | ICD-10-CM

## 2018-05-16 DIAGNOSIS — E785 Hyperlipidemia, unspecified: Secondary | ICD-10-CM

## 2018-05-19 DIAGNOSIS — Z45018 Encounter for adjustment and management of other part of cardiac pacemaker: Secondary | ICD-10-CM | POA: Diagnosis not present

## 2018-05-19 DIAGNOSIS — Z950 Presence of cardiac pacemaker: Secondary | ICD-10-CM | POA: Diagnosis not present

## 2018-08-18 DIAGNOSIS — I441 Atrioventricular block, second degree: Secondary | ICD-10-CM | POA: Diagnosis not present

## 2018-08-18 DIAGNOSIS — Z950 Presence of cardiac pacemaker: Secondary | ICD-10-CM | POA: Diagnosis not present

## 2018-08-18 DIAGNOSIS — Z45018 Encounter for adjustment and management of other part of cardiac pacemaker: Secondary | ICD-10-CM | POA: Diagnosis not present

## 2018-09-04 ENCOUNTER — Encounter: Payer: Self-pay | Admitting: Cardiology

## 2018-09-05 ENCOUNTER — Other Ambulatory Visit: Payer: Self-pay

## 2018-09-05 ENCOUNTER — Encounter: Payer: Self-pay | Admitting: Cardiology

## 2018-09-05 ENCOUNTER — Ambulatory Visit (INDEPENDENT_AMBULATORY_CARE_PROVIDER_SITE_OTHER): Payer: Medicare Other | Admitting: Cardiology

## 2018-09-05 VITALS — BP 154/80 | HR 92 | Ht 61.0 in | Wt 211.0 lb

## 2018-09-05 DIAGNOSIS — Z950 Presence of cardiac pacemaker: Secondary | ICD-10-CM | POA: Diagnosis not present

## 2018-09-05 DIAGNOSIS — I441 Atrioventricular block, second degree: Secondary | ICD-10-CM

## 2018-09-05 DIAGNOSIS — Z45018 Encounter for adjustment and management of other part of cardiac pacemaker: Secondary | ICD-10-CM

## 2018-09-05 DIAGNOSIS — I10 Essential (primary) hypertension: Secondary | ICD-10-CM | POA: Diagnosis not present

## 2018-09-05 HISTORY — DX: Encounter for adjustment and management of other part of cardiac pacemaker: Z45.018

## 2018-09-05 MED ORDER — METOPROLOL SUCCINATE ER 50 MG PO TB24: 50.0000 mg | ORAL_TABLET | Freq: Every day | ORAL | 1 refills | Status: DC

## 2018-09-05 NOTE — Progress Notes (Signed)
Primary Physician/Referring:  Denita Lung, MD  Patient ID: Margaret Reese, female    DOB: Jun 08, 1945, 73 y.o.   MRN: [id number]  Subjective   Chief Complaint  Patient presents with  . Hypertension    mobitz type II ATRIOVENTRICULAR  . Pacemaker Check   HPI: Margaret Reese  is a 73 y.o. female   Caucasian female with history of syncope and bradycardia and symptomatic with 2 AV block status post permanent pacemaker implantation on 05/20/2016 by Dr. Curt Bears. She now presents here for her annual visit and pacemaker check. She is essentially asymptomatic and has been exercising on a regular basis.She has chronic mild dyspnea. Past medical history includes; HTN, asthma, gastric ulcers, and GERD.  Past Medical History:  Diagnosis Date  . Allergy   . Arthritis   . Asthma   . Complication of anesthesia    pt states has difficulty awakening; also has increased sinus drainage  . Falls   . GERD (gastroesophageal reflux disease)   . H/O back injury   . History of bronchitis   . History of colon polyps   . Hypertension   . Imbalance   . Kidney cysts    pt states not sure which kidney does see kidney specialist yearly pt states every thing okay currently   . Legally blind in right eye, as defined in [place]   . Multiple gastric ulcers   . Obesity   . Pacemaker S/P [place] MRI model [id number] 05/20/2016  . Renal stone   . Retinal vein occlusion   . Tingling    left arm   . Trace cataracts   . Urinary frequency     Past Surgical History:  Procedure Laterality Date  . CARDIOVASCULAR STRESS TEST  dec 2016  . CHOLECYSTECTOMY    . COLONOSCOPY  2011  . EYE SURGERY Bilateral    Cataract surgery.   Marland Kitchen LEFT HEART CATH AND CORONARY ANGIOGRAPHY N/A 05/19/2016   Procedure: Left Heart Cath and Coronary Angiography;  Surgeon: Dixie Dials, MD;  Location: [facility];  Service: Cardiovascular;  Laterality: N/A;  . PACEMAKER IMPLANT N/A 05/20/2016   Procedure: Pacemaker  Implant;  Surgeon: Will Meredith Leeds, MD;  Location: [facility];  Service: Cardiovascular;  Laterality: N/A;  . TONSILLECTOMY    . TOTAL HIP ARTHROPLASTY Right 01/30/2015   Procedure: RIGHT TOTAL HIP ARTHROPLASTY ANTERIOR APPROACH AND REMOVAL LIPOMA RIGHT HIP;  Surgeon: Mcarthur Rossetti, MD;  Location: [facility];  Service: Orthopedics;  Laterality: Right;  . UPPER GI ENDOSCOPY      Social History   Socioeconomic History  . Marital status: Married    Spouse name: Not on file  . Number of children: 2  . Years of education: Not on file  . Highest education level: Not on file  Occupational History  . Not on file  Social Needs  . Financial resource strain: Not on file  . Food insecurity    Worry: Not on file    Inability: Not on file  . Transportation needs    Medical: Not on file    Non-medical: Not on file  Tobacco Use  . Smoking status: Never Smoker  . Smokeless tobacco: Never Used  Substance and Sexual Activity  . Alcohol use: No  . Drug use: No  . Sexual activity: Yes  Lifestyle  . Physical activity    Days per week: Not on file    Minutes per session: Not on  file  . Stress: Not on file  Relationships  . Social Herbalist on phone: Not on file    Gets together: Not on file    Attends religious service: Not on file    Active member of club or organization: Not on file    Attends meetings of clubs or organizations: Not on file    Relationship status: Not on file  . Intimate partner violence    Fear of current or ex partner: Not on file    Emotionally abused: Not on file    Physically abused: Not on file    Forced sexual activity: Not on file  Other Topics Concern  . Not on file  Social History Narrative  . Not on file   Review of Systems  Constitution: Negative for chills, decreased appetite, malaise/fatigue and weight gain.  Cardiovascular: Positive for dyspnea on exertion (mild). Negative for leg swelling and syncope.  Endocrine:  Negative for cold intolerance.  Hematologic/Lymphatic: Does not bruise/bleed easily.  Musculoskeletal: Negative for joint swelling.  Gastrointestinal: Negative for abdominal pain, anorexia, change in bowel habit, hematochezia and melena.  Neurological: Negative for headaches and light-headedness.  Psychiatric/Behavioral: Negative for depression and substance abuse.  All other systems reviewed and are negative.  Objective  Blood pressure (!) 154/80, pulse 92, weight 211 lb (95.7 kg), SpO2 96 %. Body mass index is 39.87 kg/m.   Physical Exam  Constitutional: She appears well-developed. No distress.  Morbidly obese  HENT:  Head: Atraumatic.  Eyes: Conjunctivae are normal.  Neck: Neck supple. No thyromegaly present.  Short neck and difficult to evaluate JVP  Cardiovascular: Normal rate, regular rhythm and normal heart sounds. Exam reveals no gallop.  No murmur heard. Pulses:      Carotid pulses are 2+ on the right side and 2+ on the left side.      Dorsalis pedis pulses are 2+ on the right side and 2+ on the left side.       Posterior tibial pulses are 2+ on the right side and 2+ on the left side.  Femoral and popliteal pulse difficult to feel due to patient's body habitus.   Pulmonary/Chest: Effort normal and breath sounds normal.  Pacemaker/ICD site noted  in the left infraclavicular fossa.    Abdominal: Soft. Bowel sounds are normal.  Obese. Pannus present  Musculoskeletal: Normal range of motion.        General: No edema.  Neurological: She is alert.  Skin: Skin is warm and dry.  Psychiatric: She has a normal mood and affect.   Radiology: No results found.  Laboratory examination:   CMP Latest Ref Rng & Units 11/09/2017 11/08/2016 05/20/2016  Glucose 65 - 99 mg/dL 88 90 99  BUN 8 - 27 mg/dL 20 18 17   Creatinine 0.57 - 1.00 mg/dL 0.90 0.96(H) 0.92  Sodium 134 - 144 mmol/L 141 141 141  Potassium 3.5 - 5.2 mmol/L 4.1 3.8 4.2  Chloride 96 - 106 mmol/L 98 100 106  CO2 20 - 29  mmol/L 25 30 28   Calcium 8.7 - 10.3 mg/dL 10.8(H) 10.2 8.9  Total Protein 6.0 - 8.5 g/dL 7.1 7.2 -  Total Bilirubin 0.0 - 1.2 mg/dL 0.8 0.9 -  Alkaline Phos 39 - 117 IU/L 118(H) - -  AST 0 - 40 IU/L 26 18 -  ALT 0 - 32 IU/L 16 13 -   CBC Latest Ref Rng & Units 11/09/2017 11/08/2016 05/20/2016  WBC 3.4 - 10.8 x10E3/uL 7.1 6.7  7.7  Hemoglobin 11.1 - 15.9 g/dL 13.5 12.7 10.4(L)  Hematocrit 34.0 - 46.6 % 40.2 38.9 32.5(L)  Platelets 150 - 450 x10E3/uL 232 218 178   Lipid Panel     Component Value Date/Time   CHOL 152 11/09/2017 0945   TRIG 101 11/09/2017 0945   HDL 49 11/09/2017 0945   CHOLHDL 3.1 11/09/2017 0945   CHOLHDL 2.6 11/08/2016 1035   VLDL 20 09/15/2015 0914   LDLCALC 83 11/09/2017 0945   LDLCALC 75 11/08/2016 1035   HEMOGLOBIN A1C No results found for: HGBA1C, MPG TSH No results for input(s): TSH in the last 8760 hours.  Medications   Current Outpatient Medications  Medication Instructions  . acetaminophen (TYLENOL) 325-650 mg, Oral, Every 4 hours PRN  . Albuterol Sulfate (PROAIR RESPICLICK IN) Inhalation  . aspirin 81 mg, Oral, Daily  . Cholecalciferol (VITAMIN D3) 1000 UNITS CAPS 1 capsule, Oral, Every evening  . fish oil-omega-3 fatty acids 1 g, Oral, Daily,  Taking 1200mg   . lisinopril-hydrochlorothiazide (PRINZIDE,ZESTORETIC) 10-12.5 MG tablet TAKE 1 TABLET BY MOUTH EVERY DAY  . metoprolol succinate (TOPROL-XL) 50 mg, Oral, Daily, Take with or immediately following a meal.  . Multiple Vitamins-Minerals (CENTRUM SILVER 50+WOMEN PO) 1 tablet, Oral, Daily  . Multiple Vitamins-Minerals (PRESERVISION AREDS 2) CAPS 1 capsule, Oral, 2 times daily  . potassium citrate (UROCIT-K) 5 MEQ (540 MG) SR tablet 5 mEq, Oral, 2 times daily  . simvastatin (ZOCOR) 20 MG tablet TAKE 1 (ONE)TABLET DAILY IN THE EVENING    Cardiac Studies:   Nuclear stress test [01/23/2015]:  1. The resting electrocardiogram demonstrated normal sinus rhythm, RBBB and no resting arrhythmias.  Stress EKG is non-diagnostic for ischemia as it a pharmacologic stress using Lexiscan. Stress symptoms included dyspnea. 2. Myocardial perfusion imaging is normal. Overall left ventricular systolic function was normal without regional wall motion abnormalities. The left ventricular ejection fraction was 78%. This is a normal/low risk study, based on the study, patient can be taken up for the surgery with acceptable CV risk.  Echocardiogram [03/10/2015]:  Patient tachycardic. Left ventricle cavity is normal in size. Moderate concentric hypertrophy of the left ventricle. Doppler evidence of grade III (restrictive) diastolic dysfunction. Visual EF is >70%. Diastolic dysfunction findings suggests elevated LA/LV end diastolic pressure. Calculated EF 63%. Left atrial cavity is normal in size. A patent foramen ovale is probably present by color Doppler evaluation. Right ventricle cavity is mildly dilated. Normal right ventricular function. Mild tricuspid regurgitation. No evidence of pulmonary hypertension.  Coronary Angiogram [05/20/2015]: Normal.  Assessment  Encounter for interrogation of cardiac pacemaker   Pacemaker S/P [employer] MRI model [phone]   Mobitz type 2 second degree AV block  Essential hypertension - Plan: metoprolol succinate (TOPROL-XL) 50 MG 24 hr tablet, DISCONTINUED: metoprolol succinate (TOPROL-XL) 50 MG 24 hr tablet  In office pacemaker check 09/05/2018:  Presenting NSR. Ap 20%, VP 2%. No mode switches. Normal lead function. Daily HR range 80-90. Activity good. Longevity >8 years. Normal device function.   Remote 4.21.20: Normal function. No AMS episodes. One PMT with no EGM. AP <2%, VP <5%.  Recommendations:   Patient presenting for annual visit and follow-up of hypertension, Mobitz 2 AV block and pacemaker check.  Blood pressure remains elevated, also her average heart rate is around 90 bpm on pacemaker interrogation, we'll add Metoprolol succinate 50 mg  daily, which would help both for reducing heart rate and also better control of hypertension.  She'll return in one month for blood pressure  check and also to evaluate for tolerability.  With regard to pacemaker check, heart block, pacemaker is functioning normally, utilizing 20% (A-Paced). I will continue remote monitoring. Weigh loss discussed as she is nearly morbid and will help with BP control as well as improve exercise tolerance.   Adrian Prows, MD, [facility] 09/05/2018, 10:38 PM [facility]. [place] Pager: [phone] Office: [phone] If no answer Cell [phone]

## 2018-09-12 ENCOUNTER — Telehealth: Payer: Self-pay

## 2018-09-12 NOTE — Telephone Encounter (Signed)
Pt aware of transmission on 09/05/2018

## 2018-09-27 ENCOUNTER — Other Ambulatory Visit: Payer: Self-pay | Admitting: Cardiology

## 2018-09-27 DIAGNOSIS — I10 Essential (primary) hypertension: Secondary | ICD-10-CM

## 2018-10-02 DIAGNOSIS — Z1231 Encounter for screening mammogram for malignant neoplasm of breast: Secondary | ICD-10-CM | POA: Diagnosis not present

## 2018-10-02 LAB — HM MAMMOGRAPHY

## 2018-10-10 ENCOUNTER — Encounter: Payer: Self-pay | Admitting: Cardiology

## 2018-10-10 ENCOUNTER — Ambulatory Visit (INDEPENDENT_AMBULATORY_CARE_PROVIDER_SITE_OTHER): Payer: Medicare Other | Admitting: Cardiology

## 2018-10-10 ENCOUNTER — Other Ambulatory Visit: Payer: Self-pay

## 2018-10-10 VITALS — BP 143/67 | HR 82 | Ht 61.0 in | Wt 213.0 lb

## 2018-10-10 DIAGNOSIS — Z950 Presence of cardiac pacemaker: Secondary | ICD-10-CM

## 2018-10-10 DIAGNOSIS — I10 Essential (primary) hypertension: Secondary | ICD-10-CM | POA: Diagnosis not present

## 2018-10-10 DIAGNOSIS — J4520 Mild intermittent asthma, uncomplicated: Secondary | ICD-10-CM | POA: Diagnosis not present

## 2018-10-10 MED ORDER — OLMESARTAN MEDOXOMIL-HCTZ 40-12.5 MG PO TABS: 1.0000 | ORAL_TABLET | Freq: Every day | ORAL | 2 refills | Status: DC

## 2018-10-10 NOTE — Progress Notes (Signed)
Primary Physician/Referring:  Denita Lung, MD  Patient ID: Margaret Reese, female    DOB: Jan 05, 1946, 73 y.o.   MRN: [id number]  Subjective   Chief Complaint  Patient presents with  . Hypertension  . Follow-up   HPI: Margaret Reese  is a 73 y.o. female   Caucasian female with history of syncope and bradycardia and symptomatic with 2 AV block status post permanent pacemaker implantation on 05/20/2016 by Dr. Curt Bears, recently seen for pacemaker check and noted to have tachycardia and elevated blood pressure. Started on Metoprolol succinate and now presents for follow up.  She states initially after starting the medication, she did not feel well. Reported fatigue, lack of desire to do things, and dizziness; however, over the last 1 week that has essentially resolved. She has continued to exercise daily and tolerates this well. She does notice worsening asthma, but was noted prior to starting Metoprolol. BP has improved. She is to see her PCP in 6 weeks.   She has chronic mild dyspnea. No chest pain or leg edema. Past medical history includes; HTN, asthma, gastric ulcers, and GERD.  Past Medical History:  Diagnosis Date  . Allergy   . Arthritis   . Asthma   . Complication of anesthesia    pt states has difficulty awakening; also has increased sinus drainage  . Falls   . GERD (gastroesophageal reflux disease)   . H/O back injury   . History of bronchitis   . History of colon polyps   . Hypertension   . Imbalance   . Kidney cysts    pt states not sure which kidney does see kidney specialist yearly pt states every thing okay currently   . Legally blind in right eye, as defined in [place]   . Multiple gastric ulcers   . Obesity   . Pacemaker S/P [place] MRI model [id number] 05/20/2016  . Renal stone   . Retinal vein occlusion   . Tingling    left arm   . Trace cataracts   . Urinary frequency     Past Surgical History:  Procedure Laterality Date  .  CARDIOVASCULAR STRESS TEST  dec 2016  . CHOLECYSTECTOMY    . COLONOSCOPY  2011  . EYE SURGERY Bilateral    Cataract surgery.   Marland Kitchen LEFT HEART CATH AND CORONARY ANGIOGRAPHY N/A 05/19/2016   Procedure: Left Heart Cath and Coronary Angiography;  Surgeon: Dixie Dials, MD;  Location: [facility];  Service: Cardiovascular;  Laterality: N/A;  . PACEMAKER IMPLANT N/A 05/20/2016   Procedure: Pacemaker Implant;  Surgeon: Will Meredith Leeds, MD;  Location: [facility];  Service: Cardiovascular;  Laterality: N/A;  . TONSILLECTOMY    . TOTAL HIP ARTHROPLASTY Right 01/30/2015   Procedure: RIGHT TOTAL HIP ARTHROPLASTY ANTERIOR APPROACH AND REMOVAL LIPOMA RIGHT HIP;  Surgeon: Mcarthur Rossetti, MD;  Location: [facility];  Service: Orthopedics;  Laterality: Right;  . UPPER GI ENDOSCOPY      Social History   Socioeconomic History  . Marital status: Married    Spouse name: Not on file  . Number of children: 2  . Years of education: Not on file  . Highest education level: Not on file  Occupational History  . Not on file  Social Needs  . Financial resource strain: Not on file  . Food insecurity    Worry: Not on file    Inability: Not on file  . Transportation needs  Medical: Not on file    Non-medical: Not on file  Tobacco Use  . Smoking status: Never Smoker  . Smokeless tobacco: Never Used  Substance and Sexual Activity  . Alcohol use: No  . Drug use: No  . Sexual activity: Yes  Lifestyle  . Physical activity    Days per week: Not on file    Minutes per session: Not on file  . Stress: Not on file  Relationships  . Social Herbalist on phone: Not on file    Gets together: Not on file    Attends religious service: Not on file    Active member of club or organization: Not on file    Attends meetings of clubs or organizations: Not on file    Relationship status: Not on file  . Intimate partner violence    Fear of current or ex partner: Not on file    Emotionally  abused: Not on file    Physically abused: Not on file    Forced sexual activity: Not on file  Other Topics Concern  . Not on file  Social History Narrative  . Not on file   Review of Systems  Constitution: Negative for chills, decreased appetite, malaise/fatigue and weight gain.  Cardiovascular: Positive for dyspnea on exertion (mild). Negative for chest pain, leg swelling, palpitations and syncope.  Endocrine: Negative for cold intolerance.  Hematologic/Lymphatic: Does not bruise/bleed easily.  Musculoskeletal: Negative for joint swelling.  Gastrointestinal: Negative for abdominal pain, anorexia, change in bowel habit, hematochezia and melena.  Neurological: Negative for headaches and light-headedness.  Psychiatric/Behavioral: Negative for depression and substance abuse.  All other systems reviewed and are negative.  Objective  Blood pressure (!) 143/67, pulse 82, height 5\' 1"  (1.549 m), weight 213 lb (96.6 kg), SpO2 95 %. Body mass index is 40.25 kg/m.   Physical Exam  Constitutional: She appears well-developed. No distress.  Morbidly obese  HENT:  Head: Atraumatic.  Eyes: Conjunctivae are normal.  Neck: Neck supple. No thyromegaly present.  Short neck and difficult to evaluate JVP  Cardiovascular: Normal rate, regular rhythm and normal heart sounds. Exam reveals no gallop.  No murmur heard. Pulses:      Carotid pulses are 2+ on the right side and 2+ on the left side.      Dorsalis pedis pulses are 2+ on the right side and 2+ on the left side.       Posterior tibial pulses are 2+ on the right side and 2+ on the left side.  Femoral and popliteal pulse difficult to feel due to patient's body habitus.   Pulmonary/Chest: Effort normal and breath sounds normal.  Pacemaker/ICD site noted  in the left infraclavicular fossa.    Abdominal: Soft. Bowel sounds are normal.  Obese. Pannus present  Musculoskeletal: Normal range of motion.        General: No edema.  Neurological: She  is alert.  Skin: Skin is warm and dry.  Psychiatric: She has a normal mood and affect.   Radiology: No results found.  Laboratory examination:   CMP Latest Ref Rng & Units 11/09/2017 11/08/2016 05/20/2016  Glucose 65 - 99 mg/dL 88 90 99  BUN 8 - 27 mg/dL 20 18 17   Creatinine 0.57 - 1.00 mg/dL 0.90 0.96(H) 0.92  Sodium 134 - 144 mmol/L 141 141 141  Potassium 3.5 - 5.2 mmol/L 4.1 3.8 4.2  Chloride 96 - 106 mmol/L 98 100 106  CO2 20 - 29 mmol/L 25 30  28  Calcium 8.7 - 10.3 mg/dL 10.8(H) 10.2 8.9  Total Protein 6.0 - 8.5 g/dL 7.1 7.2 -  Total Bilirubin 0.0 - 1.2 mg/dL 0.8 0.9 -  Alkaline Phos 39 - 117 IU/L 118(H) - -  AST 0 - 40 IU/L 26 18 -  ALT 0 - 32 IU/L 16 13 -   CBC Latest Ref Rng & Units 11/09/2017 11/08/2016 05/20/2016  WBC 3.4 - 10.8 x10E3/uL 7.1 6.7 7.7  Hemoglobin 11.1 - 15.9 g/dL 13.5 12.7 10.4(L)  Hematocrit 34.0 - 46.6 % 40.2 38.9 32.5(L)  Platelets 150 - 450 x10E3/uL 232 218 178   Lipid Panel     Component Value Date/Time   CHOL 152 11/09/2017 0945   TRIG 101 11/09/2017 0945   HDL 49 11/09/2017 0945   CHOLHDL 3.1 11/09/2017 0945   CHOLHDL 2.6 11/08/2016 1035   VLDL 20 09/15/2015 0914   LDLCALC 83 11/09/2017 0945   LDLCALC 75 11/08/2016 1035   HEMOGLOBIN A1C No results found for: HGBA1C, MPG TSH No results for input(s): TSH in the last 8760 hours.  Medications   Current Outpatient Medications  Medication Instructions  . acetaminophen (TYLENOL) 325-650 mg, Oral, Every 4 hours PRN  . Albuterol Sulfate (PROAIR RESPICLICK IN) Inhalation  . aspirin 81 mg, Oral, Daily  . Cholecalciferol (VITAMIN D3) 1000 UNITS CAPS 1 capsule, Oral, Every evening  . fish oil-omega-3 fatty acids 1 g, Oral, Daily,  Taking 1200mg   . metoprolol succinate (TOPROL-XL) 50 mg, Oral, Daily, Take with or immediately following a meal.  . Multiple Vitamins-Minerals (CENTRUM SILVER 50+WOMEN PO) 1 tablet, Oral, Daily  . Multiple Vitamins-Minerals (PRESERVISION AREDS 2) CAPS 1 capsule, Oral, 2  times daily  . olmesartan-hydrochlorothiazide (BENICAR HCT) 40-12.5 MG tablet 1 tablet, Oral, Daily  . potassium citrate (UROCIT-K) 5 MEQ (540 MG) SR tablet 5 mEq, Oral, 2 times daily  . simvastatin (ZOCOR) 20 MG tablet TAKE 1 (ONE)TABLET DAILY IN THE EVENING    Cardiac Studies:   Nuclear stress test [01/23/2015]:  1. The resting electrocardiogram demonstrated normal sinus rhythm, RBBB and no resting arrhythmias. Stress EKG is non-diagnostic for ischemia as it a pharmacologic stress using Lexiscan. Stress symptoms included dyspnea. 2. Myocardial perfusion imaging is normal. Overall left ventricular systolic function was normal without regional wall motion abnormalities. The left ventricular ejection fraction was 78%. This is a normal/low risk study, based on the study, patient can be taken up for the surgery with acceptable CV risk.  Echocardiogram [03/10/2015]:  Patient tachycardic. Left ventricle cavity is normal in size. Moderate concentric hypertrophy of the left ventricle. Doppler evidence of grade III (restrictive) diastolic dysfunction. Visual EF is >70%. Diastolic dysfunction findings suggests elevated LA/LV end diastolic pressure. Calculated EF 63%. Left atrial cavity is normal in size. A patent foramen ovale is probably present by color Doppler evaluation. Right ventricle cavity is mildly dilated. Normal right ventricular function. Mild tricuspid regurgitation. No evidence of pulmonary hypertension.  Coronary Angiogram [05/20/2015]: Normal.  Assessment     ICD-10-CM   1. Essential hypertension  I10 EKG 12-Lead  2. Pacemaker S/P [employer] Medical [employer]urity MRI model [id number]  Z95.0   3. Mild intermittent asthma without complication  Z32.99   4. Obesity, Class III, BMI 40-49.9 (morbid obesity) ([place])  E66.01    EKG 10/10/2018: Normal sinus rhythm with V-paced rhythm at 77 bpm, no further analysis due to V-paced rhythm.  In office pacemaker check 09/05/2018:  Presenting NSR. Ap  20%, VP 2%. No mode switches. Normal lead function.  Daily HR range 80-90. Activity good. Longevity >8 years. Normal device function.   Remote 4.21.20: Normal function. No AMS episodes. One PMT with no EGM. AP <2%, VP <5%.  Recommendations:   Patient initially had brief episodic dizziness and profound fatigue with starting metoprolol, but this has been improving over the last few days and has essentially resolved.  She has noticed improvement in heart racing symptoms.  Blood pressure did improve, but is still uncontrolled.  Heart rate has improved into the 70s.  We will continue with metoprolol for now unless she has recurrent worsening symptoms of dizziness and fatigue.  In regard to hypertension, will change lisinopril hydrochlorothiazide to olmesartan hydrochlorothiazide to see if she has better BP control with this.  She is to see her PCP in 6 weeks and will have this reevaluated at that time.  States she will also have labs performed as well. I suspect that weight loss will also help with hypertension control. Encouraged diet modifications.   Recent in office pacemaker evaluation showed normal pacemaker function.  She does report some worsening asthma exacerbations over the last several months, suspect related to increased exercise as she has exercise-induced asthma.  She will discuss with her PCP. I will see her back in 3 months or sooner if needed.  Miquel Dunn, MSN, APRN, FNP-C [facility]. [place] Office: [phone] Fax: [phone]

## 2018-10-12 ENCOUNTER — Encounter: Payer: Self-pay | Admitting: Cardiology

## 2018-10-20 ENCOUNTER — Other Ambulatory Visit: Payer: Self-pay | Admitting: Cardiology

## 2018-10-20 DIAGNOSIS — I10 Essential (primary) hypertension: Secondary | ICD-10-CM

## 2018-10-26 DIAGNOSIS — B029 Zoster without complications: Secondary | ICD-10-CM | POA: Diagnosis not present

## 2018-11-09 ENCOUNTER — Other Ambulatory Visit: Payer: Self-pay | Admitting: Family Medicine

## 2018-11-09 DIAGNOSIS — I10 Essential (primary) hypertension: Secondary | ICD-10-CM

## 2018-11-16 DIAGNOSIS — Z23 Encounter for immunization: Secondary | ICD-10-CM | POA: Diagnosis not present

## 2018-11-17 DIAGNOSIS — I441 Atrioventricular block, second degree: Secondary | ICD-10-CM | POA: Diagnosis not present

## 2018-11-17 DIAGNOSIS — Z45018 Encounter for adjustment and management of other part of cardiac pacemaker: Secondary | ICD-10-CM | POA: Diagnosis not present

## 2018-11-17 DIAGNOSIS — Z950 Presence of cardiac pacemaker: Secondary | ICD-10-CM | POA: Diagnosis not present

## 2018-11-20 ENCOUNTER — Ambulatory Visit (INDEPENDENT_AMBULATORY_CARE_PROVIDER_SITE_OTHER): Payer: Medicare Other | Admitting: Family Medicine

## 2018-11-20 ENCOUNTER — Other Ambulatory Visit: Payer: Self-pay | Admitting: Cardiology

## 2018-11-20 ENCOUNTER — Encounter: Payer: Self-pay | Admitting: Family Medicine

## 2018-11-20 ENCOUNTER — Other Ambulatory Visit: Payer: Self-pay

## 2018-11-20 VITALS — BP 122/78 | HR 81 | Temp 98.2°F | Ht 61.0 in | Wt 216.4 lb

## 2018-11-20 DIAGNOSIS — Z950 Presence of cardiac pacemaker: Secondary | ICD-10-CM | POA: Diagnosis not present

## 2018-11-20 DIAGNOSIS — J4520 Mild intermittent asthma, uncomplicated: Secondary | ICD-10-CM | POA: Diagnosis not present

## 2018-11-20 DIAGNOSIS — Z96641 Presence of right artificial hip joint: Secondary | ICD-10-CM | POA: Diagnosis not present

## 2018-11-20 DIAGNOSIS — N281 Cyst of kidney, acquired: Secondary | ICD-10-CM

## 2018-11-20 DIAGNOSIS — N200 Calculus of kidney: Secondary | ICD-10-CM | POA: Diagnosis not present

## 2018-11-20 DIAGNOSIS — I10 Essential (primary) hypertension: Secondary | ICD-10-CM

## 2018-11-20 DIAGNOSIS — J301 Allergic rhinitis due to pollen: Secondary | ICD-10-CM | POA: Diagnosis not present

## 2018-11-20 DIAGNOSIS — K219 Gastro-esophageal reflux disease without esophagitis: Secondary | ICD-10-CM

## 2018-11-20 DIAGNOSIS — E785 Hyperlipidemia, unspecified: Secondary | ICD-10-CM | POA: Diagnosis not present

## 2018-11-20 DIAGNOSIS — M1990 Unspecified osteoarthritis, unspecified site: Secondary | ICD-10-CM

## 2018-11-20 DIAGNOSIS — E66813 Obesity, class 3: Secondary | ICD-10-CM

## 2018-11-20 MED ORDER — SIMVASTATIN 20 MG PO TABS: ORAL_TABLET | ORAL | 3 refills | Status: DC

## 2018-11-20 NOTE — Progress Notes (Signed)
Margaret Reese is a 73 y.o. female who presents for annual wellness visit and follow-up on chronic medical conditions.  She continues to be followed by cardiology for her underlying AV block and pacemaker.  This seems to be going well.  Recently her blood pressure medications were readjusted.  Now she is taking metoprolol and Benicar HCT.  She sees urology regularly for renal stones as well as a renal cyst.  Reflux is causing no difficulty.  Her allergies seem to be under good control and she does use an inhaler intermittently for her asthma.  Arthritis causes minimal difficulty requiring rare use of Tylenol.  She continues on Zocor for her cholesterol.  She exercises 1-1/2 hours daily however see no real change in her weight. Immunizations and Health Maintenance Immunization History  Administered Date(s) Administered  . Influenza Split 11/07/2011, 12/01/2012  . Influenza Whole 01/11/2006, 11/28/2008, 11/30/2010  . Influenza-Unspecified 12/08/2014, 12/07/2015, 11/21/2016  . Pneumococcal Conjugate-13 09/15/2015  . Pneumococcal Polysaccharide-23 09/07/2004, 09/11/2012  . Td 09/17/2004  . Tdap 06/01/2015  . Zoster 12/16/2009   Health Maintenance Due  Topic Date Due  . INFLUENZA VACCINE  09/22/2018    Last Pap smear:  Aged out  Last mammogram:10-02-18 Last colonoscopy:10-14-14 Last DEXA: 03-18-14 Dentist: one year ago Ophtho: one year ago Exercise: bike, floor exercise  Other doctors caring for patient include: [facility] cardiology, Dr. Rosana Hoes urology   Advanced directives: on file  Does Patient Have a Medical Advance Directive?: Yes Type of Advance Directive: [place] will Does patient want to make changes to medical advance directive?: No - Patient declined Copy of [place] in Chart?: Yes - validated most recent copy scanned in chart (See row information)  Depression screen:  See questionnaire below.  Depression screen [facility] 2/9 11/20/2018  11/09/2017 11/08/2016 09/15/2015 09/04/2014  Decreased Interest 0 0 0 0 0  Down, Depressed, Hopeless 1 0 0 0 0  PHQ - 2 Score 1 0 0 0 0    Fall Risk Screen: see questionnaire below. Fall Risk  11/20/2018 11/09/2017 11/08/2016 09/15/2015 09/04/2014  Falls in the past year? 0 No Yes Yes Yes  Comment - - passed out- had pacemaker implanted - -  Number falls in past yr: - - 1 1 1   Injury with Fall? - - Yes Yes No  Risk for fall due to : - - - Other (Comment) Impaired balance/gait  Risk for fall due to: Comment - - - food poision fainted  -  Follow up - - - Falls evaluation completed -    ADL screen:  See questionnaire below Functional Status Survey: Is the patient deaf or have difficulty hearing?: No Does the patient have difficulty seeing, even when wearing glasses/contacts?: No Does the patient have difficulty concentrating, remembering, or making decisions?: No Does the patient have difficulty walking or climbing stairs?: Yes(clinbing stairs) Does the patient have difficulty dressing or bathing?: No Does the patient have difficulty doing errands alone such as visiting a doctor's office or shopping?: No   Review of Systems Constitutional: -, -unexpected weight change, -anorexia, -fatigue Allergy: -sneezing, -itching, -congestion Dermatology: denies changing moles, rash, lumps ENT: -runny nose, -ear pain, -sore throat,  Cardiology:  -chest pain, -palpitations, -orthopnea, Respiratory: -cough, -shortness of breath, -dyspnea on exertion,  Gastroenterology: -abdominal pain, -nausea, -vomiting, -diarrhea, -constipation, -dysphagia Hematology: -bleeding or bruising problems Musculoskeletal: -arthralgias, -myalgias, -joint swelling, -back pain, - Ophthalmology: -vision changes,  Urology: -dysuria, -difficulty urinating,  -urinary frequency, -urgency, incontinence Neurology: -, -  numbness, , -memory loss, -falls, -dizziness    PHYSICAL EXAM:  BP 122/78 (BP Location: Left Arm, Patient  Position: Sitting)   Pulse 81   Temp 98.2 F (36.8 C)   Ht 5\' 1"  (1.549 m)   Wt 216 lb 6.4 oz (98.2 kg)   SpO2 96%   BMI 40.89 kg/m   General Appearance: Alert, cooperative, no distress, appears stated age Head: Normocephalic, without obvious abnormality, atraumatic Eyes: PERRL, conjunctiva/corneas clear, EOM's intact,  Ears: Normal TM's and external ear canals Nose: Nares normal, mucosa normal, no drainage or sinus tenderness Throat: Lips, mucosa, and tongue normal; teeth and gums normal Neck: Supple, no lymphadenopathy;  thyroid:  no enlargement/tenderness/nodules; no carotid bruit or JVD Lungs: Clear to auscultation bilaterally without wheezes, rales or ronchi; respirations unlabored Heart: Regular rate and rhythm, S1 and S2 normal, no murmur, rubor gallop Abdomen: Soft, non-tender, nondistended, normoactive bowel sounds,  no masses, no hepatosplenomegaly Extremities: No clubbing, cyanosis or edema Pulses: 2+ and symmetric all extremities Skin:  Skin color, texture, turgor normal, no rashes or lesions Lymph nodes: Cervical, supraclavicular, and axillary nodes normal Neurologic:  CNII-XII intact, normal strength, sensation and gait; reflexes 2+ and symmetric throughout Psych: Normal mood, affect, hygiene and grooming.  ASSESSMENT/PLAN: Essential hypertension - Plan: CBC with Differential/Platelet, Comprehensive metabolic panel  Obesity, Class III, BMI 40-49.9 (morbid obesity) ([place]) - Plan: Amb Ref to Medical Weight Management  Gastroesophageal reflux disease without esophagitis  Hyperlipidemia, unspecified hyperlipidemia type - Plan: Lipid panel, simvastatin (ZOCOR) 20 MG tablet  Seasonal allergic rhinitis due to pollen  Mild intermittent asthma without complication  Arthritis  Status post total replacement of right hip  Pacemaker S/P [employer] MRI model [id number]  Calcium oxalate renal stones  Renal cyst Encouraged her to continue with her exercise and  will refer to [facility].  Explained that it could be several months before they call to help her with her dietary modification as she does seem to be keeping herself very busy physically. She will continue on her present medications which seem to be working well for her. Also discussed allergy therapy as well as asthma.  Gave her information concerning proper use of the inhaler and when to potentially get on a more aggressive therapy regimen.  Also discussed follow-up on colon cancer screening and mammogram screening.     Immunization recommendations discussed.  Colonoscopy recommendations reviewed   Medicare Attestation I have personally reviewed: The patient's medical and social history Their use of alcohol, tobacco or illicit drugs Their current medications and supplements The patient's functional ability including ADLs,fall risks, home safety risks, cognitive, and hearing and visual impairment Diet and physical activities Evidence for depression or mood disorders  The patient's weight, height, and BMI have been recorded in the chart.  I have made referrals, counseling, and provided education to the patient based on review of the above and I have provided the patient with a written personalized care plan for preventive services.     Jill Alexanders, MD   11/20/2018

## 2018-11-20 NOTE — Patient Instructions (Signed)
  Margaret Reese , Thank you for taking time to come for your Medicare Wellness Visit. I appreciate your ongoing commitment to your health goals. Please review the following plan we discussed and let me know if I can assist you in the future.   These are the goals we discussed: Treat your allergies like you normally do and if you have asthma attack you can take 2 puffs 4 times per day if you need it.  If you have to use your inhaler more than twice a week during the day or twice a month at night I need to know This is a list of the screening recommended for you and due dates:  Health Maintenance  Topic Date Due  . Flu Shot  09/22/2018  . Mammogram  10/01/2020  . Colon Cancer Screening  10/13/2024  . Tetanus Vaccine  05/31/2025  . DEXA scan (bone density measurement)  Completed  .  Hepatitis C: One time screening is recommended by Center for Disease Control  (CDC) for  adults born from [birth year] through 1966.   Completed  . Pneumonia vaccines  Completed

## 2018-11-21 LAB — CBC WITH DIFFERENTIAL/PLATELET
Basophils Absolute: 0.1 10*3/uL (ref 0.0–0.2)
Basos: 1 %
EOS (ABSOLUTE): 0.2 10*3/uL (ref 0.0–0.4)
Eos: 4 %
Hematocrit: 39.7 % (ref 34.0–46.6)
Hemoglobin: 12.7 g/dL (ref 11.1–15.9)
Immature Grans (Abs): 0 10*3/uL (ref 0.0–0.1)
Immature Granulocytes: 0 %
Lymphocytes Absolute: 2.1 10*3/uL (ref 0.7–3.1)
Lymphs: 37 %
MCH: 28.9 pg (ref 26.6–33.0)
MCHC: 32 g/dL (ref 31.5–35.7)
MCV: 90 fL (ref 79–97)
Monocytes Absolute: 0.3 10*3/uL (ref 0.1–0.9)
Monocytes: 6 %
Neutrophils Absolute: 3.1 10*3/uL (ref 1.4–7.0)
Neutrophils: 52 %
Platelets: 201 10*3/uL (ref 150–450)
RBC: 4.4 x10E6/uL (ref 3.77–5.28)
RDW: 14.3 % (ref 11.7–15.4)
WBC: 5.8 10*3/uL (ref 3.4–10.8)

## 2018-11-21 LAB — COMPREHENSIVE METABOLIC PANEL
ALT: 14 IU/L (ref 0–32)
AST: 22 IU/L (ref 0–40)
Albumin/Globulin Ratio: 1.9 (ref 1.2–2.2)
Albumin: 4.6 g/dL (ref 3.7–4.7)
Alkaline Phosphatase: 135 IU/L — ABNORMAL HIGH (ref 39–117)
BUN/Creatinine Ratio: 20 (ref 12–28)
BUN: 19 mg/dL (ref 8–27)
Bilirubin Total: 0.9 mg/dL (ref 0.0–1.2)
CO2: 28 mmol/L (ref 20–29)
Calcium: 10.3 mg/dL (ref 8.7–10.3)
Chloride: 101 mmol/L (ref 96–106)
Creatinine, Ser: 0.97 mg/dL (ref 0.57–1.00)
GFR calc Af Amer: 67 mL/min/{1.73_m2} (ref >59)
GFR calc non Af Amer: 58 mL/min/{1.73_m2} — ABNORMAL LOW (ref >59)
Globulin, Total: 2.4 g/dL (ref 1.5–4.5)
Glucose: 96 mg/dL (ref 65–99)
Potassium: 4.1 mmol/L (ref 3.5–5.2)
Sodium: 142 mmol/L (ref 134–144)
Total Protein: 7 g/dL (ref 6.0–8.5)

## 2018-11-21 LAB — LIPID PANEL
Chol/HDL Ratio: 2.9 ratio (ref 0.0–4.4)
Cholesterol, Total: 156 mg/dL (ref 100–199)
HDL: 53 mg/dL (ref >39)
LDL Chol Calc (NIH): 84 mg/dL (ref 0–99)
Triglycerides: 104 mg/dL (ref 0–149)
VLDL Cholesterol Cal: 19 mg/dL (ref 5–40)

## 2018-12-16 ENCOUNTER — Encounter: Payer: Self-pay | Admitting: Cardiology

## 2018-12-16 DIAGNOSIS — I495 Sick sinus syndrome: Secondary | ICD-10-CM

## 2018-12-16 HISTORY — DX: Sick sinus syndrome: I49.5

## 2018-12-26 ENCOUNTER — Other Ambulatory Visit: Payer: Self-pay

## 2018-12-26 DIAGNOSIS — I10 Essential (primary) hypertension: Secondary | ICD-10-CM

## 2018-12-26 MED ORDER — METOPROLOL SUCCINATE ER 50 MG PO TB24: 50.0000 mg | ORAL_TABLET | Freq: Every day | ORAL | 1 refills | Status: DC

## 2019-01-07 ENCOUNTER — Other Ambulatory Visit: Payer: Self-pay

## 2019-01-07 MED ORDER — OLMESARTAN MEDOXOMIL-HCTZ 40-12.5 MG PO TABS: 1.0000 | ORAL_TABLET | Freq: Every day | ORAL | 1 refills | Status: DC

## 2019-01-11 ENCOUNTER — Other Ambulatory Visit: Payer: Self-pay

## 2019-01-11 ENCOUNTER — Encounter: Payer: Self-pay | Admitting: Cardiology

## 2019-01-11 ENCOUNTER — Ambulatory Visit (INDEPENDENT_AMBULATORY_CARE_PROVIDER_SITE_OTHER): Payer: Medicare Other | Admitting: Cardiology

## 2019-01-11 VITALS — BP 133/62 | HR 77 | Temp 97.2°F | Ht 61.0 in | Wt 214.1 lb

## 2019-01-11 DIAGNOSIS — R0609 Other forms of dyspnea: Secondary | ICD-10-CM

## 2019-01-11 DIAGNOSIS — I10 Essential (primary) hypertension: Secondary | ICD-10-CM | POA: Diagnosis not present

## 2019-01-11 DIAGNOSIS — I5032 Chronic diastolic (congestive) heart failure: Secondary | ICD-10-CM | POA: Diagnosis not present

## 2019-01-11 DIAGNOSIS — R0600 Dyspnea, unspecified: Secondary | ICD-10-CM

## 2019-01-11 NOTE — Progress Notes (Signed)
Primary Physician/Referring:  Denita Lung, MD  Patient ID: Margaret Reese, female    DOB: Apr 06, 1945, 73 y.o.   MRN: [id number]  Subjective   Chief Complaint  Patient presents with  . Hypertension  . Follow-up    13mo   HPI: Margaret Reese  is a 73 y.o. female   Caucasian female with history of syncope and bradycardia and symptomatic with 2 AV block status post permanent pacemaker implantation on 05/20/2016 by Dr. Curt Bears, recently seen for pacemaker check and noted to have tachycardia and elevated blood pressure.  Lisinopril was changed to olmesartan hydrochlorothiazide at her last visit.  She now presents for blood pressure follow-up.  She has not been monitoring her blood pressure, but blood pressure was well controlled at her PCP office.  She reports some nausea at first with taking olmesartan, but has since improved.  Dizziness with metoprolol has also continued to improve.  She has chronic mild dyspnea.  She mostly notices this with going down stairs or walking downhill.  She is started walking more to see if this will help.  No chest pain or leg edema. Past medical history includes; HTN, asthma, gastric ulcers, and GERD.  She does report some burping.  Past Medical History:  Diagnosis Date  . Allergy   . Arthritis   . Asthma   . Complication of anesthesia    pt states has difficulty awakening; also has increased sinus drainage  . Encounter for interrogation of cardiac pacemaker 09/05/2018  . Falls   . GERD (gastroesophageal reflux disease)   . H/O back injury   . History of bronchitis   . History of colon polyps   . Hypertension   . Imbalance   . Kidney cysts    pt states not sure which kidney does see kidney specialist yearly pt states every thing okay currently   . Legally blind in right eye, as defined in [place]   . Mobitz type II atrioventricular block 11/08/2016  . Multiple gastric ulcers   . Obesity   . Pacemaker S/P [place] MRI model [id number]  05/20/2016  . Renal stone   . Retinal vein occlusion   . Sinus node dysfunction ([place]) 12/16/2018  . Tingling    left arm   . Trace cataracts   . Urinary frequency     Past Surgical History:  Procedure Laterality Date  . CARDIOVASCULAR STRESS TEST  dec 2016  . CHOLECYSTECTOMY    . COLONOSCOPY  2011  . EYE SURGERY Bilateral    Cataract surgery.   Marland Kitchen LEFT HEART CATH AND CORONARY ANGIOGRAPHY N/A 05/19/2016   Procedure: Left Heart Cath and Coronary Angiography;  Surgeon: Dixie Dials, MD;  Location: [facility];  Service: Cardiovascular;  Laterality: N/A;  . PACEMAKER IMPLANT N/A 05/20/2016   Procedure: Pacemaker Implant;  Surgeon: Will Meredith Leeds, MD;  Location: [facility];  Service: Cardiovascular;  Laterality: N/A;  . TONSILLECTOMY    . TOTAL HIP ARTHROPLASTY Right 01/30/2015   Procedure: RIGHT TOTAL HIP ARTHROPLASTY ANTERIOR APPROACH AND REMOVAL LIPOMA RIGHT HIP;  Surgeon: Mcarthur Rossetti, MD;  Location: [facility];  Service: Orthopedics;  Laterality: Right;  . UPPER GI ENDOSCOPY      Social History   Socioeconomic History  . Marital status: Married    Spouse name: Not on file  . Number of children: 2  . Years of education: Not on file  . Highest education level: Not on file  Occupational  History  . Not on file  Social Needs  . Financial resource strain: Not on file  . Food insecurity    Worry: Not on file    Inability: Not on file  . Transportation needs    Medical: Not on file    Non-medical: Not on file  Tobacco Use  . Smoking status: Never Smoker  . Smokeless tobacco: Never Used  Substance and Sexual Activity  . Alcohol use: No  . Drug use: No  . Sexual activity: Yes  Lifestyle  . Physical activity    Days per week: Not on file    Minutes per session: Not on file  . Stress: Not on file  Relationships  . Social Herbalist on phone: Not on file    Gets together: Not on file    Attends religious service: Not on file    Active  member of club or organization: Not on file    Attends meetings of clubs or organizations: Not on file    Relationship status: Not on file  . Intimate partner violence    Fear of current or ex partner: Not on file    Emotionally abused: Not on file    Physically abused: Not on file    Forced sexual activity: Not on file  Other Topics Concern  . Not on file  Social History Narrative  . Not on file   Review of Systems  Constitution: Negative for chills, decreased appetite, malaise/fatigue and weight gain.  Cardiovascular: Positive for dyspnea on exertion (mild). Negative for chest pain, leg swelling, palpitations and syncope.  Endocrine: Negative for cold intolerance.  Hematologic/Lymphatic: Does not bruise/bleed easily.  Musculoskeletal: Negative for joint swelling.  Gastrointestinal: Negative for abdominal pain, anorexia, change in bowel habit, hematochezia and melena.  Neurological: Negative for headaches and light-headedness.  Psychiatric/Behavioral: Negative for depression and substance abuse.  All other systems reviewed and are negative.  Objective  Blood pressure 133/62, pulse 77, temperature (!) 97.2 F (36.2 C), height 5\' 1"  (1.549 m), weight 214 lb 1.6 oz (97.1 kg), SpO2 99 %. Body mass index is 40.45 kg/m.   Physical Exam  Constitutional: She appears well-developed. No distress.  Morbidly obese  HENT:  Head: Atraumatic.  Eyes: Conjunctivae are normal.  Neck: Neck supple. No thyromegaly present.  Short neck and difficult to evaluate JVP  Cardiovascular: Normal rate, regular rhythm and normal heart sounds. Exam reveals no gallop.  No murmur heard. Pulses:      Carotid pulses are 2+ on the right side and 2+ on the left side.      Dorsalis pedis pulses are 2+ on the right side and 2+ on the left side.       Posterior tibial pulses are 2+ on the right side and 2+ on the left side.  Femoral and popliteal pulse difficult to feel due to patient's body habitus.    Pulmonary/Chest: Effort normal and breath sounds normal.  Pacemaker/ICD site noted  in the left infraclavicular fossa.    Abdominal: Soft. Bowel sounds are normal.  Obese. Pannus present  Musculoskeletal: Normal range of motion.        General: No edema.  Neurological: She is alert.  Skin: Skin is warm and dry.  Psychiatric: She has a normal mood and affect.   Radiology: No results found.  Laboratory examination:   CMP Latest Ref Rng & Units 11/20/2018 11/09/2017 11/08/2016  Glucose 65 - 99 mg/dL 96 88 90  BUN 8 -  27 mg/dL 19 20 18   Creatinine 0.57 - 1.00 mg/dL 0.97 0.90 0.96(H)  Sodium 134 - 144 mmol/L 142 141 141  Potassium 3.5 - 5.2 mmol/L 4.1 4.1 3.8  Chloride 96 - 106 mmol/L 101 98 100  CO2 20 - 29 mmol/L 28 25 30   Calcium 8.7 - 10.3 mg/dL 10.3 10.8(H) 10.2  Total Protein 6.0 - 8.5 g/dL 7.0 7.1 7.2  Total Bilirubin 0.0 - 1.2 mg/dL 0.9 0.8 0.9  Alkaline Phos 39 - 117 IU/L 135(H) 118(H) -  AST 0 - 40 IU/L 22 26 18   ALT 0 - 32 IU/L 14 16 13    CBC Latest Ref Rng & Units 11/20/2018 11/09/2017 11/08/2016  WBC 3.4 - 10.8 x10E3/uL 5.8 7.1 6.7  Hemoglobin 11.1 - 15.9 g/dL 12.7 13.5 12.7  Hematocrit 34.0 - 46.6 % 39.7 40.2 38.9  Platelets 150 - 450 x10E3/uL 201 232 218   Lipid Panel     Component Value Date/Time   CHOL 156 11/20/2018 0919   TRIG 104 11/20/2018 0919   HDL 53 11/20/2018 0919   CHOLHDL 2.9 11/20/2018 0919   CHOLHDL 2.6 11/08/2016 1035   VLDL 20 09/15/2015 0914   LDLCALC 84 11/20/2018 0919   LDLCALC 75 11/08/2016 1035   HEMOGLOBIN A1C No results found for: HGBA1C, MPG TSH No results for input(s): TSH in the last 8760 hours.  Medications   Current Outpatient Medications  Medication Instructions  . acetaminophen (TYLENOL) 325-650 mg, Oral, Every 4 hours PRN  . Albuterol Sulfate (PROAIR RESPICLICK IN) Inhalation  . aspirin 81 mg, Oral, Daily  . Cholecalciferol (VITAMIN D3) 1000 UNITS CAPS 1 capsule, Oral, Every evening  . fish oil-omega-3 fatty acids 1 g,  Oral, Daily,  Taking 1200mg   . metoprolol succinate (TOPROL-XL) 50 mg, Oral, Daily, Take with or immediately following a meal.  . Multiple Vitamins-Minerals (PRESERVISION AREDS 2) CAPS 1 capsule, Oral, 2 times daily  . olmesartan-hydrochlorothiazide (BENICAR HCT) 40-12.5 MG tablet 1 tablet, Oral, Daily  . potassium citrate (UROCIT-K) 5 MEQ (540 MG) SR tablet 5 mEq, Oral, 2 times daily  . simvastatin (ZOCOR) 20 MG tablet TAKE 1 (ONE)TABLET DAILY IN THE EVENING    Cardiac Studies:   Nuclear stress test [01/23/2015]:  1. The resting electrocardiogram demonstrated normal sinus rhythm, RBBB and no resting arrhythmias. Stress EKG is non-diagnostic for ischemia as it a pharmacologic stress using Lexiscan. Stress symptoms included dyspnea. 2. Myocardial perfusion imaging is normal. Overall left ventricular systolic function was normal without regional wall motion abnormalities. The left ventricular ejection fraction was 78%. This is a normal/low risk study, based on the study, patient can be taken up for the surgery with acceptable CV risk.  Echocardiogram [03/10/2015]:  Patient tachycardic. Left ventricle cavity is normal in size. Moderate concentric hypertrophy of the left ventricle. Doppler evidence of grade III (restrictive) diastolic dysfunction. Visual EF is >70%. Diastolic dysfunction findings suggests elevated LA/LV end diastolic pressure. Calculated EF 63%. Left atrial cavity is normal in size. A patent foramen ovale is probably present by color Doppler evaluation. Right ventricle cavity is mildly dilated. Normal right ventricular function. Mild tricuspid regurgitation. No evidence of pulmonary hypertension.  Coronary Angiogram [05/20/2015]: Normal.  Assessment     ICD-10-CM   1. Essential hypertension  I10   2. Dyspnea on exertion  R06.00   3. Chronic diastolic (congestive) heart failure (HCC)  I50.32   4. Obesity, Class III, BMI 40-49.9 (morbid obesity) ([place])  E66.01    EKG  10/10/2018: Normal sinus rhythm with  V-paced rhythm at 77 bpm, no further analysis due to V-paced rhythm.  In office pacemaker check 09/05/2018:  Presenting NSR. Ap 20%, VP 2%. No mode switches. Normal lead function. Daily HR range 80-90. Activity good. Longevity >8 years. Normal device function.   Remote 4.21.20: Normal function. No AMS episodes. One PMT with no EGM. AP <2%, VP <5%.  Recommendations:   Blood pressure has improved with changing from lisinopril to olmesartan.  She is overall tolerating this well.  Her dizziness and occasional nausea with the medications have continued to improve.  Encouraged her to notify me if she continues to have persistent symptoms.  Symptomatically she is overall doing well.  She does notice some shortness of breath with going down he will or down a flight of stairs.  I suspect her shortness of breath is multifactorial from her weight and also diastolic dysfunction.  She does not appear to be fluid overloaded.  I have encouraged her to continue to work on her weight loss and to start walking more to see if this will improve.  If she continues to have persistent symptoms, may consider further evaluation at that time.  I will see her back in 6 months, but encouraged her to contact me sooner if needed.  Miquel Dunn, MSN, APRN, FNP-C [facility]. [place] Office: [phone] Fax: [phone]

## 2019-02-16 DIAGNOSIS — Z950 Presence of cardiac pacemaker: Secondary | ICD-10-CM | POA: Diagnosis not present

## 2019-02-16 DIAGNOSIS — I441 Atrioventricular block, second degree: Secondary | ICD-10-CM | POA: Diagnosis not present

## 2019-02-16 DIAGNOSIS — Z45018 Encounter for adjustment and management of other part of cardiac pacemaker: Secondary | ICD-10-CM | POA: Diagnosis not present

## 2019-03-18 DIAGNOSIS — N200 Calculus of kidney: Secondary | ICD-10-CM | POA: Diagnosis not present

## 2019-03-18 DIAGNOSIS — N281 Cyst of kidney, acquired: Secondary | ICD-10-CM | POA: Diagnosis not present

## 2019-03-18 DIAGNOSIS — R8290 Unspecified abnormal findings in urine: Secondary | ICD-10-CM | POA: Diagnosis not present

## 2019-03-26 ENCOUNTER — Other Ambulatory Visit (INDEPENDENT_AMBULATORY_CARE_PROVIDER_SITE_OTHER): Payer: Self-pay | Admitting: Family Medicine

## 2019-03-26 ENCOUNTER — Other Ambulatory Visit: Payer: Self-pay

## 2019-03-26 ENCOUNTER — Ambulatory Visit (INDEPENDENT_AMBULATORY_CARE_PROVIDER_SITE_OTHER): Payer: Medicare Other | Admitting: Family Medicine

## 2019-03-26 ENCOUNTER — Encounter (INDEPENDENT_AMBULATORY_CARE_PROVIDER_SITE_OTHER): Payer: Self-pay | Admitting: Family Medicine

## 2019-03-26 VITALS — BP 134/72 | HR 71 | Temp 97.6°F | Ht 61.0 in | Wt 212.0 lb

## 2019-03-26 DIAGNOSIS — R0602 Shortness of breath: Secondary | ICD-10-CM | POA: Diagnosis not present

## 2019-03-26 DIAGNOSIS — G4719 Other hypersomnia: Secondary | ICD-10-CM

## 2019-03-26 DIAGNOSIS — I10 Essential (primary) hypertension: Secondary | ICD-10-CM

## 2019-03-26 DIAGNOSIS — E7849 Other hyperlipidemia: Secondary | ICD-10-CM

## 2019-03-26 DIAGNOSIS — Z6841 Body Mass Index (BMI) 40.0 and over, adult: Secondary | ICD-10-CM

## 2019-03-26 DIAGNOSIS — R600 Localized edema: Secondary | ICD-10-CM | POA: Diagnosis not present

## 2019-03-26 DIAGNOSIS — R5383 Other fatigue: Secondary | ICD-10-CM | POA: Diagnosis not present

## 2019-03-26 DIAGNOSIS — R739 Hyperglycemia, unspecified: Secondary | ICD-10-CM | POA: Diagnosis not present

## 2019-03-26 DIAGNOSIS — E559 Vitamin D deficiency, unspecified: Secondary | ICD-10-CM

## 2019-03-26 DIAGNOSIS — F3289 Other specified depressive episodes: Secondary | ICD-10-CM | POA: Diagnosis not present

## 2019-03-26 DIAGNOSIS — R7989 Other specified abnormal findings of blood chemistry: Secondary | ICD-10-CM | POA: Diagnosis not present

## 2019-03-26 DIAGNOSIS — Z0289 Encounter for other administrative examinations: Secondary | ICD-10-CM

## 2019-03-26 NOTE — Progress Notes (Signed)
Dear Dr. Redmond School,   Thank you for referring Margaret Reese to our clinic. The following note includes my evaluation and treatment recommendations.  Chief Complaint:   OBESITY Margaret Reese (MR# [id number]) is a 74 y.o. female who presents for evaluation and treatment of obesity and related comorbidities. Current BMI is Body mass index is 40.06 kg/m. Margaret Reese has been struggling with her weight for many years and has been unsuccessful in either losing weight, maintaining weight loss, or reaching her healthy weight goal.  Margaret Reese is currently in the action stage of change and ready to dedicate time achieving and maintaining a healthier weight. Margaret Reese is interested in becoming our patient and working on intensive lifestyle modifications including (but not limited to) diet and exercise for weight loss.  Margaret Reese's habits were reviewed today and are as follows: Her family eats meals together, she thinks her family will eat healthier with her, her desired weight loss is 40 pounds, she has been heavy most of her life, she started gaining weight in college, her heaviest weight ever was 307 pounds, she is a picky eater and doesn't like to eat healthier foods, she craves sugar, bread, and meat, she snacks frequently in the evenings, she wakes up frequently in the middle of the night to eat, she frequently makes poor food choices, she frequently eats larger portions than normal, she has binge eating behaviors and she struggles with emotional eating.  Depression Screen Margaret Reese's Food and Mood (modified PHQ-9) score was 13.  Depression screen PHQ 2/9 03/26/2019  Decreased Interest 1  Down, Depressed, Hopeless 1  PHQ - 2 Score 2  Altered sleeping 3  Tired, decreased energy 3  Change in appetite 3  Feeling bad or failure about yourself  1  Trouble concentrating 0  Moving slowly or fidgety/restless 1  Suicidal thoughts 0  PHQ-9 Score 13  Difficult doing work/chores Not difficult at all   Subjective:    1. Other fatigue Margaret Reese admits to daytime somnolence and reports waking up still tired. Patent has a history of symptoms of daytime fatigue and morning fatigue. Margaret Reese generally gets 6 hours of sleep per night, and states that she has poor quality sleep. Snoring is present. Apneic episodes are not present. Epworth Sleepiness Score is 10.  2. Shortness of breath on exertion Margaret Reese notes increasing shortness of breath with exercising and seems to be worsening over time with weight gain. She notes getting out of breath sooner with activity than she used to. This has gotten worse recently. Margaret Reese denies shortness of breath at rest or orthopnea.  3. Essential hypertension Review: taking medications as instructed, no medication side effects noted, no chest pain on exertion, no dyspnea on exertion, no swelling of ankles.  Takes olmesartan-HCTZ 40-12.5 and metoprolol 50 mg daily for blood pressure control.  Also has a pacemaker in place.  She is followed by Cardiology.  BP Readings from Last 3 Encounters:  03/26/19 134/72  01/11/19 133/62  11/20/18 122/78   4. Other hyperlipidemia Margaret Reese has hyperlipidemia and has been trying to improve her cholesterol levels with intensive lifestyle modification including a low saturated fat diet, exercise and weight loss. She denies any chest pain, claudication or myalgias.  She is taking simvastatin.  Lab Results  Component Value Date   ALT 14 11/20/2018   AST 22 11/20/2018   ALKPHOS 135 (H) 11/20/2018   BILITOT 0.9 11/20/2018   Lab Results  Component Value Date   CHOL 156 11/20/2018   HDL  53 11/20/2018   LDLCALC 84 11/20/2018   TRIG 104 11/20/2018   CHOLHDL 2.9 11/20/2018   5. Vitamin D deficiency She is currently taking vit D 1000 IU daily. She denies nausea, vomiting or muscle weakness.  6. Excessive daytime sleepiness Margaret Reese says she gets 6 hours of sleep at night and snoring is present.  Epworth Sleepiness Score is 10.  7. Localized edema Margaret Reese  has 1-2+ edema in her right leg. History of right hip surgery. No trauma. No redness or induration. Also has mild edema left side.   8. Hyperglycemia Margaret Reese has a history of some elevated blood glucose readings without a diagnosis of diabetes. She denies polyphagia.  9. Other depression, with emotional eating Margaret Reese is struggling with emotional eating and using food for comfort to the extent that it is negatively impacting her health. She has been working on behavior modification techniques to help reduce her emotional eating and has been unsuccessful. She shows no sign of suicidal or homicidal ideations. PHQ-9 is 13 today.  Assessment/Plan:   1. Other fatigue [name] does feel that her weight is causing her energy to be lower than it should be. Fatigue may be related to obesity, depression or many other causes. Labs will be ordered, and in the meanwhile, [name] will focus on self care including making healthy food choices, increasing physical activity and focusing on stress reduction.  Orders - EKG 12-Lead - Anemia panel - CBC with Differential/Platelet - T3 - T4, free - TSH  2. Shortness of breath on exertion [name] does feel that she gets out of breath more easily that she used to when she exercises. [name]'s shortness of breath appears to be obesity related and exercise induced. She has agreed to work on weight loss and gradually increase exercise to treat her exercise induced shortness of breath. Will continue to monitor closely.  3. Essential hypertension Margaret Reese is working on healthy weight loss and exercise to improve blood pressure control. We will watch for signs of hypotension as she continues her lifestyle modifications.  Will monitor.  Did not take BB yet.  Heart rate is in the 70s.  Orders - Comprehensive metabolic panel  4. Other hyperlipidemia Cardiovascular risk and specific lipid/LDL goals reviewed.  We discussed several lifestyle modifications today and [name] will continue to  work on diet, exercise and weight loss efforts. Orders and follow up as documented in patient record.   Counseling Intensive lifestyle modifications are the first line treatment for this issue. . Dietary changes: Increase soluble fiber. Decrease simple carbohydrates. . Exercise changes: Moderate to vigorous-intensity aerobic activity 150 minutes per week if tolerated. . Lipid-lowering medications: see documented in medical record.  Orders - Lipid Panel With LDL/HDL Ratio  5. Vitamin D deficiency Low Vitamin D level contributes to fatigue and are associated with obesity, breast, and colon cancer. She agrees to continue to take Vitamin D @1 ,000 IU daily and will follow-up for routine testing of Vitamin D, at least 2-3 times per year to avoid over-replacement.  Orders - VITAMIN D 25 Hydroxy (Vit-D Deficiency, Fractures)  6. Excessive daytime sleepiness Consider sleep study.  7. Localized edema Will check labs.  May need to increase HCTZ based on results.  Orders - B Nat Peptide  8. Hyperglycemia Fasting labs will be obtained and results with be discussed with Margaret Reese in 2 weeks at her follow up visit. In the meanwhile Margaret Reese was started on a lower simple carbohydrate diet and will work on weight loss efforts.  Orders -  Hemoglobin A1c - Insulin, random  9. Other depression, with emotional eating Behavior modification techniques were discussed today to help Margaret Reese deal with her emotional/non-hunger eating behaviors.  Orders and follow up as documented in patient record.   10. Class 3 severe obesity with serious comorbidity and body mass index (BMI) of 40.0 to 44.9 in adult, unspecified obesity type Margaret Reese) Margaret Reese is currently in the action stage of change and her goal is to continue with weight loss efforts. I recommend Margaret Reese begin the structured treatment plan as follows:  She has agreed to the Category 1 Plan.  Exercise goals: No exercise has been prescribed at this time.    Behavioral modification strategies: increasing lean protein intake, decreasing simple carbohydrates, increasing vegetables and increasing water intake.  She was informed of the importance of frequent follow-up visits to maximize her success with intensive lifestyle modifications for her multiple health conditions. She was informed we would discuss her lab results at her next visit unless there is a critical issue that needs to be addressed sooner. Margaret Reese agreed to keep her next visit at the agreed upon time to discuss these results.  Objective:   Blood pressure 134/72, pulse 71, temperature 97.6 F (36.4 C), temperature source Oral, height 5\' 1"  (1.549 m), weight 212 lb (96.2 kg), SpO2 96 %. Body mass index is 40.06 kg/m.  EKG: Normal sinus rhythm, rate 85 bpm.  Indirect Calorimeter completed today shows a VO2 of 130 and a REE of 905.  Her calculated basal metabolic rate is 0000000 thus her basal metabolic rate is worse than expected.  General: Cooperative, alert, well developed, in no acute distress. HEENT: Conjunctivae and lids unremarkable. Cardiovascular: Regular rhythm.  Lungs: Normal work of breathing. Neurologic: No focal deficits.   Lab Results  Component Value Date   CREATININE 0.97 11/20/2018   BUN 19 11/20/2018   NA 142 11/20/2018   K 4.1 11/20/2018   CL 101 11/20/2018   CO2 28 11/20/2018   Lab Results  Component Value Date   ALT 14 11/20/2018   AST 22 11/20/2018   ALKPHOS 135 (H) 11/20/2018   BILITOT 0.9 11/20/2018   No results found for: HGBA1C No results found for: INSULIN Lab Results  Component Value Date   TSH 1.360 05/19/2016   Lab Results  Component Value Date   CHOL 156 11/20/2018   HDL 53 11/20/2018   LDLCALC 84 11/20/2018   TRIG 104 11/20/2018   CHOLHDL 2.9 11/20/2018   Lab Results  Component Value Date   WBC 5.8 11/20/2018   HGB 12.7 11/20/2018   HCT 39.7 11/20/2018   MCV 90 11/20/2018   PLT 201 11/20/2018   Attestation Statements:    This is the patient's first visit at [facility]. The patient's NEW PATIENT PACKET was reviewed at length. Included in the packet: current and past health history, medications, allergies, ROS, gynecologic history (women only), surgical history, family history, social history, weight history, weight loss surgery history (for those that have had weight loss surgery), nutritional evaluation, mood and food questionnaire, PHQ9, Epworth questionnaire, sleep habits questionnaire, patient life and health improvement goals questionnaire. These will all be scanned into the patient's chart under media.   During the visit, I independently reviewed the patient's EKG, bioimpedance scale results, and indirect calorimeter results. I used this information to tailor a meal plan for the patient that will help her to lose weight and will improve her obesity-related conditions going forward. I performed a medically necessary appropriate examination  and/or evaluation. I discussed the assessment and treatment plan with the patient. The patient was provided an opportunity to ask questions and all were answered. The patient agreed with the plan and demonstrated an understanding of the instructions. Labs were ordered at this visit and will be reviewed at the next visit unless more critical results need to be addressed immediately. Clinical information was updated and documented in the EMR.   Time spent on visit including pre-visit chart review and post-visit care was 60 minutes.   I, Water quality scientist, CMA, am acting as Location manager for [employer], DO.  I have reviewed the above documentation for accuracy and completeness, and I agree with the above. [name], DO

## 2019-03-27 LAB — COMPREHENSIVE METABOLIC PANEL
ALT: 13 IU/L (ref 0–32)
AST: 19 IU/L (ref 0–40)
Albumin/Globulin Ratio: 1.6 (ref 1.2–2.2)
Albumin: 4.6 g/dL (ref 3.7–4.7)
Alkaline Phosphatase: 132 IU/L — ABNORMAL HIGH (ref 39–117)
BUN/Creatinine Ratio: 22 (ref 12–28)
BUN: 19 mg/dL (ref 8–27)
Bilirubin Total: 0.8 mg/dL (ref 0.0–1.2)
CO2: 27 mmol/L (ref 20–29)
Calcium: 10.6 mg/dL — ABNORMAL HIGH (ref 8.7–10.3)
Chloride: 99 mmol/L (ref 96–106)
Creatinine, Ser: 0.86 mg/dL (ref 0.57–1.00)
GFR calc Af Amer: 77 mL/min/{1.73_m2} (ref >59)
GFR calc non Af Amer: 67 mL/min/{1.73_m2} (ref >59)
Globulin, Total: 2.9 g/dL (ref 1.5–4.5)
Glucose: 86 mg/dL (ref 65–99)
Potassium: 4 mmol/L (ref 3.5–5.2)
Sodium: 143 mmol/L (ref 134–144)
Total Protein: 7.5 g/dL (ref 6.0–8.5)

## 2019-03-27 LAB — LIPID PANEL WITH LDL/HDL RATIO
Cholesterol, Total: 152 mg/dL (ref 100–199)
HDL: 52 mg/dL (ref >39)
LDL Chol Calc (NIH): 81 mg/dL (ref 0–99)
LDL/HDL Ratio: 1.6 ratio (ref 0.0–3.2)
Triglycerides: 104 mg/dL (ref 0–149)
VLDL Cholesterol Cal: 19 mg/dL (ref 5–40)

## 2019-03-27 LAB — T3: T3, Total: 143 ng/dL (ref 71–180)

## 2019-03-27 LAB — CBC WITH DIFFERENTIAL/PLATELET
Basophils Absolute: 0.1 10*3/uL (ref 0.0–0.2)
Basos: 1 %
EOS (ABSOLUTE): 0.3 10*3/uL (ref 0.0–0.4)
Eos: 4 %
Hemoglobin: 14.4 g/dL (ref 11.1–15.9)
Immature Grans (Abs): 0 10*3/uL (ref 0.0–0.1)
Immature Granulocytes: 0 %
Lymphocytes Absolute: 2.2 10*3/uL (ref 0.7–3.1)
Lymphs: 34 %
MCH: 29 pg (ref 26.6–33.0)
MCHC: 32.7 g/dL (ref 31.5–35.7)
MCV: 89 fL (ref 79–97)
Monocytes Absolute: 0.4 10*3/uL (ref 0.1–0.9)
Monocytes: 6 %
Neutrophils Absolute: 3.7 10*3/uL (ref 1.4–7.0)
Neutrophils: 55 %
Platelets: 243 10*3/uL (ref 150–450)
RBC: 4.96 x10E6/uL (ref 3.77–5.28)
RDW: 13 % (ref 11.7–15.4)
WBC: 6.7 10*3/uL (ref 3.4–10.8)

## 2019-03-27 LAB — BRAIN NATRIURETIC PEPTIDE: BNP: 26.7 pg/mL (ref 0.0–100.0)

## 2019-03-27 LAB — INSULIN, RANDOM: INSULIN: 8.1 u[IU]/mL (ref 2.6–24.9)

## 2019-03-27 LAB — ANEMIA PANEL
Ferritin: 260 ng/mL — ABNORMAL HIGH (ref 15–150)
Folate, Hemolysate: >620 ng/mL
Folate, RBC: >1406 ng/mL (ref >498)
Hematocrit: 44.1 % (ref 34.0–46.6)
Iron Saturation: 21 % (ref 15–55)
Iron: 64 ug/dL (ref 27–139)
Retic Ct Pct: 1.7 % (ref 0.6–2.6)
Total Iron Binding Capacity: 301 ug/dL (ref 250–450)
UIBC: 237 ug/dL (ref 118–369)
Vitamin B-12: 602 pg/mL (ref 232–1245)

## 2019-03-27 LAB — T4, FREE: Free T4: 1.23 ng/dL (ref 0.82–1.77)

## 2019-03-27 LAB — VITAMIN D 25 HYDROXY (VIT D DEFICIENCY, FRACTURES): Vit D, 25-Hydroxy: 31.6 ng/mL (ref 30.0–100.0)

## 2019-03-27 LAB — HEMOGLOBIN A1C
Est. average glucose Bld gHb Est-mCnc: 114 mg/dL
Hgb A1c MFr Bld: 5.6 % (ref 4.8–5.6)

## 2019-03-27 LAB — TSH: TSH: 3.36 u[IU]/mL (ref 0.450–4.500)

## 2019-04-07 ENCOUNTER — Ambulatory Visit: Payer: Medicare Other | Attending: Internal Medicine

## 2019-04-07 DIAGNOSIS — Z23 Encounter for immunization: Secondary | ICD-10-CM

## 2019-04-07 NOTE — Progress Notes (Signed)
   Covid-19 Vaccination Clinic  Name:  BYANCA YOUNGBLOOD    MRN: [id number] DOB: October 18, 1945  04/07/2019  Ms. Massimino was observed post Covid-19 immunization for 15 minutes without incidence. She was provided with Vaccine Information Sheet and instruction to access the V-Safe system.   Ms. Hessing was instructed to call 911 with any severe reactions post vaccine: Marland Kitchen Difficulty breathing  . Swelling of your face and throat  . A fast heartbeat  . A bad rash all over your body  . Dizziness and weakness    Immunizations Administered    Name Date Dose VIS Date Route   Pfizer COVID-19 Vaccine 04/07/2019  8:12 AM 0.3 mL 02/01/2019 Intramuscular   Manufacturer: [place]   Lot: [id number]   [place]: [id number]

## 2019-04-09 ENCOUNTER — Ambulatory Visit (INDEPENDENT_AMBULATORY_CARE_PROVIDER_SITE_OTHER): Payer: Medicare Other | Admitting: Family Medicine

## 2019-04-09 ENCOUNTER — Encounter (INDEPENDENT_AMBULATORY_CARE_PROVIDER_SITE_OTHER): Payer: Self-pay | Admitting: Family Medicine

## 2019-04-09 ENCOUNTER — Other Ambulatory Visit: Payer: Self-pay

## 2019-04-09 VITALS — BP 133/68 | HR 61 | Temp 97.7°F | Ht 61.0 in | Wt 205.0 lb

## 2019-04-09 DIAGNOSIS — E7849 Other hyperlipidemia: Secondary | ICD-10-CM | POA: Diagnosis not present

## 2019-04-09 DIAGNOSIS — Z6838 Body mass index (BMI) 38.0-38.9, adult: Secondary | ICD-10-CM

## 2019-04-09 DIAGNOSIS — F3289 Other specified depressive episodes: Secondary | ICD-10-CM | POA: Diagnosis not present

## 2019-04-09 DIAGNOSIS — I10 Essential (primary) hypertension: Secondary | ICD-10-CM

## 2019-04-09 DIAGNOSIS — R600 Localized edema: Secondary | ICD-10-CM | POA: Diagnosis not present

## 2019-04-09 DIAGNOSIS — R748 Abnormal levels of other serum enzymes: Secondary | ICD-10-CM

## 2019-04-09 DIAGNOSIS — E559 Vitamin D deficiency, unspecified: Secondary | ICD-10-CM

## 2019-04-09 NOTE — Progress Notes (Signed)
Chief Complaint:   OBESITY Margaret Reese is here to discuss her progress with her obesity treatment plan along with follow-up of her obesity related diagnoses. Margaret Reese is on the Category 1 Plan and states she is following her eating plan approximately 95-98% of the time. Margaret Reese states she is doing floor exercises for 60 minutes and using the recumbent bike for 90 minutes 7 times per week.  Today's visit was #: 2 Starting weight: 212 lbs Starting date: 03/26/2019 Today's weight: 205 lbs Today's date: 04/09/2019 Total lbs lost to date: 7 lbs Total lbs lost since last in-office visit: 7 lbs  Interim History: Margaret Reese is doing well.  She says she is tolerating the foods.  The lunch meat is still a struggle for her.  Subjective:   1. Elevated alkaline phosphatase level Margaret Reese has history of cholecystectomy.  Lab Results  Component Value Date   ALKPHOS 132 (H) 03/26/2019   2. Vitamin D deficiency Margaret Reese's Vitamin D level was 31.6 on 03/26/2019. She is currently taking vit D. She denies nausea, vomiting or muscle weakness.  3. Other hyperlipidemia Margaret Reese has hyperlipidemia and has been trying to improve her cholesterol levels with intensive lifestyle modification including a low saturated fat diet, exercise and weight loss. She denies any chest pain, claudication or myalgias.  She is taking Zocor.  Lab Results  Component Value Date   ALT 13 03/26/2019   AST 19 03/26/2019   ALKPHOS 132 (H) 03/26/2019   BILITOT 0.8 03/26/2019   Lab Results  Component Value Date   CHOL 152 03/26/2019   HDL 52 03/26/2019   LDLCALC 81 03/26/2019   TRIG 104 03/26/2019   CHOLHDL 2.9 11/20/2018   4. Essential hypertension Review: taking medications as instructed, no medication side effects noted, no chest pain on exertion, no dyspnea on exertion, no swelling of ankles.  Margaret Reese is taking Benicar and Toprol.  She is followed by Cardiology and has a pacemaker.  BP Readings from Last 3 Encounters:  04/09/19 133/68    03/26/19 134/72  01/11/19 133/62   5. Lower extremity edema, right greater than left. Else has history of right hip surgery.  Edema has improved.  6. Other depression, with emotional eating Margaret Reese is struggling with emotional eating and using food for comfort to the extent that it is negatively impacting her health. She has been working on behavior modification techniques to help reduce her emotional eating and has been unsuccessful. She shows no sign of suicidal or homicidal ideations.  Assessment/Plan:   1. Elevated alkaline phosphatase level Will monitor.  2. Vitamin D deficiency Low Vitamin D level contributes to fatigue and are associated with obesity, breast, and colon cancer. She agrees to continue to take Vitamin D @1 ,000 IU daily and will follow-up for routine testing of Vitamin D, at least 2-3 times per year to avoid over-replacement.  3. Other hyperlipidemia Cardiovascular risk and specific lipid/LDL goals reviewed.  We discussed several lifestyle modifications today and Margaret Reese will continue to work on diet, exercise and weight loss efforts. Orders and follow up as documented in patient record.   Counseling Intensive lifestyle modifications are the first line treatment for this issue. . Dietary changes: Increase soluble fiber. Decrease simple carbohydrates. . Exercise changes: Moderate to vigorous-intensity aerobic activity 150 minutes per week if tolerated. . Lipid-lowering medications: see documented in medical record.  4. Essential hypertension Margaret Reese is working on healthy weight loss and exercise to improve blood pressure control. We will watch for signs of hypotension as  she continues her lifestyle modifications.  5. Lower extremity edema, right greater than left. Will monitor.  6. Other depression, with emotional eating Behavior modification techniques were discussed today to help Margaret Reese deal with her emotional/non-hunger eating behaviors.  Orders and follow up as  documented in patient record.   7. Class 2 severe obesity with serious comorbidity and body mass index (BMI) of 38.0 to 38.9 in adult, unspecified obesity type Margaret Reese is currently in the action stage of change. As such, her goal is to continue with weight loss efforts. She has agreed to the Category 1 Plan.   Exercise goals: As is.  Behavioral modification strategies: increasing lean protein intake and decreasing simple carbohydrates.  Margaret Reese has agreed to follow-up with our clinic in 2 weeks. She was informed of the importance of frequent follow-up visits to maximize her success with intensive lifestyle modifications for her multiple health conditions.   Objective:   Blood pressure 133/68, pulse 61, temperature 97.7 F (36.5 C), temperature source Oral, height 5\' 1"  (1.549 m), weight 205 lb (93 kg), SpO2 95 %. Body mass index is 38.73 kg/m.  General: Cooperative, alert, well developed, in no acute distress. HEENT: Conjunctivae and lids unremarkable. Cardiovascular: Regular rhythm.  Lungs: Normal work of breathing. Neurologic: No focal deficits.   Lab Results  Component Value Date   CREATININE 0.86 03/26/2019   BUN 19 03/26/2019   NA 143 03/26/2019   K 4.0 03/26/2019   CL 99 03/26/2019   CO2 27 03/26/2019   Lab Results  Component Value Date   ALT 13 03/26/2019   AST 19 03/26/2019   ALKPHOS 132 (H) 03/26/2019   BILITOT 0.8 03/26/2019   Lab Results  Component Value Date   HGBA1C 5.6 03/26/2019   Lab Results  Component Value Date   INSULIN 8.1 03/26/2019   Lab Results  Component Value Date   TSH 3.360 03/26/2019   Lab Results  Component Value Date   CHOL 152 03/26/2019   HDL 52 03/26/2019   LDLCALC 81 03/26/2019   TRIG 104 03/26/2019   CHOLHDL 2.9 11/20/2018   Lab Results  Component Value Date   WBC 6.7 03/26/2019   HGB 14.4 03/26/2019   HCT 44.1 03/26/2019   MCV 89 03/26/2019   PLT 243 03/26/2019   Lab Results  Component Value Date   IRON 64  03/26/2019   TIBC 301 03/26/2019   FERRITIN 260 (H) 03/26/2019   Obesity Behavioral Intervention:   Approximately 15 minutes were spent on the discussion below.  ASK: We discussed the diagnosis of obesity with Margaret Reese today and Margaret Reese agreed to give [name] permission to discuss obesity behavioral modification therapy today.  ASSESS: Margaret Reese has the diagnosis of obesity and her BMI today is 38.8. Margaret Reese is in the action stage of change.   ADVISE: Margaret Reese was educated on the multiple health risks of obesity as well as the benefit of weight loss to improve her health. She was advised of the need for long term treatment and the importance of lifestyle modifications to improve her current health and to decrease her risk of future health problems.  AGREE: Multiple dietary modification options and treatment options were discussed and Margaret Reese agreed to follow the recommendations documented in the above note.  ARRANGE: Margaret Reese was educated on the importance of frequent visits to treat obesity as outlined per CMS and USPSTF guidelines and agreed to schedule her next follow up appointment today.  Attestation Statements:   Reviewed by clinician on day of  visit: allergies, medications, problem list, medical history, surgical history, family history, social history, and previous encounter notes.  I, Water quality scientist, CMA, am acting as Location manager for [employer], DO.  I have reviewed the above documentation for accuracy and completeness, and I agree with the above. [name], DO

## 2019-04-22 ENCOUNTER — Ambulatory Visit (INDEPENDENT_AMBULATORY_CARE_PROVIDER_SITE_OTHER): Payer: Medicare Other | Admitting: Family Medicine

## 2019-04-22 ENCOUNTER — Other Ambulatory Visit: Payer: Self-pay

## 2019-04-22 ENCOUNTER — Encounter: Payer: Self-pay | Admitting: Family Medicine

## 2019-04-22 VITALS — BP 134/76 | HR 81 | Temp 96.9°F | Wt 206.6 lb

## 2019-04-22 DIAGNOSIS — N952 Postmenopausal atrophic vaginitis: Secondary | ICD-10-CM | POA: Diagnosis not present

## 2019-04-22 DIAGNOSIS — N939 Abnormal uterine and vaginal bleeding, unspecified: Secondary | ICD-10-CM

## 2019-04-22 DIAGNOSIS — Z87440 Personal history of urinary (tract) infections: Secondary | ICD-10-CM | POA: Diagnosis not present

## 2019-04-22 DIAGNOSIS — R238 Other skin changes: Secondary | ICD-10-CM | POA: Diagnosis not present

## 2019-04-22 LAB — POCT URINALYSIS DIP (PROADVANTAGE DEVICE)
Bilirubin, UA: NEGATIVE
Blood, UA: NEGATIVE
Glucose, UA: NEGATIVE mg/dL
Ketones, POC UA: NEGATIVE mg/dL
Leukocytes, UA: NEGATIVE
Nitrite, UA: NEGATIVE
Protein Ur, POC: NEGATIVE mg/dL
Specific Gravity, Urine: 1.005
Urobilinogen, Ur: 0.2
pH, UA: 6 (ref 5.0–8.0)

## 2019-04-22 NOTE — Progress Notes (Signed)
   Subjective:    Patient ID: Margaret Reese, female    DOB: 11-08-1945, 74 y.o.   MRN: [id number]  HPI She is here for consult concerning several issues.  She does complain of seeing small amounts of blood after sexual activity.  She does admit to having vaginal dryness.  She has not used any lubricants or sex toys during this timeframe.  She has not had any abdominal cramping or frank vaginal bleeding. Also recently she had a UTI and is still having some slight dysuria from that. She also has been involved in a weight loss program which seems to be working.  She does ride a bicycle and has noted some irritation from sitting on the bicycle.   Review of Systems     Objective:   Physical Exam Alert and in no distress.  Exam of the vaginal area shows some atrophy and dryness.  No evidence of trauma or bleeding noted.  Exam of the gluteal crease does show some evidence of recent irritation but no maceration, erythema.       Assessment & Plan:  Vaginal bleeding - Plan: POCT Urinalysis DIP (Proadvantage Device)  Vaginal atrophy  History of UTI  Skin irritation Discussed the vaginal bleeding with her indicating that it is really postmenopausal atrophy.  Discussed use of estrogen creams versus lubricants.  She plans to start with lubricant and if having difficulty, she will call me. Informed her that there is no evidence at the present time of a UTI.  She was comfortable with that. Recommend she back off on riding her bike to let that area quiet down.  She can also use cortisone cream on that area.  She was comfortable with that.

## 2019-04-24 ENCOUNTER — Other Ambulatory Visit: Payer: Self-pay

## 2019-04-24 ENCOUNTER — Ambulatory Visit (INDEPENDENT_AMBULATORY_CARE_PROVIDER_SITE_OTHER): Payer: Medicare Other | Admitting: Family Medicine

## 2019-04-24 ENCOUNTER — Encounter (INDEPENDENT_AMBULATORY_CARE_PROVIDER_SITE_OTHER): Payer: Self-pay | Admitting: Family Medicine

## 2019-04-24 VITALS — BP 113/68 | HR 77 | Temp 97.8°F | Ht 61.0 in | Wt 201.0 lb

## 2019-04-24 DIAGNOSIS — I10 Essential (primary) hypertension: Secondary | ICD-10-CM

## 2019-04-24 DIAGNOSIS — E559 Vitamin D deficiency, unspecified: Secondary | ICD-10-CM | POA: Diagnosis not present

## 2019-04-24 DIAGNOSIS — E7849 Other hyperlipidemia: Secondary | ICD-10-CM

## 2019-04-24 DIAGNOSIS — Z6838 Body mass index (BMI) 38.0-38.9, adult: Secondary | ICD-10-CM | POA: Diagnosis not present

## 2019-04-24 DIAGNOSIS — F3289 Other specified depressive episodes: Secondary | ICD-10-CM | POA: Diagnosis not present

## 2019-04-24 NOTE — Progress Notes (Signed)
Chief Complaint:   OBESITY Margaret Reese is here to discuss her progress with her obesity treatment plan along with follow-up of her obesity related diagnoses. Margaret Reese is on the Category 1 Plan and states she is following her eating plan approximately 98% of the time. Margaret Reese states she is using the recumbent bike and doing floor exercises for 95-105 minutes 7 times per week.  Today's visit was #: 3 Starting weight: 212 lbs Starting date: 03/26/2019 Today's weight: 201 lbs Today's date: 04/24/2019 Total lbs lost to date: 11 lbs Total lbs lost since last in-office visit: 4 lbs  Interim History: Margaret Reese says she is not hungry.  She is down 100 pounds since her highest weight of 307 pounds.  Subjective:   1. Essential hypertension Review: taking medications as instructed, no medication side effects noted, no chest pain on exertion, no dyspnea on exertion, no swelling of ankles.     BP Readings from Last 3 Encounters:  04/24/19 113/68  04/22/19 134/76  04/09/19 133/68   2. Vitamin D deficiency Margaret Reese's Vitamin D level was 31.6 on 03/26/2019. She is currently taking vit D. She denies nausea, vomiting or muscle weakness.  3. Other hyperlipidemia Margaret Reese has hyperlipidemia and has been trying to improve her cholesterol levels with intensive lifestyle modification including a low saturated fat diet, exercise and weight loss. She denies any chest pain, claudication or myalgias.  She is taking Zocor.  Lab Results  Component Value Date   ALT 13 03/26/2019   AST 19 03/26/2019   ALKPHOS 132 (H) 03/26/2019   BILITOT 0.8 03/26/2019   Lab Results  Component Value Date   CHOL 152 03/26/2019   HDL 52 03/26/2019   LDLCALC 81 03/26/2019   TRIG 104 03/26/2019   CHOLHDL 2.9 11/20/2018   4. Other depression, with emotional eating Margaret Reese is struggling with emotional eating and using food for comfort to the extent that it is negatively impacting her health. She has been working on behavior modification  techniques to help reduce her emotional eating and has been minimally successful. She shows no sign of suicidal or homicidal ideations.  Assessment/Plan:   1. Essential hypertension Margaret Reese is working on healthy weight loss and exercise to improve blood pressure control. We will watch for signs of hypotension as she continues her lifestyle modifications.  2. Vitamin D deficiency Low Vitamin D level contributes to fatigue and are associated with obesity, breast, and colon cancer. She agrees to continue to take prescription Vitamin D @50 ,000 IU every week and will follow-up for routine testing of Vitamin D, at least 2-3 times per year to avoid over-replacement.  3. Other hyperlipidemia Cardiovascular risk and specific lipid/LDL goals reviewed.  We discussed several lifestyle modifications today and Margaret Reese will continue to work on diet, exercise and weight loss efforts. Orders and follow up as documented in patient record.   Counseling Intensive lifestyle modifications are the first line treatment for this issue. . Dietary changes: Increase soluble fiber. Decrease simple carbohydrates. . Exercise changes: Moderate to vigorous-intensity aerobic activity 150 minutes per week if tolerated. . Lipid-lowering medications: see documented in medical record.  4. Other depression, with emotional eating Behavior modification techniques were discussed today to help Margaret Reese deal with her emotional/non-hunger eating behaviors.  Orders and follow up as documented in patient record.   5. Class 2 severe obesity with serious comorbidity and body mass index (BMI) of 38.0 to 38.9 in adult, unspecified obesity type Margaret Reese) Margaret Reese is currently in the action stage of change.  As such, her goal is to continue with weight loss efforts. She has agreed to the Category 1 Plan.   Exercise goals: As is.  Behavioral modification strategies: increasing lean protein intake and increasing water intake.  Margaret Reese has agreed to follow-up  with our clinic in 2 weeks. She was informed of the importance of frequent follow-up visits to maximize her success with intensive lifestyle modifications for her multiple health conditions.   Objective:   Blood pressure 113/68, pulse 77, temperature 97.8 F (36.6 C), temperature source Oral, height 5\' 1"  (1.549 m), weight 201 lb (91.2 kg), SpO2 97 %. Body mass index is 37.98 kg/m.  General: Cooperative, alert, well developed, in no acute distress. HEENT: Conjunctivae and lids unremarkable. Cardiovascular: Regular rhythm.  Lungs: Normal work of breathing. Neurologic: No focal deficits.   Lab Results  Component Value Date   CREATININE 0.86 03/26/2019   BUN 19 03/26/2019   NA 143 03/26/2019   K 4.0 03/26/2019   CL 99 03/26/2019   CO2 27 03/26/2019   Lab Results  Component Value Date   ALT 13 03/26/2019   AST 19 03/26/2019   ALKPHOS 132 (H) 03/26/2019   BILITOT 0.8 03/26/2019   Lab Results  Component Value Date   HGBA1C 5.6 03/26/2019   Lab Results  Component Value Date   INSULIN 8.1 03/26/2019   Lab Results  Component Value Date   TSH 3.360 03/26/2019   Lab Results  Component Value Date   CHOL 152 03/26/2019   HDL 52 03/26/2019   LDLCALC 81 03/26/2019   TRIG 104 03/26/2019   CHOLHDL 2.9 11/20/2018   Lab Results  Component Value Date   WBC 6.7 03/26/2019   HGB 14.4 03/26/2019   HCT 44.1 03/26/2019   MCV 89 03/26/2019   PLT 243 03/26/2019   Lab Results  Component Value Date   IRON 64 03/26/2019   TIBC 301 03/26/2019   FERRITIN 260 (H) 03/26/2019   Obesity Behavioral Intervention:   Approximately 15 minutes were spent on the discussion below.  ASK: We discussed the diagnosis of obesity with Margaret Reese today and Margaret Reese agreed to give [name] permission to discuss obesity behavioral modification therapy today.  ASSESS: Margaret Reese has the diagnosis of obesity and her BMI today is 38.0. Margaret Reese is in the action stage of change.   ADVISE: Margaret Reese was educated on the  multiple health risks of obesity as well as the benefit of weight loss to improve her health. She was advised of the need for long term treatment and the importance of lifestyle modifications to improve her current health and to decrease her risk of future health problems.  AGREE: Multiple dietary modification options and treatment options were discussed and Margaret Reese agreed to follow the recommendations documented in the above note.  ARRANGE: Margaret Reese was educated on the importance of frequent visits to treat obesity as outlined per CMS and USPSTF guidelines and agreed to schedule her next follow up appointment today.  Attestation Statements:   Reviewed by clinician on day of visit: allergies, medications, problem list, medical history, surgical history, family history, social history, and previous encounter notes.  I, Water quality scientist, CMA, am acting as Location manager for [employer], DO.  I have reviewed the above documentation for accuracy and completeness, and I agree with the above. [name], DO

## 2019-04-30 ENCOUNTER — Ambulatory Visit: Payer: Medicare Other | Attending: Internal Medicine

## 2019-04-30 DIAGNOSIS — Z23 Encounter for immunization: Secondary | ICD-10-CM | POA: Insufficient documentation

## 2019-04-30 NOTE — Progress Notes (Signed)
   Covid-19 Vaccination Clinic  Name:  Margaret Reese    MRN: [id number] DOB: 08/19/1945  04/30/2019  [name] was observed post Covid-19 immunization for 15 minutes without incident. She was provided with Vaccine Information Sheet and instruction to access the V-Safe system.   [name] was instructed to call 911 with any severe reactions post vaccine: Marland Kitchen Difficulty breathing  . Swelling of face and throat  . A fast heartbeat  . A bad rash all over body  . Dizziness and weakness   Immunizations Administered    Name Date Dose VIS Date Route   Pfizer COVID-19 Vaccine 04/30/2019  8:15 AM 0.3 mL 02/01/2019 Intramuscular   Manufacturer: [place]   Lot: [id number]   [place]: [id number]

## 2019-05-15 ENCOUNTER — Other Ambulatory Visit: Payer: Self-pay

## 2019-05-15 ENCOUNTER — Ambulatory Visit (INDEPENDENT_AMBULATORY_CARE_PROVIDER_SITE_OTHER): Payer: Medicare Other | Admitting: Family Medicine

## 2019-05-15 ENCOUNTER — Encounter (INDEPENDENT_AMBULATORY_CARE_PROVIDER_SITE_OTHER): Payer: Self-pay | Admitting: Family Medicine

## 2019-05-15 VITALS — BP 119/70 | HR 66 | Temp 97.8°F | Ht 61.0 in | Wt 194.0 lb

## 2019-05-15 DIAGNOSIS — E7849 Other hyperlipidemia: Secondary | ICD-10-CM | POA: Diagnosis not present

## 2019-05-15 DIAGNOSIS — E559 Vitamin D deficiency, unspecified: Secondary | ICD-10-CM

## 2019-05-15 DIAGNOSIS — Z6836 Body mass index (BMI) 36.0-36.9, adult: Secondary | ICD-10-CM | POA: Diagnosis not present

## 2019-05-15 DIAGNOSIS — I10 Essential (primary) hypertension: Secondary | ICD-10-CM

## 2019-05-15 NOTE — Progress Notes (Signed)
Chief Complaint:   OBESITY Margaret Reese is here to discuss her progress with her obesity treatment plan along with follow-up of her obesity related diagnoses. Margaret Reese is on the Category 1 Plan and states she is following her eating plan approximately 98% of the time. Margaret Reese states she is doing floor exercises for 30-40 minutes 7 times per week.  Today's visit was #: 4 Starting weight: 212 lbs Starting date: 03/26/2019 Today's weight: 194 lbs Today's date: 05/15/2019 Total lbs lost to date: 18 lbs Total lbs lost since last in-office visit: 7 lbs  Interim History: Margaret Reese is under 200 pounds today!  Her highest weight was 307 pounds and her goal is 150 pounds.  She is still unable to ride a bike due to discomfort.  She feels more energetic now that she is not doing it.   Subjective:   1. Vitamin D deficiency Margaret Reese's Vitamin D level was 31.6 on 03/26/2019. She is currently taking vit D. She denies nausea, vomiting or muscle weakness.  2. Other hyperlipidemia Margaret Reese has hyperlipidemia and has been trying to improve her cholesterol levels with intensive lifestyle modification including a low saturated fat diet, exercise and weight loss. She denies any chest pain, claudication or myalgias.  Lab Results  Component Value Date   ALT 13 03/26/2019   AST 19 03/26/2019   ALKPHOS 132 (H) 03/26/2019   BILITOT 0.8 03/26/2019   Lab Results  Component Value Date   CHOL 152 03/26/2019   HDL 52 03/26/2019   LDLCALC 81 03/26/2019   TRIG 104 03/26/2019   CHOLHDL 2.9 11/20/2018   3. Essential hypertension Review: taking medications as instructed, no medication side effects noted, no chest pain on exertion, no dyspnea on exertion, no swelling of ankles.   BP Readings from Last 3 Encounters:  05/15/19 119/70  04/24/19 113/68  04/22/19 134/76   Assessment/Plan:   1. Vitamin D deficiency Low Vitamin D level contributes to fatigue and are associated with obesity, breast, and colon cancer. She agrees to  continue to take prescription Vitamin D @50 ,000 IU every week and will follow-up for routine testing of Vitamin D, at least 2-3 times per year to avoid over-replacement.  2. Other hyperlipidemia Cardiovascular risk and specific lipid/LDL goals reviewed.  We discussed several lifestyle modifications today and Margaret Reese will continue to work on diet, exercise and weight loss efforts. Orders and follow up as documented in patient record.   Counseling Intensive lifestyle modifications are the first line treatment for this issue. . Dietary changes: Increase soluble fiber. Decrease simple carbohydrates. . Exercise changes: Moderate to vigorous-intensity aerobic activity 150 minutes per week if tolerated. . Lipid-lowering medications: see documented in medical record.  3. Essential hypertension Margaret Reese is working on healthy weight loss and exercise to improve blood pressure control. We will watch for signs of hypotension as she continues her lifestyle modifications.  4. Class 2 severe obesity with serious comorbidity and body mass index (BMI) of 36.0 to 36.9 in adult, unspecified obesity type [facility]) Margaret Reese is currently in the action stage of change. As such, her goal is to continue with weight loss efforts. She has agreed to the Category 1 Plan.   Exercise goals: As is.  Behavioral modification strategies: increasing lean protein intake and increasing water intake.  Margaret Reese has agreed to follow-up with our clinic in 2 weeks. She was informed of the importance of frequent follow-up visits to maximize her success with intensive lifestyle modifications for her multiple health conditions.   Objective:  Blood pressure 119/70, pulse 66, temperature 97.8 F (36.6 C), temperature source Oral, height 5\' 1"  (1.549 m), weight 194 lb (88 kg), SpO2 96 %. Body mass index is 36.66 kg/m.  General: Cooperative, alert, well developed, in no acute distress. HEENT: Conjunctivae and lids unremarkable. Cardiovascular:  Regular rhythm.  Lungs: Normal work of breathing. Neurologic: No focal deficits.   Lab Results  Component Value Date   CREATININE 0.86 03/26/2019   BUN 19 03/26/2019   NA 143 03/26/2019   K 4.0 03/26/2019   CL 99 03/26/2019   CO2 27 03/26/2019   Lab Results  Component Value Date   ALT 13 03/26/2019   AST 19 03/26/2019   ALKPHOS 132 (H) 03/26/2019   BILITOT 0.8 03/26/2019   Lab Results  Component Value Date   HGBA1C 5.6 03/26/2019   Lab Results  Component Value Date   INSULIN 8.1 03/26/2019   Lab Results  Component Value Date   TSH 3.360 03/26/2019   Lab Results  Component Value Date   CHOL 152 03/26/2019   HDL 52 03/26/2019   LDLCALC 81 03/26/2019   TRIG 104 03/26/2019   CHOLHDL 2.9 11/20/2018   Lab Results  Component Value Date   WBC 6.7 03/26/2019   HGB 14.4 03/26/2019   HCT 44.1 03/26/2019   MCV 89 03/26/2019   PLT 243 03/26/2019   Lab Results  Component Value Date   IRON 64 03/26/2019   TIBC 301 03/26/2019   FERRITIN 260 (H) 03/26/2019   Obesity Behavioral Intervention:   Approximately 15 minutes were spent on the discussion below.  ASK: We discussed the diagnosis of obesity with Margaret Reese today and Margaret Reese agreed to give [name] permission to discuss obesity behavioral modification therapy today.  ASSESS: Margaret Reese has the diagnosis of obesity and her BMI today is 36.8. Margaret Reese is in the action stage of change.   ADVISE: Margaret Reese was educated on the multiple health risks of obesity as well as the benefit of weight loss to improve her health. She was advised of the need for long term treatment and the importance of lifestyle modifications to improve her current health and to decrease her risk of future health problems.  AGREE: Multiple dietary modification options and treatment options were discussed and [name] agreed to follow the recommendations documented in the above note.  ARRANGE: Margaret Reese was educated on the importance of frequent visits to treat obesity as  outlined per CMS and USPSTF guidelines and agreed to schedule her next follow up appointment today.  Attestation Statements:   Reviewed by clinician on day of visit: allergies, medications, problem list, medical history, surgical history, family history, social history, and previous encounter notes.  I, Water quality scientist, CMA, am acting as Location manager for [employer], DO.  I have reviewed the above documentation for accuracy and completeness, and I agree with the above. [name], DO

## 2019-05-18 DIAGNOSIS — I441 Atrioventricular block, second degree: Secondary | ICD-10-CM | POA: Diagnosis not present

## 2019-05-18 DIAGNOSIS — Z950 Presence of cardiac pacemaker: Secondary | ICD-10-CM | POA: Diagnosis not present

## 2019-05-18 DIAGNOSIS — Z45018 Encounter for adjustment and management of other part of cardiac pacemaker: Secondary | ICD-10-CM | POA: Diagnosis not present

## 2019-06-05 ENCOUNTER — Ambulatory Visit (INDEPENDENT_AMBULATORY_CARE_PROVIDER_SITE_OTHER): Payer: Medicare Other | Admitting: Family Medicine

## 2019-06-05 ENCOUNTER — Encounter (INDEPENDENT_AMBULATORY_CARE_PROVIDER_SITE_OTHER): Payer: Self-pay | Admitting: Family Medicine

## 2019-06-05 ENCOUNTER — Other Ambulatory Visit: Payer: Self-pay

## 2019-06-05 VITALS — BP 118/73 | HR 74 | Temp 97.5°F | Ht 61.0 in | Wt 189.0 lb

## 2019-06-05 DIAGNOSIS — K6289 Other specified diseases of anus and rectum: Secondary | ICD-10-CM | POA: Diagnosis not present

## 2019-06-05 DIAGNOSIS — E559 Vitamin D deficiency, unspecified: Secondary | ICD-10-CM

## 2019-06-05 DIAGNOSIS — I10 Essential (primary) hypertension: Secondary | ICD-10-CM | POA: Diagnosis not present

## 2019-06-05 DIAGNOSIS — Z6835 Body mass index (BMI) 35.0-35.9, adult: Secondary | ICD-10-CM | POA: Diagnosis not present

## 2019-06-05 DIAGNOSIS — E7849 Other hyperlipidemia: Secondary | ICD-10-CM

## 2019-06-05 NOTE — Progress Notes (Signed)
Chief Complaint:   OBESITY Margaret Reese is here to discuss her progress with her obesity treatment plan along with follow-up of her obesity related diagnoses. Margaret Reese is on the Category 1 Plan and states she is following her eating plan approximately 50% of the time. Margaret Reese states she is riding a bike/doing floor exercises for 15 minutes 6 times per week.  Today's visit was #: 5 Starting weight: 212 lbs Starting date: 03/26/2019 Today's weight: 189 lbs Today's date: 06/05/2019 Total lbs lost to date: 23 lbs Total lbs lost since last in-office visit: 5 lbs  Interim History: Margaret Reese says she has been feeling more energetic and has been doing more exercise and crafts.  She reports that she ate too much during Easter.  Subjective:   1. Vitamin D deficiency Margaret Reese's Vitamin D level was 31.6 on 03/26/2019. She is currently taking OTC vitamin D 1000 each day. She denies nausea, vomiting or muscle weakness.  2. Essential hypertension Review: taking medications as instructed, no medication side effects noted, no chest pain on exertion, no dyspnea on exertion, no swelling of ankles. She is taking metoprolol and olmesartan-HCTZ for blood pressure.  BP Readings from Last 3 Encounters:  06/05/19 118/73  05/15/19 119/70  04/24/19 113/68   3. Other hyperlipidemia Margaret Reese has hyperlipidemia and has been trying to improve her cholesterol levels with intensive lifestyle modification including a low saturated fat diet, exercise and weight loss. She denies any chest pain, claudication or myalgias.  She takes simvastatin.  Lab Results  Component Value Date   ALT 13 03/26/2019   AST 19 03/26/2019   ALKPHOS 132 (H) 03/26/2019   BILITOT 0.8 03/26/2019   Lab Results  Component Value Date   CHOL 152 03/26/2019   HDL 52 03/26/2019   LDLCALC 81 03/26/2019   TRIG 104 03/26/2019   CHOLHDL 2.9 11/20/2018   4. Anal or rectal pain, with constipation and fissure Margaret Reese is complaining of perirectal pain along with  constipation.  Assessment/Plan:   1. Vitamin D deficiency Low Vitamin D level contributes to fatigue and are associated with obesity, breast, and colon cancer. She agrees to continue to take OTC Vitamin D @1 ,000 IU daily and will follow-up for routine testing of Vitamin D, at least 2-3 times per year to avoid over-replacement.  2. Essential hypertension Margaret Reese is working on healthy weight loss and exercise to improve blood pressure control. We will watch for signs of hypotension as she continues her lifestyle modifications.  3. Other hyperlipidemia Cardiovascular risk and specific lipid/LDL goals reviewed.  We discussed several lifestyle modifications today and Margaret Reese will continue to work on diet, exercise and weight loss efforts. Orders and follow up as documented in patient record.   Counseling Intensive lifestyle modifications are the first line treatment for this issue. . Dietary changes: Increase soluble fiber. Decrease simple carbohydrates. . Exercise changes: Moderate to vigorous-intensity aerobic activity 150 minutes per week if tolerated. . Lipid-lowering medications: see documented in medical record.  4. Anal or rectal pain, with constipation and fissure Reviewed bowel regimen and topical treatment.  Discussed precautions and red flags.  5. Class 2 severe obesity with serious comorbidity and body mass index (BMI) of 35.0 to 35.9 in adult, unspecified obesity type Margaret Reese) Margaret Reese is currently in the action stage of change. As such, her goal is to continue with weight loss efforts. She has agreed to the Category 1 Plan.   Exercise goals: As is.  Behavioral modification strategies: increasing lean protein intake and increasing Margaret  intake.  Margaret Reese has agreed to follow-up with our clinic in 2 weeks. She was informed of the importance of frequent follow-up visits to maximize her success with intensive lifestyle modifications for her multiple health conditions.   Objective:   Blood  pressure 118/73, pulse 74, temperature (!) 97.5 F (36.4 C), temperature source Oral, height 5\' 1"  (1.549 m), weight 189 lb (85.7 kg), SpO2 97 %. Body mass index is 35.71 kg/m.  General: Cooperative, alert, well developed, in no acute distress. HEENT: Conjunctivae and lids unremarkable. Cardiovascular: Regular rhythm.  Lungs: Normal work of breathing. Neurologic: No focal deficits.   Lab Results  Component Value Date   CREATININE 0.86 03/26/2019   BUN 19 03/26/2019   NA 143 03/26/2019   K 4.0 03/26/2019   CL 99 03/26/2019   CO2 27 03/26/2019   Lab Results  Component Value Date   ALT 13 03/26/2019   AST 19 03/26/2019   ALKPHOS 132 (H) 03/26/2019   BILITOT 0.8 03/26/2019   Lab Results  Component Value Date   HGBA1C 5.6 03/26/2019   Lab Results  Component Value Date   INSULIN 8.1 03/26/2019   Lab Results  Component Value Date   TSH 3.360 03/26/2019   Lab Results  Component Value Date   CHOL 152 03/26/2019   HDL 52 03/26/2019   LDLCALC 81 03/26/2019   TRIG 104 03/26/2019   CHOLHDL 2.9 11/20/2018   Lab Results  Component Value Date   WBC 6.7 03/26/2019   HGB 14.4 03/26/2019   HCT 44.1 03/26/2019   MCV 89 03/26/2019   PLT 243 03/26/2019   Lab Results  Component Value Date   IRON 64 03/26/2019   TIBC 301 03/26/2019   FERRITIN 260 (H) 03/26/2019   Obesity Behavioral Intervention:   Approximately 15 minutes were spent on the discussion below.  ASK: We discussed the diagnosis of obesity with Shaynah today and Johanne agreed to give [name] permission to discuss obesity behavioral modification therapy today.  ASSESS: Shawntia has the diagnosis of obesity and her BMI today is 35.8. Adaleen is in the action stage of change.   ADVISE: Alegra was educated on the multiple health risks of obesity as well as the benefit of weight loss to improve her health. She was advised of the need for long term treatment and the importance of lifestyle modifications to improve her current  health and to decrease her risk of future health problems.  AGREE: Multiple dietary modification options and treatment options were discussed and Dyan agreed to follow the recommendations documented in the above note.  ARRANGE: [place] was educated on the importance of frequent visits to treat obesity as outlined per CMS and USPSTF guidelines and agreed to schedule her next follow up appointment today.  Attestation Statements:   Reviewed by clinician on day of visit: allergies, medications, problem list, medical history, surgical history, family history, social history, and previous encounter notes.  Margaret Reese, Margaret Reese, CMA, am acting as Location manager for [employer], DO.  Margaret Reese have reviewed the above documentation for accuracy and completeness, and Margaret Reese agree with the above. [name], DO

## 2019-06-18 ENCOUNTER — Other Ambulatory Visit: Payer: Self-pay | Admitting: Cardiology

## 2019-06-18 DIAGNOSIS — I10 Essential (primary) hypertension: Secondary | ICD-10-CM

## 2019-06-20 ENCOUNTER — Other Ambulatory Visit: Payer: Self-pay

## 2019-06-20 ENCOUNTER — Encounter (INDEPENDENT_AMBULATORY_CARE_PROVIDER_SITE_OTHER): Payer: Self-pay | Admitting: Physician Assistant

## 2019-06-20 ENCOUNTER — Ambulatory Visit (INDEPENDENT_AMBULATORY_CARE_PROVIDER_SITE_OTHER): Payer: Medicare Other | Admitting: Physician Assistant

## 2019-06-20 VITALS — BP 105/67 | HR 75 | Temp 98.0°F | Ht 61.0 in | Wt 187.0 lb

## 2019-06-20 DIAGNOSIS — Z6835 Body mass index (BMI) 35.0-35.9, adult: Secondary | ICD-10-CM

## 2019-06-20 DIAGNOSIS — B372 Candidiasis of skin and nail: Secondary | ICD-10-CM | POA: Diagnosis not present

## 2019-06-20 DIAGNOSIS — I10 Essential (primary) hypertension: Secondary | ICD-10-CM | POA: Diagnosis not present

## 2019-06-20 MED ORDER — NYSTATIN 100000 UNIT/GM EX POWD: 1.0000 "application " | Freq: Three times a day (TID) | CUTANEOUS | 0 refills | Status: DC

## 2019-06-20 NOTE — Progress Notes (Signed)
Chief Complaint:   OBESITY Margaret Reese is here to discuss her progress with her obesity treatment plan along with follow-up of her obesity related diagnoses. Margaret Reese is on the Category 1 Plan and states she is following her eating plan approximately 85% of the time. Reality states she is stretching 30 minutes 5 times per week.  Today's visit was #: 6 Starting weight: 212 lbs Starting date: 03/26/2019 Today's weight: 187 lbs Today's date: 06/20/2019 Total lbs lost to date: 25 Total lbs lost since last in-office visit: 2  Interim History: [name] has done very well with the plan. Her hunger has been mostly well controlled. She notes one week where she was hungrier than normal. She normally uses a recumbent bike for exercise, but has had pain and burning in her sacral area, and therefore has taken a break for the last 2 weeks.  Subjective:   Essential hypertension. [name] is on benicar and metoprolol. Blood pressure is controlled today. No chest pain or headache.  BP Readings from Last 3 Encounters:  06/20/19 105/67  06/05/19 118/73  05/15/19 119/70   Lab Results  Component Value Date   CREATININE 0.86 03/26/2019   CREATININE 0.97 11/20/2018   CREATININE 0.90 11/09/2017   Cutaneous candidiasis.  Margaret Reese notes burning and pain around the sacral area.  Assessment/Plan:   Essential hypertension. Ayumi is working on healthy weight loss and exercise to improve blood pressure control. We will watch for signs of hypotension as she continues her lifestyle modifications. She will continue her medications as directed.  Cutaneous candidiasis. Prescription was given for nystatin (MYCOSTATIN/NYSTOP) powder 100,000 units/grams apply to area TID. No recumbent bike for 1-2 weeks. Will use cool water. Will use padded biking shorts when cycling and keep area dry after exercise.  Class 2 severe obesity with serious comorbidity and body mass index (BMI) of 35.0 to 35.9 in adult, unspecified obesity  type ([place]).  Margaret Reese is currently in the action stage of change. As such, her goal is to continue with weight loss efforts. She has agreed to the Category 1 Plan.   Exercise goals: Older adults should follow the adult guidelines. When older adults cannot meet the adult guidelines, they should be as physically active as their abilities and conditions will allow.   Behavioral modification strategies: meal planning and cooking strategies and keeping healthy foods in the home.  Margaret Reese has agreed to follow-up with our clinic in 2 weeks. She was informed of the importance of frequent follow-up visits to maximize her success with intensive lifestyle modifications for her multiple health conditions.   Objective:   Blood pressure 105/67, pulse 75, temperature 98 F (36.7 C), temperature source Oral, height 5\' 1"  (1.549 m), weight 187 lb (84.8 kg), SpO2 97 %. Body mass index is 35.33 kg/m.  General: Cooperative, alert, well developed, in no acute distress. HEENT: Conjunctivae and lids unremarkable. Cardiovascular: Regular rhythm.  Lungs: Normal work of breathing. Neurologic: No focal deficits.  Sacrum: Bilateral sacral area is well circumscribed and erythematous.  Lab Results  Component Value Date   CREATININE 0.86 03/26/2019   BUN 19 03/26/2019   NA 143 03/26/2019   K 4.0 03/26/2019   CL 99 03/26/2019   CO2 27 03/26/2019   Lab Results  Component Value Date   ALT 13 03/26/2019   AST 19 03/26/2019   ALKPHOS 132 (H) 03/26/2019   BILITOT 0.8 03/26/2019   Lab Results  Component Value Date   HGBA1C 5.6 03/26/2019   Lab  Results  Component Value Date   INSULIN 8.1 03/26/2019   Lab Results  Component Value Date   TSH 3.360 03/26/2019   Lab Results  Component Value Date   CHOL 152 03/26/2019   HDL 52 03/26/2019   LDLCALC 81 03/26/2019   TRIG 104 03/26/2019   CHOLHDL 2.9 11/20/2018   Lab Results  Component Value Date   WBC 6.7 03/26/2019   HGB 14.4 03/26/2019   HCT 44.1  03/26/2019   MCV 89 03/26/2019   PLT 243 03/26/2019   Lab Results  Component Value Date   IRON 64 03/26/2019   TIBC 301 03/26/2019   FERRITIN 260 (H) 03/26/2019   Obesity Behavioral Intervention Documentation for Insurance:   Approximately 15 minutes were spent on the discussion below.  ASK: We discussed the diagnosis of obesity with Margaret Reese today and Margaret Reese agreed to give [name] permission to discuss obesity behavioral modification therapy today.  ASSESS: [name] has the diagnosis of obesity and her BMI today is 35.4. Margaret Reese is in the action stage of change.   ADVISE: Margaret Reese was educated on the multiple health risks of obesity as well as the benefit of weight loss to improve her health. She was advised of the need for long term treatment and the importance of lifestyle modifications to improve her current health and to decrease her risk of future health problems.  AGREE: Multiple dietary modification options and treatment options were discussed and Margaret Reese agreed to follow the recommendations documented in the above note.  ARRANGE: Margaret Reese was educated on the importance of frequent visits to treat obesity as outlined per CMS and USPSTF guidelines and agreed to schedule her next follow up appointment today.  Attestation Statements:   Reviewed by clinician on day of visit: allergies, medications, problem list, medical history, surgical history, family history, social history, and previous encounter notes.  IMichaelene Song, am acting as transcriptionist for Abby Potash, PA-C   I have reviewed the above documentation for accuracy and completeness, and I agree with the above. Abby Potash, PA-C

## 2019-06-25 DIAGNOSIS — H04123 Dry eye syndrome of bilateral lacrimal glands: Secondary | ICD-10-CM | POA: Diagnosis not present

## 2019-06-25 DIAGNOSIS — H348312 Tributary (branch) retinal vein occlusion, right eye, stable: Secondary | ICD-10-CM | POA: Diagnosis not present

## 2019-06-25 DIAGNOSIS — H31091 Other chorioretinal scars, right eye: Secondary | ICD-10-CM | POA: Diagnosis not present

## 2019-06-25 DIAGNOSIS — H10413 Chronic giant papillary conjunctivitis, bilateral: Secondary | ICD-10-CM | POA: Diagnosis not present

## 2019-06-25 DIAGNOSIS — Z961 Presence of intraocular lens: Secondary | ICD-10-CM | POA: Diagnosis not present

## 2019-06-25 DIAGNOSIS — H353121 Nonexudative age-related macular degeneration, left eye, early dry stage: Secondary | ICD-10-CM | POA: Diagnosis not present

## 2019-07-03 ENCOUNTER — Other Ambulatory Visit: Payer: Self-pay | Admitting: Cardiology

## 2019-07-04 ENCOUNTER — Ambulatory Visit (INDEPENDENT_AMBULATORY_CARE_PROVIDER_SITE_OTHER): Payer: Medicare Other | Admitting: Physician Assistant

## 2019-07-10 ENCOUNTER — Other Ambulatory Visit: Payer: Self-pay

## 2019-07-10 ENCOUNTER — Encounter (INDEPENDENT_AMBULATORY_CARE_PROVIDER_SITE_OTHER): Payer: Self-pay | Admitting: Family Medicine

## 2019-07-10 ENCOUNTER — Ambulatory Visit (INDEPENDENT_AMBULATORY_CARE_PROVIDER_SITE_OTHER): Payer: Medicare Other | Admitting: Family Medicine

## 2019-07-10 VITALS — BP 108/62 | HR 73 | Temp 98.2°F | Ht 61.0 in | Wt 185.0 lb

## 2019-07-10 DIAGNOSIS — Z6835 Body mass index (BMI) 35.0-35.9, adult: Secondary | ICD-10-CM | POA: Diagnosis not present

## 2019-07-10 DIAGNOSIS — E559 Vitamin D deficiency, unspecified: Secondary | ICD-10-CM | POA: Diagnosis not present

## 2019-07-10 DIAGNOSIS — B372 Candidiasis of skin and nail: Secondary | ICD-10-CM | POA: Diagnosis not present

## 2019-07-10 DIAGNOSIS — I10 Essential (primary) hypertension: Secondary | ICD-10-CM | POA: Diagnosis not present

## 2019-07-10 NOTE — Progress Notes (Signed)
Chief Complaint:   OBESITY Makynleigh is here to discuss her progress with her obesity treatment plan along with follow-up of her obesity related diagnoses. Luv is on the Category 1 Plan and states she is following her eating plan approximately 60% of the time. Niley states she is low impact aerobics for 30 minutes 6 times per week.  Today's visit was #: 7 Starting weight: 212 lbs Starting date: 03/26/2019 Today's weight: 185 lbs Today's date: 07/10/2019 Total lbs lost to date: 27 lbs Total lbs lost since last in-office visit: 2 lbs  Interim History: Niamh says that she is no longer riding her bike due to discomfort.  She has been sleeping in a recliner due to back and right hip pain.  Subjective:   1. Essential hypertension Review: taking medications as instructed, no medication side effects noted, no chest pain on exertion, no dyspnea on exertion, no swelling of ankles.  She will be seeing a new Cardiologist in 2 weeks.  No symptoms of orthostasis.     BP Readings from Last 3 Encounters:  07/10/19 108/62  06/20/19 105/67  06/05/19 118/73   2. Vitamin D deficiency Ziaire's Vitamin D level was 31.6 on 03/26/2019. She is currently taking OTC vitamin D 1000 IU each day. She denies nausea, vomiting or muscle weakness.  3. Cutaneous candidiasis Hajira has cutaneous candidiasis.  Assessment/Plan:   1. Essential hypertension Nahomy is working on healthy weight loss and exercise to improve blood pressure control. We will watch for signs of hypotension as she continues her lifestyle modifications.  Delonna will monitor her blood pressure for the cardiologist.  If low or orthostatic, she will cut her Benicar HCT in half.  2. Vitamin D deficiency Low Vitamin D level contributes to fatigue and are associated with obesity, breast, and colon cancer. She agrees to continue to take prescription Vitamin D @50 ,000 IU every week and will follow-up for routine testing of Vitamin D, at least 2-3 times  per year to avoid over-replacement.  3. Cutaneous candidiasis Will refill nystatin today.  Orders - nystatin (MYCOSTATIN/NYSTOP) powder; Apply 1 application topically 3 (three) times daily.  Dispense: 15 g; Refill: 0  4. Class 2 severe obesity with serious comorbidity and body mass index (BMI) of 35.0 to 35.9 in adult, unspecified obesity type [facility]) Jasminne is currently in the action stage of change. As such, her goal is to continue with weight loss efforts. She has agreed to the Category 1 Plan.   Exercise goals: Older adults should follow the adult guidelines. When older adults cannot meet the adult guidelines, they should be as physically active as their abilities and conditions will allow.  Older adults should do exercises that maintain or improve balance if they are at risk of falling.   Behavioral modification strategies: increasing lean protein intake and increasing water intake.  Yuritzi has agreed to follow-up with our clinic in 2 weeks. She was informed of the importance of frequent follow-up visits to maximize her success with intensive lifestyle modifications for her multiple health conditions.   Objective:   Blood pressure 108/62, pulse 73, temperature 98.2 F (36.8 C), temperature source Oral, height 5\' 1"  (1.549 m), weight 185 lb (83.9 kg), SpO2 96 %. Body mass index is 34.96 kg/m.  General: Cooperative, alert, well developed, in no acute distress. HEENT: Conjunctivae and lids unremarkable. Cardiovascular: Regular rhythm.  Lungs: Normal work of breathing. Neurologic: No focal deficits.   Lab Results  Component Value Date   CREATININE 0.86 03/26/2019  BUN 19 03/26/2019   NA 143 03/26/2019   K 4.0 03/26/2019   CL 99 03/26/2019   CO2 27 03/26/2019   Lab Results  Component Value Date   ALT 13 03/26/2019   AST 19 03/26/2019   ALKPHOS 132 (H) 03/26/2019   BILITOT 0.8 03/26/2019   Lab Results  Component Value Date   HGBA1C 5.6 03/26/2019   Lab Results    Component Value Date   INSULIN 8.1 03/26/2019   Lab Results  Component Value Date   TSH 3.360 03/26/2019   Lab Results  Component Value Date   CHOL 152 03/26/2019   HDL 52 03/26/2019   LDLCALC 81 03/26/2019   TRIG 104 03/26/2019   CHOLHDL 2.9 11/20/2018   Lab Results  Component Value Date   WBC 6.7 03/26/2019   HGB 14.4 03/26/2019   HCT 44.1 03/26/2019   MCV 89 03/26/2019   PLT 243 03/26/2019   Lab Results  Component Value Date   IRON 64 03/26/2019   TIBC 301 03/26/2019   FERRITIN 260 (H) 03/26/2019   Obesity Behavioral Intervention:   Approximately 15 minutes were spent on the discussion below.  ASK: We discussed the diagnosis of obesity with Azrielle today and Phyllistine agreed to give [name] permission to discuss obesity behavioral modification therapy today.  ASSESS: Jacqui has the diagnosis of obesity and her BMI today is 35.0. Tahra is in the action stage of change.   ADVISE: Aubriannah was educated on the multiple health risks of obesity as well as the benefit of weight loss to improve her health. She was advised of the need for long term treatment and the importance of lifestyle modifications to improve her current health and to decrease her risk of future health problems.  AGREE: Multiple dietary modification options and treatment options were discussed and Quantia agreed to follow the recommendations documented in the above note.  ARRANGE: Walter was educated on the importance of frequent visits to treat obesity as outlined per CMS and USPSTF guidelines and agreed to schedule her next follow up appointment today.  Attestation Statements:   Reviewed by clinician on day of visit: allergies, medications, problem list, medical history, surgical history, family history, social history, and previous encounter notes.  I, Water quality scientist, CMA, am acting as Location manager for [employer], DO.  I have reviewed the above documentation for accuracy and completeness, and I agree with  the above. [name], DO

## 2019-07-11 ENCOUNTER — Ambulatory Visit: Payer: Medicare Other | Admitting: Cardiology

## 2019-07-11 MED ORDER — NYSTATIN 100000 UNIT/GM EX POWD: 1.0000 "application " | Freq: Three times a day (TID) | CUTANEOUS | 0 refills | Status: DC

## 2019-07-25 ENCOUNTER — Encounter: Payer: Self-pay | Admitting: Cardiology

## 2019-07-25 ENCOUNTER — Ambulatory Visit: Payer: Medicare Other | Admitting: Cardiology

## 2019-07-25 ENCOUNTER — Other Ambulatory Visit: Payer: Self-pay

## 2019-07-25 VITALS — BP 112/64 | HR 73 | Ht 61.0 in | Wt 188.0 lb

## 2019-07-25 DIAGNOSIS — I441 Atrioventricular block, second degree: Secondary | ICD-10-CM

## 2019-07-25 DIAGNOSIS — Z6835 Body mass index (BMI) 35.0-35.9, adult: Secondary | ICD-10-CM

## 2019-07-25 DIAGNOSIS — I10 Essential (primary) hypertension: Secondary | ICD-10-CM

## 2019-07-25 DIAGNOSIS — E782 Mixed hyperlipidemia: Secondary | ICD-10-CM

## 2019-07-25 DIAGNOSIS — Z950 Presence of cardiac pacemaker: Secondary | ICD-10-CM

## 2019-07-25 MED ORDER — OLMESARTAN MEDOXOMIL 20 MG PO TABS: 20.0000 mg | ORAL_TABLET | Freq: Every day | ORAL | 0 refills | Status: DC

## 2019-07-25 NOTE — Progress Notes (Signed)
Margaret Reese Date of Birth: [birth year] MRN: [id number] Primary Care Provider:Lalonde, Elyse Jarvis, MD Former Cardiology Providers: Dr. Doylene Canard, Dr. Terrence Dupont, Dr. Einar Gip, and Jeri Lager, APRN, FNP-C. Primary Cardiologist: Rex Kras, DO, [facility] (established care 07/25/2019) Electrophysiologist: Dr. Allegra Lai  Date: 07/25/19 Last Visit: 01/11/2019  Chief Complaint  Patient presents with  . Hypertension    pt c/o hypotenstioin    HPI  Margaret Reese is a 74 y.o.  female who presents to the office with a chief complaint of " blood pressure management." Patient's past medical history and cardiovascular risk factors include: Hypertension, secondary AV block status post pacemaker, hyperlipidemia, postmenopausal female, advanced age, obesity.  Since last office visit patient states that she is doing well and has not been hospitalized for cardiovascular symptoms. Patient states that she has lost approximately 40 pounds secondary to lifestyle modifications since January 2021.  She is currently enrolled into wellness center. Patient states that her systolic blood pressures have been trending low and also is symptomatic.  Patient states that her systolic blood pressures at home are usually less than 120 mmHg and at times associated with dizziness.  Patient carries a history of diastolic heart failure as prior echocardiogram noted grade 3 diastolic impairment.  However, patient states that she has never been hospitalized for congestive heart failure.  Most recent remote pacemaker check results reviewed with the patient at today's visit.  FUNCTIONAL STATUS: She does floor exercise for 3minutes a day. She is enrolled in wellness program and has lost 40 pounds since January 2021.    ALLERGIES: No Known Allergies   MEDICATION LIST PRIOR TO VISIT: Current Outpatient Medications on File Prior to Visit  Medication Sig Dispense Refill  . aspirin 81 MG tablet Take 81 mg by mouth daily.    .  Cholecalciferol (VITAMIN D3) 1000 UNITS CAPS Take 1 capsule by mouth every evening.     . fish oil-omega-3 fatty acids 1000 MG capsule Take 1 g by mouth daily.  Taking 1200mg     . magnesium oxide (MAG-OX) 400 MG tablet Take 400 mg by mouth daily.    . metoprolol succinate (TOPROL-XL) 50 MG 24 hr tablet TAKE 1 TABLET BY MOUTH DAILY WITH OR IMMEDIATELY FOLLOWING A MEAL 90 tablet 1  . Multiple Vitamins-Minerals (PRESERVISION AREDS 2) CAPS Take 1 capsule by mouth 2 (two) times daily.     Marland Kitchen nystatin (MYCOSTATIN/NYSTOP) powder Apply 1 application topically 3 (three) times daily. 15 g 0  . potassium citrate (UROCIT-K) 10 MEQ (1080 MG) SR tablet Take 10 mEq by mouth 2 (two) times daily.    . simvastatin (ZOCOR) 20 MG tablet TAKE 1 (ONE)TABLET DAILY IN THE EVENING 90 tablet 3  . acetaminophen (TYLENOL) 325 MG tablet Take 1-2 tablets (325-650 mg total) by mouth every 4 (four) hours as needed for mild pain.    . Albuterol Sulfate (PROAIR RESPICLICK IN) Inhale into the lungs.     No current facility-administered medications on file prior to visit.    PAST MEDICAL HISTORY: Past Medical History:  Diagnosis Date  . Allergy   . Anxiety   . Arthritis   . Asthma   . Complication of anesthesia    pt states has difficulty awakening; also has increased sinus drainage  . Constipation   . Edema, lower extremity   . Encounter for interrogation of cardiac pacemaker 09/05/2018  . Falls   . GERD (gastroesophageal reflux disease)   . H/O back injury   . History of bronchitis   .  History of colon polyps   . Hypertension   . Imbalance   . Kidney cysts    pt states not sure which kidney does see kidney specialist yearly pt states every thing okay currently   . Legally blind in right eye, as defined in [place]   . Mobitz type II atrioventricular block 11/08/2016  . Multiple gastric ulcers   . Obesity   . Pacemaker S/P [place] MRI model [id number] 05/20/2016  . Renal stone   . Retinal vein occlusion     . Shortness of breath   . Sinus node dysfunction ([place]) 12/16/2018  . Tingling    left arm   . Trace cataracts   . Urinary frequency   . Vitamin D deficiency     PAST SURGICAL HISTORY: Past Surgical History:  Procedure Laterality Date  . CARDIOVASCULAR STRESS TEST  dec 2016  . CHOLECYSTECTOMY    . COLONOSCOPY  2011  . EYE SURGERY Bilateral    Cataract surgery.   Marland Kitchen LEFT HEART CATH AND CORONARY ANGIOGRAPHY N/A 05/19/2016   Procedure: Left Heart Cath and Coronary Angiography;  Surgeon: Dixie Dials, MD;  Location: [facility];  Service: Cardiovascular;  Laterality: N/A;  . PACEMAKER IMPLANT N/A 05/20/2016   Procedure: Pacemaker Implant;  Surgeon: Will Meredith Leeds, MD;  Location: [facility];  Service: Cardiovascular;  Laterality: N/A;  . TONSILLECTOMY    . TOTAL HIP ARTHROPLASTY Right 01/30/2015   Procedure: RIGHT TOTAL HIP ARTHROPLASTY ANTERIOR APPROACH AND REMOVAL LIPOMA RIGHT HIP;  Surgeon: Mcarthur Rossetti, MD;  Location: [facility];  Service: Orthopedics;  Laterality: Right;  . UPPER GI ENDOSCOPY      FAMILY HISTORY: The patient's family history includes Asthma in her brother; Bipolar disorder in her mother; Depression in her mother; Eating disorder in her mother; Kidney disease in her mother; Liver disease in her mother; Obesity in her mother; Stroke in her mother.   SOCIAL HISTORY:  The patient  reports that she has never smoked. She has never used smokeless tobacco. She reports that she does not drink alcohol or use drugs.  Review of Systems  Constitution: Negative for chills and fever.  HENT: Negative for hoarse voice and nosebleeds.   Eyes: Negative for discharge, double vision and pain.  Cardiovascular: Negative for chest pain, claudication, dyspnea on exertion, leg swelling, near-syncope, orthopnea, palpitations, paroxysmal nocturnal dyspnea and syncope.  Respiratory: Negative for hemoptysis and shortness of breath.   Musculoskeletal: Negative for  muscle cramps and myalgias.  Gastrointestinal: Negative for abdominal pain, constipation, diarrhea, hematemesis, hematochezia, melena, nausea and vomiting.  Neurological: Negative for dizziness and light-headedness.    PHYSICAL EXAM: Vitals with BMI 07/25/2019 07/10/2019 06/20/2019  Height 5\' 1"  5\' 1"  5\' 1"   Weight 188 lbs 185 lbs 187 lbs  BMI 35.54 [id number]  Systolic [id number]  Diastolic 64 62 67  Pulse 73 73 75    CONSTITUTIONAL: Well-developed and well-nourished. No acute distress.  SKIN: Skin is warm and dry. No rash noted. No cyanosis. No pallor. No jaundice HEAD: Normocephalic and atraumatic.  EYES: No scleral icterus MOUTH/THROAT: Moist oral membranes.  NECK: No JVD present. No thyromegaly noted. LYMPHATIC: No visible cervical adenopathy.  CHEST Normal respiratory effort. No intercostal retractions.  Pacemaker site is clean dry and intact (left infraclavicular region). LUNGS: Clear to auscultation bilaterally. No stridor. No wheezes. No rales.  CARDIOVASCULAR: Regular rate and rhythm, positive S1-S2, no murmurs rubs or gallops appreciated. ABDOMINAL: No apparent ascites.  EXTREMITIES: No peripheral edema  HEMATOLOGIC: No significant bruising NEUROLOGIC: Oriented to person, place, and time. Nonfocal. Normal muscle tone.  PSYCHIATRIC: Normal mood and affect. Normal behavior. Cooperative  CARDIAC DATABASE: Pacemaker in situ: [place] MRI  dual-chamber pacemaker for symptomatic bradycardia in March 2018 Scheduled Remote pacemaker check 06/11/2019:  There was 2 atrial high rate episodes detected. The longest lasted 00:00:00:10 in duration. There was a 1 % cumulative atrial arrhythmia burden. EGM suggests PAC/AT. There were 0 high ventricular rate episodes detected. Health trends do not demonstrate significant abnormality. Battery longevity is 8.5 years. RA pacing is 35.0 %, RV pacing is 99.0 %.  EKG: 10/10/2018: Normal sinus rhythm with V-paced rhythm at 77 bpm,  no further analysis due to V-paced rhythm.  Echocardiogram: 03/10/2015: LVEF 70% with grade 3 diastolic impairment, patent foreman ovale per color Doppler, mildly dilated RV, mild TR.  Stress Testing:  January 23, 2015:Myocardial perfusion imaging is normal. Overall left ventricular systolic function was normal without regional wall motion abnormalities. The left ventricular ejection fraction was 78%. This is a normal/low risk study.  Heart Catheterization: 04/2016 Dr. Dixie Dials: Normal coronaries  LABORATORY DATA: CBC Latest Ref Rng & Units 03/26/2019 11/20/2018 11/09/2017  WBC 3.4 - 10.8 x10E3/uL 6.7 5.8 7.1  Hemoglobin 11.1 - 15.9 g/dL 14.4 12.7 13.5  Hematocrit 34.0 - 46.6 % 44.1 39.7 40.2  Platelets 150 - 450 x10E3/uL 243 201 232    CMP Latest Ref Rng & Units 03/26/2019 11/20/2018 11/09/2017  Glucose 65 - 99 mg/dL 86 96 88  BUN 8 - 27 mg/dL 19 19 20   Creatinine 0.57 - 1.00 mg/dL 0.86 0.97 0.90  Sodium 134 - 144 mmol/L 143 142 141  Potassium 3.5 - 5.2 mmol/L 4.0 4.1 4.1  Chloride 96 - 106 mmol/L 99 101 98  CO2 20 - 29 mmol/L 27 28 25   Calcium 8.7 - 10.3 mg/dL 10.6(H) 10.3 10.8(H)  Total Protein 6.0 - 8.5 g/dL 7.5 7.0 7.1  Total Bilirubin 0.0 - 1.2 mg/dL 0.8 0.9 0.8  Alkaline Phos 39 - 117 IU/L 132(H) 135(H) 118(H)  AST 0 - 40 IU/L 19 22 26   ALT 0 - 32 IU/L 13 14 16     Lipid Panel     Component Value Date/Time   CHOL 152 03/26/2019 1502   TRIG 104 03/26/2019 1502   HDL 52 03/26/2019 1502   CHOLHDL 2.9 11/20/2018 0919   CHOLHDL 2.6 11/08/2016 1035   VLDL 20 09/15/2015 0914   LDLCALC 81 03/26/2019 1502   LDLCALC 75 11/08/2016 1035   LABVLDL 19 03/26/2019 1502    Lab Results  Component Value Date   HGBA1C 5.6 03/26/2019   No components found for: NTPROBNP Lab Results  Component Value Date   TSH 3.360 03/26/2019   TSH 1.360 05/19/2016    Cardiac Panel (last 3 results) No results for input(s): CKTOTAL, CKMB, TROPONINIHS, RELINDX in the last 72  hours.  IMPRESSION:    ICD-10-CM   1. Essential hypertension  I10 olmesartan (BENICAR) 20 MG tablet    Basic metabolic panel    Magnesium  2. Mobitz type 2 second degree AV block  I44.1   3. Pacemaker S/P [employer] MRI model [id number]  Z95.0 EKG 12-Lead  4. Class 2 severe obesity due to excess calories with serious comorbidity and body mass index (BMI) of 35.0 to 35.9 in adult (HCC)  E66.01    Z68.35   5. Chronic diastolic (congestive) heart failure (HCC)  I50.32 PCV  ECHOCARDIOGRAM COMPLETE  6. Mixed hyperlipidemia  E78.2      RECOMMENDATIONS: Margaret Reese is a 74 y.o. female whose past medical history and cardiovascular risk factors include: Hypertension, secondary AV block status post pacemaker, hyperlipidemia, postmenopausal female, advanced age, obesity.  Benign essential hypertension:  Patient's blood pressures have been trending less than 123456 mmHg systolic this is most likely secondary to the fact that she has lost 40 pounds after implementing lifestyle modifications and currently follows up with a wellness center here in town.  We will discontinue olmesartan/hydrochlorothiazide combination.  And will decrease olmesartan to 20 mg p.o. daily.  Blood work in 1 week to evaluate kidney function.  Echocardiogram will be ordered to evaluate for structural heart disease and left ventricular systolic function.  History of Mobitz type II AV block status post pacemaker implantation:  Pacemaker site is well-healed and clean dry and intact.  Most recent pacemaker report reviewed with the patient.  Findings noted above for further reference.  Mixed hyperlipidemia: Continue statin therapy.  Patient does not endorse any myalgias.  Review of her electronic medical records note history of diastolic heart failure.  Patient states that she has not been hospitalized for congestive heart failure in the past.  She does have an echocardiogram which noted grade 3 diastolic  impairment.  We will repeat an echocardiogram to reevaluate the diastolic function as well as for structural heart disease.  FINAL MEDICATION LIST END OF ENCOUNTER: Meds ordered this encounter  Medications  . olmesartan (BENICAR) 20 MG tablet    Sig: Take 1 tablet (20 mg total) by mouth daily.    Dispense:  90 tablet    Refill:  0    Medications Discontinued During This Encounter  Medication Reason  . olmesartan-hydrochlorothiazide (BENICAR HCT) 40-12.5 MG tablet Change in therapy     Current Outpatient Medications:  .  aspirin 81 MG tablet, Take 81 mg by mouth daily., Disp: , Rfl:  .  Cholecalciferol (VITAMIN D3) 1000 UNITS CAPS, Take 1 capsule by mouth every evening. , Disp: , Rfl:  .  fish oil-omega-3 fatty acids 1000 MG capsule, Take 1 g by mouth daily.  Taking 1200mg , Disp: , Rfl:  .  magnesium oxide (MAG-OX) 400 MG tablet, Take 400 mg by mouth daily., Disp: , Rfl:  .  metoprolol succinate (TOPROL-XL) 50 MG 24 hr tablet, TAKE 1 TABLET BY MOUTH DAILY WITH OR IMMEDIATELY FOLLOWING A MEAL, Disp: 90 tablet, Rfl: 1 .  Multiple Vitamins-Minerals (PRESERVISION AREDS 2) CAPS, Take 1 capsule by mouth 2 (two) times daily. , Disp: , Rfl:  .  nystatin (MYCOSTATIN/NYSTOP) powder, Apply 1 application topically 3 (three) times daily., Disp: 15 g, Rfl: 0 .  potassium citrate (UROCIT-K) 10 MEQ (1080 MG) SR tablet, Take 10 mEq by mouth 2 (two) times daily., Disp: , Rfl:  .  simvastatin (ZOCOR) 20 MG tablet, TAKE 1 (ONE)TABLET DAILY IN THE EVENING, Disp: 90 tablet, Rfl: 3 .  acetaminophen (TYLENOL) 325 MG tablet, Take 1-2 tablets (325-650 mg total) by mouth every 4 (four) hours as needed for mild pain., Disp: , Rfl:  .  Albuterol Sulfate (PROAIR RESPICLICK IN), Inhale into the lungs., Disp: , Rfl:  .  olmesartan (BENICAR) 20 MG tablet, Take 1 tablet (20 mg total) by mouth daily., Disp: 90 tablet, Rfl: 0  Orders Placed This Encounter  Procedures  . Basic metabolic panel  . Magnesium  . EKG 12-Lead   . PCV ECHOCARDIOGRAM COMPLETE   --Continue cardiac medications as reconciled  in final medication list. --No follow-ups on file. Or sooner if needed. --Continue follow-up with your primary care physician regarding the management of your other chronic comorbid conditions.  Patient's questions and concerns were addressed to her satisfaction. She voices understanding of the instructions provided during this encounter.   During this visit I reviewed and updated: Tobacco history  allergies medication reconciliation  medical history  surgical history  family history  social history.  This note was created using a voice recognition software as a result there may be grammatical errors inadvertently enclosed that do not reflect the nature of this encounter. Every attempt is made to correct such errors.  Rex Kras, [place], [facility]  Pager: [phone] Office: [phone]

## 2019-08-01 ENCOUNTER — Other Ambulatory Visit: Payer: Self-pay

## 2019-08-01 ENCOUNTER — Ambulatory Visit (INDEPENDENT_AMBULATORY_CARE_PROVIDER_SITE_OTHER): Payer: Medicare Other | Admitting: Physician Assistant

## 2019-08-01 ENCOUNTER — Encounter (INDEPENDENT_AMBULATORY_CARE_PROVIDER_SITE_OTHER): Payer: Self-pay | Admitting: Physician Assistant

## 2019-08-01 VITALS — BP 129/74 | HR 80 | Temp 98.4°F | Ht 61.0 in | Wt 185.0 lb

## 2019-08-01 DIAGNOSIS — E7849 Other hyperlipidemia: Secondary | ICD-10-CM

## 2019-08-01 DIAGNOSIS — E559 Vitamin D deficiency, unspecified: Secondary | ICD-10-CM | POA: Diagnosis not present

## 2019-08-01 DIAGNOSIS — Z6835 Body mass index (BMI) 35.0-35.9, adult: Secondary | ICD-10-CM | POA: Diagnosis not present

## 2019-08-01 DIAGNOSIS — I10 Essential (primary) hypertension: Secondary | ICD-10-CM

## 2019-08-01 NOTE — Progress Notes (Signed)
Chief Complaint:   OBESITY Margaret Reese is here to discuss her progress with her obesity treatment plan along with follow-up of her obesity related diagnoses. Margaret Reese is on the Category 1 Plan and states she is following her eating plan approximately 70% of the time. Margaret Reese states she is doing aerobics 30 minutes 5-7 times per week.  Today's visit was #: 8 Starting weight: 212 lbs Starting date: 03/26/2019 Today's weight: 185 lbs Today's date: 08/01/2019 Total lbs lost to date: 27 Total lbs lost since last in-office visit: 0  Interim History: Margaret Reese reports that she has no problem with breakfast and lunch, but struggles around 4 p.m. and afterwards. She is not eating 6 oz of protein for dinner, but on average is getting around 4 oz.  Subjective:   Other hyperlipidemia. Margaret Reese is on Crestor. No chest pain or myalgias.    Lab Results  Component Value Date   CHOL 152 03/26/2019   HDL 52 03/26/2019   LDLCALC 81 03/26/2019   TRIG 104 03/26/2019   CHOLHDL 2.9 11/20/2018   Lab Results  Component Value Date   ALT 13 03/26/2019   AST 19 03/26/2019   ALKPHOS 132 (H) 03/26/2019   BILITOT 0.8 03/26/2019   The 10-year ASCVD risk score Mikey Bussing DC Jr., et al., 2013) is: 20.8%   Values used to calculate the score:     Age: 74 years     Sex: Female     Is Non-Hispanic African American: No     Diabetic: No     Tobacco smoker: No     Systolic Blood Pressure: 144 mmHg     Is BP treated: Yes     HDL Cholesterol: 52 mg/dL     Total Cholesterol: 152 mg/dL  Vitamin D deficiency. Margaret Reese is on Vitamin 1,000 IU. No nausea, vomiting, or muscle weakness. Last Vitamin D was 31.6 on 03/26/2019.  Assessment/Plan:   Other hyperlipidemia. Cardiovascular risk and specific lipid/LDL goals reviewed.  We discussed several lifestyle modifications today and Margaret Reese will continue to work on diet, exercise and weight loss efforts. Orders and follow up as documented in patient record. Margaret Reese will continue  with Zocor as directed.  Counseling Intensive lifestyle modifications are the first line treatment for this issue.  Dietary changes: Increase soluble fiber. Decrease simple carbohydrates.  Exercise changes: Moderate to vigorous-intensity aerobic activity 150 minutes per week if tolerated.   Lipid-lowering medications: see documented in medical record.  Vitamin D deficiency. Low Vitamin D level contributes to fatigue and are associated with obesity, breast, and colon cancer. She agrees to continue to take Vitamin D and will follow-up for routine testing of Vitamin D, at least 2-3 times per year to avoid over-replacement.  Class 2 severe obesity with serious comorbidity and body mass index (BMI) of 35.0 to 35.9 in adult, unspecified obesity type ([place]).  Margaret Reese is currently in the action stage of change. As such, her goal is to continue with weight loss efforts. She has agreed to the Category 1 Plan.   Exercise goals: Older adults should follow the adult guidelines. When older adults cannot meet the adult guidelines, they should be as physically active as their abilities and conditions will allow.   Behavioral modification strategies: increasing lean protein intake and better snacking choices.  Margaret Reese has agreed to follow-up with our clinic in 3 weeks. She was informed of the importance of frequent follow-up visits to maximize her success with intensive lifestyle modifications for her multiple health conditions.  Objective:   Blood pressure 129/74, pulse 80, temperature 98.4 F (36.9 C), temperature source Oral, height 5\' 1"  (1.549 m), weight 185 lb (83.9 kg), SpO2 96 %. Body mass index is 34.96 kg/m.  General: Cooperative, alert, well developed, in no acute distress. HEENT: Conjunctivae and lids unremarkable. Cardiovascular: Regular rhythm.  Lungs: Normal work of breathing. Neurologic: No focal deficits.   Lab Results  Component Value Date   CREATININE 0.86 03/26/2019   BUN 19  03/26/2019   NA 143 03/26/2019   K 4.0 03/26/2019   CL 99 03/26/2019   CO2 27 03/26/2019   Lab Results  Component Value Date   ALT 13 03/26/2019   AST 19 03/26/2019   ALKPHOS 132 (H) 03/26/2019   BILITOT 0.8 03/26/2019   Lab Results  Component Value Date   HGBA1C 5.6 03/26/2019   Lab Results  Component Value Date   INSULIN 8.1 03/26/2019   Lab Results  Component Value Date   TSH 3.360 03/26/2019   Lab Results  Component Value Date   CHOL 152 03/26/2019   HDL 52 03/26/2019   LDLCALC 81 03/26/2019   TRIG 104 03/26/2019   CHOLHDL 2.9 11/20/2018   Lab Results  Component Value Date   WBC 6.7 03/26/2019   HGB 14.4 03/26/2019   HCT 44.1 03/26/2019   MCV 89 03/26/2019   PLT 243 03/26/2019   Lab Results  Component Value Date   IRON 64 03/26/2019   TIBC 301 03/26/2019   FERRITIN 260 (H) 03/26/2019   Obesity Behavioral Intervention Documentation for Insurance:   Approximately 15 minutes were spent on the discussion below.  ASK: We discussed the diagnosis of obesity with Margaret Reese today and Margaret Reese agreed to give [name] permission to discuss obesity behavioral modification therapy today.  ASSESS: Margaret Reese has the diagnosis of obesity and her BMI today is 35.0. Margaret Reese is in the action stage of change.   ADVISE: Margaret Reese was educated on the multiple health risks of obesity as well as the benefit of weight loss to improve her health. She was advised of the need for long term treatment and the importance of lifestyle modifications to improve her current health and to decrease her risk of future health problems.  AGREE: Multiple dietary modification options and treatment options were discussed and Margaret Reese agreed to follow the recommendations documented in the above note.  ARRANGE: Margaret Reese was educated on the importance of frequent visits to treat obesity as outlined per CMS and USPSTF guidelines and agreed to schedule her next follow up appointment today.  Attestation Statements:    Reviewed by clinician on day of visit: allergies, medications, problem list, medical history, surgical history, family history, social history, and previous encounter notes.  Margaret Reese, am acting as transcriptionist for Abby Potash, PA-C   I have reviewed the above documentation for accuracy and completeness, and I agree with the above. Abby Potash, PA-C

## 2019-08-08 DIAGNOSIS — I10 Essential (primary) hypertension: Secondary | ICD-10-CM | POA: Diagnosis not present

## 2019-08-09 LAB — BASIC METABOLIC PANEL
BUN/Creatinine Ratio: 21 (ref 12–28)
BUN: 18 mg/dL (ref 8–27)
CO2: 24 mmol/L (ref 20–29)
Calcium: 10.1 mg/dL (ref 8.7–10.3)
Chloride: 103 mmol/L (ref 96–106)
Creatinine, Ser: 0.85 mg/dL (ref 0.57–1.00)
GFR calc Af Amer: 78 mL/min/{1.73_m2} (ref >59)
GFR calc non Af Amer: 67 mL/min/{1.73_m2} (ref >59)
Glucose: 80 mg/dL (ref 65–99)
Potassium: 4.2 mmol/L (ref 3.5–5.2)
Sodium: 143 mmol/L (ref 134–144)

## 2019-08-09 LAB — MAGNESIUM: Magnesium: 2.2 mg/dL (ref 1.6–2.3)

## 2019-08-17 DIAGNOSIS — Z950 Presence of cardiac pacemaker: Secondary | ICD-10-CM | POA: Diagnosis not present

## 2019-08-17 DIAGNOSIS — I441 Atrioventricular block, second degree: Secondary | ICD-10-CM | POA: Diagnosis not present

## 2019-08-17 DIAGNOSIS — Z45018 Encounter for adjustment and management of other part of cardiac pacemaker: Secondary | ICD-10-CM | POA: Diagnosis not present

## 2019-08-21 ENCOUNTER — Ambulatory Visit
Admission: RE | Admit: 2019-08-21 | Discharge: 2019-08-21 | Disposition: A | Discharge status: Home / Self Care | Payer: Medicare Other | Source: Ambulatory Visit | Attending: Family Medicine | Admitting: Family Medicine

## 2019-08-21 ENCOUNTER — Encounter: Payer: Self-pay | Admitting: Family Medicine

## 2019-08-21 ENCOUNTER — Ambulatory Visit (INDEPENDENT_AMBULATORY_CARE_PROVIDER_SITE_OTHER): Payer: Medicare Other | Admitting: Family Medicine

## 2019-08-21 ENCOUNTER — Other Ambulatory Visit: Payer: Self-pay

## 2019-08-21 VITALS — BP 140/86 | HR 76 | Temp 98.9°F | Wt 187.4 lb

## 2019-08-21 DIAGNOSIS — M109 Gout, unspecified: Secondary | ICD-10-CM

## 2019-08-21 MED ORDER — COLCHICINE 0.6 MG PO TABS: 0.6000 mg | ORAL_TABLET | Freq: Two times a day (BID) | ORAL | 1 refills | Status: DC

## 2019-08-21 NOTE — Patient Instructions (Addendum)
Take the colchicine twice per day and you can also take 2 Aleve twice per day and also Tylenol. if you are still having trouble next week call me

## 2019-08-21 NOTE — Progress Notes (Signed)
I okay  Subjective:    Patient ID: Margaret Reese, female    DOB: 01-08-1946, 74 y.o.   MRN: [id number]  HPI She states that on Sunday she noted the onset of left wrist tenderness.  She has arthritis and has had difficulty with that for years and does keep her joints very flexible but this was worse than before.  She relates this to a previous episode of gout in her great toe.  She is involved in a weight loss program and has lost several pounds and is very happy about that.   Review of Systems     Objective:   Physical Exam Alert and in no distress.  Exam of the left wrist does show warmth and tenderness to palpation to the wrist.  Full motion of the fingers without pain.  Elbow has no palpable tenderness or pain with motion.  Knees and ankles were also normal.       Assessment & Plan:  Acute gout of left hand, unspecified cause - Plan: DG Hand Complete Left, colchicine 0.6 MG tablet I explained that I thought this clinically was gout and will have her take colchicine as well as Aleve 2 twice per day and also use Tylenol for relief.  She is to return here in about a month for recheck.  Discussed certain foods that can potentially make this worse.

## 2019-08-22 ENCOUNTER — Ambulatory Visit: Payer: Medicare Other

## 2019-08-22 DIAGNOSIS — Z950 Presence of cardiac pacemaker: Secondary | ICD-10-CM

## 2019-08-22 DIAGNOSIS — I10 Essential (primary) hypertension: Secondary | ICD-10-CM

## 2019-08-28 ENCOUNTER — Encounter: Payer: Self-pay | Admitting: Cardiology

## 2019-08-28 ENCOUNTER — Ambulatory Visit: Payer: Medicare Other | Admitting: Cardiology

## 2019-08-28 ENCOUNTER — Other Ambulatory Visit: Payer: Self-pay

## 2019-08-28 ENCOUNTER — Other Ambulatory Visit: Payer: Self-pay | Admitting: Family Medicine

## 2019-08-28 VITALS — BP 144/62 | HR 71 | Resp 16 | Ht 61.0 in | Wt 183.0 lb

## 2019-08-28 DIAGNOSIS — E782 Mixed hyperlipidemia: Secondary | ICD-10-CM

## 2019-08-28 DIAGNOSIS — Z950 Presence of cardiac pacemaker: Secondary | ICD-10-CM

## 2019-08-28 DIAGNOSIS — I441 Atrioventricular block, second degree: Secondary | ICD-10-CM

## 2019-08-28 DIAGNOSIS — M109 Gout, unspecified: Secondary | ICD-10-CM

## 2019-08-28 DIAGNOSIS — I10 Essential (primary) hypertension: Secondary | ICD-10-CM

## 2019-08-28 DIAGNOSIS — Z712 Person consulting for explanation of examination or test findings: Secondary | ICD-10-CM

## 2019-08-28 DIAGNOSIS — Z6834 Body mass index (BMI) 34.0-34.9, adult: Secondary | ICD-10-CM

## 2019-08-28 NOTE — Progress Notes (Signed)
Margaret Reese Date of Birth: 1945-06-03 MRN: [id number] Primary Care Provider:Lalonde, Elyse Jarvis, MD Former Cardiology Providers: Dr. Doylene Canard, Dr. Terrence Dupont, Dr. Einar Gip, and Jeri Lager, APRN, FNP-C. Primary Cardiologist: Rex Kras, DO, [facility] (established care 07/25/2019) Electrophysiologist: Dr. Allegra Lai  Date: 08/29/19 Last Visit: 07/25/2019  Chief Complaint  Patient presents with  . Hypertension  . Follow-up    4 week  . Results    HPI  Margaret Reese is a 74 y.o.  female who presents to the office with a chief complaint of " blood pressure management and review test results." Patient's past medical history and cardiovascular risk factors include: Hypertension, secondary AV block status post pacemaker, hyperlipidemia, postmenopausal female, advanced age, obesity.  Since last office visit patient states that she is doing well and has not been hospitalized for cardiovascular symptoms. Patient states that she has lost approximately 40 pounds secondary to lifestyle modifications since January 2021.  She is currently enrolled into wellness center. At last office visit patient was concerned that her blood pressures are trending low and therefore medications were down titrated.  We discontinued olmesartan/hydrochlorothiazide and represcribed the olmesartan component at a higher dose.  She had repeat blood work 1 week later was noted preserved kidney function and electrolytes.  Patient does not check her blood pressures at home and her office blood pressures are above goal.  Since last office visit she has lost additional 5 pounds with lifestyle modifications and increasing physical activity.  Clinically patient is euvolemic and not in congestive heart failure.  She denies any chest pain at rest or with effort related activities.    Since last office visit patient did have an echocardiogram which notes preserved left ventricular systolic function, grade 1 diastolic impairment, mild MR and  moderate TR with mild pulmonary hypertension with RVSP of 39 mmHg.  Comparing to prior study the pacemaker lead and mild pulmonary hypertension are new findings.  The findings were explained to her in great detail and she verbalized understanding.    FUNCTIONAL STATUS: She does floor exercise for 31minutes a day. She is enrolled in wellness program and has lost 40 pounds since January 2021.    ALLERGIES: No Known Allergies   MEDICATION LIST PRIOR TO VISIT: Current Outpatient Medications on File Prior to Visit  Medication Sig Dispense Refill  . acetaminophen (TYLENOL) 325 MG tablet Take 1-2 tablets (325-650 mg total) by mouth every 4 (four) hours as needed for mild pain.    . Albuterol Sulfate (PROAIR RESPICLICK IN) Inhale into the lungs.     Marland Kitchen aspirin 81 MG tablet Take 81 mg by mouth daily.    . Cholecalciferol (VITAMIN D3) 1000 UNITS CAPS Take 1 capsule by mouth every evening.     . colchicine 0.6 MG tablet Take 1 tablet (0.6 mg total) by mouth 2 (two) times daily. 30 tablet 1  . fish oil-omega-3 fatty acids 1000 MG capsule Take 1 g by mouth daily.  Taking 1200mg     . magnesium oxide (MAG-OX) 400 MG tablet Take 400 mg by mouth daily.    . metoprolol succinate (TOPROL-XL) 50 MG 24 hr tablet TAKE 1 TABLET BY MOUTH DAILY WITH OR IMMEDIATELY FOLLOWING A MEAL 90 tablet 1  . Multiple Vitamins-Minerals (PRESERVISION AREDS 2) CAPS Take 1 capsule by mouth 2 (two) times daily.     Marland Kitchen nystatin (MYCOSTATIN/NYSTOP) powder Apply 1 application topically 3 (three) times daily. 15 g 0  . olmesartan (BENICAR) 20 MG tablet Take 1 tablet (20 mg  total) by mouth daily. 90 tablet 0  . potassium citrate (UROCIT-K) 10 MEQ (1080 MG) SR tablet Take 10 mEq by mouth 2 (two) times daily.    . simvastatin (ZOCOR) 20 MG tablet TAKE 1 (ONE)TABLET DAILY IN THE EVENING 90 tablet 3   No current facility-administered medications on file prior to visit.    PAST MEDICAL HISTORY: Past Medical History:  Diagnosis Date  .  Allergy   . Anxiety   . Arthritis   . Asthma   . Complication of anesthesia    pt states has difficulty awakening; also has increased sinus drainage  . Constipation   . Edema, lower extremity   . Encounter for interrogation of cardiac pacemaker 09/05/2018  . Falls   . GERD (gastroesophageal reflux disease)   . H/O back injury   . History of bronchitis   . History of colon polyps   . Hypertension   . Imbalance   . Kidney cysts    pt states not sure which kidney does see kidney specialist yearly pt states every thing okay currently   . Legally blind in right eye, as defined in [place]   . Mobitz type II atrioventricular block 11/08/2016  . Multiple gastric ulcers   . Obesity   . Pacemaker S/P [place] MRI model [id number] 05/20/2016  . Renal stone   . Retinal vein occlusion   . Shortness of breath   . Sinus node dysfunction ([place]) 12/16/2018  . Tingling    left arm   . Trace cataracts   . Urinary frequency   . Vitamin D deficiency     PAST SURGICAL HISTORY: Past Surgical History:  Procedure Laterality Date  . CARDIOVASCULAR STRESS TEST  dec 2016  . CHOLECYSTECTOMY    . COLONOSCOPY  2011  . EYE SURGERY Bilateral    Cataract surgery.   Marland Kitchen LEFT HEART CATH AND CORONARY ANGIOGRAPHY N/A 05/19/2016   Procedure: Left Heart Cath and Coronary Angiography;  Surgeon: Dixie Dials, MD;  Location: [facility];  Service: Cardiovascular;  Laterality: N/A;  . PACEMAKER IMPLANT N/A 05/20/2016   Procedure: Pacemaker Implant;  Surgeon: Will Meredith Leeds, MD;  Location: [facility];  Service: Cardiovascular;  Laterality: N/A;  . TONSILLECTOMY    . TOTAL HIP ARTHROPLASTY Right 01/30/2015   Procedure: RIGHT TOTAL HIP ARTHROPLASTY ANTERIOR APPROACH AND REMOVAL LIPOMA RIGHT HIP;  Surgeon: Mcarthur Rossetti, MD;  Location: [facility];  Service: Orthopedics;  Laterality: Right;  . UPPER GI ENDOSCOPY      FAMILY HISTORY: The patient's family history includes Asthma in her  brother; Bipolar disorder in her mother; Depression in her mother; Eating disorder in her mother; Kidney disease in her mother; Liver disease in her mother; Obesity in her mother; Stroke in her mother.   SOCIAL HISTORY:  The patient  reports that she has never smoked. She has never used smokeless tobacco. She reports that she does not drink alcohol and does not use drugs.  Review of Systems  Constitutional: Negative for chills and fever.  HENT: Negative for hoarse voice and nosebleeds.   Eyes: Negative for discharge, double vision and pain.  Cardiovascular: Negative for chest pain, claudication, dyspnea on exertion, leg swelling, near-syncope, orthopnea, palpitations, paroxysmal nocturnal dyspnea and syncope.  Respiratory: Negative for hemoptysis and shortness of breath.   Musculoskeletal: Negative for muscle cramps and myalgias.  Gastrointestinal: Negative for abdominal pain, constipation, diarrhea, hematemesis, hematochezia, melena, nausea and vomiting.  Neurological: Negative for dizziness and  light-headedness.    PHYSICAL EXAM: Vitals with BMI 08/28/2019 08/21/2019 08/01/2019  Height 5\' 1"  - 5\' 1"   Weight 183 lbs 187 lbs 6 oz 185 lbs  BMI 34.6 63.01 60.10  Systolic 932 355 732  Diastolic 62 86 74  Pulse 71 76 80    CONSTITUTIONAL: Well-developed and well-nourished. No acute distress.  SKIN: Skin is warm and dry. No rash noted. No cyanosis. No pallor. No jaundice HEAD: Normocephalic and atraumatic.  EYES: No scleral icterus MOUTH/THROAT: Moist oral membranes.  NECK: No JVD present. No thyromegaly noted. LYMPHATIC: No visible cervical adenopathy.  CHEST Normal respiratory effort. No intercostal retractions.  Pacemaker site is clean dry and intact (left infraclavicular region). LUNGS: Clear to auscultation bilaterally. No stridor. No wheezes. No rales.  CARDIOVASCULAR: Regular rate and rhythm, positive S1-S2, no murmurs rubs or gallops appreciated. ABDOMINAL: No apparent ascites.   EXTREMITIES: No peripheral edema  HEMATOLOGIC: No significant bruising NEUROLOGIC: Oriented to person, place, and time. Nonfocal. Normal muscle tone.  PSYCHIATRIC: Normal mood and affect. Normal behavior. Cooperative  CARDIAC DATABASE: Pacemaker in situ: [place] MRI  dual-chamber pacemaker for symptomatic bradycardia in March 2018 Scheduled Remote pacemaker check 06/11/2019:  There was 2 atrial high rate episodes detected. The longest lasted 00:00:00:10 in duration. There was a 1 % cumulative atrial arrhythmia burden. EGM suggests PAC/AT. There were 0 high ventricular rate episodes detected. Health trends do not demonstrate significant abnormality. Battery longevity is 8.5 years. RA pacing is 35.0 %, RV pacing is 99.0 %.  EKG: 10/10/2018: Normal sinus rhythm with V-paced rhythm at 77 bpm, no further analysis due to V-paced rhythm.  Echocardiogram: 08/22/2019: LVEF 55 to 60%, moderate LVH, grade 1 diastolic impairment, elevated LAP, pacemaker/ICD lead noted in the RV, mild MR, moderate TR, mild pulmonary hypertension with RVSP 39 mmHg.  Stress Testing:  January 23, 2015:Myocardial perfusion imaging is normal. Overall left ventricular systolic function was normal without regional wall motion abnormalities. The left ventricular ejection fraction was 78%. This is a normal/low risk study.  Heart Catheterization: 04/2016 Dr. Dixie Dials: Normal coronaries  LABORATORY DATA: CBC Latest Ref Rng & Units 03/26/2019 11/20/2018 11/09/2017  WBC 3.4 - 10.8 x10E3/uL 6.7 5.8 7.1  Hemoglobin 11.1 - 15.9 g/dL 14.4 12.7 13.5  Hematocrit 34.0 - 46.6 % 44.1 39.7 40.2  Platelets 150 - 450 x10E3/uL 243 201 232    CMP Latest Ref Rng & Units 08/08/2019 03/26/2019 11/20/2018  Glucose 65 - 99 mg/dL 80 86 96  BUN 8 - 27 mg/dL 18 19 19   Creatinine 0.57 - 1.00 mg/dL 0.85 0.86 0.97  Sodium 134 - 144 mmol/L 143 143 142  Potassium 3.5 - 5.2 mmol/L 4.2 4.0 4.1  Chloride 96 - 106 mmol/L 103 99 101  CO2  20 - 29 mmol/L 24 27 28   Calcium 8.7 - 10.3 mg/dL 10.1 10.6(H) 10.3  Total Protein 6.0 - 8.5 g/dL - 7.5 7.0  Total Bilirubin 0.0 - 1.2 mg/dL - 0.8 0.9  Alkaline Phos 39 - 117 IU/L - 132(H) 135(H)  AST 0 - 40 IU/L - 19 22  ALT 0 - 32 IU/L - 13 14    Lipid Panel     Component Value Date/Time   CHOL 152 03/26/2019 1502   TRIG 104 03/26/2019 1502   HDL 52 03/26/2019 1502   CHOLHDL 2.9 11/20/2018 0919   CHOLHDL 2.6 11/08/2016 1035   VLDL 20 09/15/2015 0914   LDLCALC 81 03/26/2019 1502   LDLCALC 75 11/08/2016 1035  LABVLDL 19 03/26/2019 1502    Lab Results  Component Value Date   HGBA1C 5.6 03/26/2019   No components found for: NTPROBNP Lab Results  Component Value Date   TSH 3.360 03/26/2019   TSH 1.360 05/19/2016    Cardiac Panel (last 3 results) No results for input(s): CKTOTAL, CKMB, TROPONINIHS, RELINDX in the last 72 hours.  IMPRESSION:    ICD-10-CM   1. Essential hypertension  Q46 Basic metabolic panel    Magnesium    Magnesium    Basic metabolic panel  2. Encounter to discuss test results  Z71.2   3. Mobitz type 2 second degree AV block  I44.1   4. Pacemaker S/P [employer] MRI model [id number]  Z95.0   5. Mixed hyperlipidemia  E78.2   6. Class 1 obesity due to excess calories with serious comorbidity and body mass index (BMI) of 34.0 to 34.9 in adult  E66.09    Z68.34      RECOMMENDATIONS: DEBROAH SHUTTLEWORTH is a 74 y.o. female whose past medical history and cardiovascular risk factors include: Hypertension, secondary AV block status post pacemaker, hyperlipidemia, postmenopausal female, advanced age, obesity.  Benign essential hypertension:  Since the titration of her medications last office visit she is not having episodes of hypotension.  However, her office blood pressures are not well controlled.  Patient will be enrolled into principal care management for ambulatory blood pressure monitoring.    Continue current medical therapy.     Repeat blood work was independently reviewed with her at today's office visit and noted above for further reference.    Echocardiographic findings reviewed with the patient at today's office visit as described above.  In addition, patient's diastolic dysfunction has improved from grade 3 now to grade 1.  Repeat BMP and magnesium prior to the next office visit.  Labs ordered and released  History of Mobitz type II AV block status post pacemaker implantation:  Pacemaker site is well-healed and clean dry and intact.  Most recent pacemaker report reviewed with the patient.  Findings noted above for further reference.  Mixed hyperlipidemia: Continue statin therapy.  Patient does not endorse any myalgias.  FINAL MEDICATION LIST END OF ENCOUNTER: No orders of the defined types were placed in this encounter.   There are no discontinued medications.   Current Outpatient Medications:  .  acetaminophen (TYLENOL) 325 MG tablet, Take 1-2 tablets (325-650 mg total) by mouth every 4 (four) hours as needed for mild pain., Disp: , Rfl:  .  Albuterol Sulfate (PROAIR RESPICLICK IN), Inhale into the lungs. , Disp: , Rfl:  .  aspirin 81 MG tablet, Take 81 mg by mouth daily., Disp: , Rfl:  .  Cholecalciferol (VITAMIN D3) 1000 UNITS CAPS, Take 1 capsule by mouth every evening. , Disp: , Rfl:  .  colchicine 0.6 MG tablet, Take 1 tablet (0.6 mg total) by mouth 2 (two) times daily., Disp: 30 tablet, Rfl: 1 .  fish oil-omega-3 fatty acids 1000 MG capsule, Take 1 g by mouth daily.  Taking 1200mg , Disp: , Rfl:  .  magnesium oxide (MAG-OX) 400 MG tablet, Take 400 mg by mouth daily., Disp: , Rfl:  .  metoprolol succinate (TOPROL-XL) 50 MG 24 hr tablet, TAKE 1 TABLET BY MOUTH DAILY WITH OR IMMEDIATELY FOLLOWING A MEAL, Disp: 90 tablet, Rfl: 1 .  Multiple Vitamins-Minerals (PRESERVISION AREDS 2) CAPS, Take 1 capsule by mouth 2 (two) times daily. , Disp: , Rfl:  .  nystatin (MYCOSTATIN/NYSTOP) powder,  Apply 1  application topically 3 (three) times daily., Disp: 15 g, Rfl: 0 .  olmesartan (BENICAR) 20 MG tablet, Take 1 tablet (20 mg total) by mouth daily., Disp: 90 tablet, Rfl: 0 .  potassium citrate (UROCIT-K) 10 MEQ (1080 MG) SR tablet, Take 10 mEq by mouth 2 (two) times daily., Disp: , Rfl:  .  simvastatin (ZOCOR) 20 MG tablet, TAKE 1 (ONE)TABLET DAILY IN THE EVENING, Disp: 90 tablet, Rfl: 3  Orders Placed This Encounter  Procedures  . Basic metabolic panel  . Magnesium   --Continue cardiac medications as reconciled in final medication list. --Return in about 6 months (around 02/28/2020) for BP follow up and labs before. . Or sooner if needed. --Continue follow-up with your primary care physician regarding the management of your other chronic comorbid conditions.  Patient's questions and concerns were addressed to her satisfaction. She voices understanding of the instructions provided during this encounter.   This note was created using a voice recognition software as a result there may be grammatical errors inadvertently enclosed that do not reflect the nature of this encounter. Every attempt is made to correct such errors.  Rex Kras, [place], [facility]  Pager: [phone] Office: [phone]

## 2019-09-02 ENCOUNTER — Ambulatory Visit (INDEPENDENT_AMBULATORY_CARE_PROVIDER_SITE_OTHER): Payer: Medicare Other | Admitting: Family Medicine

## 2019-09-02 ENCOUNTER — Other Ambulatory Visit: Payer: Self-pay

## 2019-09-02 ENCOUNTER — Encounter (INDEPENDENT_AMBULATORY_CARE_PROVIDER_SITE_OTHER): Payer: Self-pay | Admitting: Family Medicine

## 2019-09-02 VITALS — BP 128/68 | HR 82 | Temp 98.0°F | Ht 61.0 in | Wt 179.0 lb

## 2019-09-02 DIAGNOSIS — E669 Obesity, unspecified: Secondary | ICD-10-CM

## 2019-09-02 DIAGNOSIS — I10 Essential (primary) hypertension: Secondary | ICD-10-CM | POA: Diagnosis not present

## 2019-09-02 DIAGNOSIS — Z6833 Body mass index (BMI) 33.0-33.9, adult: Secondary | ICD-10-CM | POA: Diagnosis not present

## 2019-09-02 DIAGNOSIS — E7849 Other hyperlipidemia: Secondary | ICD-10-CM

## 2019-09-02 DIAGNOSIS — E559 Vitamin D deficiency, unspecified: Secondary | ICD-10-CM | POA: Diagnosis not present

## 2019-09-02 NOTE — Progress Notes (Signed)
Chief Complaint:   OBESITY Margaret Reese is here to discuss her progress with her obesity treatment plan along with follow-up of her obesity related diagnoses. Margaret Reese is on the Category 1 Plan and states she is following her eating plan approximately 60% of the time. Margaret Reese states she is doing floor exercises for 30 minutes 6-7 times per week.  Today's visit was #: 9 Starting weight: 212 lbs Starting date: 03/26/2019 Today's weight: 179 lbs Today's date: 09/02/2019 Total lbs lost to date: 33 lbs Total lbs lost since last in-office visit: 6 lbs  Interim History: Margaret Reese is down 33 pounds today, overall 41 (since January).  There were new findings on her recent echo - reviewed Cardiology note.  She reports feeling more shortness of breath with exertion over COVID lockdown after having (suspected) COVID.  Wondering if this was the cause.  Subjective:   1. Vitamin D deficiency Margaret Reese's Vitamin D level was 31.6 on 03/26/2019. She is currently taking prescription vitamin D 50,000 IU each week. She denies nausea, vomiting or muscle weakness.  2. Essential hypertension Review: taking medications as instructed, no medication side effects noted, no chest pain on exertion, no dyspnea on exertion, no swelling of ankles.  Blood pressure at home:  SBP 110-120.  DBP 60s.  Today on second check (manual):  128/68.  BP Readings from Last 3 Encounters:  09/02/19 128/68  08/28/19 (!) 144/62  08/21/19 140/86   3. Other hyperlipidemia Margaret Reese has hyperlipidemia and has been trying to improve her cholesterol levels with intensive lifestyle modification including a low saturated fat diet, exercise and weight loss. She denies any chest pain, claudication or myalgias.  Lab Results  Component Value Date   ALT 13 03/26/2019   AST 19 03/26/2019   ALKPHOS 132 (H) 03/26/2019   BILITOT 0.8 03/26/2019   Lab Results  Component Value Date   CHOL 152 03/26/2019   HDL 52 03/26/2019   LDLCALC 81 03/26/2019   TRIG 104  03/26/2019   CHOLHDL 2.9 11/20/2018   Assessment/Plan:   1. Vitamin D deficiency Low Vitamin D level contributes to fatigue and are associated with obesity, breast, and colon cancer. She agrees to continue to take prescription Vitamin D @50 ,000 IU every week and will follow-up for routine testing of Vitamin D, at least 2-3 times per year to avoid over-replacement.  2. Essential hypertension Margaret Reese is working on healthy weight loss and exercise to improve blood pressure control. We will watch for signs of hypotension as she continues her lifestyle modifications.  3. Other hyperlipidemia Cardiovascular risk and specific lipid/LDL goals reviewed.  We discussed several lifestyle modifications today and Via will continue to work on diet, exercise and weight loss efforts. Orders and follow up as documented in patient record.   Counseling Intensive lifestyle modifications are the first line treatment for this issue. . Dietary changes: Increase soluble fiber. Decrease simple carbohydrates. . Exercise changes: Moderate to vigorous-intensity aerobic activity 150 minutes per week if tolerated. . Lipid-lowering medications: see documented in medical record.  4. Class 1 obesity with serious comorbidity and body mass index (BMI) of 33.0 to 33.9 in adult, unspecified obesity type Margaret Reese is currently in the action stage of change. As such, her goal is to continue with weight loss efforts. She has agreed to the Category 1 Plan.   Exercise goals: As is.  Behavioral modification strategies: increasing lean protein intake and increasing high fiber foods.  Margaret Reese has agreed to follow-up with our clinic in 2-3 weeks. She  was informed of the importance of frequent follow-up visits to maximize her success with intensive lifestyle modifications for her multiple health conditions.   Objective:   Blood pressure 128/68, pulse 82, temperature 98 F (36.7 C), temperature source Oral, height 5\' 1"  (1.549 m), weight  179 lb (81.2 kg), SpO2 96 %. Body mass index is 33.82 kg/m.  General: Cooperative, alert, well developed, in no acute distress. HEENT: Conjunctivae and lids unremarkable. Cardiovascular: Regular rhythm.  Lungs: Normal work of breathing. Neurologic: No focal deficits.   Lab Results  Component Value Date   CREATININE 0.85 08/08/2019   BUN 18 08/08/2019   NA 143 08/08/2019   K 4.2 08/08/2019   CL 103 08/08/2019   CO2 24 08/08/2019   Lab Results  Component Value Date   ALT 13 03/26/2019   AST 19 03/26/2019   ALKPHOS 132 (H) 03/26/2019   BILITOT 0.8 03/26/2019   Lab Results  Component Value Date   HGBA1C 5.6 03/26/2019   Lab Results  Component Value Date   INSULIN 8.1 03/26/2019   Lab Results  Component Value Date   TSH 3.360 03/26/2019   Lab Results  Component Value Date   CHOL 152 03/26/2019   HDL 52 03/26/2019   LDLCALC 81 03/26/2019   TRIG 104 03/26/2019   CHOLHDL 2.9 11/20/2018   Lab Results  Component Value Date   WBC 6.7 03/26/2019   HGB 14.4 03/26/2019   HCT 44.1 03/26/2019   MCV 89 03/26/2019   PLT 243 03/26/2019   Lab Results  Component Value Date   IRON 64 03/26/2019   TIBC 301 03/26/2019   FERRITIN 260 (H) 03/26/2019   Obesity Behavioral Intervention:   Approximately 15 minutes were spent on the discussion below.  ASK: We discussed the diagnosis of obesity with Margaret Reese today and Margaret Reese agreed to give [name] permission to discuss obesity behavioral modification therapy today.  ASSESS: Margaret Reese has the diagnosis of obesity and her BMI today is 33.9. Margaret Reese is in the action stage of change.   ADVISE: Margaret Reese was educated on the multiple health risks of obesity as well as the benefit of weight loss to improve her health. She was advised of the need for long term treatment and the importance of lifestyle modifications to improve her current health and to decrease her risk of future health problems.  AGREE: Multiple dietary modification options and  treatment options were discussed and Margaret Reese agreed to follow the recommendations documented in the above note.  ARRANGE: Margaret Reese was educated on the importance of frequent visits to treat obesity as outlined per CMS and USPSTF guidelines and agreed to schedule her next follow up appointment today.  Attestation Statements:   Reviewed by clinician on day of visit: allergies, medications, problem list, medical history, surgical history, family history, social history, and previous encounter notes.  Time spent on visit including pre-visit chart review and post-visit care and charting was 17 minutes.   I, Water quality scientist, CMA, am acting as transcriptionist for [name], DO  I have reviewed the above documentation for accuracy and completeness, and I agree with the above. [name], DO

## 2019-09-21 DIAGNOSIS — I10 Essential (primary) hypertension: Secondary | ICD-10-CM | POA: Diagnosis not present

## 2019-09-25 ENCOUNTER — Ambulatory Visit (INDEPENDENT_AMBULATORY_CARE_PROVIDER_SITE_OTHER): Payer: Medicare Other | Admitting: Family Medicine

## 2019-09-25 ENCOUNTER — Other Ambulatory Visit: Payer: Self-pay

## 2019-09-25 ENCOUNTER — Encounter (INDEPENDENT_AMBULATORY_CARE_PROVIDER_SITE_OTHER): Payer: Self-pay | Admitting: Family Medicine

## 2019-09-25 VITALS — BP 124/78 | HR 61 | Temp 97.8°F | Ht 61.0 in | Wt 178.0 lb

## 2019-09-25 DIAGNOSIS — E559 Vitamin D deficiency, unspecified: Secondary | ICD-10-CM | POA: Diagnosis not present

## 2019-09-25 DIAGNOSIS — E669 Obesity, unspecified: Secondary | ICD-10-CM | POA: Diagnosis not present

## 2019-09-25 DIAGNOSIS — E7849 Other hyperlipidemia: Secondary | ICD-10-CM | POA: Diagnosis not present

## 2019-09-25 DIAGNOSIS — Z6833 Body mass index (BMI) 33.0-33.9, adult: Secondary | ICD-10-CM

## 2019-09-25 DIAGNOSIS — I10 Essential (primary) hypertension: Secondary | ICD-10-CM | POA: Diagnosis not present

## 2019-09-25 NOTE — Progress Notes (Signed)
Chief Complaint:   OBESITY Margaret Reese is here to discuss her progress with her obesity treatment plan along with follow-up of her obesity related diagnoses. Margaret Reese is on the Category 1 Plan and states she is following her eating plan approximately 70% of the time. Margaret Reese states she is doing water aerobics for 35 minutes 7 times per week.  Today's visit was #: 10 Starting weight: 212 lbs Starting date: 03/26/2019 Today's weight: 178 lbs Today's date: 09/25/2019 Total lbs lost to date: 34 lbs Total lbs lost since last in-office visit: 1 lb Total weight loss percentage to date: 16.04%  Interim History: Margaret Reese continues to make healthy decisions. She is enjoying water aerobics.   Subjective:   1. Vitamin D deficiency Margaret Reese's Vitamin D level was 31.6 on 03/26/2019. She is currently taking prescription vitamin D 50,000 IU each week.  2. Essential hypertension Review: taking medications as instructed, no medication side effects noted, no chest pain on exertion, no dyspnea on exertion, no swelling of ankles.   BP Readings from Last 3 Encounters:  09/25/19 124/78  09/02/19 128/68  08/28/19 (!) 144/62   3. Other hyperlipidemia Margaret Reese has hyperlipidemia and has been trying to improve her cholesterol levels with intensive lifestyle modification including a low saturated fat diet, exercise and weight loss. She denies any chest pain, claudication or myalgias.  Lab Results  Component Value Date   ALT 13 03/26/2019   AST 19 03/26/2019   ALKPHOS 132 (H) 03/26/2019   BILITOT 0.8 03/26/2019   Lab Results  Component Value Date   CHOL 152 03/26/2019   HDL 52 03/26/2019   LDLCALC 81 03/26/2019   TRIG 104 03/26/2019   CHOLHDL 2.9 11/20/2018   Assessment/Plan:   1. Vitamin D deficiency Not optimized. Low Vitamin D level contributes to fatigue and are associated with obesity, breast, and colon cancer. She agrees to continue to take prescription Vitamin D @50 ,000 IU every week and will follow-up for  routine testing of Vitamin D, at least 2-3 times per year to avoid over-replacement.  2. Essential hypertension Margaret Reese is working on healthy weight loss and exercise to improve blood pressure control. We will watch for signs of hypotension as she continues her lifestyle modifications.  3. Other hyperlipidemia Not optimized. Cardiovascular risk and specific lipid/LDL goals reviewed.  We discussed several lifestyle modifications today and Margaret Reese will continue to work on diet, exercise and weight loss efforts. Orders and follow up as documented in patient record.   4. Class 1 obesity with serious comorbidity and body mass index (BMI) of 33.0 to 33.9 in adult, unspecified obesity type Margaret Reese is currently in the action stage of change. As such, her goal is to continue with weight loss efforts. She has agreed to the Category 1 Plan.   Exercise goals: For substantial health benefits, adults should do at least 150 minutes (2 hours and 30 minutes) a week of moderate-intensity, or 75 minutes (1 hour and 15 minutes) a week of vigorous-intensity aerobic physical activity, or an equivalent combination of moderate- and vigorous-intensity aerobic activity. Aerobic activity should be performed in episodes of at least 10 minutes, and preferably, it should be spread throughout the week.  Behavioral modification strategies: increasing lean protein intake.  Margaret Reese has agreed to follow-up with our clinic in 3 weeks. She was informed of the importance of frequent follow-up visits to maximize her success with intensive lifestyle modifications for her multiple health conditions.   Objective:   Blood pressure 124/78, pulse 61, temperature 97.8  F (36.6 C), temperature source Oral, height 5\' 1"  (1.549 m), weight 178 lb (80.7 kg), SpO2 98 %. Body mass index is 33.63 kg/m.  General: Cooperative, alert, well developed, in no acute distress. HEENT: Conjunctivae and lids unremarkable. Cardiovascular: Regular rhythm.  Lungs:  Normal work of breathing. Neurologic: No focal deficits.   Lab Results  Component Value Date   CREATININE 0.85 08/08/2019   BUN 18 08/08/2019   NA 143 08/08/2019   K 4.2 08/08/2019   CL 103 08/08/2019   CO2 24 08/08/2019   Lab Results  Component Value Date   ALT 13 03/26/2019   AST 19 03/26/2019   ALKPHOS 132 (H) 03/26/2019   BILITOT 0.8 03/26/2019   Lab Results  Component Value Date   HGBA1C 5.6 03/26/2019   Lab Results  Component Value Date   INSULIN 8.1 03/26/2019   Lab Results  Component Value Date   TSH 3.360 03/26/2019   Lab Results  Component Value Date   CHOL 152 03/26/2019   HDL 52 03/26/2019   LDLCALC 81 03/26/2019   TRIG 104 03/26/2019   CHOLHDL 2.9 11/20/2018   Lab Results  Component Value Date   WBC 6.7 03/26/2019   HGB 14.4 03/26/2019   HCT 44.1 03/26/2019   MCV 89 03/26/2019   PLT 243 03/26/2019   Lab Results  Component Value Date   IRON 64 03/26/2019   TIBC 301 03/26/2019   FERRITIN 260 (H) 03/26/2019   Attestation Statements:   Reviewed by clinician on day of visit: allergies, medications, problem list, medical history, surgical history, family history, social history, and previous encounter notes.  Time spent on visit including pre-visit chart review and post-visit care and charting was 25 minutes.   I, Water quality scientist, CMA, am acting as transcriptionist for [name], DO  I have reviewed the above documentation for accuracy and completeness, and I agree with the above. [name], DO

## 2019-09-26 ENCOUNTER — Ambulatory Visit (INDEPENDENT_AMBULATORY_CARE_PROVIDER_SITE_OTHER): Payer: Medicare Other | Admitting: Family Medicine

## 2019-09-26 ENCOUNTER — Encounter: Payer: Self-pay | Admitting: Family Medicine

## 2019-09-26 VITALS — BP 130/86 | HR 64 | Temp 97.8°F | Wt 182.8 lb

## 2019-09-26 DIAGNOSIS — M109 Gout, unspecified: Secondary | ICD-10-CM | POA: Diagnosis not present

## 2019-09-26 NOTE — Patient Instructions (Signed)
 Gout  Gout is painful swelling of your joints. Gout is a type of arthritis. It is caused by having too much uric acid in your body. Uric acid is a chemical that is made when your body breaks down substances called purines. If your body has too much uric acid, sharp crystals can form and build up in your joints. This causes pain and swelling. Gout attacks can happen quickly and be very painful (acute gout). Over time, the attacks can affect more joints and happen more often (chronic gout). What are the causes?  Too much uric acid in your blood. This can happen because: ? Your kidneys do not remove enough uric acid from your blood. ? Your body makes too much uric acid. ? You eat too many foods that are high in purines. These foods include organ meats, some seafood, and beer.  Trauma or stress. What increases the risk?  Having a family history of gout.  Being female and middle-aged.  Being female and having gone through menopause.  Being very overweight (obese).  Drinking alcohol, especially beer.  Not having enough water in the body (being dehydrated).  Losing weight too quickly.  Having an organ transplant.  Having lead poisoning.  Taking certain medicines.  Having kidney disease.  Having a skin condition called psoriasis. What are the signs or symptoms? An attack of acute gout usually happens in just one joint. The most common place is the big toe. Attacks often start at night. Other joints that may be affected include joints of the feet, ankle, knee, fingers, wrist, or elbow. Symptoms of an attack may include:  Very bad pain.  Warmth.  Swelling.  Stiffness.  Shiny, red, or purple skin.  Tenderness. The affected joint may be very painful to touch.  Chills and fever. Chronic gout may cause symptoms more often. More joints may be involved. You may also have white or yellow lumps (tophi) on your hands or feet or in other areas near your joints. How is this  treated?  Treatment for this condition has two phases: treating an acute attack and preventing future attacks.  Acute gout treatment may include: ? NSAIDs. ? Steroids. These are taken by mouth or injected into a joint. ? Colchicine. This medicine relieves pain and swelling. It can be given by mouth or through an IV tube.  Preventive treatment may include: ? Taking small doses of NSAIDs or colchicine daily. ? Using a medicine that reduces uric acid levels in your blood. ? Making changes to your diet. You may need to see a food expert (dietitian) about what to eat and drink to prevent gout. Follow these instructions at home: During a gout attack   If told, put ice on the painful area: ? Put ice in a plastic bag. ? Place a towel between your skin and the bag. ? Leave the ice on for 20 minutes, 2-3 times a day.  Raise (elevate) the painful joint above the level of your heart as often as you can.  Rest the joint as much as possible. If the joint is in your leg, you may be given crutches.  Follow instructions from your doctor about what you cannot eat or drink. Avoiding future gout attacks  Eat a low-purine diet. Avoid foods and drinks such as: ? Liver. ? Kidney. ? Anchovies. ? Asparagus. ? Herring. ? Mushrooms. ? Mussels. ? Beer.  Stay at a healthy weight. If you want to lose weight, talk with your doctor. Do not lose   weight too fast.  Start or continue an exercise plan as told by your doctor. Eating and drinking  Drink enough fluids to keep your pee (urine) pale yellow.  If you drink alcohol: ? Limit how much you use to:  0-1 drink a day for women.  0-2 drinks a day for men. ? Be aware of how much alcohol is in your drink. In the [place] one drink equals one 12 oz bottle of beer (355 mL), one 5 oz glass of wine (148 mL), or one 1 oz glass of hard liquor (44 mL). General instructions  Take over-the-counter and prescription medicines only as told by your doctor.  Do  not drive or use heavy machinery while taking prescription pain medicine.  Return to your normal activities as told by your doctor. Ask your doctor what activities are safe for you.  Keep all follow-up visits as told by your doctor. This is important. Contact a doctor if:  You have another gout attack.  You still have symptoms of a gout attack after 10 days of treatment.  You have problems (side effects) because of your medicines.  You have chills or a fever.  You have burning pain when you pee (urinate).  You have pain in your lower back or belly. Get help right away if:  You have very bad pain.  Your pain cannot be controlled.  You cannot pee. Summary  Gout is painful swelling of the joints.  The most common site of pain is the big toe, but it can affect other joints.  Medicines and avoiding some foods can help to prevent and treat gout attacks. This information is not intended to replace advice given to you by your health care provider. Make sure you discuss any questions you have with your health care provider. Document Revised: 08/30/2017 Document Reviewed: 08/30/2017 Elsevier Patient Education  2020 [employer]  

## 2019-09-26 NOTE — Progress Notes (Signed)
   Subjective:    Patient ID: Margaret Reese, female    DOB: [birth year], 74 y.o.   MRN: [id number]  HPI She is here for a recheck.  She did take colchicine which relieved her wrist pain within less than 24 hours.  Unfortunately it did cause difficulty with diarrhea.  She did have another episode with pain in the right wrist but it went away with the use of Tylenol.  She does have a previous history of difficulty in one of her great toe several years ago.   Review of Systems     Objective:   Physical Exam Alert and in no distress.  Exam of both wrist shows no swelling, erythema or palpable tenderness to the wrist, fingers or elbows.       Assessment & Plan:  Acute gout of left hand, unspecified cause - Plan: Uric Acid I think she did indeed have an attack but now the question is do we do long-term therapy.  At this point she is not overly interested in doing that.

## 2019-09-27 LAB — URIC ACID: Uric Acid: 4.4 mg/dL (ref 3.1–7.9)

## 2019-10-22 DIAGNOSIS — I10 Essential (primary) hypertension: Secondary | ICD-10-CM | POA: Diagnosis not present

## 2019-10-23 ENCOUNTER — Ambulatory Visit (INDEPENDENT_AMBULATORY_CARE_PROVIDER_SITE_OTHER): Payer: Medicare Other | Admitting: Family Medicine

## 2019-10-23 ENCOUNTER — Other Ambulatory Visit: Payer: Self-pay

## 2019-10-23 VITALS — BP 132/80 | HR 83 | Temp 97.9°F | Ht 61.0 in | Wt 178.0 lb

## 2019-10-23 DIAGNOSIS — R632 Polyphagia: Secondary | ICD-10-CM

## 2019-10-23 DIAGNOSIS — E669 Obesity, unspecified: Secondary | ICD-10-CM

## 2019-10-23 DIAGNOSIS — E8881 Metabolic syndrome: Secondary | ICD-10-CM | POA: Diagnosis not present

## 2019-10-23 DIAGNOSIS — Z6833 Body mass index (BMI) 33.0-33.9, adult: Secondary | ICD-10-CM

## 2019-10-23 DIAGNOSIS — L659 Nonscarring hair loss, unspecified: Secondary | ICD-10-CM

## 2019-10-23 NOTE — Progress Notes (Signed)
Chief Complaint:   OBESITY Margaret Reese is here to discuss her progress with her obesity treatment plan along with follow-up of her obesity related diagnoses. Margaret Reese is on the Category 1 Plan and states she is following her eating plan approximately 0% of the time. Margaret Reese states she is doing aerobics for 35 minutes 7 times per week.  Today's visit was #: 11 Starting weight: 212 lbs Starting date: 03/26/2019 Today's weight: 178 lbs Today's date: 10/23/2019 Total lbs lost to date: 34 lbs Total lbs lost since last in-office visit: 0 Total weight loss percentage to date: 16.04%  Interim History: Margaret Reese says she is still exercising and is more hungry.  Assessment/Plan:   1. Polyphagia Margaret Reese endorses excessive hunger. Intensive lifestyle modifications are the first line treatment for this issue. We discussed several lifestyle modifications today, including making sure that she maintains hydration status and protein intake with a goal of 30 grams per meal and she will continue to work on diet, exercise and weight loss efforts.   2. Hair loss Hair loss associated with weight loss is usually Telogen Effluvium. Fluctuation in the BMI causes physical stress, which signals the hair follicles to move into an inactive stage. Plan: Focus on adequate protein intake, treat and monitor any nutritional deficiencies, and consider adding more calories to meal plan. Will continue to monitor symptoms as they relate to her weight loss journey.  3. Metabolic syndrome The 57-SVXB ASCVD risk score Margaret Bussing DC Jr., et al., 2013) is: 21.7%. Goal: Lose 7-10% of starting weight (already met). Counseling: We discussed several lifestyle modifications today and she will continue to work on diet, exercise and weight loss efforts.   4. Class 1 obesity with serious comorbidity and body mass index (BMI) of 33.0 to 33.9 in adult, unspecified obesity type Margaret Reese is currently in the action stage of change. As such, her goal is to continue  with weight loss efforts. She has agreed to the Category 1 Plan.   Exercise goals: For substantial health benefits, adults should do at least 150 minutes (2 hours and 30 minutes) a week of moderate-intensity, or 75 minutes (1 hour and 15 minutes) a week of vigorous-intensity aerobic physical activity, or an equivalent combination of moderate- and vigorous-intensity aerobic activity. Aerobic activity should be performed in episodes of at least 10 minutes, and preferably, it should be spread throughout the week.  Behavioral modification strategies: increasing lean protein intake.  Margaret Reese has agreed to follow-up with our clinic in 3 weeks. She was informed of the importance of frequent follow-up visits to maximize her success with intensive lifestyle modifications for her multiple health conditions.   Objective:   Blood pressure 132/80, pulse 83, temperature 97.9 F (36.6 C), temperature source Oral, height '5\' 1"'  (1.549 m), weight 178 lb (80.7 kg), SpO2 95 %. Body mass index is 33.63 kg/m.  General: Cooperative, alert, well developed, in no acute distress. HEENT: Conjunctivae and lids unremarkable. Cardiovascular: Regular rhythm.  Lungs: Normal work of breathing. Neurologic: No focal deficits.   Lab Results  Component Value Date   CREATININE 0.85 08/08/2019   BUN 18 08/08/2019   NA 143 08/08/2019   K 4.2 08/08/2019   CL 103 08/08/2019   CO2 24 08/08/2019   Lab Results  Component Value Date   ALT 13 03/26/2019   AST 19 03/26/2019   ALKPHOS 132 (H) 03/26/2019   BILITOT 0.8 03/26/2019   Lab Results  Component Value Date   HGBA1C 5.6 03/26/2019   Lab Results  Component Value Date   INSULIN 8.1 03/26/2019   Lab Results  Component Value Date   TSH 3.360 03/26/2019   Lab Results  Component Value Date   CHOL 152 03/26/2019   HDL 52 03/26/2019   LDLCALC 81 03/26/2019   TRIG 104 03/26/2019   CHOLHDL 2.9 11/20/2018   Lab Results  Component Value Date   WBC 6.7 03/26/2019     HGB 14.4 03/26/2019   HCT 44.1 03/26/2019   MCV 89 03/26/2019   PLT 243 03/26/2019   Lab Results  Component Value Date   IRON 64 03/26/2019   TIBC 301 03/26/2019   FERRITIN 260 (H) 03/26/2019   Obesity Behavioral Intervention:   Approximately 15 minutes were spent on the discussion below.  ASK: We discussed the diagnosis of obesity with Margaret Reese today and Margaret Reese agreed to give [name] permission to discuss obesity behavioral modification therapy today.  ASSESS: Margaret Reese has the diagnosis of obesity and her BMI today is 33.8. Margaret Reese is in the action stage of change.   ADVISE: Margaret Reese was educated on the multiple health risks of obesity as well as the benefit of weight loss to improve her health. She was advised of the need for long term treatment and the importance of lifestyle modifications to improve her current health and to decrease her risk of future health problems.  AGREE: Multiple dietary modification options and treatment options were discussed and Margaret Reese agreed to follow the recommendations documented in the above note.  ARRANGE: Margaret Reese was educated on the importance of frequent visits to treat obesity as outlined per CMS and USPSTF guidelines and agreed to schedule her next follow up appointment today.  Attestation Statements:   Reviewed by clinician on day of visit: allergies, medications, problem list, medical history, surgical history, family history, social history, and previous encounter notes.  I, Water quality scientist, CMA, am acting as transcriptionist for [name], DO  I have reviewed the above documentation for accuracy and completeness, and I agree with the above. [name], DO

## 2019-10-24 DIAGNOSIS — Z23 Encounter for immunization: Secondary | ICD-10-CM | POA: Diagnosis not present

## 2019-11-01 ENCOUNTER — Encounter (INDEPENDENT_AMBULATORY_CARE_PROVIDER_SITE_OTHER): Payer: Self-pay | Admitting: Family Medicine

## 2019-11-12 DIAGNOSIS — Z1231 Encounter for screening mammogram for malignant neoplasm of breast: Secondary | ICD-10-CM | POA: Diagnosis not present

## 2019-11-12 LAB — HM MAMMOGRAPHY

## 2019-11-13 ENCOUNTER — Encounter (INDEPENDENT_AMBULATORY_CARE_PROVIDER_SITE_OTHER): Payer: Self-pay | Admitting: Family Medicine

## 2019-11-13 ENCOUNTER — Other Ambulatory Visit: Payer: Self-pay

## 2019-11-13 ENCOUNTER — Ambulatory Visit (INDEPENDENT_AMBULATORY_CARE_PROVIDER_SITE_OTHER): Payer: Medicare Other | Admitting: Family Medicine

## 2019-11-13 VITALS — BP 142/77 | HR 70 | Temp 98.2°F | Ht 61.0 in | Wt 177.0 lb

## 2019-11-13 DIAGNOSIS — E8881 Metabolic syndrome: Secondary | ICD-10-CM | POA: Diagnosis not present

## 2019-11-13 DIAGNOSIS — R0602 Shortness of breath: Secondary | ICD-10-CM

## 2019-11-13 DIAGNOSIS — I10 Essential (primary) hypertension: Secondary | ICD-10-CM

## 2019-11-13 DIAGNOSIS — E669 Obesity, unspecified: Secondary | ICD-10-CM | POA: Diagnosis not present

## 2019-11-13 DIAGNOSIS — Z6833 Body mass index (BMI) 33.0-33.9, adult: Secondary | ICD-10-CM | POA: Diagnosis not present

## 2019-11-14 NOTE — Progress Notes (Signed)
Chief Complaint:   OBESITY Margaret Reese is here to discuss her progress with her obesity treatment plan along with follow-up of her obesity related diagnoses. Margaret Reese is on the Category 1 Plan and states she is following her eating plan approximately 40% of the time. Margaret Reese states she is stretching and doing low impact exercises for 45 minutes 6-7 times per week.  Today's visit was #: 12 Starting weight: 212 lbs Starting date: 03/26/2019 Today's weight: 177 lbs Today's date: 11/13/2019 Total lbs lost to date: 35 lbs Total lbs lost since last in-office visit: 1 lb Total weight loss percentage to date: -16.51%  Interim History: Margaret Reese's RMR went from 905 to 1351.  Her SBP was 107 when taken at home this morning. She is struggling with nocturia q 2 hours. Plan:  Get water in prior to 5 pm to avoid nocturia.  Assessment/Plan:   1. Shortness of breath on exertion New IC today. RMR has increased significantly.   2. Metabolic syndrome Improving. Goal: Lose 7-10% of starting weight. She will continue to focus on protein-rich, low simple carbohydrate foods. We reviewed the importance of hydration, regular exercise for stress reduction, and restorative sleep.   3. Essential hypertension Plan: Monitor home BP. Diet: Avoid buying foods that are: processed, frozen, or prepackaged to avoid excess salt. Recheck BP at next visit. The patient understands monitoring parameters and red flags.   BP Readings from Last 3 Encounters:  11/13/19 (!) 142/77  10/23/19 132/80  09/26/19 130/86   4. Class 1 obesity with serious comorbidity and body mass index (BMI) of 33.0 to 33.9 in adult, unspecified obesity type Margaret Reese is currently in the action stage of change. As such, her goal is to continue with weight loss efforts. She has agreed to the Category 1 Plan.   Exercise goals: For substantial health benefits, adults should do at least 150 minutes (2 hours and 30 minutes) a week of moderate-intensity, or 75 minutes  (1 hour and 15 minutes) a week of vigorous-intensity aerobic physical activity, or an equivalent combination of moderate- and vigorous-intensity aerobic activity. Aerobic activity should be performed in episodes of at least 10 minutes, and preferably, it should be spread throughout the week.  Behavioral modification strategies: increasing lean protein intake, increasing water intake and decreasing sodium intake.  Margaret Reese has agreed to follow-up with our clinic in 3 weeks. She was informed of the importance of frequent follow-up visits to maximize her success with intensive lifestyle modifications for her multiple health conditions.   Objective:   Blood pressure (!) 142/77, pulse 70, temperature 98.2 F (36.8 C), temperature source Oral, height 5\' 1"  (1.549 m), weight 177 lb (80.3 kg), SpO2 95 %. Body mass index is 33.44 kg/m.  General: Cooperative, alert, well developed, in no acute distress. HEENT: Conjunctivae and lids unremarkable. Cardiovascular: Regular rhythm.  Lungs: Normal work of breathing. Neurologic: No focal deficits.   Lab Results  Component Value Date   CREATININE 0.85 08/08/2019   BUN 18 08/08/2019   NA 143 08/08/2019   K 4.2 08/08/2019   CL 103 08/08/2019   CO2 24 08/08/2019   Lab Results  Component Value Date   ALT 13 03/26/2019   AST 19 03/26/2019   ALKPHOS 132 (H) 03/26/2019   BILITOT 0.8 03/26/2019   Lab Results  Component Value Date   HGBA1C 5.6 03/26/2019   Lab Results  Component Value Date   INSULIN 8.1 03/26/2019   Lab Results  Component Value Date   TSH 3.360  03/26/2019   Lab Results  Component Value Date   CHOL 152 03/26/2019   HDL 52 03/26/2019   LDLCALC 81 03/26/2019   TRIG 104 03/26/2019   CHOLHDL 2.9 11/20/2018   Lab Results  Component Value Date   WBC 6.7 03/26/2019   HGB 14.4 03/26/2019   HCT 44.1 03/26/2019   MCV 89 03/26/2019   PLT 243 03/26/2019   Lab Results  Component Value Date   IRON 64 03/26/2019   TIBC 301  03/26/2019   FERRITIN 260 (H) 03/26/2019   Obesity Behavioral Intervention:   Approximately 15 minutes were spent on the discussion below.  ASK: We discussed the diagnosis of obesity with Margaret Reese today and Margaret Reese agreed to give Margaret Reese permission to discuss obesity behavioral modification therapy today.  ASSESS: [name] has the diagnosis of obesity and her BMI today is 33.4. Margaret Reese is in the action stage of change.   ADVISE: Margaret Reese was educated on the multiple health risks of obesity as well as the benefit of weight loss to improve her health. She was advised of the need for long term treatment and the importance of lifestyle modifications to improve her current health and to decrease her risk of future health problems.  AGREE: Multiple dietary modification options and treatment options were discussed and Margaret Reese agreed to follow the recommendations documented in the above note.  ARRANGE: Margaret Reese was educated on the importance of frequent visits to treat obesity as outlined per CMS and USPSTF guidelines and agreed to schedule her next follow up appointment today.  Attestation Statements:   Reviewed by clinician on day of visit: allergies, medications, problem list, medical history, surgical history, family history, social history, and previous encounter notes.  I, Water quality scientist, CMA, am acting as transcriptionist for Margaret Deutscher, DO  I have reviewed the above documentation for accuracy and completeness, and I agree with the above. Margaret Deutscher, DO

## 2019-11-19 ENCOUNTER — Encounter: Payer: Self-pay | Admitting: Family Medicine

## 2019-11-21 DIAGNOSIS — I10 Essential (primary) hypertension: Secondary | ICD-10-CM | POA: Diagnosis not present

## 2019-12-05 ENCOUNTER — Ambulatory Visit (INDEPENDENT_AMBULATORY_CARE_PROVIDER_SITE_OTHER): Payer: Medicare Other | Admitting: Family Medicine

## 2019-12-05 ENCOUNTER — Encounter (INDEPENDENT_AMBULATORY_CARE_PROVIDER_SITE_OTHER): Payer: Self-pay | Admitting: Family Medicine

## 2019-12-05 ENCOUNTER — Other Ambulatory Visit: Payer: Self-pay

## 2019-12-05 VITALS — BP 126/72 | HR 69 | Temp 98.2°F | Ht 61.0 in | Wt 177.0 lb

## 2019-12-05 DIAGNOSIS — I10 Essential (primary) hypertension: Secondary | ICD-10-CM

## 2019-12-05 DIAGNOSIS — E65 Localized adiposity: Secondary | ICD-10-CM | POA: Diagnosis not present

## 2019-12-05 DIAGNOSIS — E7849 Other hyperlipidemia: Secondary | ICD-10-CM

## 2019-12-05 DIAGNOSIS — Z6836 Body mass index (BMI) 36.0-36.9, adult: Secondary | ICD-10-CM | POA: Diagnosis not present

## 2019-12-05 DIAGNOSIS — R632 Polyphagia: Secondary | ICD-10-CM

## 2019-12-09 DIAGNOSIS — Z23 Encounter for immunization: Secondary | ICD-10-CM | POA: Diagnosis not present

## 2019-12-09 NOTE — Progress Notes (Signed)
Chief Complaint:   OBESITY Margaret Reese is here to discuss her progress with her obesity treatment plan along with follow-up of her obesity related diagnoses. Margaret Reese is on the Category 1 Plan and states she is following her eating plan approximately 45% of the time. Margaret Reese states she is stretching and doing aerobics for 45 minutes 6-7 times per week.  Today's visit was #: 77 Starting weight: 212 lbs Starting date: 03/26/2019 Today's weight: 177 lbs Today's date: 12/05/2019 Total lbs lost to date: 35 lbs Total lbs lost since last in-office visit: 0 Total weight loss percentage to date: -16.51%  Interim History: Margaret Reese has been having sugar cravings and has been struggling with night eating.  She says, "I always want to eat and never feel full".  She says she is drinking 64 ounces of water per day.    Plan:  Add unlimited fruits/vegetables to diet.  Assessment/Plan:   1. Polyphagia Hyperphagia, also called polyphagia, refers to excessive feelings of hunger, which are not relieved by eating. This is more likely to be an issues for people that have diabetes, prediabetes, or insulin resistance. She will continue to focus on protein-rich, low simple carbohydrate foods. We reviewed the importance of hydration, regular exercise for stress reduction, and restorative sleep.  2. Essential hypertension At goal. Medications: Toprol-XL 50 mg daily and Benicar 20 mg daily. We will monitor for hypotension with continued weight loss.   BP Readings from Last 3 Encounters:  12/05/19 126/72  11/13/19 (!) 142/77  10/23/19 132/80   Lab Results  Component Value Date   CREATININE 0.85 08/08/2019   3. Other hyperlipidemia Well controlled on current regimen. LDL is at goal on current statin dose, patient has no side effects from medication. She is taking Zocor 20 mg daily and fish oil. The current medical regimen is effective;  continue present plan and medications.  Lab Results  Component Value Date   ALT  13 03/26/2019   AST 19 03/26/2019   ALKPHOS 132 (H) 03/26/2019   BILITOT 0.8 03/26/2019   Lab Results  Component Value Date   CHOL 152 03/26/2019   HDL 52 03/26/2019   LDLCALC 81 03/26/2019   TRIG 104 03/26/2019   CHOLHDL 2.9 11/20/2018   4. Visceral obesity Current visceral fat rating: 15. Visceral adipose tissue is a hormonally active component of total body fat. This body composition phenotype is associated with medical disorders such as metabolic syndrome, cardiovascular disease and several malignancies including prostate, breast, and colorectal cancers. Goal: Lose 7-10% of starting weight. Visceral fat rating should be < 13.  5. Class 2 severe obesity with serious comorbidity and body mass index (BMI) of 36.0 to 36.9 in adult, unspecified obesity type [facility])  Margaret Reese is currently in the action stage of change. As such, her goal is to continue with weight loss efforts. She has agreed to the Category 1 Plan.   Exercise goals: For substantial health benefits, adults should do at least 150 minutes (2 hours and 30 minutes) a week of moderate-intensity, or 75 minutes (1 hour and 15 minutes) a week of vigorous-intensity aerobic physical activity, or an equivalent combination of moderate- and vigorous-intensity aerobic activity. Aerobic activity should be performed in episodes of at least 10 minutes, and preferably, it should be spread throughout the week.  Behavioral modification strategies: increasing lean protein intake, decreasing simple carbohydrates, increasing vegetables, increasing water intake and ways to avoid night time snacking.  Margaret Reese has agreed to follow-up with our clinic in 3  weeks. She was informed of the importance of frequent follow-up visits to maximize her success with intensive lifestyle modifications for her multiple health conditions.   Objective:   Blood pressure 126/72, pulse 69, temperature 98.2 F (36.8 C), temperature source Oral, height 5\' 1"  (1.549 m), weight 177  lb (80.3 kg), SpO2 97 %. Body mass index is 33.44 kg/m.  General: Cooperative, alert, well developed, in no acute distress. HEENT: Conjunctivae and lids unremarkable. Cardiovascular: Regular rhythm.  Lungs: Normal work of breathing. Neurologic: No focal deficits.   Lab Results  Component Value Date   CREATININE 0.85 08/08/2019   BUN 18 08/08/2019   NA 143 08/08/2019   K 4.2 08/08/2019   CL 103 08/08/2019   CO2 24 08/08/2019   Lab Results  Component Value Date   ALT 13 03/26/2019   AST 19 03/26/2019   ALKPHOS 132 (H) 03/26/2019   BILITOT 0.8 03/26/2019   Lab Results  Component Value Date   HGBA1C 5.6 03/26/2019   Lab Results  Component Value Date   INSULIN 8.1 03/26/2019   Lab Results  Component Value Date   TSH 3.360 03/26/2019   Lab Results  Component Value Date   CHOL 152 03/26/2019   HDL 52 03/26/2019   LDLCALC 81 03/26/2019   TRIG 104 03/26/2019   CHOLHDL 2.9 11/20/2018   Lab Results  Component Value Date   WBC 6.7 03/26/2019   HGB 14.4 03/26/2019   HCT 44.1 03/26/2019   MCV 89 03/26/2019   PLT 243 03/26/2019   Lab Results  Component Value Date   IRON 64 03/26/2019   TIBC 301 03/26/2019   FERRITIN 260 (H) 03/26/2019   Obesity Behavioral Intervention:   Approximately 15 minutes were spent on the discussion below.  ASK: We discussed the diagnosis of obesity with Margaret Reese today and Margaret Reese agreed to give [name] permission to discuss obesity behavioral modification therapy today.  ASSESS: Margaret Reese has the diagnosis of obesity and her BMI today is 33.6. Margaret Reese is in the action stage of change.   ADVISE: Margaret Reese was educated on the multiple health risks of obesity as well as the benefit of weight loss to improve her health. She was advised of the need for long term treatment and the importance of lifestyle modifications to improve her current health and to decrease her risk of future health problems.  AGREE: Multiple dietary modification options and treatment  options were discussed and Margaret Reese agreed to follow the recommendations documented in the above note.  ARRANGE: Margaret Reese was educated on the importance of frequent visits to treat obesity as outlined per CMS and USPSTF guidelines and agreed to schedule her next follow up appointment today.  Attestation Statements:   Reviewed by clinician on day of visit: allergies, medications, problem list, medical history, surgical history, family history, social history, and previous encounter notes.  I, Water quality scientist, CMA, am acting as transcriptionist for [name], DO  I have reviewed the above documentation for accuracy and completeness, and I agree with the above. [name], DO

## 2019-12-10 DIAGNOSIS — Z950 Presence of cardiac pacemaker: Secondary | ICD-10-CM | POA: Diagnosis not present

## 2019-12-10 DIAGNOSIS — Z45018 Encounter for adjustment and management of other part of cardiac pacemaker: Secondary | ICD-10-CM | POA: Diagnosis not present

## 2019-12-10 DIAGNOSIS — I441 Atrioventricular block, second degree: Secondary | ICD-10-CM | POA: Diagnosis not present

## 2019-12-18 ENCOUNTER — Other Ambulatory Visit: Payer: Self-pay | Admitting: Cardiology

## 2019-12-18 DIAGNOSIS — I10 Essential (primary) hypertension: Secondary | ICD-10-CM

## 2019-12-22 DIAGNOSIS — I10 Essential (primary) hypertension: Secondary | ICD-10-CM | POA: Diagnosis not present

## 2020-01-06 ENCOUNTER — Ambulatory Visit (INDEPENDENT_AMBULATORY_CARE_PROVIDER_SITE_OTHER): Payer: Medicare Other | Admitting: Family Medicine

## 2020-01-07 ENCOUNTER — Ambulatory Visit (INDEPENDENT_AMBULATORY_CARE_PROVIDER_SITE_OTHER): Payer: Medicare Other | Admitting: Family Medicine

## 2020-01-07 ENCOUNTER — Other Ambulatory Visit: Payer: Self-pay

## 2020-01-07 ENCOUNTER — Encounter (INDEPENDENT_AMBULATORY_CARE_PROVIDER_SITE_OTHER): Payer: Self-pay | Admitting: Family Medicine

## 2020-01-07 VITALS — HR 69 | Temp 97.6°F | Ht 61.0 in | Wt 177.0 lb

## 2020-01-07 DIAGNOSIS — E559 Vitamin D deficiency, unspecified: Secondary | ICD-10-CM

## 2020-01-07 DIAGNOSIS — E785 Hyperlipidemia, unspecified: Secondary | ICD-10-CM

## 2020-01-07 DIAGNOSIS — Z6833 Body mass index (BMI) 33.0-33.9, adult: Secondary | ICD-10-CM

## 2020-01-07 DIAGNOSIS — I10 Essential (primary) hypertension: Secondary | ICD-10-CM | POA: Diagnosis not present

## 2020-01-07 DIAGNOSIS — E669 Obesity, unspecified: Secondary | ICD-10-CM

## 2020-01-09 NOTE — Progress Notes (Signed)
Chief Complaint:   OBESITY Margaret Reese is here to discuss her progress with her obesity treatment plan along with follow-up of her obesity related diagnoses.   Today's visit was #: 14 Starting weight: 212 lbs Starting date: 03/26/2019 Today's weight: 177 lbs Today's date: 01/07/2020 Total lbs lost to date: 35 lbs Body mass index is 33.44 kg/m.  Total weight loss percentage to date: -16.51%  Interim History: Margaret Reese says she has been doing some emotional eating and has been skipping lunch to eat later with her husband.  She has had 2 friends die recently, and 1 in Hospice.  She says that her husband is showing signs of dementia as well. Nutrition Plan: the Category 1 Plan for 25% of the time.  Cravings are poorly controlled controlled.  Activity: Low impact aerobics and stretching for 30-45 minutes 7 times per week.  Assessment/Plan:   1. Essential hypertension Low today.  Medications:  olmesartan 20 mg daily.  Plan:  Recommend decreasing olmesartan by 1/2.  Avoid buying foods that are: processed, frozen, or prepackaged to avoid excess salt. We will continue to monitor symptoms as they relate to her weight loss journey.  BP Readings from Last 3 Encounters:  12/05/19 126/72  11/13/19 (!) 142/77  10/23/19 132/80   Lab Results  Component Value Date   CREATININE 0.85 08/08/2019   2. Hyperlipidemia, unspecified hyperlipidemia type Course: Stable. Lipid-lowering medications: Zocor 20 mg daily, Omega 3.   Plan: Dietary changes: Increase soluble fiber. Decrease simple carbohydrates. Exercise changes: An average 40 minutes of moderate to vigorous-intensity aerobic activity 3 or 4 times per week.   Lab Results  Component Value Date   CHOL 152 03/26/2019   HDL 52 03/26/2019   LDLCALC 81 03/26/2019   TRIG 104 03/26/2019   CHOLHDL 2.9 11/20/2018   Lab Results  Component Value Date   ALT 13 03/26/2019   AST 19 03/26/2019   ALKPHOS 132 (H) 03/26/2019   BILITOT 0.8 03/26/2019    The 10-year ASCVD risk score Mikey Bussing DC Jr., et al., 2013) is: 20%   Values used to calculate the score:     Age: 74 years     Sex: Female     Is Non-Hispanic African American: No     Diabetic: No     Tobacco smoker: No     Systolic Blood Pressure: 093 mmHg     Is BP treated: Yes     HDL Cholesterol: 52 mg/dL     Total Cholesterol: 152 mg/dL  3. Vitamin D deficiency Not at goal. Current vitamin D is 31.6, tested on 03/26/2019. Optimal goal > 50 ng/dL.  She is taking OTC vitamin D 1,000 IU daily.   Plan:  []   Continue Vitamin D @50 ,000 IU every week. [x]   Continue home supplement daily. [x]   Follow-up for routine testing of Vitamin D at least 2-3 times per year to avoid over-replacement.  4. Class 1 obesity with serious comorbidity and body mass index (BMI) of 33.0 to 33.9 in adult, unspecified obesity type  Course: Margaret Reese is currently in the action stage of change. As such, her goal is to continue with weight loss efforts.   Nutrition goals: She has agreed to the Category 1 Plan.   Exercise goals: As is.  Behavioral modification strategies: increasing lean protein intake, decreasing simple carbohydrates, increasing vegetables and increasing water intake.  Margaret Reese has agreed to follow-up with our clinic in 3-4 weeks. She was informed of the importance of frequent follow-up visits  to maximize her success with intensive lifestyle modifications for her multiple health conditions.   Objective:   Pulse 69, temperature 97.6 F (36.4 C), height 5\' 1"  (1.549 m), weight 177 lb (80.3 kg), SpO2 98 %. Body mass index is 33.44 kg/m.  General: Cooperative, alert, well developed, in no acute distress. HEENT: Conjunctivae and lids unremarkable. Cardiovascular: Regular rhythm.  Lungs: Normal work of breathing. Neurologic: No focal deficits.   Lab Results  Component Value Date   CREATININE 0.85 08/08/2019   BUN 18 08/08/2019   NA 143 08/08/2019   K 4.2 08/08/2019   CL 103 08/08/2019    CO2 24 08/08/2019   Lab Results  Component Value Date   ALT 13 03/26/2019   AST 19 03/26/2019   ALKPHOS 132 (H) 03/26/2019   BILITOT 0.8 03/26/2019   Lab Results  Component Value Date   HGBA1C 5.6 03/26/2019   Lab Results  Component Value Date   INSULIN 8.1 03/26/2019   Lab Results  Component Value Date   TSH 3.360 03/26/2019   Lab Results  Component Value Date   CHOL 152 03/26/2019   HDL 52 03/26/2019   LDLCALC 81 03/26/2019   TRIG 104 03/26/2019   CHOLHDL 2.9 11/20/2018   Lab Results  Component Value Date   WBC 6.7 03/26/2019   HGB 14.4 03/26/2019   HCT 44.1 03/26/2019   MCV 89 03/26/2019   PLT 243 03/26/2019   Lab Results  Component Value Date   IRON 64 03/26/2019   TIBC 301 03/26/2019   FERRITIN 260 (H) 03/26/2019   Obesity Behavioral Intervention:   Approximately 15 minutes were spent on the discussion below.  ASK: We discussed the diagnosis of obesity with Shateria today and Jalesia agreed to give [name] permission to discuss obesity behavioral modification therapy today.  ASSESS: Mikayela has the diagnosis of obesity and her BMI today is 33.6. Sadako is in the action stage of change.   ADVISE: Reginia was educated on the multiple health risks of obesity as well as the benefit of weight loss to improve her health. She was advised of the need for long term treatment and the importance of lifestyle modifications to improve her current health and to decrease her risk of future health problems.  AGREE: Multiple dietary modification options and treatment options were discussed and Magdaline agreed to follow the recommendations documented in the above note.  ARRANGE: Dorri was educated on the importance of frequent visits to treat obesity as outlined per CMS and USPSTF guidelines and agreed to schedule her next follow up appointment today.  Attestation Statements:   Reviewed by clinician on day of visit: allergies, medications, problem list, medical history, surgical  history, family history, social history, and previous encounter notes.  I, Water quality scientist, CMA, am acting as transcriptionist for [name], DO  I have reviewed the above documentation for accuracy and completeness, and I agree with the above. [name], DO

## 2020-01-21 DIAGNOSIS — I10 Essential (primary) hypertension: Secondary | ICD-10-CM | POA: Diagnosis not present

## 2020-01-29 ENCOUNTER — Ambulatory Visit (INDEPENDENT_AMBULATORY_CARE_PROVIDER_SITE_OTHER): Payer: Medicare Other | Admitting: Family Medicine

## 2020-02-13 ENCOUNTER — Other Ambulatory Visit: Payer: Self-pay | Admitting: Family Medicine

## 2020-02-13 DIAGNOSIS — E785 Hyperlipidemia, unspecified: Secondary | ICD-10-CM

## 2020-02-21 DIAGNOSIS — I10 Essential (primary) hypertension: Secondary | ICD-10-CM | POA: Diagnosis not present

## 2020-02-26 ENCOUNTER — Encounter (INDEPENDENT_AMBULATORY_CARE_PROVIDER_SITE_OTHER): Payer: Self-pay | Admitting: Family Medicine

## 2020-02-26 ENCOUNTER — Other Ambulatory Visit: Payer: Self-pay

## 2020-02-26 ENCOUNTER — Ambulatory Visit (INDEPENDENT_AMBULATORY_CARE_PROVIDER_SITE_OTHER): Payer: Medicare Other | Admitting: Family Medicine

## 2020-02-26 VITALS — BP 143/78 | HR 69 | Temp 97.8°F | Ht 61.0 in | Wt 182.0 lb

## 2020-02-26 DIAGNOSIS — Z6834 Body mass index (BMI) 34.0-34.9, adult: Secondary | ICD-10-CM

## 2020-02-26 DIAGNOSIS — E8881 Metabolic syndrome: Secondary | ICD-10-CM

## 2020-02-26 DIAGNOSIS — R0602 Shortness of breath: Secondary | ICD-10-CM

## 2020-02-26 DIAGNOSIS — E669 Obesity, unspecified: Secondary | ICD-10-CM | POA: Diagnosis not present

## 2020-02-26 DIAGNOSIS — I10 Essential (primary) hypertension: Secondary | ICD-10-CM

## 2020-02-26 DIAGNOSIS — F3289 Other specified depressive episodes: Secondary | ICD-10-CM

## 2020-02-27 LAB — BASIC METABOLIC PANEL
BUN/Creatinine Ratio: 23 (ref 12–28)
BUN: 19 mg/dL (ref 8–27)
CO2: 25 mmol/L (ref 20–29)
Calcium: 10.2 mg/dL (ref 8.7–10.3)
Chloride: 102 mmol/L (ref 96–106)
Creatinine, Ser: 0.84 mg/dL (ref 0.57–1.00)
GFR calc Af Amer: 79 mL/min/{1.73_m2} (ref >59)
GFR calc non Af Amer: 68 mL/min/{1.73_m2} (ref >59)
Glucose: 87 mg/dL (ref 65–99)
Potassium: 4.2 mmol/L (ref 3.5–5.2)
Sodium: 142 mmol/L (ref 134–144)

## 2020-02-27 LAB — MAGNESIUM: Magnesium: 2.1 mg/dL (ref 1.6–2.3)

## 2020-02-27 NOTE — Progress Notes (Signed)
Chief Complaint:   OBESITY Margaret Reese is here to discuss her progress with her obesity treatment plan along with follow-up of her obesity related diagnoses.   Today's visit was #: 15 Starting weight: 212 lbs Starting date: 03/26/2019 Today's weight: 182 lbs Today's date: 02/26/2020 Total lbs lost to date: 30 lbs Body mass index is 34.39 kg/m.  Total weight loss percentage to date: -14.15%  Interim History: Margaret Reese is having blood work with her cardiologist today.  Blood pressure at home was 113/50. Nutrition Plan: the Category 1 Plan for 25-30% of the time.  Activity: Floor exercises for 40-45 minutes 5-7 times per week.  Assessment/Plan:   1. Essential hypertension Elevated today.  Medications: Olmesartan 20 mg daily (previously decreased due to weight loss).   Plan:  Monitor and increase medication again if not improving.  BP Readings from Last 3 Encounters:  02/26/20 (!) 143/78  12/05/19 126/72  11/13/19 (!) 142/77   Lab Results  Component Value Date   CREATININE 0.84 02/26/2020   2. Metabolic syndrome Starting goal: Lose 7-10% of starting weight. She will continue to focus on protein-rich, low simple carbohydrate foods. We reviewed the importance of hydration, regular exercise for stress reduction, and restorative sleep.  We will continue to check lab work every 3 months, with 10% weight loss, or should any other concerns arise.  Visceral obesity rating is 15.  The 10-year ASCVD risk score Margaret George DC Montez Hageman., et al., 2013) is: 25%   Values used to calculate the score:     Age: 75 years     Sex: Female     Is Non-Hispanic African American: No     Diabetic: No     Tobacco smoker: No     Systolic Blood Pressure: 143 mmHg     Is BP treated: Yes     HDL Cholesterol: 52 mg/dL     Total Cholesterol: 152 mg/dL  3. SOB (shortness of breath) on exertion New IC today showed RMR of 1427.  4. Other depression, with emotional eating Motivational interviewing as well as  evidence-based interventions for health behavior change were utilized today including the discussion of self monitoring techniques, problem-solving barriers and SMART goal setting techniques.   5. Class 1 obesity with serious comorbidity and body mass index (BMI) of 34.0 to 34.9 in adult, unspecified obesity type  Course: Margaret Reese is currently in the action stage of change. As such, her goal is to continue with weight loss efforts.   Nutrition goals: She has agreed to the Category 1 Plan.   Exercise goals: For substantial health benefits, adults should do at least 150 minutes (2 hours and 30 minutes) a week of moderate-intensity, or 75 minutes (1 hour and 15 minutes) a week of vigorous-intensity aerobic physical activity, or an equivalent combination of moderate- and vigorous-intensity aerobic activity. Aerobic activity should be performed in episodes of at least 10 minutes, and preferably, it should be spread throughout the week.  Behavioral modification strategies: increasing lean protein intake, decreasing simple carbohydrates, increasing vegetables, increasing water intake, decreasing liquid calories and emotional eating strategies.  Margaret Reese has agreed to follow-up with our clinic in 3 weeks. She was informed of the importance of frequent follow-up visits to maximize her success with intensive lifestyle modifications for her multiple health conditions.   Objective:   Blood pressure (!) 143/78, pulse 69, temperature 97.8 F (36.6 C), temperature source Oral, height 5\' 1"  (1.549 m), weight 182 lb (82.6 kg), SpO2 98 %. Body mass index  is 34.39 kg/m.  General: Cooperative, alert, well developed, in no acute distress. HEENT: Conjunctivae and lids unremarkable. Cardiovascular: Regular rhythm.  Lungs: Normal work of breathing. Neurologic: No focal deficits.   Lab Results  Component Value Date   CREATININE 0.84 02/26/2020   BUN 19 02/26/2020   NA 142 02/26/2020   K 4.2 02/26/2020   CL 102  02/26/2020   CO2 25 02/26/2020   Lab Results  Component Value Date   ALT 13 03/26/2019   AST 19 03/26/2019   ALKPHOS 132 (H) 03/26/2019   BILITOT 0.8 03/26/2019   Lab Results  Component Value Date   HGBA1C 5.6 03/26/2019   Lab Results  Component Value Date   INSULIN 8.1 03/26/2019   Lab Results  Component Value Date   TSH 3.360 03/26/2019   Lab Results  Component Value Date   CHOL 152 03/26/2019   HDL 52 03/26/2019   LDLCALC 81 03/26/2019   TRIG 104 03/26/2019   CHOLHDL 2.9 11/20/2018   Lab Results  Component Value Date   WBC 6.7 03/26/2019   HGB 14.4 03/26/2019   HCT 44.1 03/26/2019   MCV 89 03/26/2019   PLT 243 03/26/2019   Lab Results  Component Value Date   IRON 64 03/26/2019   TIBC 301 03/26/2019   FERRITIN 260 (H) 03/26/2019   Obesity Behavioral Intervention:   Approximately 15 minutes were spent on the discussion below.  ASK: We discussed the diagnosis of obesity with Margaret Reese today and Margaret Reese agreed to give [name] permission to discuss obesity behavioral modification therapy today.  ASSESS: Margaret Reese has the diagnosis of obesity and her BMI today is 34.5. Margaret Reese is in the action stage of change.   ADVISE: Margaret Reese was educated on the multiple health risks of obesity as well as the benefit of weight loss to improve her health. She was advised of the need for long term treatment and the importance of lifestyle modifications to improve her current health and to decrease her risk of future health problems.  AGREE: Multiple dietary modification options and treatment options were discussed and Margaret Reese agreed to follow the recommendations documented in the above note.  ARRANGE: Margaret Reese was educated on the importance of frequent visits to treat obesity as outlined per CMS and USPSTF guidelines and agreed to schedule her next follow up appointment today.  Attestation Statements:   Reviewed by clinician on day of visit: allergies, medications, problem list, medical history,  surgical history, family history, social history, and previous encounter notes.  I, Water quality scientist, CMA, am acting as transcriptionist for [name], DO  I have reviewed the above documentation for accuracy and completeness, and I agree with the above. [name], DO

## 2020-03-09 ENCOUNTER — Ambulatory Visit: Payer: Medicare Other | Admitting: Cardiology

## 2020-03-10 DIAGNOSIS — Z950 Presence of cardiac pacemaker: Secondary | ICD-10-CM | POA: Diagnosis not present

## 2020-03-10 DIAGNOSIS — Z45018 Encounter for adjustment and management of other part of cardiac pacemaker: Secondary | ICD-10-CM | POA: Diagnosis not present

## 2020-03-10 DIAGNOSIS — I441 Atrioventricular block, second degree: Secondary | ICD-10-CM | POA: Diagnosis not present

## 2020-03-18 ENCOUNTER — Other Ambulatory Visit: Payer: Self-pay

## 2020-03-18 ENCOUNTER — Ambulatory Visit (INDEPENDENT_AMBULATORY_CARE_PROVIDER_SITE_OTHER): Payer: Medicare Other | Admitting: Family Medicine

## 2020-03-18 ENCOUNTER — Encounter (INDEPENDENT_AMBULATORY_CARE_PROVIDER_SITE_OTHER): Payer: Self-pay | Admitting: Family Medicine

## 2020-03-18 VITALS — BP 141/75 | HR 71 | Temp 97.6°F | Ht 61.0 in | Wt 180.0 lb

## 2020-03-18 DIAGNOSIS — E669 Obesity, unspecified: Secondary | ICD-10-CM

## 2020-03-18 DIAGNOSIS — Z6834 Body mass index (BMI) 34.0-34.9, adult: Secondary | ICD-10-CM

## 2020-03-18 DIAGNOSIS — E8881 Metabolic syndrome: Secondary | ICD-10-CM

## 2020-03-18 DIAGNOSIS — E65 Localized adiposity: Secondary | ICD-10-CM

## 2020-03-18 DIAGNOSIS — I10 Essential (primary) hypertension: Secondary | ICD-10-CM

## 2020-03-19 DIAGNOSIS — Z87440 Personal history of urinary (tract) infections: Secondary | ICD-10-CM | POA: Diagnosis not present

## 2020-03-19 DIAGNOSIS — N200 Calculus of kidney: Secondary | ICD-10-CM | POA: Diagnosis not present

## 2020-03-19 DIAGNOSIS — N281 Cyst of kidney, acquired: Secondary | ICD-10-CM | POA: Diagnosis not present

## 2020-03-19 NOTE — Progress Notes (Signed)
Chief Complaint:   OBESITY Margaret Reese is here to discuss her progress with her obesity treatment plan along with follow-up of her obesity related diagnoses.   Today's visit was #: 16 Starting weight: 212 lbs Starting date: 03/26/2019 Today's weight: 180 lbs Today's date: 03/18/2020 Total lbs lost to date: 32 lbs Body mass index is 34.01 kg/m.  Total weight loss percentage to date: -15.09%  Interim History: Margaret Reese's goal weight is 170 pounds.  RMR has improved 905 - 1427. Nutrition Plan: the Category 1 Plan for 80-85% of the time.  Activity: Cardio/stretching/strength training for 30-45 minutes 6 times per week.  Assessment/Plan:   1. Essential hypertension Elevated today.  Medications: metoprolol 50 mg daily.  Followed by Cardiology.  Home blood pressures within normal limits.  Plan: Avoid buying foods that are: processed, frozen, or prepackaged to avoid excess salt. We will continue to monitor symptoms as they relate to her weight loss journey.  BP Readings from Last 3 Encounters:  03/18/20 (!) 141/75  02/26/20 (!) 143/78  12/05/19 126/72   Lab Results  Component Value Date   CREATININE 0.84 02/26/2020   2. Visceral obesity Current visceral fat rating: 15. Visceral fat rating should be < 13. Visceral adipose tissue is a hormonally active component of total body fat. This body composition phenotype is associated with medical disorders such as metabolic syndrome, cardiovascular disease and several malignancies including prostate, breast, and colorectal cancers. Starting goal: Lose 7-10% of starting weight.   3. Metabolic syndrome Starting goal: Lose 7-10% of starting weight. She will continue to focus on protein-rich, low simple carbohydrate foods. We reviewed the importance of hydration, regular exercise for stress reduction, and restorative sleep.  We will continue to check lab work every 3 months, with 10% weight loss, or should any other concerns arise.  4. Class 1 obesity  with serious comorbidity and body mass index (BMI) of 34.0 to 34.9 in adult, unspecified obesity type  Course: Margaret Reese is currently in the action stage of change. As such, her goal is to continue with weight loss efforts.   Nutrition goals: She has agreed to keeping a food journal and adhering to recommended goals of 1000-1200 calories and 85+ grams of protein.   Exercise goals: As is.  Behavioral modification strategies: increasing lean protein intake, decreasing simple carbohydrates, increasing vegetables and increasing water intake.  Margaret Reese has agreed to follow-up with our clinic in 3 weeks. She was informed of the importance of frequent follow-up visits to maximize her success with intensive lifestyle modifications for her multiple health conditions.   Objective:   Blood pressure (!) 141/75, pulse 71, temperature 97.6 F (36.4 C), temperature source Oral, height 5\' 1"  (1.549 m), weight 180 lb (81.6 kg), SpO2 96 %. Body mass index is 34.01 kg/m.  General: Cooperative, alert, well developed, in no acute distress. HEENT: Conjunctivae and lids unremarkable. Cardiovascular: Regular rhythm.  Lungs: Normal work of breathing. Neurologic: No focal deficits.   Lab Results  Component Value Date   CREATININE 0.84 02/26/2020   BUN 19 02/26/2020   NA 142 02/26/2020   K 4.2 02/26/2020   CL 102 02/26/2020   CO2 25 02/26/2020   Lab Results  Component Value Date   ALT 13 03/26/2019   AST 19 03/26/2019   ALKPHOS 132 (H) 03/26/2019   BILITOT 0.8 03/26/2019   Lab Results  Component Value Date   HGBA1C 5.6 03/26/2019   Lab Results  Component Value Date   INSULIN 8.1 03/26/2019  Lab Results  Component Value Date   TSH 3.360 03/26/2019   Lab Results  Component Value Date   CHOL 152 03/26/2019   HDL 52 03/26/2019   LDLCALC 81 03/26/2019   TRIG 104 03/26/2019   CHOLHDL 2.9 11/20/2018   Lab Results  Component Value Date   WBC 6.7 03/26/2019   HGB 14.4 03/26/2019   HCT 44.1  03/26/2019   MCV 89 03/26/2019   PLT 243 03/26/2019   Lab Results  Component Value Date   IRON 64 03/26/2019   TIBC 301 03/26/2019   FERRITIN 260 (H) 03/26/2019   Obesity Behavioral Intervention:   Approximately 15 minutes were spent on the discussion below.  ASK: We discussed the diagnosis of obesity with Tanesia today and Misaki agreed to give [name] permission to discuss obesity behavioral modification therapy today.  ASSESS: Ismerai has the diagnosis of obesity and her BMI today is 34.1. Lysandra is in the action stage of change.   ADVISE: Tamela was educated on the multiple health risks of obesity as well as the benefit of weight loss to improve her health. She was advised of the need for long term treatment and the importance of lifestyle modifications to improve her current health and to decrease her risk of future health problems.  AGREE: Multiple dietary modification options and treatment options were discussed and Callia agreed to follow the recommendations documented in the above note.  ARRANGE: Kash was educated on the importance of frequent visits to treat obesity as outlined per CMS and USPSTF guidelines and agreed to schedule her next follow up appointment today.  Attestation Statements:   Reviewed by clinician on day of visit: allergies, medications, problem list, medical history, surgical history, family history, social history, and previous encounter notes.  I, Water quality scientist, CMA, am acting as transcriptionist for [name], DO  I have reviewed the above documentation for accuracy and completeness, and I agree with the above. [name], DO

## 2020-03-23 DIAGNOSIS — I10 Essential (primary) hypertension: Secondary | ICD-10-CM | POA: Diagnosis not present

## 2020-04-02 ENCOUNTER — Other Ambulatory Visit: Payer: Self-pay

## 2020-04-02 ENCOUNTER — Encounter: Payer: Self-pay | Admitting: Cardiology

## 2020-04-02 ENCOUNTER — Ambulatory Visit: Payer: Medicare Other | Admitting: Cardiology

## 2020-04-02 VITALS — BP 164/85 | HR 79 | Temp 98.0°F | Resp 17 | Ht 61.0 in | Wt 185.4 lb

## 2020-04-02 DIAGNOSIS — I441 Atrioventricular block, second degree: Secondary | ICD-10-CM

## 2020-04-02 DIAGNOSIS — E782 Mixed hyperlipidemia: Secondary | ICD-10-CM | POA: Diagnosis not present

## 2020-04-02 DIAGNOSIS — Z950 Presence of cardiac pacemaker: Secondary | ICD-10-CM

## 2020-04-02 DIAGNOSIS — I10 Essential (primary) hypertension: Secondary | ICD-10-CM

## 2020-04-02 DIAGNOSIS — Z6835 Body mass index (BMI) 35.0-35.9, adult: Secondary | ICD-10-CM | POA: Diagnosis not present

## 2020-04-02 NOTE — Progress Notes (Signed)
Margaret Reese Date of Birth: 06-28-1945 MRN: [id number] Primary Care Provider:Lalonde, Elyse Jarvis, MD Former Cardiology Providers: Dr. Doylene Canard, Dr. Terrence Dupont, Dr. Einar Gip, and Jeri Lager, APRN, FNP-C. Primary Cardiologist: Rex Kras, DO, [facility] (established care 07/25/2019) Electrophysiologist: Dr. Allegra Lai  Date: 04/02/20 Last Office Visit: 08/28/2019  Chief Complaint  Patient presents with  . Follow-up  . Hypertension    6 MONTH    HPI  Margaret Reese is a 74 y.o.  female who presents to the office with a chief complaint of " blood pressure management and review test results." Patient's past medical history and cardiovascular risk factors include: Hypertension, secondary AV block status post pacemaker, hyperlipidemia, postmenopausal female, advanced age, obesity.  Patient is here for 89-month follow-up for blood pressure management.  Recently she has worked effortlessly with lifestyle modifications and increasing physical activity has lost approximately 40 pounds and was doing well from a cardiovascular standpoint she was requested to follow-up in 6 months.  Over the last 6 months patient states that she is doing well from a cardiovascular standpoint.  Denies any chest pain or heart failure symptoms.  However, patient states that she feels more tired, fatigue, decreased energy.  She states that " I do not feel like myself and am not as motivated."  She states that she has been forcing herself to exercise.  She is also lost 5-6 of her dear friends recently.  She denies any suicidal or homicidal ideations.  Her most recent remote transmission reviewed with her during today's encounter.  Office blood pressures are not at goal however home blood pressures are within excellent control.  Her ambulatory blood pressure monitoring results reviewed.  Average blood pressures less than 350 mmHg systolic.  FUNCTIONAL STATUS: She does floor exercise for 77minutes a day. She is enrolled in  wellness program and has lost 40 pounds since January 2021.    ALLERGIES: No Known Allergies   MEDICATION LIST PRIOR TO VISIT: Current Outpatient Medications on File Prior to Visit  Medication Sig Dispense Refill  . acetaminophen (TYLENOL) 325 MG tablet Take 1-2 tablets (325-650 mg total) by mouth every 4 (four) hours as needed for mild pain.    Marland Kitchen aspirin 81 MG tablet Take 81 mg by mouth daily.    . Cholecalciferol (VITAMIN D3) 1000 UNITS CAPS Take 2 capsules by mouth every evening.    . fish oil-omega-3 fatty acids 1000 MG capsule Take 1 g by mouth daily. Taking 1000mg     . magnesium oxide (MAG-OX) 400 MG tablet Take 400 mg by mouth daily.    . Multiple Vitamins-Minerals (PRESERVISION AREDS 2) CAPS Take 1 capsule by mouth 2 (two) times daily.     Marland Kitchen olmesartan (BENICAR) 20 MG tablet Take 1 tablet (20 mg total) by mouth daily. 90 tablet 0  . potassium citrate (UROCIT-K) 10 MEQ (1080 MG) SR tablet Take 10 mEq by mouth 2 (two) times daily.    . simvastatin (ZOCOR) 20 MG tablet TAKE 1 (ONE)TABLET DAILY IN THE EVENING 90 tablet 0  . Albuterol Sulfate (PROAIR RESPICLICK IN) Inhale into the lungs.      No current facility-administered medications on file prior to visit.    PAST MEDICAL HISTORY: Past Medical History:  Diagnosis Date  . Allergy   . Anxiety   . Arthritis   . Asthma   . Complication of anesthesia    pt states has difficulty awakening; also has increased sinus drainage  . Constipation   . Edema, lower extremity   .  Encounter for interrogation of cardiac pacemaker 09/05/2018  . Falls   . GERD (gastroesophageal reflux disease)   . H/O back injury   . History of bronchitis   . History of colon polyps   . Hypertension   . Imbalance   . Kidney cysts    pt states not sure which kidney does see kidney specialist yearly pt states every thing okay currently   . Legally blind in right eye, as defined in [place]   . Mobitz type II atrioventricular block 11/08/2016  . Multiple  gastric ulcers   . Obesity   . Pacemaker S/P [place] MRI model [id number] 05/20/2016  . Renal stone   . Retinal vein occlusion   . Shortness of breath   . Sinus node dysfunction ([place]) 12/16/2018  . Tingling    left arm   . Trace cataracts   . Urinary frequency   . Vitamin D deficiency     PAST SURGICAL HISTORY: Past Surgical History:  Procedure Laterality Date  . CARDIOVASCULAR STRESS TEST  dec 2016  . CHOLECYSTECTOMY    . COLONOSCOPY  2011  . EYE SURGERY Bilateral    Cataract surgery.   Marland Kitchen LEFT HEART CATH AND CORONARY ANGIOGRAPHY N/A 05/19/2016   Procedure: Left Heart Cath and Coronary Angiography;  Surgeon: Dixie Dials, MD;  Location: [facility];  Service: Cardiovascular;  Laterality: N/A;  . PACEMAKER IMPLANT N/A 05/20/2016   Procedure: Pacemaker Implant;  Surgeon: Will Meredith Leeds, MD;  Location: [facility];  Service: Cardiovascular;  Laterality: N/A;  . TONSILLECTOMY    . TOTAL HIP ARTHROPLASTY Right 01/30/2015   Procedure: RIGHT TOTAL HIP ARTHROPLASTY ANTERIOR APPROACH AND REMOVAL LIPOMA RIGHT HIP;  Surgeon: Mcarthur Rossetti, MD;  Location: [facility];  Service: Orthopedics;  Laterality: Right;  . UPPER GI ENDOSCOPY      FAMILY HISTORY: The patient's family history includes Asthma in her brother; Bipolar disorder in her mother; Depression in her mother; Eating disorder in her mother; Kidney disease in her mother; Liver disease in her mother; Obesity in her mother; Stroke in her mother.   SOCIAL HISTORY:  The patient  reports that she has never smoked. She has never used smokeless tobacco. She reports that she does not drink alcohol and does not use drugs.  Review of Systems  Constitutional: Negative for chills and fever.  HENT: Negative for hoarse voice and nosebleeds.   Eyes: Negative for discharge, double vision and pain.  Cardiovascular: Negative for chest pain, claudication, dyspnea on exertion, leg swelling, near-syncope, orthopnea,  palpitations, paroxysmal nocturnal dyspnea and syncope.  Respiratory: Negative for hemoptysis and shortness of breath.   Musculoskeletal: Negative for muscle cramps and myalgias.  Gastrointestinal: Negative for abdominal pain, constipation, diarrhea, hematemesis, hematochezia, melena, nausea and vomiting.  Neurological: Positive for dizziness. Negative for light-headedness.  Psychiatric/Behavioral: Negative for altered mental status, depression, memory loss and suicidal ideas.       Not as motivated, "not me," decreased energy.     PHYSICAL EXAM: Vitals with BMI 04/02/2020 04/02/2020 03/18/2020  Height - 5\' 1"  5\' 1"   Weight - 185 lbs 6 oz 180 lbs  BMI - 47.42 59.56  Systolic 387 564 332  Diastolic 85 89 75  Pulse 79 91 71    CONSTITUTIONAL: Well-developed and well-nourished. No acute distress.  SKIN: Skin is warm and dry. No rash noted. No cyanosis. No pallor. No jaundice HEAD: Normocephalic and atraumatic.  EYES: No scleral icterus MOUTH/THROAT: Moist oral membranes.  NECK: No JVD present. No thyromegaly noted. LYMPHATIC: No visible cervical adenopathy.  CHEST Normal respiratory effort. No intercostal retractions.  Pacemaker site is clean dry and intact (left infraclavicular region). LUNGS: Clear to auscultation bilaterally. No stridor. No wheezes. No rales.  CARDIOVASCULAR: Regular rate and rhythm, positive S1-S2, no murmurs rubs or gallops appreciated. ABDOMINAL: No apparent ascites.  EXTREMITIES: No peripheral edema  HEMATOLOGIC: No significant bruising NEUROLOGIC: Oriented to person, place, and time. Nonfocal. Normal muscle tone.  PSYCHIATRIC: Normal mood and affect. Normal behavior. Cooperative  CARDIAC DATABASE: Pacemaker in situ: [place] MRI  dual-chamber pacemaker for symptomatic bradycardia in March 2018 Scheduled Remote pacemaker check 06/11/2019:  There was 2 atrial high rate episodes detected. The longest lasted 00:00:00:10 in duration. There was a 1 %  cumulative atrial arrhythmia burden. EGM suggests PAC/AT. There were 0 high ventricular rate episodes detected. Health trends do not demonstrate significant abnormality. Battery longevity is 8.5 years. RA pacing is 35.0 %, RV pacing is 99.0 %.  EKG: 04/02/2020: Atrial sensed and ventricular paced rhythm,  83bpm.   Echocardiogram: 08/22/2019: LVEF 55 to 60%, moderate LVH, grade 1 diastolic impairment, elevated LAP, pacemaker/ICD lead noted in the RV, mild MR, moderate TR, mild pulmonary hypertension with RVSP 39 mmHg.  Stress Testing:  January 23, 2015:Myocardial perfusion imaging is normal. Overall left ventricular systolic function was normal without regional wall motion abnormalities. The left ventricular ejection fraction was 78%. This is a normal/low risk study.  Heart Catheterization: 04/2016 Dr. Dixie Dials: Normal coronaries  Scheduled Remote pacemaker check 03/10/2020:  There were 1 AHR episodes.  Brief 4 Sec PAT. There were 0 high ventricular rate episodes detected. Health trends do not demonstrate significant abnormality. Battery longevity is 8.5 years. RA pacing is 29.0 %, RV pacing is 99.0 %.  LABORATORY DATA: CBC Latest Ref Rng & Units 03/26/2019 11/20/2018 11/09/2017  WBC 3.4 - 10.8 x10E3/uL 6.7 5.8 7.1  Hemoglobin 11.1 - 15.9 g/dL 14.4 12.7 13.5  Hematocrit 34.0 - 46.6 % 44.1 39.7 40.2  Platelets 150 - 450 x10E3/uL 243 201 232    CMP Latest Ref Rng & Units 02/26/2020 08/08/2019 03/26/2019  Glucose 65 - 99 mg/dL 87 80 86  BUN 8 - 27 mg/dL 19 18 19   Creatinine 0.57 - 1.00 mg/dL 0.84 0.85 0.86  Sodium 134 - 144 mmol/L 142 143 143  Potassium 3.5 - 5.2 mmol/L 4.2 4.2 4.0  Chloride 96 - 106 mmol/L 102 103 99  CO2 20 - 29 mmol/L 25 24 27   Calcium 8.7 - 10.3 mg/dL 10.2 10.1 10.6(H)  Total Protein 6.0 - 8.5 g/dL - - 7.5  Total Bilirubin 0.0 - 1.2 mg/dL - - 0.8  Alkaline Phos 39 - 117 IU/L - - 132(H)  AST 0 - 40 IU/L - - 19  ALT 0 - 32 IU/L - - 13    Lipid Panel      Component Value Date/Time   CHOL 152 03/26/2019 1502   TRIG 104 03/26/2019 1502   HDL 52 03/26/2019 1502   CHOLHDL 2.9 11/20/2018 0919   CHOLHDL 2.6 11/08/2016 1035   VLDL 20 09/15/2015 0914   LDLCALC 81 03/26/2019 1502   LDLCALC 75 11/08/2016 1035   LABVLDL 19 03/26/2019 1502    Lab Results  Component Value Date   HGBA1C 5.6 03/26/2019   No components found for: NTPROBNP Lab Results  Component Value Date   TSH 3.360 03/26/2019   TSH 1.360 05/19/2016    Cardiac Panel (last 3  results) No results for input(s): CKTOTAL, CKMB, TROPONINIHS, RELINDX in the last 72 hours.  IMPRESSION:    ICD-10-CM   1. Benign hypertension  I10 EKG 12-Lead  2. Mobitz type 2 second degree AV block  I44.1   3. Pacemaker S/P [employer] MRI model [id number]  Z95.0   4. Mixed hyperlipidemia  E78.2   5. Class 2 severe obesity due to excess calories with serious comorbidity and body mass index (BMI) of 35.0 to 35.9 in adult [facility])  E66.01    Z68.35      RECOMMENDATIONS: Margaret Reese is a 75 y.o. female whose past medical history and cardiovascular risk factors include: Hypertension, secondary AV block status post pacemaker, hyperlipidemia, postmenopausal female, advanced age, obesity.  Benign essential hypertension:  Office blood pressure not well controlled.  Ambulatory blood pressure monitoring results reviewed.  Home blood pressures are very well controlled.    I suspect that the patient is mood, and decreased motivation is multifactorial and she has lost very close friends in the recent past.  In addition patient has been on Toprol-XL even prior to establishing care with myself.  Given her symptoms that shared decision was to wean off Toprol-XL.  Patient is asked to take Toprol-XL 25 mg p.o. daily for 1 week.  Week 2 take Toprol-XL 25 mg every other day.  Week 3 stop.    We will keep a closer check on her blood pressures since the medications have been titrated.    I will see  her back in 4 weeks for further evaluation.    In the meantime patient is asked to discuss her symptoms with her primary care provider to rule out any organic causes such as depression.    Independently reviewed labs from 02/26/2020.   History of Mobitz type II AV block status post pacemaker implantation:  Pacemaker site is well-healed and clean dry and intact.  Most recent pacemaker report reviewed with the patient.  Findings noted above for further reference.  Mixed hyperlipidemia: Continue statin therapy.  Patient does not endorse any myalgias.  FINAL MEDICATION LIST END OF ENCOUNTER: No orders of the defined types were placed in this encounter.   Medications Discontinued During This Encounter  Medication Reason  . nystatin (MYCOSTATIN/NYSTOP) powder No longer needed (for PRN medications)  . metoprolol succinate (TOPROL-XL) 50 MG 24 hr tablet Change in therapy     Current Outpatient Medications:  .  acetaminophen (TYLENOL) 325 MG tablet, Take 1-2 tablets (325-650 mg total) by mouth every 4 (four) hours as needed for mild pain., Disp: , Rfl:  .  aspirin 81 MG tablet, Take 81 mg by mouth daily., Disp: , Rfl:  .  Cholecalciferol (VITAMIN D3) 1000 UNITS CAPS, Take 2 capsules by mouth every evening., Disp: , Rfl:  .  fish oil-omega-3 fatty acids 1000 MG capsule, Take 1 g by mouth daily. Taking 1000mg , Disp: , Rfl:  .  magnesium oxide (MAG-OX) 400 MG tablet, Take 400 mg by mouth daily., Disp: , Rfl:  .  Multiple Vitamins-Minerals (PRESERVISION AREDS 2) CAPS, Take 1 capsule by mouth 2 (two) times daily. , Disp: , Rfl:  .  olmesartan (BENICAR) 20 MG tablet, Take 1 tablet (20 mg total) by mouth daily., Disp: 90 tablet, Rfl: 0 .  potassium citrate (UROCIT-K) 10 MEQ (1080 MG) SR tablet, Take 10 mEq by mouth 2 (two) times daily., Disp: , Rfl:  .  simvastatin (ZOCOR) 20 MG tablet, TAKE 1 (ONE)TABLET DAILY IN THE EVENING,  Disp: 90 tablet, Rfl: 0 .  Albuterol Sulfate (PROAIR RESPICLICK IN),  Inhale into the lungs. , Disp: , Rfl:   Orders Placed This Encounter  Procedures  . EKG 12-Lead   --Continue cardiac medications as reconciled in final medication list. --Return in about 4 weeks (around 04/30/2020) for Follow up, BP, stopped BB. . Or sooner if needed. --Continue follow-up with your primary care physician regarding the management of your other chronic comorbid conditions.  Patient's questions and concerns were addressed to her satisfaction. She voices understanding of the instructions provided during this encounter.   This note was created using a voice recognition software as a result there may be grammatical errors inadvertently enclosed that do not reflect the nature of this encounter. Every attempt is made to correct such errors.  Rex Kras, [place], [facility]  Pager: [phone] Office: [phone]

## 2020-04-08 ENCOUNTER — Other Ambulatory Visit: Payer: Self-pay

## 2020-04-08 ENCOUNTER — Encounter (INDEPENDENT_AMBULATORY_CARE_PROVIDER_SITE_OTHER): Payer: Self-pay | Admitting: Family Medicine

## 2020-04-08 ENCOUNTER — Ambulatory Visit (INDEPENDENT_AMBULATORY_CARE_PROVIDER_SITE_OTHER): Payer: Medicare Other | Admitting: Family Medicine

## 2020-04-08 VITALS — BP 139/85 | HR 82 | Temp 97.3°F | Ht 61.0 in | Wt 182.0 lb

## 2020-04-08 DIAGNOSIS — E7849 Other hyperlipidemia: Secondary | ICD-10-CM | POA: Diagnosis not present

## 2020-04-08 DIAGNOSIS — E669 Obesity, unspecified: Secondary | ICD-10-CM

## 2020-04-08 DIAGNOSIS — Z6834 Body mass index (BMI) 34.0-34.9, adult: Secondary | ICD-10-CM

## 2020-04-08 DIAGNOSIS — M158 Other polyosteoarthritis: Secondary | ICD-10-CM

## 2020-04-08 DIAGNOSIS — I10 Essential (primary) hypertension: Secondary | ICD-10-CM

## 2020-04-08 DIAGNOSIS — F3289 Other specified depressive episodes: Secondary | ICD-10-CM

## 2020-04-13 NOTE — Progress Notes (Signed)
Chief Complaint:   OBESITY Margaret Reese is here to discuss her progress with her obesity treatment plan along with follow-up of her obesity related diagnoses.   Today's visit was #: 17 Starting weight: 212 lbs Starting date: 03/26/2019 Today's weight: 182 lbs Today's date: 04/08/2020 Total lbs lost to date: 30 Body mass index is 34.39 kg/m.  Total weight loss percentage to date: -14.15%  Interim History: Margaret Reese is still checking her blood pressure at home, 100s. She has decreased her water. She says she is sleep better. She is due to see Dr. Redmond School. Her canine, upper left, broke yesterday and was repaired. She says "we're going to the mountains" this weekend. RMR is 1427.  Nutrition Plan:  keeping a food journal and adhering to recommended goals of 1000-1200 calories and 85+ grams of protein 30% of the time. Activity: Stretch/areobics for 40-45 minutes 6-7 times per week  Assessment/Plan:   1. Essential hypertension Improving, but not optimized. Medications: Metoprolol and Benicar. Cardiology note reviewed. Weaning her off Metoprolol to see if it helps lack of motivation, apathy.   Plan: Avoid buying foods that are: processed, frozen, or prepackaged to avoid excess salt. We will continue to monitor closely alongside her PCP and/or Specialist.  Regular follow up with PCP and specialists was also encouraged.   BP Readings from Last 3 Encounters:  04/08/20 139/85  04/02/20 (!) 164/85  03/18/20 (!) 141/75   Lab Results  Component Value Date   CREATININE 0.84 02/26/2020   2. Other hyperlipidemia Course: At goal. Lipid-lowering medications: Zocor.   Plan: Dietary changes: Increase soluble fiber, decrease simple carbohydrates, decrease saturated fat. Exercise changes: Moderate to vigorous-intensity aerobic activity 150 minutes per week or as tolerated. We will continue to monitor along with PCP/specialists as it pertains to her weight loss journey.  Lab Results  Component Value  Date   CHOL 152 03/26/2019   HDL 52 03/26/2019   LDLCALC 81 03/26/2019   TRIG 104 03/26/2019   CHOLHDL 2.9 11/20/2018   Lab Results  Component Value Date   ALT 13 03/26/2019   AST 19 03/26/2019   ALKPHOS 132 (H) 03/26/2019   BILITOT 0.8 03/26/2019   The 10-year ASCVD risk score Mikey Bussing DC Jr., et al., 2013) is: 23.8%   Values used to calculate the score:     Age: 36 years     Sex: Female     Is Non-Hispanic African American: No     Diabetic: No     Tobacco smoker: No     Systolic Blood Pressure: 998 mmHg     Is BP treated: Yes     HDL Cholesterol: 52 mg/dL     Total Cholesterol: 152 mg/dL  3. Other osteoarthritis involving multiple joints Continue exercise.   4. Other depression, with emotional eating  Behavior modification techniques were discussed today to help deal with emotional/non-hunger eating behaviors. Discussed Wellbutrin is not improving.  5. Class 1 obesity with serious comorbidity and body mass index (BMI) of 34.0 to 34.9 in adult, unspecified obesity type Course: Margaret Reese is currently in the action stage of change. As such, her goal is to continue with weight loss efforts.   Nutrition goals: She has agreed to keeping a food journal and adhering to recommended goals of 1000-1200 calories and 85 grams of protein.   Exercise goals: As is.  Behavioral modification strategies: increasing lean protein intake, decreasing simple carbohydrates, increasing vegetables, increasing water intake and decreasing liquid calories.  Margaret Reese has agreed to follow-up  with our clinic in 4 weeks. She was informed of the importance of frequent follow-up visits to maximize her success with intensive lifestyle modifications for her multiple health conditions.   Objective:   Blood pressure 139/85, pulse 82, temperature (!) 97.3 F (36.3 C), temperature source Oral, height 5\' 1"  (1.549 m), weight 182 lb (82.6 kg), SpO2 98 %. Body mass index is 34.39 kg/m.  General: Cooperative, alert,  well developed, in no acute distress. HEENT: Conjunctivae and lids unremarkable. Cardiovascular: Regular rhythm.  Lungs: Normal work of breathing. Neurologic: No focal deficits.   Lab Results  Component Value Date   CREATININE 0.84 02/26/2020   BUN 19 02/26/2020   NA 142 02/26/2020   K 4.2 02/26/2020   CL 102 02/26/2020   CO2 25 02/26/2020   Lab Results  Component Value Date   ALT 13 03/26/2019   AST 19 03/26/2019   ALKPHOS 132 (H) 03/26/2019   BILITOT 0.8 03/26/2019   Lab Results  Component Value Date   HGBA1C 5.6 03/26/2019   Lab Results  Component Value Date   INSULIN 8.1 03/26/2019   Lab Results  Component Value Date   TSH 3.360 03/26/2019   Lab Results  Component Value Date   CHOL 152 03/26/2019   HDL 52 03/26/2019   LDLCALC 81 03/26/2019   TRIG 104 03/26/2019   CHOLHDL 2.9 11/20/2018   Lab Results  Component Value Date   WBC 6.7 03/26/2019   HGB 14.4 03/26/2019   HCT 44.1 03/26/2019   MCV 89 03/26/2019   PLT 243 03/26/2019   Lab Results  Component Value Date   IRON 64 03/26/2019   TIBC 301 03/26/2019   FERRITIN 260 (H) 03/26/2019    Obesity Behavioral Intervention:   Approximately 15 minutes were spent on the discussion below.  ASK: We discussed the diagnosis of obesity with Orlena today and Delaina agreed to give [name] permission to discuss obesity behavioral modification therapy today.  ASSESS: Kaylany has the diagnosis of obesity and her BMI today is 34.4. Shallyn is in the action stage of change.   ADVISE: Margaret Reese was educated on the multiple health risks of obesity as well as the benefit of weight loss to improve her health. She was advised of the need for long term treatment and the importance of lifestyle modifications to improve her current health and to decrease her risk of future health problems.  AGREE: Multiple dietary modification options and treatment options were discussed and Margaret Reese agreed to follow the recommendations documented in the  above note.  ARRANGE: Margaret Reese was educated on the importance of frequent visits to treat obesity as outlined per CMS and USPSTF guidelines and agreed to schedule her next follow up appointment today.  Attestation Statements:   Reviewed by clinician on day of visit: allergies, medications, problem list, medical history, surgical history, family history, social history, and previous encounter notes.  Leodis Binet Friedenbach, CMA, am acting as Location manager for [employer], DO.  I have reviewed the above documentation for accuracy and completeness, and I agree with the above. [name], DO

## 2020-04-23 DIAGNOSIS — I10 Essential (primary) hypertension: Secondary | ICD-10-CM | POA: Diagnosis not present

## 2020-05-01 ENCOUNTER — Encounter: Payer: Self-pay | Admitting: Cardiology

## 2020-05-01 ENCOUNTER — Ambulatory Visit: Payer: Medicare Other | Admitting: Cardiology

## 2020-05-01 ENCOUNTER — Other Ambulatory Visit: Payer: Self-pay

## 2020-05-01 ENCOUNTER — Other Ambulatory Visit: Payer: Self-pay | Admitting: Cardiology

## 2020-05-01 VITALS — BP 151/72 | HR 93 | Temp 97.9°F | Resp 16 | Ht 61.0 in | Wt 182.0 lb

## 2020-05-01 DIAGNOSIS — I10 Essential (primary) hypertension: Secondary | ICD-10-CM

## 2020-05-01 DIAGNOSIS — E6609 Other obesity due to excess calories: Secondary | ICD-10-CM | POA: Diagnosis not present

## 2020-05-01 DIAGNOSIS — Z6834 Body mass index (BMI) 34.0-34.9, adult: Secondary | ICD-10-CM | POA: Diagnosis not present

## 2020-05-01 DIAGNOSIS — E782 Mixed hyperlipidemia: Secondary | ICD-10-CM

## 2020-05-01 DIAGNOSIS — R002 Palpitations: Secondary | ICD-10-CM

## 2020-05-01 DIAGNOSIS — I441 Atrioventricular block, second degree: Secondary | ICD-10-CM

## 2020-05-01 DIAGNOSIS — T887XXA Unspecified adverse effect of drug or medicament, initial encounter: Secondary | ICD-10-CM | POA: Diagnosis not present

## 2020-05-01 DIAGNOSIS — Z950 Presence of cardiac pacemaker: Secondary | ICD-10-CM

## 2020-05-01 MED ORDER — DILTIAZEM HCL ER COATED BEADS 120 MG PO CP24: 120.0000 mg | ORAL_CAPSULE | Freq: Every day | ORAL | 0 refills | Status: DC

## 2020-05-01 NOTE — Progress Notes (Signed)
Margaret Reese Date of Birth: 07-Jan-1946 MRN: [id number] Primary Care Provider:Lalonde, Elyse Jarvis, MD Former Cardiology Providers: Dr. Doylene Canard, Dr. Terrence Dupont, Dr. Einar Gip, and Jeri Lager, APRN, FNP-C. Primary Cardiologist: Rex Kras, DO, [facility] (established care 07/25/2019) Electrophysiologist: Dr. Allegra Lai  Date: 05/01/20 Last Office Visit: 04/02/2020  Chief Complaint  Patient presents with  . Hypertension  . Follow-up    4 weeks    HPI  Margaret Reese is a 75 y.o.  female who presents to the office with a chief complaint of " 1 month follow-up for management of blood pressure and changes in medication." Patient's past medical history and cardiovascular risk factors include: Hypertension, secondary AV block status post pacemaker, hyperlipidemia, postmenopausal female, advanced age, obesity.  Patient is followed by our practice given her underlying hypertension and status post pacemaker implantation due to second-degree AV block.  Patient is here for 1 month follow-up to reevaluate her symptoms after discontinuation of beta-blockers.  During the last office visit patient stated that she feels more tired, fatigued, has decreased energy.  She stated that " I do not feel like myself and am not as motivated."  Her medication profile was reviewed with her extensively at the last visit and the shared decision was to wean off of Toprol-XL and to follow-up in 1 month.  Patient has successfully weaned herself of Toprol-XL and states that her mood has improved significantly.  She states that it is the day and night difference.  She no longer has a symptoms of feeling tired, fatigue, decreased energy.  However, she has been experiencing symptoms of palpitation and is concerned that she may have atrial fibrillation.  Based on the last pacemaker interrogation report she did not have evidence of mode switching or atrial fibrillation.  But she does have paroxysmal atrial tachycardia.  Patient is  office blood pressures are not well controlled.  However patient states that her home blood pressures are well controlled.  She checks her blood pressures on a regular basis.  She states that her morning blood pressure today was 110/60 with a pulse of 82.  FUNCTIONAL STATUS: She does floor exercise for 37minutes a day. She is enrolled in wellness program and has lost 40 pounds since January 2021.    ALLERGIES: Allergies  Allergen Reactions  . Toprol Xl [Metoprolol Tartrate]     Depressive mood.      MEDICATION LIST PRIOR TO VISIT: Current Outpatient Medications on File Prior to Visit  Medication Sig Dispense Refill  . acetaminophen (TYLENOL) 325 MG tablet Take 1-2 tablets (325-650 mg total) by mouth every 4 (four) hours as needed for mild pain.    Marland Kitchen aspirin 81 MG tablet Take 81 mg by mouth daily.    . Cholecalciferol (VITAMIN D3) 1000 UNITS CAPS Take 2 capsules by mouth every evening.    . fish oil-omega-3 fatty acids 1000 MG capsule Take 1 g by mouth daily. Taking 1000mg     . magnesium oxide (MAG-OX) 400 MG tablet Take 400 mg by mouth daily.    . Multiple Vitamins-Minerals (PRESERVISION AREDS 2) CAPS Take 1 capsule by mouth 2 (two) times daily.     Marland Kitchen olmesartan (BENICAR) 20 MG tablet Take 1 tablet (20 mg total) by mouth daily. 90 tablet 0  . potassium citrate (UROCIT-K) 10 MEQ (1080 MG) SR tablet Take 10 mEq by mouth 2 (two) times daily.    . simvastatin (ZOCOR) 20 MG tablet TAKE 1 (ONE)TABLET DAILY IN THE EVENING 90 tablet 0  No current facility-administered medications on file prior to visit.    PAST MEDICAL HISTORY: Past Medical History:  Diagnosis Date  . Allergy   . Anxiety   . Arthritis   . Asthma   . Complication of anesthesia    pt states has difficulty awakening; also has increased sinus drainage  . Constipation   . Edema, lower extremity   . Encounter for interrogation of cardiac pacemaker 09/05/2018  . Falls   . GERD (gastroesophageal reflux disease)   . H/O back  injury   . History of bronchitis   . History of colon polyps   . Hypertension   . Imbalance   . Kidney cysts    pt states not sure which kidney does see kidney specialist yearly pt states every thing okay currently   . Legally blind in right eye, as defined in [place]   . Mobitz type II atrioventricular block 11/08/2016  . Multiple gastric ulcers   . Obesity   . Pacemaker S/P Holt MRI model [id number] 05/20/2016  . Renal stone   . Retinal vein occlusion   . Shortness of breath   . Sinus node dysfunction ([place]) 12/16/2018  . Tingling    left arm   . Trace cataracts   . Urinary frequency   . Vitamin D deficiency     PAST SURGICAL HISTORY: Past Surgical History:  Procedure Laterality Date  . CARDIOVASCULAR STRESS TEST  dec 2016  . CHOLECYSTECTOMY    . COLONOSCOPY  2011  . EYE SURGERY Bilateral    Cataract surgery.   Marland Kitchen LEFT HEART CATH AND CORONARY ANGIOGRAPHY N/A 05/19/2016   Procedure: Left Heart Cath and Coronary Angiography;  Surgeon: Dixie Dials, MD;  Location: [facility];  Service: Cardiovascular;  Laterality: N/A;  . PACEMAKER IMPLANT N/A 05/20/2016   Procedure: Pacemaker Implant;  Surgeon: Will Meredith Leeds, MD;  Location: [facility];  Service: Cardiovascular;  Laterality: N/A;  . TONSILLECTOMY    . TOTAL HIP ARTHROPLASTY Right 01/30/2015   Procedure: RIGHT TOTAL HIP ARTHROPLASTY ANTERIOR APPROACH AND REMOVAL LIPOMA RIGHT HIP;  Surgeon: Mcarthur Rossetti, MD;  Location: [facility];  Service: Orthopedics;  Laterality: Right;  . UPPER GI ENDOSCOPY      FAMILY HISTORY: The patient's family history includes Asthma in her brother; Bipolar disorder in her mother; Depression in her mother; Eating disorder in her mother; Kidney disease in her mother; Liver disease in her mother; Obesity in her mother; Stroke in her mother.   SOCIAL HISTORY:  The patient  reports that she has never smoked. She has never used smokeless tobacco. She reports that she does  not drink alcohol and does not use drugs.  Review of Systems  Constitutional: Negative for chills and fever.  HENT: Negative for hoarse voice and nosebleeds.   Eyes: Negative for discharge, double vision and pain.  Cardiovascular: Negative for chest pain, claudication, dyspnea on exertion, leg swelling, near-syncope, orthopnea, palpitations, paroxysmal nocturnal dyspnea and syncope.  Respiratory: Negative for hemoptysis and shortness of breath.   Musculoskeletal: Negative for muscle cramps and myalgias.  Gastrointestinal: Negative for abdominal pain, constipation, diarrhea, hematemesis, hematochezia, melena, nausea and vomiting.  Neurological: Negative for dizziness and light-headedness.  Psychiatric/Behavioral: Negative for altered mental status, depression, memory loss and suicidal ideas.    PHYSICAL EXAM: Vitals with BMI 05/01/2020 04/08/2020 04/02/2020  Height 5\' 1"  5\' 1"  -  Weight 182 lbs 182 lbs -  BMI 87.56 43.32 -  Systolic 951 884  161  Diastolic 72 85 85  Pulse 93 82 79    CONSTITUTIONAL: Well-developed and well-nourished. No acute distress.  SKIN: Skin is warm and dry. No rash noted. No cyanosis. No pallor. No jaundice HEAD: Normocephalic and atraumatic.  EYES: No scleral icterus MOUTH/THROAT: Moist oral membranes.  NECK: No JVD present. No thyromegaly noted. LYMPHATIC: No visible cervical adenopathy.  CHEST Normal respiratory effort. No intercostal retractions.  Pacemaker site is clean dry and intact (left infraclavicular region). LUNGS: Clear to auscultation bilaterally. No stridor. No wheezes. No rales.  CARDIOVASCULAR: Regular rate and rhythm, positive S1-S2, no murmurs rubs or gallops appreciated. ABDOMINAL: No apparent ascites.  EXTREMITIES: No peripheral edema  HEMATOLOGIC: No significant bruising NEUROLOGIC: Oriented to person, place, and time. Nonfocal. Normal muscle tone.  PSYCHIATRIC: Normal mood and affect. Normal behavior. Cooperative  CARDIAC  DATABASE: Pacemaker in situ: [employer] MRI  dual-chamber pacemaker for symptomatic bradycardia in March 2018 Scheduled Remote pacemaker check 06/11/2019:  There was 2 atrial high rate episodes detected. The longest lasted 00:00:00:10 in duration. There was a 1 % cumulative atrial arrhythmia burden. EGM suggests PAC/AT. There were 0 high ventricular rate episodes detected. Health trends do not demonstrate significant abnormality. Battery longevity is 8.5 years. RA pacing is 35.0 %, RV pacing is 99.0 %.  EKG: 04/02/2020: Atrial sensed and ventricular paced rhythm,  83bpm.   Echocardiogram: 08/22/2019: LVEF 55 to 60%, moderate LVH, grade 1 diastolic impairment, elevated LAP, pacemaker/ICD lead noted in the RV, mild MR, moderate TR, mild pulmonary hypertension with RVSP 39 mmHg.  Stress Testing:  January 23, 2015:Myocardial perfusion imaging is normal. Overall left ventricular systolic function was normal without regional wall motion abnormalities. The left ventricular ejection fraction was 78%. This is a normal/low risk study.  Heart Catheterization: 04/2016 Dr. Dixie Dials: Normal coronaries  Scheduled Remote pacemaker check 03/10/2020:  There were 1 AHR episodes.  Brief 4 Sec PAT. There were 0 high ventricular rate episodes detected. Health trends do not demonstrate significant abnormality. Battery longevity is 8.5 years. RA pacing is 29.0 %, RV pacing is 99.0 %.  LABORATORY DATA: CBC Latest Ref Rng & Units 03/26/2019 11/20/2018 11/09/2017  WBC 3.4 - 10.8 x10E3/uL 6.7 5.8 7.1  Hemoglobin 11.1 - 15.9 g/dL 14.4 12.7 13.5  Hematocrit 34.0 - 46.6 % 44.1 39.7 40.2  Platelets 150 - 450 x10E3/uL 243 201 232    CMP Latest Ref Rng & Units 02/26/2020 08/08/2019 03/26/2019  Glucose 65 - 99 mg/dL 87 80 86  BUN 8 - 27 mg/dL 19 18 19   Creatinine 0.57 - 1.00 mg/dL 0.84 0.85 0.86  Sodium 134 - 144 mmol/L 142 143 143  Potassium 3.5 - 5.2 mmol/L 4.2 4.2 4.0  Chloride 96 - 106 mmol/L 102 103 99   CO2 20 - 29 mmol/L 25 24 27   Calcium 8.7 - 10.3 mg/dL 10.2 10.1 10.6(H)  Total Protein 6.0 - 8.5 g/dL - - 7.5  Total Bilirubin 0.0 - 1.2 mg/dL - - 0.8  Alkaline Phos 39 - 117 IU/L - - 132(H)  AST 0 - 40 IU/L - - 19  ALT 0 - 32 IU/L - - 13    Lipid Panel     Component Value Date/Time   CHOL 152 03/26/2019 1502   TRIG 104 03/26/2019 1502   HDL 52 03/26/2019 1502   CHOLHDL 2.9 11/20/2018 0919   CHOLHDL 2.6 11/08/2016 1035   VLDL 20 09/15/2015 0914   LDLCALC 81 03/26/2019 1502   LDLCALC 75 11/08/2016  1035   LABVLDL 19 03/26/2019 1502    Lab Results  Component Value Date   HGBA1C 5.6 03/26/2019   No components found for: NTPROBNP Lab Results  Component Value Date   TSH 3.360 03/26/2019   TSH 1.360 05/19/2016    Cardiac Panel (last 3 results) No results for input(s): CKTOTAL, CKMB, TROPONINIHS, RELINDX in the last 72 hours.  IMPRESSION:    ICD-10-CM   1. Medication side effects  T88.7XXA   2. Palpitations  R00.2 DISCONTINUED: diltiazem (CARDIZEM CD) 120 MG 24 hr capsule  3. Benign hypertension  I10   4. Mobitz type 2 second degree AV block  I44.1   5. Pacemaker S/P [employer] MRI model [id number]  Z95.0   6. Mixed hyperlipidemia  E78.2   7. Class 1 obesity due to excess calories with serious comorbidity and body mass index (BMI) of 34.0 to 34.9 in adult  E66.09    Z68.34      RECOMMENDATIONS: Margaret Reese is a 75 y.o. female whose past medical history and cardiovascular risk factors include: Hypertension, secondary AV block status post pacemaker, hyperlipidemia, postmenopausal female, advanced age, obesity.  Medication side effects: At last office visit patient was concerned with regards to having decreased energy, fatigue, and depressed moods.  Shared decision was to wean off Toprol-XL and she has successfully done so since last visit. Since discontinuation of Toprol-XL her mood has improved significantly.  She is noted a day and night  difference. Toprol-XL added to her allergies and intolerance during today's visit.  Palpitations: Since discontinuation of Toprol-XL she has experienced palpitations intermittently. She is concerned that she may have underlying atrial fibrillation. Based on the most recent pacemaker interrogation report she has paroxysmal atrial tachycardia but no evidence of mode switches to suggest A. fib. Shared decision was to start Cardizem 120 mg p.o. daily and monitor.  Benign essential hypertension:  Office blood pressure not well controlled.  Home blood pressures are very well controlled.    Continue current medical therapy.  Low-salt diet recommended.  History of Mobitz type II AV block status post pacemaker implantation:  Most recent pacemaker report reviewed with the patient.  Findings noted above for further reference.  Mixed hyperlipidemia: Continue statin therapy.  Patient does not endorse any myalgias.  FINAL MEDICATION LIST END OF ENCOUNTER: Meds ordered this encounter  Medications  . DISCONTD: diltiazem (CARDIZEM CD) 120 MG 24 hr capsule    Sig: Take 1 capsule (120 mg total) by mouth daily. Hold if top blood pressure number less than 100 mmHg or heart rate less than 60 pulse.    Dispense:  30 capsule    Refill:  0    Medications Discontinued During This Encounter  Medication Reason  . Albuterol Sulfate (PROAIR RESPICLICK IN) Error  . metoprolol tartrate (LOPRESSOR) 25 MG tablet Error     Current Outpatient Medications:  .  acetaminophen (TYLENOL) 325 MG tablet, Take 1-2 tablets (325-650 mg total) by mouth every 4 (four) hours as needed for mild pain., Disp: , Rfl:  .  aspirin 81 MG tablet, Take 81 mg by mouth daily., Disp: , Rfl:  .  Cholecalciferol (VITAMIN D3) 1000 UNITS CAPS, Take 2 capsules by mouth every evening., Disp: , Rfl:  .  fish oil-omega-3 fatty acids 1000 MG capsule, Take 1 g by mouth daily. Taking 1000mg , Disp: , Rfl:  .  magnesium oxide (MAG-OX) 400 MG  tablet, Take 400 mg by mouth daily., Disp: , Rfl:  .  Multiple Vitamins-Minerals (PRESERVISION AREDS 2) CAPS, Take 1 capsule by mouth 2 (two) times daily. , Disp: , Rfl:  .  olmesartan (BENICAR) 20 MG tablet, Take 1 tablet (20 mg total) by mouth daily., Disp: 90 tablet, Rfl: 0 .  potassium citrate (UROCIT-K) 10 MEQ (1080 MG) SR tablet, Take 10 mEq by mouth 2 (two) times daily., Disp: , Rfl:  .  simvastatin (ZOCOR) 20 MG tablet, TAKE 1 (ONE)TABLET DAILY IN THE EVENING, Disp: 90 tablet, Rfl: 0 .  diltiazem (CARDIZEM CD) 120 MG 24 hr capsule, TAKE 1 CAPSULE( 120 MG TOTAL) BY MOUTH DAILY. HOLD IF TOP BLOOD PRESSURE NUMBER LESS THAN 100 MMHG OR HEART RATE LESS THAN 60 PULSE, Disp: 90 capsule, Rfl: 0  No orders of the defined types were placed in this encounter.  --Continue cardiac medications as reconciled in final medication list. --Return in about 3 months (around 08/01/2020) for Follow up, Palpitations, medication changes. . Or sooner if needed. --Continue follow-up with your primary care physician regarding the management of your other chronic comorbid conditions.  Patient's questions and concerns were addressed to her satisfaction. She voices understanding of the instructions provided during this encounter.   This note was created using a voice recognition software as a result there may be grammatical errors inadvertently enclosed that do not reflect the nature of this encounter. Every attempt is made to correct such errors.  Rex Kras, [place], [facility]  Pager: [phone] Office: [phone]

## 2020-05-06 ENCOUNTER — Other Ambulatory Visit: Payer: Self-pay

## 2020-05-06 ENCOUNTER — Ambulatory Visit (INDEPENDENT_AMBULATORY_CARE_PROVIDER_SITE_OTHER): Payer: Medicare Other | Admitting: Family Medicine

## 2020-05-06 ENCOUNTER — Encounter (INDEPENDENT_AMBULATORY_CARE_PROVIDER_SITE_OTHER): Payer: Self-pay | Admitting: Family Medicine

## 2020-05-06 VITALS — BP 145/75 | HR 88 | Temp 97.6°F | Ht 61.0 in | Wt 181.0 lb

## 2020-05-06 DIAGNOSIS — I10 Essential (primary) hypertension: Secondary | ICD-10-CM

## 2020-05-06 DIAGNOSIS — Z6834 Body mass index (BMI) 34.0-34.9, adult: Secondary | ICD-10-CM | POA: Diagnosis not present

## 2020-05-06 DIAGNOSIS — R002 Palpitations: Secondary | ICD-10-CM

## 2020-05-06 DIAGNOSIS — E669 Obesity, unspecified: Secondary | ICD-10-CM | POA: Diagnosis not present

## 2020-05-06 DIAGNOSIS — F3289 Other specified depressive episodes: Secondary | ICD-10-CM

## 2020-05-11 ENCOUNTER — Other Ambulatory Visit: Payer: Self-pay

## 2020-05-11 ENCOUNTER — Other Ambulatory Visit: Payer: Self-pay | Admitting: Family Medicine

## 2020-05-11 ENCOUNTER — Telehealth: Payer: Self-pay | Admitting: Family Medicine

## 2020-05-11 DIAGNOSIS — E785 Hyperlipidemia, unspecified: Secondary | ICD-10-CM

## 2020-05-11 MED ORDER — SIMVASTATIN 20 MG PO TABS: ORAL_TABLET | ORAL | 0 refills | Status: DC

## 2020-05-11 NOTE — Telephone Encounter (Signed)
Done KH 

## 2020-05-11 NOTE — Telephone Encounter (Signed)
Pt Simvastatin was sent to the wrong pharmacy. She needs it sent to the [facility] on [address] and [address].

## 2020-05-12 NOTE — Progress Notes (Signed)
Chief Complaint:   OBESITY Margaret Reese is here to discuss her progress with her obesity treatment plan along with follow-up of her obesity related diagnoses.   Today's visit was #: 18 Starting weight: 212 lbs Starting date: 03/26/2019 Today's weight: 181 lbs Today's date: 05/06/2020 Total lbs lost to date: 31 lbs Body mass index is 34.2 kg/m.  Total weight loss percentage to date: -14.62%  Interim History:  Margaret Reese says she has felt "shaky" feeling after stopping her beta blocker - trial of diltiazem.  Worse in the morning.  She is on fluid restriction of 3 glasses per day.  She endorses cravings.  We discussed metformin.  Current Meal Plan: the Category 1 Plan for 40% of the time.  Current Exercise Plan: Stretching/aerobics for 30-40 minutes 5-6 times per week.  Assessment/Plan:   1. Essential hypertension Not at goal. Medications: Benicar 20 mg daily, diltiazem 120 mg daily.  Plan: Avoid buying foods that are: processed, frozen, or prepackaged to avoid excess salt. We will continue to monitor closely alongside her PCP and/or Specialist.  Regular follow up with PCP and specialists was also encouraged.   BP Readings from Last 3 Encounters:  05/06/20 (!) 145/75  05/01/20 (!) 151/72  04/08/20 139/85   Lab Results  Component Value Date   CREATININE 0.84 02/26/2020   2. Palpitations Cardiology note reviewed from 05/01/2020.  Diltiazem initiated. We will continue to monitor symptoms as they relate to her weight loss journey.  3. Other depression, with emotional eating Mood greatly improved since stoping metoprolol.  Still craving sweets.  Medication: None.  Plan:  Behavior modification techniques were discussed today to help deal with emotional/non-hunger eating behaviors.  4. Class 1 obesity with serious comorbidity and body mass index (BMI) of 34.0 to 34.9 in adult, unspecified obesity type  Course: Margaret Reese is currently in the action stage of change. As such, her goal is to  continue with weight loss efforts.   Nutrition goals: She has agreed to the Category 1 Plan.   Exercise goals: For substantial health benefits, adults should do at least 150 minutes (2 hours and 30 minutes) a week of moderate-intensity, or 75 minutes (1 hour and 15 minutes) a week of vigorous-intensity aerobic physical activity, or an equivalent combination of moderate- and vigorous-intensity aerobic activity. Aerobic activity should be performed in episodes of at least 10 minutes, and preferably, it should be spread throughout the week.  Behavioral modification strategies: decreasing simple carbohydrates, keeping healthy foods in the home and emotional eating strategies.  Margaret Reese has agreed to follow-up with our clinic in 4-6 weeks. She was informed of the importance of frequent follow-up visits to maximize her success with intensive lifestyle modifications for her multiple health conditions.   Objective:   Blood pressure (!) 145/75, pulse 88, temperature 97.6 F (36.4 C), temperature source Oral, height 5\' 1"  (1.549 m), weight 181 lb (82.1 kg), SpO2 97 %. Body mass index is 34.2 kg/m.  General: Cooperative, alert, well developed, in no acute distress. HEENT: Conjunctivae and lids unremarkable. Cardiovascular: Regular rhythm.  Lungs: Normal work of breathing. Neurologic: No focal deficits.   Lab Results  Component Value Date   CREATININE 0.84 02/26/2020   BUN 19 02/26/2020   NA 142 02/26/2020   K 4.2 02/26/2020   CL 102 02/26/2020   CO2 25 02/26/2020   Lab Results  Component Value Date   ALT 13 03/26/2019   AST 19 03/26/2019   ALKPHOS 132 (H) 03/26/2019   BILITOT 0.8  03/26/2019   Lab Results  Component Value Date   HGBA1C 5.6 03/26/2019   Lab Results  Component Value Date   INSULIN 8.1 03/26/2019   Lab Results  Component Value Date   TSH 3.360 03/26/2019   Lab Results  Component Value Date   CHOL 152 03/26/2019   HDL 52 03/26/2019   LDLCALC 81 03/26/2019   TRIG  104 03/26/2019   CHOLHDL 2.9 11/20/2018   Lab Results  Component Value Date   WBC 6.7 03/26/2019   HGB 14.4 03/26/2019   HCT 44.1 03/26/2019   MCV 89 03/26/2019   PLT 243 03/26/2019   Lab Results  Component Value Date   IRON 64 03/26/2019   TIBC 301 03/26/2019   FERRITIN 260 (H) 03/26/2019   Obesity Behavioral Intervention:   Approximately 15 minutes were spent on the discussion below.  ASK: We discussed the diagnosis of obesity with Margaret Reese today and Margaret Reese agreed to give [name] permission to discuss obesity behavioral modification therapy today.  ASSESS: Margaret Reese has the diagnosis of obesity and her BMI today is 34.3. Margaret Reese is in the action stage of change.   ADVISE: Margaret Reese was educated on the multiple health risks of obesity as well as the benefit of weight loss to improve her health. She was advised of the need for long term treatment and the importance of lifestyle modifications to improve her current health and to decrease her risk of future health problems.  AGREE: Multiple dietary modification options and treatment options were discussed and Margaret Reese agreed to follow the recommendations documented in the above note.  ARRANGE: Margaret Reese was educated on the importance of frequent visits to treat obesity as outlined per CMS and USPSTF guidelines and agreed to schedule her next follow up appointment today.  Attestation Statements:   Reviewed by clinician on day of visit: allergies, medications, problem list, medical history, surgical history, family history, social history, and previous encounter notes.  I, Water quality scientist, CMA, am acting as transcriptionist for [name], DO  I have reviewed the above documentation for accuracy and completeness, and I agree with the above. [name], DO

## 2020-05-19 ENCOUNTER — Telehealth: Payer: Self-pay | Admitting: Pharmacist

## 2020-05-19 NOTE — Telephone Encounter (Signed)
CARE PLAN ENTRY  05/19/2020 Name: Margaret Reese MRN: [id number] DOB: [birth year]  Margaret Reese is enrolled in Remote Patient Monitoring/Principle Care Monitoring.  Date of Enrollment: 08/28/19 Supervising physician: Rex Kras Indication: HTN  Remote Readings: Compliant and Avg BP: 112/57, HR:79  Next scheduled OV: 07/31/20  Pharmacist Clinical Goal(s):  Marland Kitchen Over the next 90 days, patient will demonstrate Improved medication adherence as evidenced by medication fill history . Over the next 90 days, patient will demonstrate improved understanding of prescribed medications and rationale for usage as evidenced by patient teach back . Over the next 90 days, patient will experience decrease in ED visits. ED visits in last 6 months = 0 . Over the next 90 days, patient will not experience hospital admission. Hospital Admissions in last 6 months = 0  Interventions: . Provider and Inter-disciplinary care team collaboration (see longitudinal plan of care) . Comprehensive medication review performed. . Discussed plans with patient for ongoing care management follow up and provided patient with direct contact information for care management team . Collaboration with provider re: medication management  Patient Self Care Activities:  . Self administers medications as prescribed . Attends all scheduled provider appointments . Performs ADL's independently . Performs IADL's independently  Allergies  Allergen Reactions  . Toprol Xl [Metoprolol Tartrate]     Depressive mood.    Outpatient Encounter Medications as of 05/19/2020  Medication Sig  . acetaminophen (TYLENOL) 325 MG tablet Take 1-2 tablets (325-650 mg total) by mouth every 4 (four) hours as needed for mild pain.  Marland Kitchen aspirin 81 MG tablet Take 81 mg by mouth daily.  . Cholecalciferol (VITAMIN D3) 1000 UNITS CAPS Take 2 capsules by mouth every evening.  . diltiazem (CARDIZEM CD) 120 MG 24 hr capsule TAKE 1 CAPSULE( 120 MG TOTAL) BY  MOUTH DAILY. HOLD IF TOP BLOOD PRESSURE NUMBER LESS THAN 100 MMHG OR HEART RATE LESS THAN 60 PULSE  . fish oil-omega-3 fatty acids 1000 MG capsule Take 1 g by mouth daily. Taking 1000mg   . magnesium oxide (MAG-OX) 400 MG tablet Take 400 mg by mouth daily.  . Multiple Vitamins-Minerals (PRESERVISION AREDS 2) CAPS Take 1 capsule by mouth 2 (two) times daily.   Marland Kitchen olmesartan (BENICAR) 20 MG tablet Take 1 tablet (20 mg total) by mouth daily.  . potassium citrate (UROCIT-K) 10 MEQ (1080 MG) SR tablet Take 10 mEq by mouth 2 (two) times daily.  . simvastatin (ZOCOR) 20 MG tablet Take one tablet int he evening   No facility-administered encounter medications on file as of 05/19/2020.    Hypertension   BP goal is:  <130/80  Office blood pressures are  BP Readings from Last 3 Encounters:  05/06/20 (!) 145/75  05/01/20 (!) 151/72  04/08/20 139/85    Patient is currently controlled on the following medications: Diltiazem 120 mg, Olmesartan 20 mg  Patient checks BP at home daily  Patient home BP readings are ranging: 100-125/41-70  Patient has tried  these meds in the past: metoprolol (mood side effects), lisinopril-HCTZ,   We discussed diet and exercise extensively  Plan  Continue current medications and control with diet and exercise  ______________ Visit Information SDOH (Social Determinants of Health) assessments performed: Yes.  Margaret Reese was given information about Principle Care Management/Remote Patient Monitoring services today including:  1. RPM/PCM service includes personalized support from designated clinical staff supervised by her physician, including individualized plan of care and coordination with other care providers 2. 24/7 contact phone numbers for assistance  for urgent and routine care needs. 3. Standard insurance, coinsurance, copays and deductibles apply for principle care management only during months in which we provide at least 30 minutes of these services.  Most insurances cover these services at 100%, however patients may be responsible for any copay, coinsurance and/or deductible if applicable. This service may help you avoid the need for more expensive face-to-face services. 4. Only one practitioner may furnish and bill the service in a calendar month. 5. The patient may stop PCM/RPM services at any time (effective at the end of the month) by phone call to the office staff.  Patient agreed to services and verbal consent obtained.   Manuela Schwartz, Pharm.D. [facility] [phone] Ext: [phone]

## 2020-05-21 ENCOUNTER — Telehealth: Payer: Self-pay

## 2020-05-21 NOTE — Telephone Encounter (Signed)
Patient called and stated that her BP diastolic has been running under 60.   This morning 106/58 HR89  She was told not to take it if that number goes below 60, and it has been 4 days now, that it has been under 60. Per Dr. Terri Skains, she is correct, not to take it when diatolic is under 60, but if she goes another week without having to take it, she is to call back to let us know, and he will then reduce the dosage. Patient aware.

## 2020-05-23 DIAGNOSIS — I10 Essential (primary) hypertension: Secondary | ICD-10-CM | POA: Diagnosis not present

## 2020-05-25 NOTE — Telephone Encounter (Signed)
Please follow-up with the patient to see if she is currently on diltiazem.  The instructions I provided her at the last office visit was to hold if her systolic blood pressure is less than 100 mmHg or HR is less than 60 bpm, not the diastolic blood pressure.

## 2020-05-25 NOTE — Telephone Encounter (Signed)
Patient aware of correct instructions.

## 2020-06-01 ENCOUNTER — Ambulatory Visit (INDEPENDENT_AMBULATORY_CARE_PROVIDER_SITE_OTHER): Payer: Medicare Other | Admitting: Family Medicine

## 2020-06-01 ENCOUNTER — Other Ambulatory Visit: Payer: Self-pay

## 2020-06-01 ENCOUNTER — Encounter: Payer: Self-pay | Admitting: Family Medicine

## 2020-06-01 VITALS — BP 148/82 | HR 89 | Temp 98.6°F | Ht 60.25 in | Wt 185.6 lb

## 2020-06-01 DIAGNOSIS — M1990 Unspecified osteoarthritis, unspecified site: Secondary | ICD-10-CM | POA: Diagnosis not present

## 2020-06-01 DIAGNOSIS — I10 Essential (primary) hypertension: Secondary | ICD-10-CM | POA: Diagnosis not present

## 2020-06-01 DIAGNOSIS — I5032 Chronic diastolic (congestive) heart failure: Secondary | ICD-10-CM | POA: Diagnosis not present

## 2020-06-01 DIAGNOSIS — Z96641 Presence of right artificial hip joint: Secondary | ICD-10-CM

## 2020-06-01 DIAGNOSIS — I495 Sick sinus syndrome: Secondary | ICD-10-CM | POA: Diagnosis not present

## 2020-06-01 DIAGNOSIS — J301 Allergic rhinitis due to pollen: Secondary | ICD-10-CM

## 2020-06-01 DIAGNOSIS — J4520 Mild intermittent asthma, uncomplicated: Secondary | ICD-10-CM | POA: Diagnosis not present

## 2020-06-01 DIAGNOSIS — K219 Gastro-esophageal reflux disease without esophagitis: Secondary | ICD-10-CM

## 2020-06-01 DIAGNOSIS — E7849 Other hyperlipidemia: Secondary | ICD-10-CM

## 2020-06-01 DIAGNOSIS — Z950 Presence of cardiac pacemaker: Secondary | ICD-10-CM

## 2020-06-01 NOTE — Progress Notes (Signed)
Margaret Reese is a 75 y.o. female who presents for annual wellness visit and follow-up on chronic medical conditions.  She has a significant cardiac history with sick sinus syndrome as well as cardiac pacer with AV block and a previous history of chronic diastolic CHF.  She sees cardiology regularly for this.  She continues on diltiazem which she was recently switched to and also continues on simvastatin for her lipids.  Her reflux seems to be under good control.  She has had no difficulty from her asthma or her allergies.  Arthritis seems to be less of an issue.  She has had a right hip replacement and is doing well with this.  She does plan on going on a trip to the beach this weekend.  Immunizations and Health Maintenance Immunization History  Administered Date(s) Administered  . Influenza Split 11/07/2011, 12/01/2012  . Influenza Whole 01/11/2006, 11/28/2008, 11/30/2010  . Influenza-Unspecified 12/08/2014, 12/07/2015, 11/21/2016, 11/21/2017, 11/16/2018  . PFIZER(Purple Top)SARS-COV-2 Vaccination 04/07/2019, 04/30/2019  . Pneumococcal Conjugate-13 09/15/2015  . Pneumococcal Polysaccharide-23 09/07/2004, 09/11/2012  . Td 09/17/2004  . Tdap 06/01/2015  . Zoster 12/16/2009   Health Maintenance Due  Topic Date Due  . COVID-19 Vaccine (3 - Booster for Pfizer series) 10/31/2019    Last Pap smear: aged out  Last mammogram: 11/12/19 Last colonoscopy: 10/14/14 Last DEXA: 03/18/14 Dentist: Q six moonths Ophtho: Q year Exercise: walking and work out at home 40 min a day   Other doctors caring for patient include: Dr. Rosana Hoes urology, Dr. Terri Skains cardiology, Dr Juleen China healthy weight  Advanced directives: Does Patient Have a Medical Advance Directive?: Yes Type of Advance Directive: Living will Does patient want to make changes to medical advance directive?: No - Patient declined  Depression screen:  See questionnaire below.  Depression screen [facility] 2/9 06/01/2020 08/21/2019 03/26/2019 11/20/2018  11/09/2017  Decreased Interest 0 0 1 0 0  Down, Depressed, Hopeless 0 1 1 1  0  PHQ - 2 Score 0 1 2 1  0  Altered sleeping - - 3 - -  Tired, decreased energy - - 3 - -  Change in appetite - - 3 - -  Feeling bad or failure about yourself  - - 1 - -  Trouble concentrating - - 0 - -  Moving slowly or fidgety/restless - - 1 - -  Suicidal thoughts - - 0 - -  PHQ-9 Score - - 13 - -  Difficult doing work/chores - - Not difficult at all - -    Fall Risk Screen: see questionnaire below. Fall Risk  06/01/2020 08/21/2019 11/20/2018 11/09/2017 11/08/2016  Falls in the past year? 0 0 0 No Yes  Comment - - - - passed out- had pacemaker implanted  Number falls in past yr: 0 - - - 1  Injury with Fall? 0 - - - Yes  Risk for fall due to : No Fall Risks - - - -  Risk for fall due to: Comment - - - - -  Follow up Falls evaluation completed - - - -    ADL screen:  See questionnaire below Functional Status Survey: Is the patient deaf or have difficulty hearing?: No Does the patient have difficulty seeing, even when wearing glasses/contacts?: Yes (right eye) Does the patient have difficulty concentrating, remembering, or making decisions?: No Does the patient have difficulty walking or climbing stairs?: No Does the patient have difficulty dressing or bathing?: No Does the patient have difficulty doing errands alone such as visiting a doctor's  office or shopping?: No   Review of Systems Constitutional: -, -unexpected weight change, -anorexia, -fatigue Allergy: -sneezing, -itching, -congestion Dermatology: denies changing moles, rash, lumps ENT: -runny nose, -ear pain, -sore throat,  Cardiology:  -chest pain, -palpitations, -orthopnea, Respiratory: -cough, -shortness of breath, -dyspnea on exertion, -wheezing,  Gastroenterology: -abdominal pain, -nausea, -vomiting, -diarrhea, -constipation, -dysphagia Hematology: -bleeding or bruising problems Musculoskeletal: -arthralgias, -myalgias, -joint swelling,  -back pain, - Ophthalmology: -vision changes,  Urology: -dysuria, -difficulty urinating,  -urinary frequency, -urgency, incontinence Neurology: -, -numbness, , -memory loss, -falls, -dizziness    PHYSICAL EXAM:   General Appearance: Alert, cooperative, no distress, appears stated age Head: Normocephalic, without obvious abnormality, atraumatic Eyes: PERRL, conjunctiva/corneas clear, EOM's intact,  Ears: Normal TM's and external ear canals Nose: Nares normal, mucosa normal, no drainage or sinus tenderness Throat: Lips, mucosa, and tongue normal; teeth and gums normal Neck: Supple, no lymphadenopathy;  thyroid:  no enlargement/tenderness/nodules; no carotid bruit or JVD Lungs: Clear to auscultation bilaterally without wheezes, rales or ronchi; respirations unlabored Heart: Regular rate and rhythm, S1 and S2 normal, no murmur, rubor gallop Abdomen: Soft, non-tender, nondistended, normoactive bowel sounds,  no masses, no hepatosplenomegaly Skin:  Skin color, texture, turgor normal, no rashes or lesions Lymph nodes: Cervical, supraclavicular, and axillary nodes normal Neurologic:  CNII-XII intact, normal strength, sensation and gait; reflexes 2+ and symmetric throughout Psych: Normal mood, affect, hygiene and grooming.  ASSESSMENT/PLAN: Hypertension, unspecified type - Plan: CBC with Differential/Platelet, Comprehensive metabolic panel  Gastroesophageal reflux disease without esophagitis  Chronic diastolic (congestive) heart failure (HCC)  Sinus node dysfunction (HCC), Chronic  Other hyperlipidemia - Plan: Lipid panel  Seasonal allergic rhinitis due to pollen  Mild intermittent asthma without complication  Status post total replacement of right hip  Pacemaker S/P [employer] MRI model [id number]  No intervention needed for her allergies or asthma.  Continue to be followed by cardiology.  Continue with the weight loss management as it has helped her lose another 30  pounds.  She was well over 300 pounds at one time. I congratulated her on her weight loss and recommend she continue on her present medication regimen.  Discussed ; at least 30 minutes of aerobic activity at least 5 days/week and weight-bearing exercise 2x/week;  healthy diet, including goals of calcium and vitamin D intake and alcohol recommendations (less than or equal to 1 drink/day)  Immunization recommendations discussed.  Colonoscopy recommendations reviewed.  Recommend she get Shingrix and Covid booster in the near future.   Medicare Attestation I have personally reviewed: The patient's medical and social history Their use of alcohol, tobacco or illicit drugs Their current medications and supplements The patient's functional ability including ADLs,fall risks, home safety risks, cognitive, and hearing and visual impairment Diet and physical activities Evidence for depression or mood disorders  The patient's weight, height, and BMI have been recorded in the chart.  I have made referrals, counseling, and provided education to the patient based on review of the above and I have provided the patient with a written personalized care plan for preventive services.     Jill Alexanders, MD   06/01/2020

## 2020-06-02 LAB — COMPREHENSIVE METABOLIC PANEL
ALT: 15 IU/L (ref 0–32)
AST: 18 IU/L (ref 0–40)
Albumin/Globulin Ratio: 2.2 (ref 1.2–2.2)
Albumin: 4.8 g/dL — ABNORMAL HIGH (ref 3.7–4.7)
Alkaline Phosphatase: 125 IU/L — ABNORMAL HIGH (ref 44–121)
BUN/Creatinine Ratio: 23 (ref 12–28)
BUN: 18 mg/dL (ref 8–27)
Bilirubin Total: 0.8 mg/dL (ref 0.0–1.2)
CO2: 24 mmol/L (ref 20–29)
Calcium: 10.3 mg/dL (ref 8.7–10.3)
Chloride: 102 mmol/L (ref 96–106)
Creatinine, Ser: 0.77 mg/dL (ref 0.57–1.00)
Globulin, Total: 2.2 g/dL (ref 1.5–4.5)
Glucose: 84 mg/dL (ref 65–99)
Potassium: 4 mmol/L (ref 3.5–5.2)
Sodium: 142 mmol/L (ref 134–144)
Total Protein: 7 g/dL (ref 6.0–8.5)
eGFR: 80 mL/min/{1.73_m2} (ref >59)

## 2020-06-02 LAB — CBC WITH DIFFERENTIAL/PLATELET
Basophils Absolute: 0.1 10*3/uL (ref 0.0–0.2)
Basos: 1 %
EOS (ABSOLUTE): 0.2 10*3/uL (ref 0.0–0.4)
Eos: 4 %
Hematocrit: 40.6 % (ref 34.0–46.6)
Hemoglobin: 13.4 g/dL (ref 11.1–15.9)
Immature Grans (Abs): 0 10*3/uL (ref 0.0–0.1)
Immature Granulocytes: 0 %
Lymphocytes Absolute: 2 10*3/uL (ref 0.7–3.1)
Lymphs: 34 %
MCH: 28.7 pg (ref 26.6–33.0)
MCHC: 33 g/dL (ref 31.5–35.7)
MCV: 87 fL (ref 79–97)
Monocytes Absolute: 0.4 10*3/uL (ref 0.1–0.9)
Monocytes: 7 %
Neutrophils Absolute: 3.2 10*3/uL (ref 1.4–7.0)
Neutrophils: 54 %
Platelets: 221 10*3/uL (ref 150–450)
RBC: 4.67 x10E6/uL (ref 3.77–5.28)
RDW: 13.2 % (ref 11.7–15.4)
WBC: 5.9 10*3/uL (ref 3.4–10.8)

## 2020-06-02 LAB — LIPID PANEL
Chol/HDL Ratio: 2.6 ratio (ref 0.0–4.4)
Cholesterol, Total: 163 mg/dL (ref 100–199)
HDL: 63 mg/dL (ref >39)
LDL Chol Calc (NIH): 84 mg/dL (ref 0–99)
Triglycerides: 85 mg/dL (ref 0–149)
VLDL Cholesterol Cal: 16 mg/dL (ref 5–40)

## 2020-06-09 DIAGNOSIS — Z45010 Encounter for checking and testing of cardiac pacemaker pulse generator [battery]: Secondary | ICD-10-CM | POA: Diagnosis not present

## 2020-06-09 DIAGNOSIS — I495 Sick sinus syndrome: Secondary | ICD-10-CM | POA: Diagnosis not present

## 2020-06-09 DIAGNOSIS — Z950 Presence of cardiac pacemaker: Secondary | ICD-10-CM | POA: Diagnosis not present

## 2020-06-09 DIAGNOSIS — Z45018 Encounter for adjustment and management of other part of cardiac pacemaker: Secondary | ICD-10-CM | POA: Diagnosis not present

## 2020-06-17 ENCOUNTER — Ambulatory Visit (INDEPENDENT_AMBULATORY_CARE_PROVIDER_SITE_OTHER): Payer: Medicare Other | Admitting: Family Medicine

## 2020-06-17 ENCOUNTER — Other Ambulatory Visit: Payer: Self-pay | Admitting: Family Medicine

## 2020-06-17 DIAGNOSIS — E785 Hyperlipidemia, unspecified: Secondary | ICD-10-CM

## 2020-06-22 DIAGNOSIS — I10 Essential (primary) hypertension: Secondary | ICD-10-CM | POA: Diagnosis not present

## 2020-06-26 DIAGNOSIS — Z23 Encounter for immunization: Secondary | ICD-10-CM | POA: Diagnosis not present

## 2020-07-07 ENCOUNTER — Other Ambulatory Visit: Payer: Self-pay

## 2020-07-07 ENCOUNTER — Ambulatory Visit (INDEPENDENT_AMBULATORY_CARE_PROVIDER_SITE_OTHER): Payer: Medicare Other | Admitting: Family Medicine

## 2020-07-07 ENCOUNTER — Encounter (INDEPENDENT_AMBULATORY_CARE_PROVIDER_SITE_OTHER): Payer: Self-pay | Admitting: Family Medicine

## 2020-07-07 VITALS — BP 135/64 | HR 82 | Temp 98.0°F | Ht 61.0 in | Wt 183.0 lb

## 2020-07-07 DIAGNOSIS — F418 Other specified anxiety disorders: Secondary | ICD-10-CM

## 2020-07-07 DIAGNOSIS — I10 Essential (primary) hypertension: Secondary | ICD-10-CM | POA: Diagnosis not present

## 2020-07-07 DIAGNOSIS — E66812 Obesity, class 2: Secondary | ICD-10-CM

## 2020-07-07 DIAGNOSIS — R002 Palpitations: Secondary | ICD-10-CM | POA: Diagnosis not present

## 2020-07-07 DIAGNOSIS — Z6838 Body mass index (BMI) 38.0-38.9, adult: Secondary | ICD-10-CM

## 2020-07-07 DIAGNOSIS — G478 Other sleep disorders: Secondary | ICD-10-CM | POA: Diagnosis not present

## 2020-07-07 DIAGNOSIS — Z950 Presence of cardiac pacemaker: Secondary | ICD-10-CM

## 2020-07-07 NOTE — Progress Notes (Signed)
Chief Complaint:   OBESITY Margaret Reese is here to discuss her progress with her obesity treatment plan along with follow-up of her obesity related diagnoses.   Today's visit was #: 12 Starting weight: 212 lbs Starting date: 03/26/2019 Total lbs lost to date: 29 lbs Total weight loss percentage to date: -13.68%  Interim History: Margaret Reese says she is "losing control", especially at night/on weekends.  She is having increased stress at home (granddaughter with autism).  She says that diltiazem is "not controlling tremors".  This is worse in the morning.  She rests and does her devotional prior to taking her diltiazem.  Plan:  Get pulse ox to monitor pulse during this time.  Nutrition Plan: Category 1 Plan in 30% of the time. Activity: Aerobics for 30 minutes 5 times per week.  Assessment/Plan:   1. Primary hypertension At goal. Medications: diltiazem 120 mg daily, Benicar 20 mg daily.   Plan: Avoid buying foods that are: processed, frozen, or prepackaged to avoid excess salt. We will watch for signs of hypotension as she continues lifestyle modifications.  BP Readings from Last 3 Encounters:  07/07/20 135/64  06/01/20 (!) 148/82  05/06/20 (!) 145/75   Lab Results  Component Value Date   CREATININE 0.77 06/01/2020   2. Pacemaker S/P [place] MRI model [id number] Margaret Reese is currently being followed by Cardiology. Notes reviewed.  3. Palpitations She is taking diltiazem 120 mg daily.  Plan:  We will continue to monitor symptoms as they relate to her weight loss journey.  4. Sleep related eating disorder Night Eating Syndrome (NES) is currently included in the "Other Specified Feeding or Eating Disorder" category of the DSM-5.  Counseling . Night Eating Syndrome is characterized by eating after awakening from sleep or by excessive food consumption (usually > 25% of daily calories) after the evening meal. It is "relapsing and remitting," meaning that it comes and goes. It  usually worsens during stressful times in your life. The person is aware and recalls the eating. Night Eating Syndrome usually starts in early adulthood. Men and women are both affected, but women tend to have more severe symptoms.  . Interesting statistics: o The prevalence of NES is 1.5% of the general population, but found in 8.9% of patients that enroll in an obesity medicine program. Approximately 15-20% ALSO have Binge Eating Disorder.  . Treatment for Night Eating Syndrome include: o CBT, SSRIs, Topiramate, Progressive Muscle Relaxation, Phototherapy, and Normalized Eating.  5. Situational anxiety Behavior modification techniques were discussed today to help Margaret Reese deal with her anxiety.    6. Obesity, current BMI 34.6  Course: Margaret Reese is currently in the action stage of change. As such, her goal is to continue with weight loss efforts.   Nutrition goals: She has agreed to the Category 1 Plan.   Exercise goals: As is.  Behavioral modification strategies: increasing lean protein intake, decreasing simple carbohydrates, increasing vegetables and increasing water intake.  Margaret Reese has agreed to follow-up with our clinic in 2 weeks. She was informed of the importance of frequent follow-up visits to maximize her success with intensive lifestyle modifications for her multiple health conditions.   Objective:   There were no vitals taken for this visit. There is no height or weight on file to calculate BMI.  General: Cooperative, alert, well developed, in no acute distress. HEENT: Conjunctivae and lids unremarkable. Cardiovascular: Regular rhythm.  Lungs: Normal work of breathing. Neurologic: No focal deficits.   Lab Results  Component Value  Date   CREATININE 0.77 06/01/2020   BUN 18 06/01/2020   NA 142 06/01/2020   K 4.0 06/01/2020   CL 102 06/01/2020   CO2 24 06/01/2020   Lab Results  Component Value Date   ALT 15 06/01/2020   AST 18 06/01/2020   ALKPHOS 125 (H) 06/01/2020    BILITOT 0.8 06/01/2020   Lab Results  Component Value Date   HGBA1C 5.6 03/26/2019   Lab Results  Component Value Date   INSULIN 8.1 03/26/2019   Lab Results  Component Value Date   TSH 3.360 03/26/2019   Lab Results  Component Value Date   CHOL 163 06/01/2020   HDL 63 06/01/2020   LDLCALC 84 06/01/2020   TRIG 85 06/01/2020   CHOLHDL 2.6 06/01/2020   Lab Results  Component Value Date   WBC 5.9 06/01/2020   HGB 13.4 06/01/2020   HCT 40.6 06/01/2020   MCV 87 06/01/2020   PLT 221 06/01/2020   Lab Results  Component Value Date   IRON 64 03/26/2019   TIBC 301 03/26/2019   FERRITIN 260 (H) 03/26/2019   Obesity Behavioral Intervention:   Approximately 15 minutes were spent on the discussion below.  ASK: We discussed the diagnosis of obesity with Anjelika today and Fritzie agreed to give [name] permission to discuss obesity behavioral modification therapy today.  ASSESS: Margaret Reese has the diagnosis of obesity and her BMI today is 34.6. Margaret Reese is in the action stage of change.   ADVISE: Margaret Reese was educated on the multiple health risks of obesity as well as the benefit of weight loss to improve her health. She was advised of the need for long term treatment and the importance of lifestyle modifications to improve her current health and to decrease her risk of future health problems.  AGREE: Multiple dietary modification options and treatment options were discussed and Margaret Reese agreed to follow the recommendations documented in the above note.  ARRANGE: Margaret Reese was educated on the importance of frequent visits to treat obesity as outlined per CMS and USPSTF guidelines and agreed to schedule her next follow up appointment today.  Attestation Statements:   Reviewed by clinician on day of visit: allergies, medications, problem list, medical history, surgical history, family history, social history, and previous encounter notes.  I, Water quality scientist, CMA, am acting as transcriptionist for [name], DO  I have reviewed the above documentation for accuracy and completeness, and I agree with the above. [name], DO

## 2020-07-15 ENCOUNTER — Other Ambulatory Visit: Payer: Self-pay | Admitting: Family Medicine

## 2020-07-15 ENCOUNTER — Other Ambulatory Visit: Payer: Self-pay | Admitting: Cardiology

## 2020-07-15 DIAGNOSIS — I10 Essential (primary) hypertension: Secondary | ICD-10-CM

## 2020-07-15 DIAGNOSIS — E785 Hyperlipidemia, unspecified: Secondary | ICD-10-CM

## 2020-07-22 DIAGNOSIS — I10 Essential (primary) hypertension: Secondary | ICD-10-CM | POA: Diagnosis not present

## 2020-07-31 ENCOUNTER — Ambulatory Visit: Payer: Medicare Other | Admitting: Cardiology

## 2020-07-31 ENCOUNTER — Encounter: Payer: Self-pay | Admitting: Cardiology

## 2020-07-31 ENCOUNTER — Other Ambulatory Visit: Payer: Self-pay

## 2020-07-31 VITALS — BP 130/56 | HR 83 | Temp 93.3°F | Resp 17 | Ht 61.0 in | Wt 182.6 lb

## 2020-07-31 DIAGNOSIS — I441 Atrioventricular block, second degree: Secondary | ICD-10-CM | POA: Diagnosis not present

## 2020-07-31 DIAGNOSIS — R002 Palpitations: Secondary | ICD-10-CM | POA: Diagnosis not present

## 2020-07-31 DIAGNOSIS — Z950 Presence of cardiac pacemaker: Secondary | ICD-10-CM | POA: Diagnosis not present

## 2020-07-31 DIAGNOSIS — I10 Essential (primary) hypertension: Secondary | ICD-10-CM | POA: Diagnosis not present

## 2020-07-31 DIAGNOSIS — Z6834 Body mass index (BMI) 34.0-34.9, adult: Secondary | ICD-10-CM | POA: Diagnosis not present

## 2020-07-31 DIAGNOSIS — E6609 Other obesity due to excess calories: Secondary | ICD-10-CM | POA: Diagnosis not present

## 2020-07-31 DIAGNOSIS — E782 Mixed hyperlipidemia: Secondary | ICD-10-CM

## 2020-07-31 MED ORDER — DILTIAZEM HCL ER COATED BEADS 120 MG PO CP24: 120.0000 mg | ORAL_CAPSULE | Freq: Every day | ORAL | 1 refills | Status: DC

## 2020-07-31 NOTE — Progress Notes (Addendum)
Margaret Reese Date of Birth: September 17, 1945 MRN: [id number] Primary Care Provider:Lalonde, Elyse Jarvis, MD Former Cardiology Providers: Dr. Doylene Canard, Dr. Terrence Dupont, Dr. Einar Gip, and Jeri Lager, APRN, FNP-C. Primary Cardiologist: Rex Kras, DO, [facility] (established care 07/25/2019) Electrophysiologist: Dr. Allegra Lai  Date: 07/31/20 Last Office Visit: 05/01/2020  Chief Complaint  Patient presents with   Follow-up    3 MONTHS   Palpitations   MEDICATION CHANGES    HPI  Margaret Reese is a 75 y.o.  female who presents to the office with a chief complaint of " 19-month follow-up for palpitations" Patient's past medical history and cardiovascular risk factors include: Hypertension, secondary AV block status post pacemaker, hyperlipidemia, postmenopausal female, advanced age, obesity.  Patient is followed by our practice given her underlying hypertension and status post pacemaker implantation due to second-degree AV block.  In the past her Toprol-XL was weaned off due to her feeling tired, fatigued, and not feeling like she was at baseline.  Given her continued symptoms of palpitations she was started on diltiazem at the last office visit.  Since last office visit she is doing well from a cardiovascular standpoint.  She was transitioned from Toprol-XL to Cardizem and is tolerating the medication well.  At times she still has palpitations/jitteriness and is concerned that she may have Parkinson's.  Patient states that her father had Parkinson's disease as well.  Reviewed the most recent pacemaker report with the patient.  Per report no evidence of atrial fibrillation.  Few episodes of paroxysmal atrial tach.    Patient denies any chest pain or shortness of breath at rest or with effort related activities. Patient's weight has been relatively stable since her last encounter. No hospitalizations or urgent care visits from a cardiovascular standpoint.  FUNCTIONAL STATUS: She does floor exercise for  15minutes a day. She is enrolled in wellness program and has lost 40 pounds since January 2021.    ALLERGIES: Allergies  Allergen Reactions   Toprol Xl [Metoprolol Tartrate]     Depressive mood.      MEDICATION LIST PRIOR TO VISIT: Current Outpatient Medications on File Prior to Visit  Medication Sig Dispense Refill   acetaminophen (TYLENOL) 325 MG tablet Take 1-2 tablets (325-650 mg total) by mouth every 4 (four) hours as needed for mild pain.     aspirin 81 MG tablet Take 81 mg by mouth daily.     Cholecalciferol (VITAMIN D3) 1000 UNITS CAPS Take 2 capsules by mouth every evening.     magnesium oxide (MAG-OX) 400 MG tablet Take 400 mg by mouth daily.     Multiple Vitamins-Minerals (PRESERVISION AREDS 2) CAPS Take 1 capsule by mouth 2 (two) times daily.      olmesartan (BENICAR) 20 MG tablet TAKE 1 TABLET BY MOUTH EVERY DAY 90 tablet 0   potassium citrate (UROCIT-K) 10 MEQ (1080 MG) SR tablet Take 10 mEq by mouth 2 (two) times daily.     simvastatin (ZOCOR) 20 MG tablet TAKE 1 TABLET BY MOUTH IN THE EVENING 90 tablet 1   No current facility-administered medications on file prior to visit.    PAST MEDICAL HISTORY: Past Medical History:  Diagnosis Date   Allergy    Anxiety    Arthritis    Asthma    Complication of anesthesia    pt states has difficulty awakening; also has increased sinus drainage   Constipation    Edema, lower extremity    Encounter for interrogation of cardiac pacemaker 09/05/2018   Falls  GERD (gastroesophageal reflux disease)    H/O back injury    History of bronchitis    History of colon polyps    Hypertension    Imbalance    Kidney cysts    pt states not sure which kidney does see kidney specialist yearly pt states every thing okay currently    Legally blind in right eye, as defined in [place]    Mobitz type II atrioventricular block 11/08/2016   Multiple gastric ulcers    Obesity    Pacemaker S/P [place] MRI model [id number] 05/20/2016    Renal stone    Retinal vein occlusion    Shortness of breath    Sinus node dysfunction ([place]) 12/16/2018   Tingling    left arm    Trace cataracts    Urinary frequency    Vitamin D deficiency     PAST SURGICAL HISTORY: Past Surgical History:  Procedure Laterality Date   CARDIOVASCULAR STRESS TEST  dec 2016   CHOLECYSTECTOMY     COLONOSCOPY  2011   EYE SURGERY Bilateral    Cataract surgery.    LEFT HEART CATH AND CORONARY ANGIOGRAPHY N/A 05/19/2016   Procedure: Left Heart Cath and Coronary Angiography;  Surgeon: Dixie Dials, MD;  Location: [facility];  Service: Cardiovascular;  Laterality: N/A;   PACEMAKER IMPLANT N/A 05/20/2016   Procedure: Pacemaker Implant;  Surgeon: Will Meredith Leeds, MD;  Location: [facility];  Service: Cardiovascular;  Laterality: N/A;   TONSILLECTOMY     TOTAL HIP ARTHROPLASTY Right 01/30/2015   Procedure: RIGHT TOTAL HIP ARTHROPLASTY ANTERIOR APPROACH AND REMOVAL LIPOMA RIGHT HIP;  Surgeon: Mcarthur Rossetti, MD;  Location: [facility];  Service: Orthopedics;  Laterality: Right;   UPPER GI ENDOSCOPY      FAMILY HISTORY: The patient's family history includes Asthma in her brother; Bipolar disorder in her mother; Depression in her mother; Eating disorder in her mother; Kidney disease in her mother; Liver disease in her mother; Obesity in her mother; Stroke in her mother.   SOCIAL HISTORY:  The patient  reports that she has never smoked. She has never used smokeless tobacco. She reports that she does not drink alcohol and does not use drugs.  Review of Systems  Constitutional: Negative for chills and fever.  HENT:  Negative for hoarse voice and nosebleeds.   Eyes:  Negative for discharge, double vision and pain.  Cardiovascular:  Negative for chest pain, claudication, dyspnea on exertion, leg swelling, near-syncope, orthopnea, palpitations, paroxysmal nocturnal dyspnea and syncope.  Respiratory:  Negative for hemoptysis and shortness of  breath.   Musculoskeletal:  Negative for muscle cramps and myalgias.  Gastrointestinal:  Negative for abdominal pain, constipation, diarrhea, hematemesis, hematochezia, melena, nausea and vomiting.  Neurological:  Negative for dizziness and light-headedness.  Psychiatric/Behavioral:  Negative for altered mental status, depression, memory loss and suicidal ideas.    PHYSICAL EXAM: Vitals with BMI 07/31/2020 07/31/2020 07/07/2020  Height - 5\' 1"  5\' 1"   Weight - 182 lbs 10 oz 183 lbs  BMI - 95.28 41.3  Systolic 244 010 272  Diastolic 56 81 64  Pulse 83 92 82    CONSTITUTIONAL: Well-developed and well-nourished. No acute distress.  SKIN: Skin is warm and dry. No rash noted. No cyanosis. No pallor. No jaundice HEAD: Normocephalic and atraumatic.  EYES: No scleral icterus MOUTH/THROAT: Moist oral membranes.  NECK: No JVD present. No thyromegaly noted. LYMPHATIC: No visible cervical adenopathy.  CHEST Normal respiratory effort. No intercostal  retractions.  Pacemaker site is clean dry and intact (left infraclavicular region). LUNGS: Clear to auscultation bilaterally. No stridor. No wheezes. No rales.  CARDIOVASCULAR: Regular rate and rhythm, positive S1-S2, no murmurs rubs or gallops appreciated. ABDOMINAL: No apparent ascites.  EXTREMITIES: No peripheral edema  HEMATOLOGIC: No significant bruising NEUROLOGIC: Oriented to person, place, and time. Nonfocal. Normal muscle tone.  PSYCHIATRIC: Normal mood and affect. Normal behavior. Cooperative  CARDIAC DATABASE: Pacemaker in situ: [place] MRI  dual-chamber pacemaker for symptomatic bradycardia in March 2018 Scheduled Remote pacemaker check 04/22/2020:  Longevity >8 years.  AP 30%, VP 99%.  There were 4 mode switches, EGM = PAT.  There were no high ventricular rate episodes.  No change from prior check.  EKG: 04/02/2020: Atrial sensed and ventricular paced rhythm,  83bpm.   Echocardiogram: 08/22/2019: LVEF 55 to 60%,  moderate LVH, grade 1 diastolic impairment, elevated LAP, pacemaker/ICD lead noted in the RV, mild MR, moderate TR, mild pulmonary hypertension with RVSP 39 mmHg.  Stress Testing:  January 23, 2015:Myocardial perfusion imaging is normal. Overall left ventricular systolic function was normal without regional wall motion abnormalities. The left ventricular ejection fraction was 78%.  This is a normal/low risk study.  Heart Catheterization: 04/2016 Dr. Dixie Dials: Normal coronaries   LABORATORY DATA: CBC Latest Ref Rng & Units 06/01/2020 03/26/2019 11/20/2018  WBC 3.4 - 10.8 x10E3/uL 5.9 6.7 5.8  Hemoglobin 11.1 - 15.9 g/dL 13.4 14.4 12.7  Hematocrit 34.0 - 46.6 % 40.6 44.1 39.7  Platelets 150 - 450 x10E3/uL 221 243 201    CMP Latest Ref Rng & Units 06/01/2020 02/26/2020 08/08/2019  Glucose 65 - 99 mg/dL 84 87 80  BUN 8 - 27 mg/dL 18 19 18   Creatinine 0.57 - 1.00 mg/dL 0.77 0.84 0.85  Sodium 134 - 144 mmol/L 142 142 143  Potassium 3.5 - 5.2 mmol/L 4.0 4.2 4.2  Chloride 96 - 106 mmol/L 102 102 103  CO2 20 - 29 mmol/L 24 25 24   Calcium 8.7 - 10.3 mg/dL 10.3 10.2 10.1  Total Protein 6.0 - 8.5 g/dL 7.0 - -  Total Bilirubin 0.0 - 1.2 mg/dL 0.8 - -  Alkaline Phos 44 - 121 IU/L 125(H) - -  AST 0 - 40 IU/L 18 - -  ALT 0 - 32 IU/L 15 - -    Lipid Panel     Component Value Date/Time   CHOL 163 06/01/2020 1541   TRIG 85 06/01/2020 1541   HDL 63 06/01/2020 1541   CHOLHDL 2.6 06/01/2020 1541   CHOLHDL 2.6 11/08/2016 1035   VLDL 20 09/15/2015 0914   LDLCALC 84 06/01/2020 1541   LDLCALC 75 11/08/2016 1035   LABVLDL 16 06/01/2020 1541    Lab Results  Component Value Date   HGBA1C 5.6 03/26/2019   No components found for: NTPROBNP Lab Results  Component Value Date   TSH 3.360 03/26/2019   TSH 1.360 05/19/2016    Cardiac Panel (last 3 results) No results for input(s): CKTOTAL, CKMB, TROPONINIHS, RELINDX in the last 72 hours.  IMPRESSION:    ICD-10-CM   1. Palpitations  R00.2  diltiazem (CARDIZEM CD) 120 MG 24 hr capsule    2. Benign hypertension  I10     3. Mobitz type 2 second degree AV block  I44.1     4. Pacemaker S/P [employer] MRI model [id number]  Z95.0     5. Mixed hyperlipidemia  E78.2     6. Class 1 obesity  due to excess calories with serious comorbidity and body mass index (BMI) of 34.0 to 34.9 in adult  E66.09    Z68.34        RECOMMENDATIONS: Margaret Reese is a 75 y.o. female whose past medical history and cardiovascular risk factors include: Hypertension, secondary AV block status post pacemaker, hyperlipidemia, postmenopausal female, advanced age, obesity.  Palpitations: Continues to have palpitations at times. Has tolerated initiation of Cardizem well. Most recent remote pacemaker interrogation report reviewed. Given her palpitations recommend holding fish oil for now as there may be an association.  Most recent lipid profile notes a very stable triglyceride level. Continue to monitor. Refilled Cardizem.  Benign essential hypertension: Office blood pressure well controlled. Home blood pressures are very well controlled.   Continue current medical therapy. Low-salt diet recommended.  History of Mobitz type II AV block status post pacemaker implantation: Most recent pacemaker report reviewed with the patient.   Findings noted above for further reference.  Mixed hyperlipidemia: Continue statin therapy.  Patient does not endorse any myalgias.  Given the palpitations/generalized jitteriness patient is concerned that she may have Parkinson's disease.  I have asked her to follow-up with her PCP for further evaluation and management of the symptoms persist.  I like to see her back on an annual basis after her pacemaker check to go over the results or sooner if change in clinical status.  FINAL MEDICATION LIST END OF ENCOUNTER: Meds ordered this encounter  Medications   diltiazem (CARDIZEM CD) 120 MG 24 hr capsule    Sig:  Take 1 capsule (120 mg total) by mouth daily. HOLD IF TOP BLOOD PRESSURE NUMBER LESS THAN 100 MMHG OR HEART RATE LESS THAN 60 PULSE    Dispense:  90 capsule    Refill:  1    **Patient requests 90 days supply**     Medications Discontinued During This Encounter  Medication Reason   fish oil-omega-3 fatty acids 1000 MG capsule Patient Preference   diltiazem (CARDIZEM CD) 120 MG 24 hr capsule Reorder     Current Outpatient Medications:    acetaminophen (TYLENOL) 325 MG tablet, Take 1-2 tablets (325-650 mg total) by mouth every 4 (four) hours as needed for mild pain., Disp: , Rfl:    aspirin 81 MG tablet, Take 81 mg by mouth daily., Disp: , Rfl:    Cholecalciferol (VITAMIN D3) 1000 UNITS CAPS, Take 2 capsules by mouth every evening., Disp: , Rfl:    magnesium oxide (MAG-OX) 400 MG tablet, Take 400 mg by mouth daily., Disp: , Rfl:    Multiple Vitamins-Minerals (PRESERVISION AREDS 2) CAPS, Take 1 capsule by mouth 2 (two) times daily. , Disp: , Rfl:    olmesartan (BENICAR) 20 MG tablet, TAKE 1 TABLET BY MOUTH EVERY DAY, Disp: 90 tablet, Rfl: 0   potassium citrate (UROCIT-K) 10 MEQ (1080 MG) SR tablet, Take 10 mEq by mouth 2 (two) times daily., Disp: , Rfl:    simvastatin (ZOCOR) 20 MG tablet, TAKE 1 TABLET BY MOUTH IN THE EVENING, Disp: 90 tablet, Rfl: 1   diltiazem (CARDIZEM CD) 120 MG 24 hr capsule, Take 1 capsule (120 mg total) by mouth daily. HOLD IF TOP BLOOD PRESSURE NUMBER LESS THAN 100 MMHG OR HEART RATE LESS THAN 60 PULSE, Disp: 90 capsule, Rfl: 1  No orders of the defined types were placed in this encounter.  --Continue cardiac medications as reconciled in final medication list. --Return in about 1 year (around 07/31/2021) for follow up pacemaker . Or sooner  if needed. --Continue follow-up with your primary care physician regarding the management of your other chronic comorbid conditions.  Patient's questions and concerns were addressed to her satisfaction. She voices understanding of  the instructions provided during this encounter.   This note was created using a voice recognition software as a result there may be grammatical errors inadvertently enclosed that do not reflect the nature of this encounter. Every attempt is made to correct such errors.  Rex Kras, [place], [facility]  Pager: [phone] Office: [phone]

## 2020-07-31 NOTE — Addendum Note (Signed)
Addended by: Oran Rein on: 07/31/2020 10:18 AM   Modules accepted: Orders

## 2020-08-04 ENCOUNTER — Other Ambulatory Visit: Payer: Self-pay

## 2020-08-04 ENCOUNTER — Ambulatory Visit (INDEPENDENT_AMBULATORY_CARE_PROVIDER_SITE_OTHER): Payer: Medicare Other | Admitting: Family Medicine

## 2020-08-04 ENCOUNTER — Encounter (INDEPENDENT_AMBULATORY_CARE_PROVIDER_SITE_OTHER): Payer: Self-pay | Admitting: Family Medicine

## 2020-08-04 VITALS — BP 120/73 | HR 80 | Temp 97.8°F | Ht 61.0 in | Wt 178.0 lb

## 2020-08-04 DIAGNOSIS — F3289 Other specified depressive episodes: Secondary | ICD-10-CM | POA: Diagnosis not present

## 2020-08-04 DIAGNOSIS — R002 Palpitations: Secondary | ICD-10-CM

## 2020-08-04 DIAGNOSIS — Z6838 Body mass index (BMI) 38.0-38.9, adult: Secondary | ICD-10-CM

## 2020-08-10 NOTE — Progress Notes (Signed)
Chief Complaint:   OBESITY Margaret Reese is here to discuss her progress with her obesity treatment plan along with follow-up of her obesity related diagnoses.   Today's visit was #: 20 Starting weight: 212 lbs Starting date: 03/26/2019 Today's weight: 178 lbs Today's date: 08/04/2020 Weight change since last visit: 5 lbs Total lbs lost to date: 34 lbs Body mass index is 33.63 kg/m.  Total weight loss percentage to date: -16.04%  Interim History: Margaret Reese says she has had increased family stress and she "binged several times".  She is still feeling "tremors" in the morning.  She says it is an inside feeling.  Current Meal Plan: the Category 1 Plan for 60% of the time.  Current Exercise Plan: Aerobics for 30-40 minutes 5-6 times per week.  Assessment/Plan:   1. Palpitations Suspicious for hypoglycemia.  Will attempt to check blood sugar and have a pre-workout snack.  Discussed anxiety medication.  Blood pressure 104/50s, pulse 80s (high for her).  Previously on beta blocker, which may have also helped with anxiety, tremors, and masking hypoglycemia.   2. Other depression, with emotional eating Not at goal. Medication: None.   Plan:  Discussed cues and consequences, how thoughts affect eating, model of thoughts, feelings, and behaviors, and strategies for change by focusing on the cue. Discussed cognitive distortions, coping thoughts, and how to change your thoughts.  She is not interested in seeing Dr. Mallie Mussel or a new medication at this time.  3. Obesity, current BMI 33.7  Course: Margaret Reese is currently in the action stage of change. As such, her goal is to continue with weight loss efforts.   Nutrition goals: She has agreed to the Category 1 Plan.   Exercise goals:  As is.  Behavioral modification strategies: increasing lean protein intake, decreasing simple carbohydrates, increasing vegetables, and increasing water intake.  Margaret Reese has agreed to follow-up with our clinic in 4 weeks. She  was informed of the importance of frequent follow-up visits to maximize her success with intensive lifestyle modifications for her multiple health conditions.   Objective:   Blood pressure 120/73, pulse 80, temperature 97.8 F (36.6 C), temperature source Oral, height 5\' 1"  (1.549 m), weight 178 lb (80.7 kg), SpO2 95 %. Body mass index is 33.63 kg/m.  General: Cooperative, alert, well developed, in no acute distress. HEENT: Conjunctivae and lids unremarkable. Cardiovascular: Regular rhythm.  Lungs: Normal work of breathing. Neurologic: No focal deficits.   Lab Results  Component Value Date   CREATININE 0.77 06/01/2020   BUN 18 06/01/2020   NA 142 06/01/2020   K 4.0 06/01/2020   CL 102 06/01/2020   CO2 24 06/01/2020   Lab Results  Component Value Date   ALT 15 06/01/2020   AST 18 06/01/2020   ALKPHOS 125 (H) 06/01/2020   BILITOT 0.8 06/01/2020   Lab Results  Component Value Date   HGBA1C 5.6 03/26/2019   Lab Results  Component Value Date   INSULIN 8.1 03/26/2019   Lab Results  Component Value Date   TSH 3.360 03/26/2019   Lab Results  Component Value Date   CHOL 163 06/01/2020   HDL 63 06/01/2020   LDLCALC 84 06/01/2020   TRIG 85 06/01/2020   CHOLHDL 2.6 06/01/2020   Lab Results  Component Value Date   WBC 5.9 06/01/2020   HGB 13.4 06/01/2020   HCT 40.6 06/01/2020   MCV 87 06/01/2020   PLT 221 06/01/2020   Lab Results  Component Value Date   IRON  64 03/26/2019   TIBC 301 03/26/2019   FERRITIN 260 (H) 03/26/2019   Obesity Behavioral Intervention:   Approximately 15 minutes were spent on the discussion below.  ASK: We discussed the diagnosis of obesity with Margaret Reese today and Margaret Reese agreed to give [name] permission to discuss obesity behavioral modification therapy today.  ASSESS: Margaret Reese has the diagnosis of obesity and her BMI today is 33.7. Margaret Reese is in the action stage of change.   ADVISE: Margaret Reese was educated on the multiple health risks of obesity as  well as the benefit of weight loss to improve her health. She was advised of the need for long term treatment and the importance of lifestyle modifications to improve her current health and to decrease her risk of future health problems.  AGREE: Multiple dietary modification options and treatment options were discussed and Margaret Reese agreed to follow the recommendations documented in the above note.  ARRANGE: Margaret Reese was educated on the importance of frequent visits to treat obesity as outlined per CMS and USPSTF guidelines and agreed to schedule her next follow up appointment today.  Attestation Statements:   Reviewed by clinician on day of visit: allergies, medications, problem list, medical history, surgical history, family history, social history, and previous encounter notes.  I, Water quality scientist, CMA, am acting as transcriptionist for [name], DO  I have reviewed the above documentation for accuracy and completeness, and I agree with the above. [name], DO

## 2020-08-20 DIAGNOSIS — I10 Essential (primary) hypertension: Secondary | ICD-10-CM | POA: Diagnosis not present

## 2020-09-06 DIAGNOSIS — M722 Plantar fascial fibromatosis: Secondary | ICD-10-CM | POA: Diagnosis not present

## 2020-09-08 ENCOUNTER — Ambulatory Visit (INDEPENDENT_AMBULATORY_CARE_PROVIDER_SITE_OTHER): Payer: Medicare Other | Admitting: Family Medicine

## 2020-09-08 DIAGNOSIS — Z45018 Encounter for adjustment and management of other part of cardiac pacemaker: Secondary | ICD-10-CM | POA: Diagnosis not present

## 2020-09-08 DIAGNOSIS — I441 Atrioventricular block, second degree: Secondary | ICD-10-CM | POA: Diagnosis not present

## 2020-09-08 DIAGNOSIS — Z950 Presence of cardiac pacemaker: Secondary | ICD-10-CM | POA: Diagnosis not present

## 2020-09-25 DIAGNOSIS — I10 Essential (primary) hypertension: Secondary | ICD-10-CM | POA: Diagnosis not present

## 2020-10-06 ENCOUNTER — Ambulatory Visit (INDEPENDENT_AMBULATORY_CARE_PROVIDER_SITE_OTHER): Payer: Medicare Other | Admitting: Family Medicine

## 2020-10-11 ENCOUNTER — Other Ambulatory Visit: Payer: Self-pay | Admitting: Cardiology

## 2020-10-11 DIAGNOSIS — I10 Essential (primary) hypertension: Secondary | ICD-10-CM

## 2020-10-14 ENCOUNTER — Ambulatory Visit (INDEPENDENT_AMBULATORY_CARE_PROVIDER_SITE_OTHER): Payer: Medicare Other | Admitting: Family Medicine

## 2020-10-14 ENCOUNTER — Encounter (INDEPENDENT_AMBULATORY_CARE_PROVIDER_SITE_OTHER): Payer: Self-pay | Admitting: Family Medicine

## 2020-10-14 ENCOUNTER — Other Ambulatory Visit: Payer: Self-pay

## 2020-10-14 VITALS — BP 144/78 | HR 81 | Temp 97.8°F | Ht 61.0 in | Wt 183.0 lb

## 2020-10-14 DIAGNOSIS — Z6838 Body mass index (BMI) 38.0-38.9, adult: Secondary | ICD-10-CM

## 2020-10-14 DIAGNOSIS — F3289 Other specified depressive episodes: Secondary | ICD-10-CM

## 2020-10-14 DIAGNOSIS — F418 Other specified anxiety disorders: Secondary | ICD-10-CM | POA: Diagnosis not present

## 2020-10-14 DIAGNOSIS — E8881 Metabolic syndrome: Secondary | ICD-10-CM

## 2020-10-14 NOTE — Progress Notes (Signed)
Chief Complaint:   OBESITY Margaret Reese is here to discuss her progress with her obesity treatment plan along with follow-up of her obesity related diagnoses. See Medical Weight Management Flowsheet for complete bioelectrical impedance results.  Today's visit was #: 21 Starting weight: 212 lbs Starting date: 03/26/2019 Weight change since last visit: +5 lbs Total lbs lost to date: 29 lbs Total weight loss percentage to date: -13.68%  Nutrition Plan: Category 1 Plan for 0% of the time. Activity: Aerobics for 30-40 minutes 2-6 times per week.  Interim History: Margaret Reese says she hurt her foot and her husband had surgery, so she could not exercise.  She got up to 189 pounds and is now back to 183.  We discussed palpitations at her last visit.  Suspected hypoglycemia.  Palpitations have improved since she is eating before she exercises now.    Assessment/Plan:   1. Metabolic syndrome Starting goal: Lose 7-10% of starting weight. She will continue to focus on protein-rich, low simple carbohydrate foods. We reviewed the importance of hydration, regular exercise for stress reduction, and restorative sleep.  We will continue to check lab work every 3 months, with 10% weight loss, or should any other concerns arise.  2. Situational anxiety Behavior modification techniques were discussed today to help Margaret Reese deal with her anxiety.  3. Other depression, with emotional eating Not at goal. Medication: None.  Plan:  Discussed cues and consequences, how thoughts affect eating, model of thoughts, feelings, and behaviors, and strategies for change by focusing on the cue. Discussed cognitive distortions, coping thoughts, and how to change your thoughts.  4. Obesity, current BMI 34.6  Course: Margaret Reese is currently in the action stage of change. As such, her goal is to continue with weight loss efforts.   Nutrition goals: She has agreed to the Category 1 Plan.   Exercise goals:  As is.  Behavioral  modification strategies: increasing lean protein intake, decreasing simple carbohydrates, increasing vegetables, increasing water intake, and no skipping meals.  Margaret Reese has agreed to follow-up with our clinic in 4 weeks. She was informed of the importance of frequent follow-up visits to maximize her success with intensive lifestyle modifications for her multiple health conditions.   Objective:   Blood pressure (!) 144/78, pulse 81, temperature 97.8 F (36.6 C), temperature source Oral, height '5\' 1"'$  (1.549 m), weight 183 lb (83 kg), SpO2 95 %. Body mass index is 34.58 kg/m.  General: Cooperative, alert, well developed, in no acute distress. HEENT: Conjunctivae and lids unremarkable. Cardiovascular: Regular rhythm.  Lungs: Normal work of breathing. Neurologic: No focal deficits.   Lab Results  Component Value Date   CREATININE 0.77 06/01/2020   BUN 18 06/01/2020   NA 142 06/01/2020   K 4.0 06/01/2020   CL 102 06/01/2020   CO2 24 06/01/2020   Lab Results  Component Value Date   ALT 15 06/01/2020   AST 18 06/01/2020   ALKPHOS 125 (H) 06/01/2020   BILITOT 0.8 06/01/2020   Lab Results  Component Value Date   HGBA1C 5.6 03/26/2019   Lab Results  Component Value Date   INSULIN 8.1 03/26/2019   Lab Results  Component Value Date   TSH 3.360 03/26/2019   Lab Results  Component Value Date   CHOL 163 06/01/2020   HDL 63 06/01/2020   LDLCALC 84 06/01/2020   TRIG 85 06/01/2020   CHOLHDL 2.6 06/01/2020   Lab Results  Component Value Date   VD25OH 31.6 03/26/2019   Lab Results  Component Value Date   WBC 5.9 06/01/2020   HGB 13.4 06/01/2020   HCT 40.6 06/01/2020   MCV 87 06/01/2020   PLT 221 06/01/2020   Lab Results  Component Value Date   IRON 64 03/26/2019   TIBC 301 03/26/2019   FERRITIN 260 (H) 03/26/2019   Obesity Behavioral Intervention:   Approximately 15 minutes were spent on the discussion below.  ASK: We discussed the diagnosis of obesity with Margaret Reese  today and Margaret Reese agreed to give Margaret Reese permission to discuss obesity behavioral modification therapy today.  ASSESS: Margaret Reese has the diagnosis of obesity and her BMI today is 34.6. Margaret Reese is in the action stage of change.   ADVISE: Margaret Reese was educated on the multiple health risks of obesity as well as the benefit of weight loss to improve her health. She was advised of the need for long term treatment and the importance of lifestyle modifications to improve her current health and to decrease her risk of future health problems.  AGREE: Multiple dietary modification options and treatment options were discussed and Margaret Reese agreed to follow the recommendations documented in the above note.  ARRANGE: Margaret Reese was educated on the importance of frequent visits to treat obesity as outlined per CMS and USPSTF guidelines and agreed to schedule her next follow up appointment today.  Attestation Statements:   Reviewed by clinician on day of visit: allergies, medications, problem list, medical history, surgical history, family history, social history, and previous encounter notes.  I, Water quality scientist, CMA, am acting as transcriptionist for Margaret Deutscher, DO  I have reviewed the above documentation for accuracy and completeness, and I agree with the above. Margaret Deutscher, DO

## 2020-10-26 DIAGNOSIS — I10 Essential (primary) hypertension: Secondary | ICD-10-CM | POA: Diagnosis not present

## 2020-11-03 DIAGNOSIS — Z23 Encounter for immunization: Secondary | ICD-10-CM | POA: Diagnosis not present

## 2020-11-11 ENCOUNTER — Ambulatory Visit: Payer: Medicare Other | Admitting: Cardiology

## 2020-11-11 ENCOUNTER — Other Ambulatory Visit: Payer: Self-pay

## 2020-11-11 ENCOUNTER — Encounter: Payer: Self-pay | Admitting: Cardiology

## 2020-11-11 VITALS — BP 146/75 | HR 87 | Temp 98.0°F | Resp 16 | Ht 61.0 in | Wt 186.0 lb

## 2020-11-11 DIAGNOSIS — Z950 Presence of cardiac pacemaker: Secondary | ICD-10-CM

## 2020-11-11 DIAGNOSIS — I441 Atrioventricular block, second degree: Secondary | ICD-10-CM

## 2020-11-11 DIAGNOSIS — Z45018 Encounter for adjustment and management of other part of cardiac pacemaker: Secondary | ICD-10-CM

## 2020-11-11 NOTE — Progress Notes (Signed)
Chief Complaint  Patient presents with   Pacemaker Check         Scheduled  In office pacemaker check 11/11/20  Single (S)/Dual (D)/BV: D. Presenting ASVP. Pacemaker dependant:  Yes. Underlying Complete heart block. AP 22%, VP 100%.  AMS Episodes 7.  AT/AF burden <1% . Longest 1 min 20 S. AT HVR 0.  Longevity 4.9 Years. Magnet rate: >85%. Lead measurements: Stable. Histogram: Low (L)/normal (N)/high (H)  Normal. Patient activity Good.   Observations: Normal pacemaker function. Changes: Turn off VIP (patient is pacemaker dependant). VP limit alert turned off.     ICD-10-CM   1. Encounter for interrogation of cardiac pacemaker  Z45.018     2. Pacemaker S/P [employer] Jude Medical Assurity MRI model [id number]  Z95.0     3. Mobitz type 2 second degree AV block  I44.1         Adrian Prows, MD, [facility] 11/11/2020, 3:10 PM Office: [phone] Fax: [phone] Pager: [phone]

## 2020-11-16 ENCOUNTER — Telehealth (INDEPENDENT_AMBULATORY_CARE_PROVIDER_SITE_OTHER): Payer: Self-pay

## 2020-11-16 DIAGNOSIS — Z23 Encounter for immunization: Secondary | ICD-10-CM | POA: Diagnosis not present

## 2020-11-18 ENCOUNTER — Other Ambulatory Visit: Payer: Self-pay

## 2020-11-18 ENCOUNTER — Ambulatory Visit (INDEPENDENT_AMBULATORY_CARE_PROVIDER_SITE_OTHER): Payer: Medicare Other | Admitting: Family Medicine

## 2020-11-18 ENCOUNTER — Encounter (INDEPENDENT_AMBULATORY_CARE_PROVIDER_SITE_OTHER): Payer: Self-pay | Admitting: Family Medicine

## 2020-11-18 VITALS — BP 128/72 | HR 81 | Temp 97.7°F | Ht 61.0 in | Wt 179.0 lb

## 2020-11-18 DIAGNOSIS — E8881 Metabolic syndrome: Secondary | ICD-10-CM | POA: Diagnosis not present

## 2020-11-18 DIAGNOSIS — E65 Localized adiposity: Secondary | ICD-10-CM

## 2020-11-18 DIAGNOSIS — Z6838 Body mass index (BMI) 38.0-38.9, adult: Secondary | ICD-10-CM | POA: Diagnosis not present

## 2020-11-19 NOTE — Progress Notes (Signed)
Chief Complaint:   OBESITY Margaret Reese is here to discuss her progress with her obesity treatment plan along with follow-up of her obesity related diagnoses.   Today's visit was #: 22 Starting weight: 212 lbs Starting date: 03/26/2019 Today's weight: 179 lbs Today's date: 11/18/2020 Weight change since last visit: 4 lbs Total lbs lost to date: 33 lbs Body mass index is 33.82 kg/m.  Total weight loss percentage to date: -15.57%  Current Meal Plan: the Category 1 Plan for 40-50% of the time.  Current Exercise Plan: Aerobics for 30-40 minutes 6 times per week.  Interim History:  Margaret Reese says she got her COVID booster and would like to start getting out more often.  She has had no palpitations since eating before working out.  She says her husband's health is improving, so her sleep is improving.  Assessment/Plan:   1. Metabolic syndrome Starting goal MET: Lose 7-10% of starting weight. She will continue to focus on protein-rich, low simple carbohydrate foods. We reviewed the importance of hydration, regular exercise for stress reduction, and restorative sleep.  We will continue to check lab work every 3 months, with 10% weight loss, or should any other concerns arise.  2. Visceral obesity Current visceral fat rating: 15. Visceral fat rating should be < 13. Visceral adipose tissue is a hormonally active component of total body fat. This body composition phenotype is associated with medical disorders such as metabolic syndrome, cardiovascular disease and several malignancies including prostate, breast, and colorectal cancers.   3. Obesity, current BMI 33.9  Course: Margaret Reese is currently in the action stage of change. As such, her goal is to continue with weight loss efforts.   Nutrition goals: She has agreed to the Category 1 Plan.   Exercise goals:  As is.  Behavioral modification strategies: increasing lean protein intake, decreasing simple carbohydrates, increasing vegetables, and  increasing water intake.  Margaret Reese has agreed to follow-up with our clinic in 4-5 weeks. She was informed of the importance of frequent follow-up visits to maximize her success with intensive lifestyle modifications for her multiple health conditions.   Objective:   Blood pressure 128/72, pulse 81, temperature 97.7 F (36.5 C), temperature source Oral, height 5' 1" (1.549 m), weight 179 lb (81.2 kg), SpO2 95 %. Body mass index is 33.82 kg/m.  General: Cooperative, alert, well developed, in no acute distress. HEENT: Conjunctivae and lids unremarkable. Cardiovascular: Regular rhythm.  Lungs: Normal work of breathing. Neurologic: No focal deficits.   Lab Results  Component Value Date   CREATININE 0.77 06/01/2020   BUN 18 06/01/2020   NA 142 06/01/2020   K 4.0 06/01/2020   CL 102 06/01/2020   CO2 24 06/01/2020   Lab Results  Component Value Date   ALT 15 06/01/2020   AST 18 06/01/2020   ALKPHOS 125 (H) 06/01/2020   BILITOT 0.8 06/01/2020   Lab Results  Component Value Date   HGBA1C 5.6 03/26/2019   Lab Results  Component Value Date   INSULIN 8.1 03/26/2019   Lab Results  Component Value Date   TSH 3.360 03/26/2019   Lab Results  Component Value Date   CHOL 163 06/01/2020   HDL 63 06/01/2020   LDLCALC 84 06/01/2020   TRIG 85 06/01/2020   CHOLHDL 2.6 06/01/2020   Lab Results  Component Value Date   VD25OH 31.6 03/26/2019   Lab Results  Component Value Date   WBC 5.9 06/01/2020   HGB 13.4 06/01/2020   HCT 40.6 06/01/2020  MCV 87 06/01/2020   PLT 221 06/01/2020   Lab Results  Component Value Date   IRON 64 03/26/2019   TIBC 301 03/26/2019   FERRITIN 260 (H) 03/26/2019   Attestation Statements:   Reviewed by clinician on day of visit: allergies, medications, problem list, medical history, surgical history, family history, social history, and previous encounter notes.  Time spent on visit including pre-visit chart review and post-visit care and charting  was 34 minutes.   I, Water quality scientist, CMA, am acting as transcriptionist for [name], DO  I have reviewed the above documentation for accuracy and completeness, and I agree with the above. [name], DO

## 2020-11-25 DIAGNOSIS — I10 Essential (primary) hypertension: Secondary | ICD-10-CM | POA: Diagnosis not present

## 2020-12-07 DIAGNOSIS — Z45018 Encounter for adjustment and management of other part of cardiac pacemaker: Secondary | ICD-10-CM | POA: Diagnosis not present

## 2020-12-07 DIAGNOSIS — I441 Atrioventricular block, second degree: Secondary | ICD-10-CM | POA: Diagnosis not present

## 2020-12-07 DIAGNOSIS — Z950 Presence of cardiac pacemaker: Secondary | ICD-10-CM | POA: Diagnosis not present

## 2020-12-20 ENCOUNTER — Encounter: Payer: Self-pay | Admitting: Pharmacist

## 2020-12-20 NOTE — Progress Notes (Unsigned)
CARE PLAN ENTRY  12/20/2020 Name: Margaret Reese MRN: [id number] DOB: 02/01/1946  Margaret Reese is enrolled in Remote Patient Monitoring/Principle Care Monitoring.  Date of Enrollment: 08/28/2019 Supervising physician: Rex Kras Indication: HTN  Remote Readings: Compliant and Avg BP: 113/59, HR:72  Next scheduled OV: 08/12/21  Pharmacist Clinical Goal(s):  Over the next 90 days, patient will demonstrate Improved medication adherence as evidenced by medication fill history Over the next 90 days, patient will demonstrate improved understanding of prescribed medications and rationale for usage as evidenced by patient teach back Over the next 90 days, patient will experience decrease in ED visits. ED visits in last 6 months = 0 Over the next 90 days, patient will not experience hospital admission. Hospital Admissions in last 6 months = 0  Interventions: Provider and Inter-disciplinary care team collaboration (see longitudinal plan of care) Comprehensive medication review performed. Discussed plans with patient for ongoing care management follow up and provided patient with direct contact information for care management team Collaboration with provider re: medication management  Patient Self Care Activities:  Self administers medications as prescribed Attends all scheduled provider appointments Performs ADL's independently Performs IADL's independently  Allergies  Allergen Reactions   Toprol Xl [Metoprolol Tartrate]     Depressive mood.    Outpatient Encounter Medications as of 12/20/2020  Medication Sig   acetaminophen (TYLENOL) 325 MG tablet Take 1-2 tablets (325-650 mg total) by mouth every 4 (four) hours as needed for mild pain.   aspirin 81 MG tablet Take 81 mg by mouth daily.   Cholecalciferol (VITAMIN D3) 1000 UNITS CAPS Take 2 capsules by mouth every evening.   diltiazem (CARDIZEM CD) 120 MG 24 hr capsule Take 1 capsule (120 mg total) by mouth daily. HOLD IF TOP  BLOOD PRESSURE NUMBER LESS THAN 100 MMHG OR HEART RATE LESS THAN 60 PULSE   magnesium oxide (MAG-OX) 400 MG tablet Take 400 mg by mouth daily.   Multiple Vitamins-Minerals (PRESERVISION AREDS 2) CAPS Take 1 capsule by mouth 2 (two) times daily.    olmesartan (BENICAR) 20 MG tablet TAKE 1 TABLET BY MOUTH EVERY DAY   potassium citrate (UROCIT-K) 10 MEQ (1080 MG) SR tablet Take 10 mEq by mouth 2 (two) times daily.   simvastatin (ZOCOR) 20 MG tablet TAKE 1 TABLET BY MOUTH IN THE EVENING   No facility-administered encounter medications on file as of 12/20/2020.    Hypertension   BP goal is:  <130/80  Office blood pressures are  BP Readings from Last 3 Encounters:  11/18/20 128/72  11/11/20 (!) 146/75  10/14/20 (!) 144/78    Patient is currently controlled on the following medications: Olmesartan 20 mg, Diltiazem 120 mg  Patient checks BP at home daily  Patient home BP readings are ranging: 106-124/53-68  Patient has tried  these meds in the past: Lisinopril-HCTZ, metoprolol,   We discussed diet and exercise extensively  Plan  Continue current medications and control with diet and exercise   BP continues to remain stable and at goal. Pt being managed closely by the weight management clinic and continues to work on weight loss goals. Recent pacemaker check which shows no abnormalities.   ______________ Visit Information SDOH (Social Determinants of Health) assessments performed: Yes.  Margaret Reese was given information about Principle Care Management/Remote Patient Monitoring services today including:  RPM/PCM service includes personalized support from designated clinical staff supervised by her physician, including individualized plan of care and coordination with other care providers 24/7 contact phone numbers for  assistance for urgent and routine care needs. Standard insurance, coinsurance, copays and deductibles apply for principle care management only during months in which  we provide at least 30 minutes of these services. Most insurances cover these services at 100%, however patients may be responsible for any copay, coinsurance and/or deductible if applicable. This service may help you avoid the need for more expensive face-to-face services. Only one practitioner may furnish and bill the service in a calendar month. The patient may stop PCM/RPM services at any time (effective at the end of the month) by phone call to the office staff.  Patient agreed to services and verbal consent obtained.   Margaret Reese, Pharm.D. [facility] [phone] Ext: 120

## 2020-12-23 ENCOUNTER — Ambulatory Visit (INDEPENDENT_AMBULATORY_CARE_PROVIDER_SITE_OTHER): Payer: Medicare Other | Admitting: Family Medicine

## 2020-12-26 DIAGNOSIS — I10 Essential (primary) hypertension: Secondary | ICD-10-CM | POA: Diagnosis not present

## 2021-01-05 DIAGNOSIS — Z1231 Encounter for screening mammogram for malignant neoplasm of breast: Secondary | ICD-10-CM | POA: Diagnosis not present

## 2021-01-05 LAB — HM MAMMOGRAPHY

## 2021-01-07 ENCOUNTER — Other Ambulatory Visit: Payer: Self-pay | Admitting: Cardiology

## 2021-01-07 ENCOUNTER — Other Ambulatory Visit: Payer: Self-pay

## 2021-01-07 DIAGNOSIS — I10 Essential (primary) hypertension: Secondary | ICD-10-CM

## 2021-01-07 DIAGNOSIS — E785 Hyperlipidemia, unspecified: Secondary | ICD-10-CM

## 2021-01-07 MED ORDER — SIMVASTATIN 20 MG PO TABS: 20.0000 mg | ORAL_TABLET | Freq: Every evening | ORAL | 1 refills | Status: DC

## 2021-01-12 NOTE — Telephone Encounter (Signed)
error 

## 2021-01-21 ENCOUNTER — Ambulatory Visit (INDEPENDENT_AMBULATORY_CARE_PROVIDER_SITE_OTHER): Payer: Medicare Other | Admitting: Family Medicine

## 2021-01-25 DIAGNOSIS — I10 Essential (primary) hypertension: Secondary | ICD-10-CM | POA: Diagnosis not present

## 2021-01-26 ENCOUNTER — Encounter: Payer: Self-pay | Admitting: Family Medicine

## 2021-01-26 ENCOUNTER — Other Ambulatory Visit: Payer: Self-pay

## 2021-01-26 ENCOUNTER — Telehealth (INDEPENDENT_AMBULATORY_CARE_PROVIDER_SITE_OTHER): Payer: Medicare Other | Admitting: Family Medicine

## 2021-01-26 VITALS — BP 117/59 | HR 73 | Temp 98.3°F | Wt 183.4 lb

## 2021-01-26 DIAGNOSIS — J069 Acute upper respiratory infection, unspecified: Secondary | ICD-10-CM

## 2021-01-26 NOTE — Progress Notes (Signed)
   Subjective:    Patient ID: [name], female    DOB: [birth year], 75 y.o.   MRN: [id number]  HPI Documentation for virtual audio  telecommunications through [place] encounter: The patient was located at home. 2 patient identifiers used.  Could not get good computer hookup. The provider was located in the office. The patient did consent to this visit and is aware of possible charges through their insurance for this visit. The other persons participating in this telemedicine service were none. Time spent on call was 5 minutes and in review of previous records >15 minutes total for counseling and coordination of care. This virtual service is not related to other E/M service within previous 7 days.  She states that last Wednesday she developed rhinorrhea, postnasal drainage, dry cough that has now become slightly productive.  No fever, chills or sore throat.  She thinks that she might be holding her own right now.  Review of Systems     Objective:   Physical Exam Alert and in no distress.  Her voice sounds normal.       Assessment & Plan:  Viral URI with cough Recommend Robitussin-DM to help with coughing as well as Tylenol.  Recommend she see how she does over the next several days and if she improves, no further intervention necessary but if she gets worse, I will call in an antibiotic.  She was comfortable with that.

## 2021-01-28 ENCOUNTER — Other Ambulatory Visit: Payer: Self-pay | Admitting: Cardiology

## 2021-01-28 ENCOUNTER — Telehealth (INDEPENDENT_AMBULATORY_CARE_PROVIDER_SITE_OTHER): Payer: Medicare Other | Admitting: Family Medicine

## 2021-01-28 DIAGNOSIS — R002 Palpitations: Secondary | ICD-10-CM

## 2021-01-29 ENCOUNTER — Telehealth: Payer: Self-pay | Admitting: Family Medicine

## 2021-01-29 MED ORDER — AMOXICILLIN 875 MG PO TABS: 875.0000 mg | ORAL_TABLET | Freq: Two times a day (BID) | ORAL | 0 refills | Status: DC

## 2021-01-29 NOTE — Telephone Encounter (Signed)
Pt called and states that she is still having the real bad cough, she is having a hard time to get the mucous up but when she does it is all green, said to cal back if she was not better so she is wanting to know if she can get a antibiotocs Pt uses [id number] - [place], [place] - [address] AT [place]

## 2021-02-18 ENCOUNTER — Encounter (INDEPENDENT_AMBULATORY_CARE_PROVIDER_SITE_OTHER): Payer: Self-pay | Admitting: Family Medicine

## 2021-02-18 ENCOUNTER — Other Ambulatory Visit: Payer: Self-pay

## 2021-02-18 ENCOUNTER — Ambulatory Visit (INDEPENDENT_AMBULATORY_CARE_PROVIDER_SITE_OTHER): Payer: Medicare Other | Admitting: Family Medicine

## 2021-02-18 VITALS — BP 146/74 | HR 86 | Temp 97.6°F | Ht 61.0 in | Wt 181.0 lb

## 2021-02-18 DIAGNOSIS — I10 Essential (primary) hypertension: Secondary | ICD-10-CM | POA: Diagnosis not present

## 2021-02-18 DIAGNOSIS — Z6838 Body mass index (BMI) 38.0-38.9, adult: Secondary | ICD-10-CM

## 2021-02-18 DIAGNOSIS — F418 Other specified anxiety disorders: Secondary | ICD-10-CM | POA: Diagnosis not present

## 2021-02-18 DIAGNOSIS — E65 Localized adiposity: Secondary | ICD-10-CM | POA: Diagnosis not present

## 2021-02-18 NOTE — Progress Notes (Signed)
Chief Complaint:   OBESITY Margaret Reese is here to discuss her progress with her obesity treatment plan along with follow-up of her obesity related diagnoses. See Medical Weight Management Flowsheet for complete bioelectrical impedance results.  Today's visit was #: 23 Starting weight: 212 lbs Starting date: 03/26/2019 Weight change since last visit: +2 lbs Total lbs lost to date: 31 lbs Total weight loss percentage to date: -14.62%  Nutrition Plan: Category 1 Plan for 25% of the time.  Activity: Aerobics for 30-40 minutes 5-6 times per week.   Interim History: Margaret Reese says she was under more stress over the holidays and had a viral illness for 2 weeks.  Her 76 year old granddaughter with autism is struggling.  She says she is not being mindful of food choices.  Assessment/Plan:   1. Essential hypertension Elevated today. Medications: diltiazem 120 mg daily, Benicar 20 mg daily.   Plan:  Continue diltiazem and Benicar at current doses.  Avoid buying foods that are: processed, frozen, or prepackaged to avoid excess salt. We will watch for signs of hypotension as she continues lifestyle modifications.  BP Readings from Last 3 Encounters:  02/18/21 (!) 146/74  01/26/21 (!) 117/59  11/18/20 128/72   Lab Results  Component Value Date   CREATININE 0.77 06/01/2020   2. Visceral obesity Current visceral fat rating: 15. Visceral fat rating goal is < 13. Visceral adipose tissue is a hormonally active component of total body fat. This body composition phenotype is associated with medical disorders such as metabolic syndrome, cardiovascular disease, and several malignancies including prostate, breast, and colorectal cancers. Starting goal: Lose 7-10% of starting weight.   3. Situational anxiety Behavior modification techniques were discussed today to help Margaret Reese deal with her anxiety.  Orders and follow up as documented in patient record.   4. Obesity, current BMI 34.3  Course: Margaret Reese is  currently in the action stage of change. As such, her goal is to continue with weight loss efforts.   Nutrition goals: She has agreed to the Category 1 Plan.   Exercise goals:  As is.  Behavioral modification strategies: increasing lean protein intake, decreasing simple carbohydrates, increasing vegetables, increasing water intake, decreasing liquid calories, and keeping a strict food journal.  Margaret Reese has agreed to follow-up with our clinic in 3 weeks. She was informed of the importance of frequent follow-up visits to maximize her success with intensive lifestyle modifications for her multiple health conditions.   Objective:   Blood pressure (!) 146/74, pulse 86, temperature 97.6 F (36.4 C), temperature source Oral, height 5\' 1"  (1.549 m), weight 181 lb (82.1 kg), SpO2 96 %. Body mass index is 34.2 kg/m.  General: Cooperative, alert, well developed, in no acute distress. HEENT: Conjunctivae and lids unremarkable. Cardiovascular: Regular rhythm.  Lungs: Normal work of breathing. Neurologic: No focal deficits.   Lab Results  Component Value Date   CREATININE 0.77 06/01/2020   BUN 18 06/01/2020   NA 142 06/01/2020   K 4.0 06/01/2020   CL 102 06/01/2020   CO2 24 06/01/2020   Lab Results  Component Value Date   ALT 15 06/01/2020   AST 18 06/01/2020   ALKPHOS 125 (H) 06/01/2020   BILITOT 0.8 06/01/2020   Lab Results  Component Value Date   HGBA1C 5.6 03/26/2019   Lab Results  Component Value Date   INSULIN 8.1 03/26/2019   Lab Results  Component Value Date   TSH 3.360 03/26/2019   Lab Results  Component Value Date   CHOL  163 06/01/2020   HDL 63 06/01/2020   LDLCALC 84 06/01/2020   TRIG 85 06/01/2020   CHOLHDL 2.6 06/01/2020   Lab Results  Component Value Date   VD25OH 31.6 03/26/2019   Lab Results  Component Value Date   WBC 5.9 06/01/2020   HGB 13.4 06/01/2020   HCT 40.6 06/01/2020   MCV 87 06/01/2020   PLT 221 06/01/2020   Lab Results  Component  Value Date   IRON 64 03/26/2019   TIBC 301 03/26/2019   FERRITIN 260 (H) 03/26/2019   Attestation Statements:   Reviewed by clinician on day of visit: allergies, medications, problem list, medical history, surgical history, family history, social history, and previous encounter notes.  I, Water quality scientist, CMA, am acting as transcriptionist for [name], DO  I have reviewed the above documentation for accuracy and completeness, and I agree with the above. -  [name], DO, MS, FAAFP, DABOM - Family and Bariatric Medicine.

## 2021-02-25 DIAGNOSIS — I10 Essential (primary) hypertension: Secondary | ICD-10-CM | POA: Diagnosis not present

## 2021-03-08 DIAGNOSIS — Z45018 Encounter for adjustment and management of other part of cardiac pacemaker: Secondary | ICD-10-CM | POA: Diagnosis not present

## 2021-03-08 DIAGNOSIS — I441 Atrioventricular block, second degree: Secondary | ICD-10-CM | POA: Diagnosis not present

## 2021-03-25 DIAGNOSIS — N200 Calculus of kidney: Secondary | ICD-10-CM | POA: Diagnosis not present

## 2021-03-25 DIAGNOSIS — N281 Cyst of kidney, acquired: Secondary | ICD-10-CM | POA: Diagnosis not present

## 2021-03-28 DIAGNOSIS — I10 Essential (primary) hypertension: Secondary | ICD-10-CM | POA: Diagnosis not present

## 2021-04-05 ENCOUNTER — Ambulatory Visit (INDEPENDENT_AMBULATORY_CARE_PROVIDER_SITE_OTHER): Payer: Medicare Other | Admitting: Family Medicine

## 2021-04-05 ENCOUNTER — Other Ambulatory Visit: Payer: Self-pay | Admitting: Cardiology

## 2021-04-05 ENCOUNTER — Encounter (INDEPENDENT_AMBULATORY_CARE_PROVIDER_SITE_OTHER): Payer: Self-pay | Admitting: Family Medicine

## 2021-04-05 ENCOUNTER — Other Ambulatory Visit: Payer: Self-pay

## 2021-04-05 VITALS — BP 139/81 | HR 95 | Temp 97.9°F | Ht 61.0 in | Wt 175.0 lb

## 2021-04-05 DIAGNOSIS — I10 Essential (primary) hypertension: Secondary | ICD-10-CM

## 2021-04-05 DIAGNOSIS — E669 Obesity, unspecified: Secondary | ICD-10-CM

## 2021-04-05 DIAGNOSIS — E7800 Pure hypercholesterolemia, unspecified: Secondary | ICD-10-CM

## 2021-04-05 DIAGNOSIS — Z6838 Body mass index (BMI) 38.0-38.9, adult: Secondary | ICD-10-CM

## 2021-04-05 DIAGNOSIS — F418 Other specified anxiety disorders: Secondary | ICD-10-CM

## 2021-04-05 DIAGNOSIS — Z6833 Body mass index (BMI) 33.0-33.9, adult: Secondary | ICD-10-CM | POA: Diagnosis not present

## 2021-04-06 NOTE — Progress Notes (Signed)
Chief Complaint:   OBESITY Margaret Reese is here to discuss her progress with her obesity treatment plan along with follow-up of her obesity related diagnoses. See Medical Weight Management Flowsheet for complete bioelectrical impedance results.  Today's visit was #: 24 Starting weight: 212 lbs Starting date: 03/26/2019 Weight change since last visit: 6 lbs Total lbs lost to date: 37 lbs Total weight loss percentage to date: -17.45%  Nutrition Plan: Category 1 Plan for 50% of the time.  Activity: Aerobics for 25-40 minutes 6 times per week.   Interim History: Margaret Reese says she started journaling.  She has also increased her exercise.  She says she is happy with her progress.  She struggles with emotional eating - granddaughter with autism/anger outbursts.   Assessment/Plan:   1. Essential hypertension At goal. Medications: olmesartan 20 mg daily.   Plan: Avoid buying foods that are: processed, frozen, or prepackaged to avoid excess salt. We will watch for signs of hypotension as she continues lifestyle modifications.  BP Readings from Last 3 Encounters:  04/05/21 139/81  02/18/21 (!) 146/74  01/26/21 (!) 117/59   Lab Results  Component Value Date   CREATININE 0.77 06/01/2020   2. Pure hypercholesterolemia Course: At goal. Lipid-lowering medications: simvastatin 20 mg daily.   Plan: Dietary changes: Increase soluble fiber, decrease simple carbohydrates, decrease saturated fat. Exercise changes: Moderate to vigorous-intensity aerobic activity 150 minutes per week or as tolerated. We will continue to monitor along with PCP/specialists as it pertains to her weight loss journey.  Lab Results  Component Value Date   CHOL 163 06/01/2020   HDL 63 06/01/2020   LDLCALC 84 06/01/2020   TRIG 85 06/01/2020   CHOLHDL 2.6 06/01/2020   Lab Results  Component Value Date   ALT 15 06/01/2020   AST 18 06/01/2020   ALKPHOS 125 (H) 06/01/2020   BILITOT 0.8 06/01/2020   The 10-year ASCVD  risk score (Arnett DK, et al., 2019) is: 26.8%   Values used to calculate the score:     Age: 75 years     Sex: Female     Is Non-Hispanic African American: No     Diabetic: No     Tobacco smoker: No     Systolic Blood Pressure: 270 mmHg     Is BP treated: Yes     HDL Cholesterol: 63 mg/dL     Total Cholesterol: 163 mg/dL  3. Situational anxiety Behavior modification techniques were discussed today to help Margaret Reese deal with her anxiety.  Orders and follow up as documented in patient record.  4. Obesity, current BMI 33.2  Course: Margaret Reese is currently in the action stage of change. As such, her goal is to continue with weight loss efforts.   Nutrition goals: She has agreed to the Category 1 Plan.   Exercise goals:  As is.  Behavioral modification strategies: increasing lean protein intake, decreasing simple carbohydrates, increasing vegetables, and increasing water intake.  Margaret Reese has agreed to follow-up with our clinic in 4 weeks. She was informed of the importance of frequent follow-up visits to maximize her success with intensive lifestyle modifications for her multiple health conditions.   Objective:   Blood pressure 139/81, pulse 95, temperature 97.9 F (36.6 C), temperature source Oral, height 5\' 1"  (1.549 m), weight 175 lb (79.4 kg), SpO2 96 %. Body mass index is 33.07 kg/m.  General: Cooperative, alert, well developed, in no acute distress. HEENT: Conjunctivae and lids unremarkable. Cardiovascular: Regular rhythm.  Lungs: Normal work of breathing.  Neurologic: No focal deficits.   Lab Results  Component Value Date   CREATININE 0.77 06/01/2020   BUN 18 06/01/2020   NA 142 06/01/2020   K 4.0 06/01/2020   CL 102 06/01/2020   CO2 24 06/01/2020   Lab Results  Component Value Date   ALT 15 06/01/2020   AST 18 06/01/2020   ALKPHOS 125 (H) 06/01/2020   BILITOT 0.8 06/01/2020   Lab Results  Component Value Date   HGBA1C 5.6 03/26/2019   Lab Results  Component Value  Date   INSULIN 8.1 03/26/2019   Lab Results  Component Value Date   TSH 3.360 03/26/2019   Lab Results  Component Value Date   CHOL 163 06/01/2020   HDL 63 06/01/2020   LDLCALC 84 06/01/2020   TRIG 85 06/01/2020   CHOLHDL 2.6 06/01/2020   Lab Results  Component Value Date   VD25OH 31.6 03/26/2019   Lab Results  Component Value Date   WBC 5.9 06/01/2020   HGB 13.4 06/01/2020   HCT 40.6 06/01/2020   MCV 87 06/01/2020   PLT 221 06/01/2020   Lab Results  Component Value Date   IRON 64 03/26/2019   TIBC 301 03/26/2019   FERRITIN 260 (H) 03/26/2019   Attestation Statements:   Reviewed by clinician on day of visit: allergies, medications, problem list, medical history, surgical history, family history, social history, and previous encounter notes.  I, Water quality scientist, CMA, am acting as transcriptionist for [name], DO  I have reviewed the above documentation for accuracy and completeness, and I agree with the above. -  [name], DO, MS, FAAFP, DABOM - Family and Bariatric Medicine.

## 2021-04-27 DIAGNOSIS — I10 Essential (primary) hypertension: Secondary | ICD-10-CM | POA: Diagnosis not present

## 2021-05-10 ENCOUNTER — Encounter (INDEPENDENT_AMBULATORY_CARE_PROVIDER_SITE_OTHER): Payer: Self-pay | Admitting: Family Medicine

## 2021-05-10 ENCOUNTER — Other Ambulatory Visit: Payer: Self-pay

## 2021-05-10 ENCOUNTER — Ambulatory Visit (INDEPENDENT_AMBULATORY_CARE_PROVIDER_SITE_OTHER): Payer: Medicare Other | Admitting: Family Medicine

## 2021-05-10 VITALS — BP 134/66 | HR 81 | Temp 97.6°F | Ht 61.0 in | Wt 180.0 lb

## 2021-05-10 DIAGNOSIS — F3289 Other specified depressive episodes: Secondary | ICD-10-CM | POA: Diagnosis not present

## 2021-05-10 DIAGNOSIS — Z6834 Body mass index (BMI) 34.0-34.9, adult: Secondary | ICD-10-CM | POA: Diagnosis not present

## 2021-05-10 DIAGNOSIS — E669 Obesity, unspecified: Secondary | ICD-10-CM

## 2021-05-10 DIAGNOSIS — E7800 Pure hypercholesterolemia, unspecified: Secondary | ICD-10-CM

## 2021-05-10 DIAGNOSIS — I10 Essential (primary) hypertension: Secondary | ICD-10-CM | POA: Diagnosis not present

## 2021-05-11 DIAGNOSIS — Z20828 Contact with and (suspected) exposure to other viral communicable diseases: Secondary | ICD-10-CM | POA: Diagnosis not present

## 2021-05-19 NOTE — Progress Notes (Signed)
Chief Complaint:   OBESITY Margaret Reese is here to discuss her progress with her obesity treatment plan along with follow-up of her obesity related diagnoses. See Medical Weight Management Flowsheet for complete bioelectrical impedance results.  Today's visit was #: 25 Starting weight: 212 lbs Starting date: 03/26/2019 Weight change since last visit: +5 lbs Total lbs lost to date: 32 lbs Total weight loss percentage to date: -15.09%  Nutrition Plan: Category 1 Plan for 40-50% of the time.  Activity: None.  Interim History: Margaret Reese says that her grandchild is doing much better lately (autism/OCD).  She has not been exercising lately due to fatigue.  She is on RPM with Cardiology to monitor BP/HR.    She eats breakfast and lunch, but then gets busy and forgets to eat until ~8 pm and then "eats everything".  Assessment/Plan:   1. Essential hypertension At goal. Medications: Benicar 20 mg daily, diltiazem 120 mg daily.  She is on RPM.   Plan: Avoid buying foods that are: processed, frozen, or prepackaged to avoid excess salt. We will watch for signs of hypotension as she continues lifestyle modifications.  BP Readings from Last 3 Encounters:  05/10/21 134/66  04/05/21 139/81  02/18/21 (!) 146/74   Lab Results  Component Value Date   CREATININE 0.77 06/01/2020   2. Pure hypercholesterolemia Course: Controlled. Lipid-lowering medications: simvastatin 20 mg daily.   Plan: Dietary changes: Increase soluble fiber, decrease simple carbohydrates, decrease saturated fat. Exercise changes: Moderate to vigorous-intensity aerobic activity 150 minutes per week or as tolerated. We will continue to monitor along with PCP/specialists as it pertains to her weight loss journey.  Lab Results  Component Value Date   CHOL 163 06/01/2020   HDL 63 06/01/2020   LDLCALC 84 06/01/2020   TRIG 85 06/01/2020   CHOLHDL 2.6 06/01/2020   Lab Results  Component Value Date   ALT 15 06/01/2020   AST 18  06/01/2020   ALKPHOS 125 (H) 06/01/2020   BILITOT 0.8 06/01/2020   The 10-year ASCVD risk score (Arnett DK, et al., 2019) is: 34.1%   Values used to calculate the score:     Age: 76 years     Sex: Female     Is Non-Hispanic African American: No     Diabetic: No     Tobacco smoker: Yes     Systolic Blood Pressure: 494 mmHg     Is BP treated: Yes     HDL Cholesterol: 63 mg/dL     Total Cholesterol: 163 mg/dL  3. Other depression, with emotional eating Improving. Medication: None.   Plan:  Discussed cues and consequences, how thoughts affect eating, model of thoughts, feelings, and behaviors, and strategies for change by focusing on the cue. Discussed cognitive distortions, coping thoughts, and how to change your thoughts.  4. Obesity, current BMI 34  Course: Margaret Reese is currently in the action stage of change. As such, her goal is to continue with weight loss efforts.   Nutrition goals: She has agreed to the Category 1 Plan.   Exercise goals:  As is.  Behavioral modification strategies: decreasing simple carbohydrates, increasing vegetables, increasing water intake, and no skipping meals.  Margaret Reese has agreed to follow-up with our clinic in 4 weeks. She was informed of the importance of frequent follow-up visits to maximize her success with intensive lifestyle modifications for her multiple health conditions.   Objective:   Blood pressure 134/66, pulse 81, temperature 97.6 F (36.4 C), temperature source Oral, height '5\' 1"'$  (  1.549 m), weight 180 lb (81.6 kg), SpO2 95 %. Body mass index is 34.01 kg/m.  General: Cooperative, alert, well developed, in no acute distress. HEENT: Conjunctivae and lids unremarkable. Cardiovascular: Regular rhythm.  Lungs: Normal work of breathing. Neurologic: No focal deficits.   Lab Results  Component Value Date   CREATININE 0.77 06/01/2020   BUN 18 06/01/2020   NA 142 06/01/2020   K 4.0 06/01/2020   CL 102 06/01/2020   CO2 24 06/01/2020   Lab  Results  Component Value Date   ALT 15 06/01/2020   AST 18 06/01/2020   ALKPHOS 125 (H) 06/01/2020   BILITOT 0.8 06/01/2020   Lab Results  Component Value Date   HGBA1C 5.6 03/26/2019   Lab Results  Component Value Date   INSULIN 8.1 03/26/2019   Lab Results  Component Value Date   TSH 3.360 03/26/2019   Lab Results  Component Value Date   CHOL 163 06/01/2020   HDL 63 06/01/2020   LDLCALC 84 06/01/2020   TRIG 85 06/01/2020   CHOLHDL 2.6 06/01/2020   Lab Results  Component Value Date   VD25OH 31.6 03/26/2019   Lab Results  Component Value Date   WBC 5.9 06/01/2020   HGB 13.4 06/01/2020   HCT 40.6 06/01/2020   MCV 87 06/01/2020   PLT 221 06/01/2020   Lab Results  Component Value Date   IRON 64 03/26/2019   TIBC 301 03/26/2019   FERRITIN 260 (H) 03/26/2019   Attestation Statements:   Reviewed by clinician on day of visit: allergies, medications, problem list, medical history, surgical history, family history, social history, and previous encounter notes.  I, Water quality scientist, CMA, am acting as transcriptionist for [name], DO  I have reviewed the above documentation for accuracy and completeness, and I agree with the above. -  [name], DO, MS, FAAFP, DABOM - Family and Bariatric Medicine.

## 2021-05-28 DIAGNOSIS — I10 Essential (primary) hypertension: Secondary | ICD-10-CM | POA: Diagnosis not present

## 2021-06-07 DIAGNOSIS — I441 Atrioventricular block, second degree: Secondary | ICD-10-CM | POA: Diagnosis not present

## 2021-06-07 DIAGNOSIS — Z45018 Encounter for adjustment and management of other part of cardiac pacemaker: Secondary | ICD-10-CM | POA: Diagnosis not present

## 2021-06-09 DIAGNOSIS — Z20822 Contact with and (suspected) exposure to covid-19: Secondary | ICD-10-CM | POA: Diagnosis not present

## 2021-06-16 ENCOUNTER — Encounter (INDEPENDENT_AMBULATORY_CARE_PROVIDER_SITE_OTHER): Payer: Self-pay | Admitting: Adult Health

## 2021-06-16 ENCOUNTER — Ambulatory Visit (INDEPENDENT_AMBULATORY_CARE_PROVIDER_SITE_OTHER): Payer: Medicare Other | Admitting: Adult Health

## 2021-06-16 VITALS — BP 141/75 | HR 78 | Temp 98.7°F | Ht 61.0 in | Wt 179.0 lb

## 2021-06-16 DIAGNOSIS — R632 Polyphagia: Secondary | ICD-10-CM

## 2021-06-16 DIAGNOSIS — Z6838 Body mass index (BMI) 38.0-38.9, adult: Secondary | ICD-10-CM

## 2021-06-21 DIAGNOSIS — Z20822 Contact with and (suspected) exposure to covid-19: Secondary | ICD-10-CM | POA: Diagnosis not present

## 2021-06-22 ENCOUNTER — Encounter: Payer: Self-pay | Admitting: Family Medicine

## 2021-06-22 ENCOUNTER — Ambulatory Visit (INDEPENDENT_AMBULATORY_CARE_PROVIDER_SITE_OTHER): Payer: Medicare Other | Admitting: Family Medicine

## 2021-06-22 VITALS — BP 140/66 | HR 72 | Temp 97.9°F | Ht 61.0 in | Wt 185.4 lb

## 2021-06-22 DIAGNOSIS — N281 Cyst of kidney, acquired: Secondary | ICD-10-CM

## 2021-06-22 DIAGNOSIS — I5032 Chronic diastolic (congestive) heart failure: Secondary | ICD-10-CM

## 2021-06-22 DIAGNOSIS — I10 Essential (primary) hypertension: Secondary | ICD-10-CM

## 2021-06-22 DIAGNOSIS — Z950 Presence of cardiac pacemaker: Secondary | ICD-10-CM | POA: Diagnosis not present

## 2021-06-22 DIAGNOSIS — N200 Calculus of kidney: Secondary | ICD-10-CM | POA: Diagnosis not present

## 2021-06-22 DIAGNOSIS — J4520 Mild intermittent asthma, uncomplicated: Secondary | ICD-10-CM | POA: Diagnosis not present

## 2021-06-22 DIAGNOSIS — J301 Allergic rhinitis due to pollen: Secondary | ICD-10-CM

## 2021-06-22 DIAGNOSIS — E785 Hyperlipidemia, unspecified: Secondary | ICD-10-CM

## 2021-06-22 DIAGNOSIS — E669 Obesity, unspecified: Secondary | ICD-10-CM | POA: Diagnosis not present

## 2021-06-22 MED ORDER — SIMVASTATIN 20 MG PO TABS: 20.0000 mg | ORAL_TABLET | Freq: Every evening | ORAL | 3 refills | Status: DC

## 2021-06-22 NOTE — Progress Notes (Signed)
Margaret Reese is a 76 y.o. female who presents for annual wellness visit and follow-up on chronic medical conditions.  She has a pacemaker and does see cardiology regularly.  She continues on Benicar as well as Cardizem.  She does exercise regularly.  She does go to medical weight loss and wellness and has lost 30 pounds since being involved with them and has had a total of 120 pounds lost over the last several years.  She also has a history of renal stones and a cyst and is followed regularly by urology.  She does have underlying allergies and a previous history of asthma but no difficulty with that recently. ? ?Immunizations and Health Maintenance ?Immunization History  ?Administered Date(s) Administered  ? Influenza Split 11/07/2011, 12/01/2012  ? Influenza Whole 01/11/2006, 11/28/2008, 11/30/2010  ? Influenza-Unspecified 12/08/2014, 12/07/2015, 11/21/2016, 11/21/2017, 11/16/2018, 11/03/2020  ? PFIZER(Purple Top)SARS-COV-2 Vaccination 04/07/2019, 04/30/2019, 10/24/2019  ? Pension scheme manager 73yr & up 12/25/2020  ? Pneumococcal Conjugate-13 09/15/2015  ? Pneumococcal Polysaccharide-23 09/07/2004, 09/11/2012  ? Td 09/17/2004  ? Tdap 06/01/2015  ? Zoster, Live 12/16/2009  ? ?There are no preventive care reminders to display for this patient. ? ?Last Pap smear: aged out  ?Last mammogram: aged out 01/05/21 ?Last colonoscopy: aged out  10/14/14 Dr. HBenson [place]?Last DEXA: 03/18/14 normal ?Dentist: Q six months   ?Ophtho: Q two years ?Exercise: six days a week at home and a class ? ?Other doctors caring for patient include: Dr. HBenson [place]GI ?           Dr. DRosana Ho[facility]rology ?          [facility]  ? ?Advanced directives: ?Does Patient Have a Medical Advance Directive?: Yes ?Type of Advance Directive: Healthcare Power of [place] [facility] will ?Does patient want to make changes to medical advance directive?: No - Patient declined ?Copy of [place] Chart?: Yes - validated most recent  copy scanned in chart (See row information) ? ?Depression screen:  See questionnaire below.  ? ?  06/22/2021  ?  8:26 AM 06/22/2021  ?  8:22 AM 06/01/2020  ?  2:34 PM 08/21/2019  ? 10:59 AM 03/26/2019  ?  8:12 AM  ?Depression screen PHQ 2/9  ?Decreased Interest 0 0 0 0 1  ?Down, Depressed, Hopeless 0 0 0 1 1  ?PHQ - 2 Score 0 0 0 1 2  ?Altered sleeping 0    3  ?Tired, decreased energy 1    3  ?Change in appetite 3    3  ?Feeling bad or failure about yourself  0    1  ?Trouble concentrating 0    0  ?Moving slowly or fidgety/restless 0    1  ?Suicidal thoughts 0    0  ?PHQ-9 Score 4    13  ?Difficult doing work/chores Not difficult at all    Not difficult at all  ? ? ?Fall Risk Screen: see questionnaire below. ? ?  06/22/2021  ?  8:21 AM 06/01/2020  ?  2:33 PM 08/21/2019  ? 10:59 AM 11/20/2018  ?  8:17 AM 11/09/2017  ?  8:44 AM  ?Fall Risk   ?Falls in the past year? 0 0 0 0 No  ?Number falls in past yr: 0 0     ?Injury with Fall? 0 0     ?Risk for fall due to : No Fall Risks No Fall Risks     ?Follow up Falls evaluation completed Falls evaluation  completed     ? ? ?ADL screen:  See questionnaire below ?Functional Status Survey: ?Is the patient deaf or have difficulty hearing?: No ?Does the patient have difficulty seeing, even when wearing glasses/contacts?: No ?Does the patient have difficulty concentrating, remembering, or making decisions?: No ?Does the patient have difficulty walking or climbing stairs?: Yes ?Does the patient have difficulty dressing or bathing?: No ?Does the patient have difficulty doing errands alone such as visiting a doctor's office or shopping?: No ? ? ?Review of Systems ?Constitutional: -, -unexpected weight change, -anorexia, -fatigue ?Allergy: -sneezing, -itching, -congestion ?Dermatology: denies changing moles, rash, lumps ?ENT: -runny nose, -ear pain, -sore throat,  ?Cardiology:  -chest pain, -palpitations, -orthopnea, ?Respiratory: -cough, -shortness of breath, -dyspnea on exertion, -wheezing,   ?Gastroenterology: -abdominal pain, -nausea, -vomiting, -diarrhea, -constipation, -dysphagia ?Hematology: -bleeding or bruising problems ?Musculoskeletal: -arthralgias, -myalgias, -joint swelling, -back pain, - ?Ophthalmology: -vision changes,  ?Urology: -dysuria, -difficulty urinating,  -urinary frequency, -urgency, incontinence ?Neurology: -, -numbness, , -memory loss, -falls, -dizziness ? ? ? ?PHYSICAL EXAM: ? ?General Appearance: Alert, cooperative, no distress, appears stated age ?Head: Normocephalic, without obvious abnormality, atraumatic ?Eyes: PERRL, conjunctiva/corneas clear, EOM's intact, fundi benign ?Ears: Normal TM's and external ear canals ?Nose: Nares normal, mucosa normal, no drainage or sinus tenderness ?Throat: Lips, mucosa, and tongue normal; teeth and gums normal ?Neck: Supple, no lymphadenopathy;  thyroid:  no enlargement/tenderness/nodules; no carotid bruit or JVD ?Lungs: Clear to auscultation bilaterally without wheezes, rales or ronchi; respirations unlabored ?Heart: Regular rate and rhythm, S1 and S2 normal, no murmur, rubor gallop ?Abdomen: Soft, non-tender, nondistended, normoactive bowel sounds,  ?no masses, no hepatosplenomegaly ?Extremities: No clubbing, cyanosis or edema ?Pulses: 2+ and symmetric all extremities ?Skin:  Skin color, texture, turgor normal, no rashes or lesions ?Lymph nodes: Cervical, supraclavicular, and axillary nodes normal ?Neurologic:  CNII-XII intact, normal strength, sensation and gait; reflexes 2+ and symmetric throughout ?Psych: Normal mood, affect, hygiene and grooming. ? ?ASSESSMENT/PLAN: ?Obesity (BMI 30-39.9) - Plan: CBC with Differential/Platelet, Comprehensive metabolic panel ? ?Hyperlipidemia, unspecified hyperlipidemia type - Plan: Lipid panel, simvastatin (ZOCOR) 20 MG tablet ? ?Primary hypertension - Plan: CBC with Differential/Platelet, Comprehensive metabolic panel ? ?Calcium oxalate renal stones ? ?Renal cyst ? ?Pacemaker S/P Lee MRI model [id number] ? ?Chronic diastolic (congestive) heart failure (HCC) ? ?Seasonal allergic rhinitis due to pollen ? ?Mild intermittent asthma without complication ? ? ? ?Discussed monthly self breast exams and yearly mammograms; at least 30 minutes of aerobic activity at least 5 days/week and weight-bearing exercise 2x/week;  healthy diet, including goals of calcium and vitamin D intake  Immunization recommendations discussed.  Recommended Shingrix.  Congratulated her on taking good care of herself in terms of her physical activity and continued weight loss. ? ?Medicare Attestation ?I have personally reviewed: ?The patient's medical and social history ?Their use of alcohol, tobacco or illicit drugs ?Their current medications and supplements ?The patient's functional ability including ADLs,fall risks, home safety risks, cognitive, and hearing and visual impairment ?Diet and physical activities ?Evidence for depression or mood disorders ? ?The patient's weight, height, and BMI have been recorded in the chart.  I have made referrals, counseling, and provided education to the patient based on review of the above and I have provided the patient with a written personalized care plan for preventive services.   ? ? ?Jill Alexanders, MD   06/22/2021   ?

## 2021-06-23 LAB — CBC WITH DIFFERENTIAL/PLATELET
Basophils Absolute: 0 10*3/uL (ref 0.0–0.2)
Basos: 1 %
EOS (ABSOLUTE): 0.2 10*3/uL (ref 0.0–0.4)
Eos: 4 %
Hematocrit: 40.7 % (ref 34.0–46.6)
Hemoglobin: 13.4 g/dL (ref 11.1–15.9)
Immature Grans (Abs): 0 10*3/uL (ref 0.0–0.1)
Immature Granulocytes: 0 %
Lymphocytes Absolute: 1.9 10*3/uL (ref 0.7–3.1)
Lymphs: 38 %
MCH: 28.8 pg (ref 26.6–33.0)
MCHC: 32.9 g/dL (ref 31.5–35.7)
MCV: 88 fL (ref 79–97)
Monocytes Absolute: 0.3 10*3/uL (ref 0.1–0.9)
Monocytes: 6 %
Neutrophils Absolute: 2.5 10*3/uL (ref 1.4–7.0)
Neutrophils: 51 %
Platelets: 206 10*3/uL (ref 150–450)
RBC: 4.65 x10E6/uL (ref 3.77–5.28)
RDW: 13.1 % (ref 11.7–15.4)
WBC: 4.9 10*3/uL (ref 3.4–10.8)

## 2021-06-23 LAB — COMPREHENSIVE METABOLIC PANEL
ALT: 11 IU/L (ref 0–32)
AST: 20 IU/L (ref 0–40)
Albumin/Globulin Ratio: 2.1 (ref 1.2–2.2)
Albumin: 4.8 g/dL — ABNORMAL HIGH (ref 3.7–4.7)
Alkaline Phosphatase: 113 IU/L (ref 44–121)
BUN/Creatinine Ratio: 26 (ref 12–28)
BUN: 20 mg/dL (ref 8–27)
Bilirubin Total: 0.8 mg/dL (ref 0.0–1.2)
CO2: 27 mmol/L (ref 20–29)
Calcium: 10.4 mg/dL — ABNORMAL HIGH (ref 8.7–10.3)
Chloride: 101 mmol/L (ref 96–106)
Creatinine, Ser: 0.77 mg/dL (ref 0.57–1.00)
Globulin, Total: 2.3 g/dL (ref 1.5–4.5)
Glucose: 92 mg/dL (ref 70–99)
Potassium: 4.2 mmol/L (ref 3.5–5.2)
Sodium: 140 mmol/L (ref 134–144)
Total Protein: 7.1 g/dL (ref 6.0–8.5)
eGFR: 79 mL/min/{1.73_m2} (ref >59)

## 2021-06-23 LAB — LIPID PANEL
Chol/HDL Ratio: 2.2 ratio (ref 0.0–4.4)
Cholesterol, Total: 147 mg/dL (ref 100–199)
HDL: 67 mg/dL (ref >39)
LDL Chol Calc (NIH): 65 mg/dL (ref 0–99)
Triglycerides: 77 mg/dL (ref 0–149)
VLDL Cholesterol Cal: 15 mg/dL (ref 5–40)

## 2021-06-27 DIAGNOSIS — I10 Essential (primary) hypertension: Secondary | ICD-10-CM | POA: Diagnosis not present

## 2021-06-29 NOTE — Progress Notes (Signed)
? ? ? ?Chief Complaint:  ? ?OBESITY ?Margaret Reese is here to discuss her progress with her obesity treatment plan along with follow-up of her obesity related diagnoses. Margaret Reese is on the Category 1 Plan / A and states she is following her eating plan approximately 40% of the time. Margaret Reese states she is doing low impact and stretching 30-40 minutes 5-6 times per week. ? ?Today's visit was #: 26 ?Starting weight: 212 ?Starting date: 03/26/2019 ?Today's weight: 179 ?Today's date: 06/16/2021 ?Total lbs lost to date: 42 ?Total lbs lost since last in-office visit: 1 ? ?Interim History:  ?Margaret Reese is being challenged to limit her sugars.   ?She states she can consume an entire bag of cookies and that she is not enjoying many vegetables.   ?Margaret Reese has been married for 21 years and has two sons, ages: 54 and 69. ? ?Subjective:  ? ?1. Polyphagia ?Discussed risks and benefits of Metformin therapy.  Margaret Reese woul like to "think about it". ?06/01/20 CMP GFR-80 ? ?Assessment/Plan:  ? ?1. Polyphagia ?Consider Metformin initiation.  Handout on Metformin information sheet given. ?Continue to increase protein and limit sugar/CHO intake. ? ?2. Obesity, current BMI 33.9 ? ?Margaret Reese is currently in the action stage of change. As such, her goal is to continue with weight loss efforts. She has agreed to the Category 1 Plan.  ? ?Exercise goals: As is ? ?Behavioral modification strategies: increasing lean protein intake, decreasing simple carbohydrates, meal planning and cooking strategies, keeping healthy foods in the home, and planning for success. ? ?Margaret Reese has agreed to follow-up with our clinic in 4 weeks. She was informed of the importance of frequent follow-up visits to maximize her success with intensive lifestyle modifications for her multiple health conditions.  ? ? ?Objective:  ? ?Blood pressure (!) 141/75, pulse 78, temperature 98.7 ?F (37.1 ?C), height '5\' 1"'$  (1.549 m), weight 179 lb (81.2 kg), SpO2 97 %. ?Body mass index is 33.82 kg/m?. ? ?General:  Cooperative, alert, well developed, in no acute distress. ?HEENT: Conjunctivae and lids unremarkable. ?Cardiovascular: Regular rhythm.  ?Lungs: Normal work of breathing. ?Neurologic: No focal deficits.  ? ?Lab Results  ?Component Value Date  ? CREATININE 0.77 06/22/2021  ? BUN 20 06/22/2021  ? NA 140 06/22/2021  ? K 4.2 06/22/2021  ? CL 101 06/22/2021  ? CO2 27 06/22/2021  ? ?Lab Results  ?Component Value Date  ? ALT 11 06/22/2021  ? AST 20 06/22/2021  ? ALKPHOS 113 06/22/2021  ? BILITOT 0.8 06/22/2021  ? ?Lab Results  ?Component Value Date  ? HGBA1C 5.6 03/26/2019  ? ?Lab Results  ?Component Value Date  ? INSULIN 8.1 03/26/2019  ? ?Lab Results  ?Component Value Date  ? TSH 3.360 03/26/2019  ? ?Lab Results  ?Component Value Date  ? CHOL 147 06/22/2021  ? HDL 67 06/22/2021  ? Why 65 06/22/2021  ? TRIG 77 06/22/2021  ? CHOLHDL 2.2 06/22/2021  ? ?Lab Results  ?Component Value Date  ? VD25OH 31.6 03/26/2019  ? ?Lab Results  ?Component Value Date  ? WBC 4.9 06/22/2021  ? HGB 13.4 06/22/2021  ? HCT 40.7 06/22/2021  ? MCV 88 06/22/2021  ? PLT 206 06/22/2021  ? ?Lab Results  ?Component Value Date  ? IRON 64 03/26/2019  ? TIBC 301 03/26/2019  ? FERRITIN 260 (H) 03/26/2019  ? ? ?Obesity Behavioral Intervention:  ? ?Approximately 15 minutes were spent on the discussion below. ? ?ASK: ?We discussed the diagnosis of obesity with Siddalee today and Laporchia agreed  to give [name] permission to discuss obesity behavioral modification therapy today. ? ?ASSESS: ?Jonasia has the diagnosis of obesity and her BMI today is 33.82. Hadlea is in the action stage of change.  ? ?ADVISE: ?Dhani was educated on the multiple health risks of obesity as well as the benefit of weight loss to improve her health. She was advised of the need for long term treatment and the importance of lifestyle modifications to improve her current health and to decrease her risk of future health problems. ? ?AGREE: ?Multiple dietary modification options and treatment options  were discussed and Arohi agreed to follow the recommendations documented in the above note. ? ?ARRANGE: ?Ilana was educated on the importance of frequent visits to treat obesity as outlined per CMS and USPSTF guidelines and agreed to schedule her next follow up appointment today. ? ?Attestation Statements:  ? ?Reviewed by clinician on day of visit: allergies, medications, problem list, medical history, surgical history, family history, social history, and previous encounter notes. ? ?Time spent on visit including pre-visit chart review and post-visit care and charting was 28 minutes.  ? ?I, Althea Charon, am acting as Location manager for [name], NP. ? ?I have reviewed the above documentation for accuracy and completeness, and I agree with the above. -  Mayla Biddy d. Lynnel Zanetti, NP-C ? ? ?

## 2021-06-30 DIAGNOSIS — Z961 Presence of intraocular lens: Secondary | ICD-10-CM | POA: Diagnosis not present

## 2021-06-30 DIAGNOSIS — H348112 Central retinal vein occlusion, right eye, stable: Secondary | ICD-10-CM | POA: Diagnosis not present

## 2021-06-30 DIAGNOSIS — D3131 Benign neoplasm of right choroid: Secondary | ICD-10-CM | POA: Diagnosis not present

## 2021-06-30 DIAGNOSIS — H10413 Chronic giant papillary conjunctivitis, bilateral: Secondary | ICD-10-CM | POA: Diagnosis not present

## 2021-06-30 DIAGNOSIS — H04123 Dry eye syndrome of bilateral lacrimal glands: Secondary | ICD-10-CM | POA: Diagnosis not present

## 2021-06-30 DIAGNOSIS — H353121 Nonexudative age-related macular degeneration, left eye, early dry stage: Secondary | ICD-10-CM | POA: Diagnosis not present

## 2021-06-30 DIAGNOSIS — H31091 Other chorioretinal scars, right eye: Secondary | ICD-10-CM | POA: Diagnosis not present

## 2021-07-03 ENCOUNTER — Other Ambulatory Visit: Payer: Self-pay | Admitting: Cardiology

## 2021-07-03 DIAGNOSIS — I10 Essential (primary) hypertension: Secondary | ICD-10-CM

## 2021-07-15 ENCOUNTER — Other Ambulatory Visit (INDEPENDENT_AMBULATORY_CARE_PROVIDER_SITE_OTHER): Payer: Self-pay | Admitting: Adult Health

## 2021-07-15 ENCOUNTER — Ambulatory Visit (INDEPENDENT_AMBULATORY_CARE_PROVIDER_SITE_OTHER): Payer: Medicare Other | Admitting: Adult Health

## 2021-07-15 ENCOUNTER — Encounter (INDEPENDENT_AMBULATORY_CARE_PROVIDER_SITE_OTHER): Payer: Self-pay | Admitting: Adult Health

## 2021-07-15 VITALS — BP 156/79 | HR 87 | Temp 98.1°F | Ht 61.0 in | Wt 181.0 lb

## 2021-07-15 DIAGNOSIS — Z6834 Body mass index (BMI) 34.0-34.9, adult: Secondary | ICD-10-CM | POA: Diagnosis not present

## 2021-07-15 DIAGNOSIS — R632 Polyphagia: Secondary | ICD-10-CM | POA: Diagnosis not present

## 2021-07-15 DIAGNOSIS — E669 Obesity, unspecified: Secondary | ICD-10-CM | POA: Diagnosis not present

## 2021-07-15 DIAGNOSIS — I10 Essential (primary) hypertension: Secondary | ICD-10-CM | POA: Diagnosis not present

## 2021-07-15 DIAGNOSIS — E66813 Obesity, class 3: Secondary | ICD-10-CM

## 2021-07-15 MED ORDER — METFORMIN HCL 500 MG PO TABS
ORAL_TABLET | ORAL | 0 refills | Status: DC
Start: 2021-07-15 — End: 2021-08-05

## 2021-07-21 NOTE — Progress Notes (Signed)
Chief Complaint:   OBESITY Morayma is here to discuss her progress with her obesity treatment plan along with follow-up of her obesity related diagnoses. Jessalynn is on the Category 1 Plan and states she is following her eating plan approximately 30% of the time. Margree states she is doing low impact exercise 30 to 40 minutes 5-6 times per week.  Today's visit was #: 48 Starting weight: 212 Starting date: 03/26/2019 Today's weight: 181 lbs Today's date: 07/15/21 Total lbs lost to date: 31 Total lbs lost since last in-office visit: +2  Interim History:  Her granddaughter (19) who lives with her father/mother recently experienced and acute mental health crisis- She reported hallucinations and suicidal ideation. She required police escort to behavioral health [facility] Emergency Department. She was evaluated at [facility] and released stable home.   Her granddaughter has been diagnosed with autism, OCD.    Ms. Capito endorses increase in stress and it has been difficult to eat on plan since this family crisis.  Subjective:   1. Essential hypertension Systolic blood pressure elevated at office visit.   She endorses increased stress due to granddaughter's worsening mental health. Home readings systolic blood pressure [id number] blood pressure [id number] are electronically transmitted to cardiology.  2. Polyphagia 06/22/2021 CMP GFR 79, blood glucose 92.  03/26/2019 A1c 5.6. Endorses sugar cravings.   Again discussed risk/benefits of metformin therapy.  Assessment/Plan:   1. Essential hypertension Continue current antihypertensive therapy.  2. Polyphagia Start: - metFORMIN (GLUCOPHAGE) 500 MG tablet; 1/2 tab with dinner for one week, then increase to full tab with dinner second week- hold at this dose.  Dispense: 30 tablet; Refill: 0  3. Obesity: Current BMI 34.2 Kyanna is currently in the action stage of change. As such, her goal is to continue with weight loss efforts. She has  agreed to the Category 1 Plan.   Exercise goals: as is.  Behavioral modification strategies: increasing lean protein intake, decreasing simple carbohydrates, meal planning and cooking strategies, keeping healthy foods in the home, and planning for success.  Caydee has agreed to follow-up with our clinic in 4 weeks. She was informed of the importance of frequent follow-up visits to maximize her success with intensive lifestyle modifications for her multiple health conditions.   Objective:   Blood pressure (!) 156/79, pulse 87, temperature 98.1 F (36.7 C), height '5\' 1"'$  (1.549 m), weight 181 lb (82.1 kg), SpO2 96 %. Body mass index is 34.2 kg/m.  General: Cooperative, alert, well developed, in no acute distress. HEENT: Conjunctivae and lids unremarkable. Cardiovascular: Regular rhythm.  Lungs: Normal work of breathing. Neurologic: No focal deficits.   Lab Results  Component Value Date   CREATININE 0.77 06/22/2021   BUN 20 06/22/2021   NA 140 06/22/2021   K 4.2 06/22/2021   CL 101 06/22/2021   CO2 27 06/22/2021   Lab Results  Component Value Date   ALT 11 06/22/2021   AST 20 06/22/2021   ALKPHOS 113 06/22/2021   BILITOT 0.8 06/22/2021   Lab Results  Component Value Date   HGBA1C 5.6 03/26/2019   Lab Results  Component Value Date   INSULIN 8.1 03/26/2019   Lab Results  Component Value Date   TSH 3.360 03/26/2019   Lab Results  Component Value Date   CHOL 147 06/22/2021   HDL 67 06/22/2021   LDLCALC 65 06/22/2021   TRIG 77 06/22/2021   CHOLHDL 2.2 06/22/2021   Lab Results  Component Value Date   VD25OH 31.6  03/26/2019   Lab Results  Component Value Date   WBC 4.9 06/22/2021   HGB 13.4 06/22/2021   HCT 40.7 06/22/2021   MCV 88 06/22/2021   PLT 206 06/22/2021   Lab Results  Component Value Date   IRON 64 03/26/2019   TIBC 301 03/26/2019   FERRITIN 260 (H) 03/26/2019    Obesity Behavioral Intervention:   Approximately 15 minutes were spent on the  discussion below.  ASK: We discussed the diagnosis of obesity with Maesyn today and Stefanee agreed to give [name] permission to discuss obesity behavioral modification therapy today.  ASSESS: Avarie has the diagnosis of obesity and her BMI today is 34.Shauna Hugh is in the action stage of change.   ADVISE: Matha was educated on the multiple health risks of obesity as well as the benefit of weight loss to improve her health. She was advised of the need for long term treatment and the importance of lifestyle modifications to improve her current health and to decrease her risk of future health problems.  AGREE: Multiple dietary modification options and treatment options were discussed and Niaja agreed to follow the recommendations documented in the above note.  ARRANGE: Nora was educated on the importance of frequent visits to treat obesity as outlined per CMS and USPSTF guidelines and agreed to schedule her next follow up appointment today.  Attestation Statements:   Reviewed by clinician on day of visit: allergies, medications, problem list, medical history, surgical history, family history, social history, and previous encounter notes.  I, Georgianne Fick, FNP, am acting as Location manager for [name], NP.  I have reviewed the above documentation for accuracy and completeness, and I agree with the above. -  Travares Nelles d. Draycen Leichter, NP-C

## 2021-07-24 ENCOUNTER — Other Ambulatory Visit: Payer: Self-pay | Admitting: Cardiology

## 2021-07-24 DIAGNOSIS — R002 Palpitations: Secondary | ICD-10-CM

## 2021-07-25 DIAGNOSIS — R632 Polyphagia: Secondary | ICD-10-CM | POA: Insufficient documentation

## 2021-07-25 DIAGNOSIS — Z6841 Body Mass Index (BMI) 40.0 and over, adult: Secondary | ICD-10-CM | POA: Insufficient documentation

## 2021-07-28 DIAGNOSIS — I10 Essential (primary) hypertension: Secondary | ICD-10-CM | POA: Diagnosis not present

## 2021-08-04 ENCOUNTER — Encounter: Payer: Medicare Other | Admitting: Cardiology

## 2021-08-05 ENCOUNTER — Encounter (INDEPENDENT_AMBULATORY_CARE_PROVIDER_SITE_OTHER): Payer: Self-pay | Admitting: Adult Health

## 2021-08-05 ENCOUNTER — Ambulatory Visit (INDEPENDENT_AMBULATORY_CARE_PROVIDER_SITE_OTHER): Payer: Medicare Other | Admitting: Adult Health

## 2021-08-05 VITALS — BP 123/67 | HR 89 | Temp 98.1°F | Ht 61.0 in | Wt 178.0 lb

## 2021-08-05 DIAGNOSIS — R0602 Shortness of breath: Secondary | ICD-10-CM | POA: Insufficient documentation

## 2021-08-05 DIAGNOSIS — Z7984 Long term (current) use of oral hypoglycemic drugs: Secondary | ICD-10-CM

## 2021-08-05 DIAGNOSIS — R632 Polyphagia: Secondary | ICD-10-CM | POA: Diagnosis not present

## 2021-08-05 DIAGNOSIS — E669 Obesity, unspecified: Secondary | ICD-10-CM

## 2021-08-05 DIAGNOSIS — Z6833 Body mass index (BMI) 33.0-33.9, adult: Secondary | ICD-10-CM | POA: Diagnosis not present

## 2021-08-05 DIAGNOSIS — R0609 Other forms of dyspnea: Secondary | ICD-10-CM

## 2021-08-05 MED ORDER — METFORMIN HCL 500 MG PO TABS: ORAL_TABLET | ORAL | 0 refills | Status: DC

## 2021-08-09 ENCOUNTER — Other Ambulatory Visit (INDEPENDENT_AMBULATORY_CARE_PROVIDER_SITE_OTHER): Payer: Self-pay | Admitting: Adult Health

## 2021-08-09 DIAGNOSIS — R632 Polyphagia: Secondary | ICD-10-CM

## 2021-08-10 NOTE — Progress Notes (Signed)
Chief Complaint:   OBESITY Margaret Reese is here to discuss her progress with her obesity treatment plan along with follow-up of her obesity related diagnoses. Dewanda is on the Category 1 Plan and states she is following her eating plan approximately 40-50% of the time. Akyah states she is doing chair aerobics 40-45 minutes 5-6 times per week.  Today's visit was #: 28 Starting weight: 212 lbs Starting date: 03/26/2019 Today's weight: 178 lbs Today's date: 08/05/2021 Total lbs lost to date: 34 lbs Total lbs lost since last in-office visit: 3  Interim History:  Lucianna has had dyspnea and fatigue for the last week, she denies chest pain and palpitations.  She denies cough.  Her Cardiology visit on 08/04/21 cancelled, new appointment 08/12/21.  Subjective:   1. Dyspnea on exertion Samyria endorses increase dyspnea with exertion and increased fatigue since 08/02/2021.  Baseline O2 sat 95%- when seated. She was walked around building and her O2 sat decreased to 88%. She also needed to stop and rest while walking with staff member.  She had a Cards appointment yesterday that was cancelled.  New Cards OV on 08/12/21.  2. Polyphagia Selita started Metformin on 07/15/21, she has titrated up to one full tablet.  She endorses an decrease in sugar cravings.   She denies GI upset.  Assessment/Plan:   1. Dyspnea on exertion Richanda has a new cardio appointment on 08/12/21 [facility]).  Discussed red flag symptoms. If any develops seek immediate medical assistance.  Tenika verbalized understanding/agreement.   2. Polyphagia We will refill Metformin 500 mg at dinner time for 1 month with 0 refills.  -Refill metFORMIN (GLUCOPHAGE) 500 MG tablet; 1 tab with dinner.  Dispense: 30 tablet; Refill: 0  3. Obesity,current BMI 33.8 Emali is currently in the action stage of change. As such, her goal is to continue with weight loss efforts. She has agreed to the Category 1 Plan.   Exercise goals:  As is.  Behavioral modification strategies: increasing lean protein intake, decreasing simple carbohydrates, meal planning and cooking strategies, keeping healthy foods in the home, and planning for success.  Zaylee has agreed to follow-up with our clinic in 4 weeks. She was informed of the importance of frequent follow-up visits to maximize her success with intensive lifestyle modifications for her multiple health conditions.   Objective:   Blood pressure 123/67, pulse 89, temperature 98.1 F (36.7 C), height '5\' 1"'$  (1.549 m), weight 178 lb (80.7 kg), SpO2 95 %. Body mass index is 33.63 kg/m.  02 sat when walking decreased to 88%  General: Cooperative, alert, well developed, in no acute distress. HEENT: Conjunctivae and lids unremarkable. Cardiovascular: Regular rhythm.  Lungs: Normal work of breathing at rest, dyspnea noted when walking. Neurologic: No focal deficits.   Lab Results  Component Value Date   CREATININE 0.77 06/22/2021   BUN 20 06/22/2021   NA 140 06/22/2021   K 4.2 06/22/2021   CL 101 06/22/2021   CO2 27 06/22/2021   Lab Results  Component Value Date   ALT 11 06/22/2021   AST 20 06/22/2021   ALKPHOS 113 06/22/2021   BILITOT 0.8 06/22/2021   Lab Results  Component Value Date   HGBA1C 5.6 03/26/2019   Lab Results  Component Value Date   INSULIN 8.1 03/26/2019   Lab Results  Component Value Date   TSH 3.360 03/26/2019   Lab Results  Component Value Date   CHOL 147 06/22/2021   HDL 67 06/22/2021   LDLCALC 65  06/22/2021   TRIG 77 06/22/2021   CHOLHDL 2.2 06/22/2021   Lab Results  Component Value Date   VD25OH 31.6 03/26/2019   Lab Results  Component Value Date   WBC 4.9 06/22/2021   HGB 13.4 06/22/2021   HCT 40.7 06/22/2021   MCV 88 06/22/2021   PLT 206 06/22/2021   Lab Results  Component Value Date   IRON 64 03/26/2019   TIBC 301 03/26/2019   FERRITIN 260 (H) 03/26/2019    Obesity Behavioral Intervention:   Approximately 15  minutes were spent on the discussion below.  ASK: We discussed the diagnosis of obesity with Jamae today and Kateri agreed to give [name] permission to discuss obesity behavioral modification therapy today.  ASSESS: Cristian has the diagnosis of obesity and her BMI today is 33.8. Jaid is in the action stage of change.   ADVISE: Mesha was educated on the multiple health risks of obesity as well as the benefit of weight loss to improve her health. She was advised of the need for long term treatment and the importance of lifestyle modifications to improve her current health and to decrease her risk of future health problems.  AGREE: Multiple dietary modification options and treatment options were discussed and Shakirra agreed to follow the recommendations documented in the above note.  ARRANGE: Chyenne was educated on the importance of frequent visits to treat obesity as outlined per CMS and USPSTF guidelines and agreed to schedule her next follow up appointment today.  Attestation Statements:   Reviewed by clinician on day of visit: allergies, medications, problem list, medical history, surgical history, family history, social history, and previous encounter notes.  I, Brendell Tyus, RMA, am acting as transcriptionist for [name], NP.  I have reviewed the above documentation for accuracy and completeness, and I agree with the above. -  Adina Puzzo d. Hadli Vandemark, NP-C

## 2021-08-12 ENCOUNTER — Encounter: Payer: Self-pay | Admitting: Cardiology

## 2021-08-12 ENCOUNTER — Ambulatory Visit: Payer: Medicare Other | Admitting: Cardiology

## 2021-08-12 VITALS — BP 146/84 | HR 95 | Temp 97.3°F | Resp 16 | Ht 61.0 in | Wt 182.6 lb

## 2021-08-12 DIAGNOSIS — E782 Mixed hyperlipidemia: Secondary | ICD-10-CM | POA: Diagnosis not present

## 2021-08-12 DIAGNOSIS — Z45018 Encounter for adjustment and management of other part of cardiac pacemaker: Secondary | ICD-10-CM | POA: Diagnosis not present

## 2021-08-12 DIAGNOSIS — I10 Essential (primary) hypertension: Secondary | ICD-10-CM

## 2021-08-12 DIAGNOSIS — Z950 Presence of cardiac pacemaker: Secondary | ICD-10-CM | POA: Diagnosis not present

## 2021-08-12 DIAGNOSIS — E6609 Other obesity due to excess calories: Secondary | ICD-10-CM

## 2021-08-12 DIAGNOSIS — Z6834 Body mass index (BMI) 34.0-34.9, adult: Secondary | ICD-10-CM | POA: Diagnosis not present

## 2021-08-12 DIAGNOSIS — I441 Atrioventricular block, second degree: Secondary | ICD-10-CM

## 2021-08-12 DIAGNOSIS — R0602 Shortness of breath: Secondary | ICD-10-CM

## 2021-08-12 NOTE — Progress Notes (Signed)
Remote dual-chamber pacemaker transmission 06/07/2021: AP 22%, VP 99%.  Longevity 4 years and 6 months.  Lead impedance and thresholds within normal limits.  There were brief AT/AF episodes.  AT/AF burden <0.1%.  Normal pacemaker function.   Scheduled  In office pacemaker check 08/12/2021:  Single (S)/Dual (D)/BV: D. Presenting ASVP. Pacemaker dependant:  Yes. Underlying Complete heart block. AP 37%, VP 100%.  AMS Episodes 0.   HVR 0.  Longevity 4.2 Years. Magnet rate: >85%. Lead measurements: Stable. Histogram: Low (L)/normal (N)/high (H)  Normal. Patient activity Good.   Observations: Normal pacemaker function. Changes: Turn off VIP (patient is pacemaker dependant).   Adrian Prows, MD, [facility] 08/12/2021, 10:27 AM Office: [phone] Fax: [phone] Pager: [phone]

## 2021-08-12 NOTE — Progress Notes (Signed)
Margaret Reese Date of Birth: [birth year] MRN: [id number] Primary Care Provider:Lalonde, Elyse Jarvis, MD Former Cardiology Providers: Dr. Doylene Canard, Dr. Terrence Dupont, Dr. Einar Gip, and Jeri Lager, APRN, FNP-C. Primary Cardiologist: Rex Kras, DO, [facility] (established care 07/25/2019) Electrophysiologist: Dr. Allegra Lai  Date: 08/12/21 Last Office Visit: 07/31/2020  Chief Complaint  Patient presents with   Follow-up    1 year follow-up. Pacemaker check    HPI  Margaret Reese is a 76 y.o.  female whose past medical history and cardiovascular risk factors include: Hypertension, secondary AV block status post pacemaker, hyperlipidemia, postmenopausal female, advanced age, obesity.  Patient is being followed by the practice given her underlying pacemaker due to second-degree AV block and hypertension management.  She presents for 1 year follow-up visit for pacemaker check in the device is functioning appropriately.    However, patient has been endorsing shortness of breath for the last several months but not getting progressively worse.  She continues to go to healthy weight and wellness for regular exercise.  1 day she was experiencing shortness of breath and walked with pulse oximetry.  She was noted to be 96% at baseline and based on records received she was 88% while walking.  She has not discussed this with PCP.  She does not see pulmonary medicine.  She does have history of bronchitis and exercise-induced asthma.  She denies expiratory wheezing, cough, no sick contacts.  She also denies orthopnea, paroxysmal nocturnal dyspnea or lower extremity swelling.  Office blood pressures are not well controlled.  But home blood pressures are better with SBP ranging between 110-120 mmHg.  She had labs on Jun 22, 2021 which were independently reviewed and noted below for further reference.  FUNCTIONAL STATUS: She does floor exercise for 36mnutes a day. She is enrolled in wellness program and has lost  40 pounds since January 2021.    ALLERGIES: Allergies  Allergen Reactions   Toprol Xl [Metoprolol Tartrate]     Depressive mood.      MEDICATION LIST PRIOR TO VISIT: Current Outpatient Medications on File Prior to Visit  Medication Sig Dispense Refill   acetaminophen (TYLENOL) 325 MG tablet Take 1-2 tablets (325-650 mg total) by mouth every 4 (four) hours as needed for mild pain.     aspirin 81 MG tablet Take 81 mg by mouth daily.     Cholecalciferol (VITAMIN D3) 1000 UNITS CAPS Take 2 capsules by mouth every evening.     diltiazem (CARDIZEM CD) 120 MG 24 hr capsule TAKE 1 CAPSULE BY MOUTH DAILY. HOLD IF TOP BLOOD PRESSURE NUMBER LESS THAN 100 MMHG OR HEART RATE LESS THAN 60 PULSE 90 capsule 1   magnesium oxide (MAG-OX) 400 MG tablet Take 400 mg by mouth daily.     metFORMIN (GLUCOPHAGE) 500 MG tablet 1 tab with dinner. 30 tablet 0   Multiple Vitamins-Minerals (PRESERVISION AREDS 2) CAPS Take 1 capsule by mouth 2 (two) times daily.      olmesartan (BENICAR) 20 MG tablet TAKE 1 TABLET BY MOUTH EVERY DAY 90 tablet 0   potassium citrate (UROCIT-K) 10 MEQ (1080 MG) SR tablet Take 10 mEq by mouth 2 (two) times daily.     simvastatin (ZOCOR) 20 MG tablet Take 1 tablet (20 mg total) by mouth every evening. 90 tablet 3   No current facility-administered medications on file prior to visit.    PAST MEDICAL HISTORY: Past Medical History:  Diagnosis Date   Allergy    Anxiety    Arthritis  Asthma    Complication of anesthesia    pt states has difficulty awakening; also has increased sinus drainage   Constipation    Edema, lower extremity    Encounter for interrogation of cardiac pacemaker 09/05/2018   Falls    GERD (gastroesophageal reflux disease)    H/O back injury    History of bronchitis    History of colon polyps    Hypertension    Imbalance    Kidney cysts    pt states not sure which kidney does see kidney specialist yearly pt states every thing okay currently    Legally  blind in right eye, as defined in [place]    Mobitz type II atrioventricular block 11/08/2016   Multiple gastric ulcers    Obesity    Pacemaker S/P St Jude Medical Assurity MRI model [id number] 05/20/2016   Renal stone    Retinal vein occlusion    Shortness of breath    Sinus node dysfunction ([place]) 12/16/2018   Tingling    left arm    Trace cataracts    Urinary frequency    Vitamin D deficiency     PAST SURGICAL HISTORY: Past Surgical History:  Procedure Laterality Date   CARDIOVASCULAR STRESS TEST  dec 2016   CHOLECYSTECTOMY     COLONOSCOPY  2011   EYE SURGERY Bilateral    Cataract surgery.    LEFT HEART CATH AND CORONARY ANGIOGRAPHY N/A 05/19/2016   Procedure: Left Heart Cath and Coronary Angiography;  Surgeon: Dixie Dials, MD;  Location: [facility];  Service: Cardiovascular;  Laterality: N/A;   PACEMAKER IMPLANT N/A 05/20/2016   Procedure: Pacemaker Implant;  Surgeon: Will Meredith Leeds, MD;  Location: [facility];  Service: Cardiovascular;  Laterality: N/A;   TONSILLECTOMY     TOTAL HIP ARTHROPLASTY Right 01/30/2015   Procedure: RIGHT TOTAL HIP ARTHROPLASTY ANTERIOR APPROACH AND REMOVAL LIPOMA RIGHT HIP;  Surgeon: Mcarthur Rossetti, MD;  Location: [facility];  Service: Orthopedics;  Laterality: Right;   UPPER GI ENDOSCOPY      FAMILY HISTORY: The patient's family history includes Asthma in her brother; Bipolar disorder in her mother; Depression in her mother; Eating disorder in her mother; Kidney disease in her mother; Liver disease in her mother; Obesity in her mother; Stroke in her mother.   SOCIAL HISTORY:  The patient  reports that she has never smoked. She has never used smokeless tobacco. She reports that she does not drink alcohol and does not use drugs.  Review of Systems  Cardiovascular:  Positive for dyspnea on exertion. Negative for chest pain, leg swelling, palpitations and syncope.  Respiratory:  Positive for shortness of breath.     PHYSICAL  EXAM:    08/12/2021    9:47 AM 08/05/2021    2:00 PM 07/15/2021    9:00 AM  Vitals with BMI  Height '5\' 1"'$  '5\' 1"'$  '5\' 1"'$   Weight 182 lbs 10 oz 178 lbs 181 lbs  BMI 34.52 41.66 06.30  Systolic 160 109 323  Diastolic 84 67 79  Pulse 95 89 87    CONSTITUTIONAL: Age-appropriate, hemodynamically stable, no acute distress.   SKIN: Skin is warm and dry. No rash noted. No cyanosis. No pallor. No jaundice HEAD: Normocephalic and atraumatic.  EYES: No scleral icterus MOUTH/THROAT: Moist oral membranes.  NECK: No JVD present. No thyromegaly noted. CHEST Normal respiratory effort. No intercostal retractions.  Pacemaker site is clean dry and intact (left infraclavicular region). LUNGS: Clear to auscultation bilaterally. No  stridor. No wheezes. No rales.  CARDIOVASCULAR: Regular rate and rhythm, positive S1-S2, no murmurs rubs or gallops appreciated. ABDOMINAL: Soft soft, nontender, nondistended, positive bowel sounds in all 4 quadrants no apparent ascites.  EXTREMITIES: No peripheral edema, warm to touch. HEMATOLOGIC: No significant bruising NEUROLOGIC: Oriented to person, place, and time. Nonfocal. Normal muscle tone.  PSYCHIATRIC: Normal mood and affect. Normal behavior. Cooperative  CARDIAC DATABASE: Pacemaker in situ: [place] MRI  dual-chamber pacemaker for symptomatic bradycardia in March 2018  Remote dual-chamber pacemaker transmission 06/07/2021: AP 22%, VP 99%.  Longevity 4 years and 6 months.  Lead impedance and thresholds within normal limits.  There were brief AT/AF episodes.  AT/AF burden <0.1%.  Normal pacemaker function.   Scheduled  In office pacemaker check 08/12/2021:  Single (S)/Dual (D)/BV: D. Presenting ASVP. Pacemaker dependant:  Yes. Underlying Complete heart block. AP 37%, VP 100%.  AMS Episodes 0.   HVR 0.  Longevity 4.2 Years. Magnet rate: >85%. Lead measurements: Stable. Histogram: Low (L)/normal (N)/high (H)  Normal. Patient activity Good.   Observations: Normal pacemaker function. Changes: Turn off VIP (patient is pacemaker dependant).  EKG: 04/02/2020: Atrial sensed and ventricular paced rhythm,  83bpm.   Echocardiogram: 08/22/2019: LVEF 55 to 60%, moderate LVH, grade 1 diastolic impairment, elevated LAP, pacemaker/ICD lead noted in the RV, mild MR, moderate TR, mild pulmonary hypertension with RVSP 39 mmHg.  Stress Testing:  January 23, 2015:Myocardial perfusion imaging is normal. Overall left ventricular systolic function was normal without regional wall motion abnormalities. The left ventricular ejection fraction was 78%.  This is a normal/low risk study.  Heart Catheterization: 04/2016 Dr. Dixie Dials: Normal coronaries  LABORATORY DATA:    Latest Ref Rng & Units 06/22/2021    9:56 AM 06/01/2020    3:41 PM 03/26/2019    3:02 PM  CBC  WBC 3.4 - 10.8 x10E3/uL 4.9  5.9  6.7   Hemoglobin 11.1 - 15.9 g/dL 13.4  13.4  14.4   Hematocrit 34.0 - 46.6 % 40.7  40.6  44.1   Platelets 150 - 450 x10E3/uL 206  221  243        Latest Ref Rng & Units 06/22/2021    9:56 AM 06/01/2020    3:41 PM 02/26/2020   10:45 AM  CMP  Glucose 70 - 99 mg/dL 92  84  87   BUN 8 - 27 mg/dL '20  18  19   '$ Creatinine 0.57 - 1.00 mg/dL 0.77  0.77  0.84   Sodium 134 - 144 mmol/L 140  142  142   Potassium 3.5 - 5.2 mmol/L 4.2  4.0  4.2   Chloride 96 - 106 mmol/L 101  102  102   CO2 20 - 29 mmol/L '27  24  25   '$ Calcium 8.7 - 10.3 mg/dL 10.4  10.3  10.2   Total Protein 6.0 - 8.5 g/dL 7.1  7.0    Total Bilirubin 0.0 - 1.2 mg/dL 0.8  0.8    Alkaline Phos 44 - 121 IU/L 113  125    AST 0 - 40 IU/L 20  18    ALT 0 - 32 IU/L 11  15      Lipid Panel  Lab Results  Component Value Date   CHOL 147 06/22/2021   HDL 67 06/22/2021   LDLCALC 65 06/22/2021   TRIG 77 06/22/2021   CHOLHDL 2.2 06/22/2021     Lab Results  Component Value Date   HGBA1C 5.6 03/26/2019  No components found for: "NTPROBNP" Lab Results  Component Value Date   TSH 3.360  03/26/2019   TSH 1.360 05/19/2016    Cardiac Panel (last 3 results) No results for input(s): "CKTOTAL", "CKMB", "TROPONINIHS", "RELINDX" in the last 72 hours.  IMPRESSION:    ICD-10-CM   1. Encounter for interrogation of cardiac pacemaker  Z45.018     2. Pacemaker S/P St Jude Medical Assurity MRI model [id number]  Z95.0     3. Mobitz type 2 second degree AV block  I44.1     4. Shortness of breath  Z60.10 Basic metabolic panel    Magnesium    Pro b natriuretic peptide (BNP)    PCV ECHOCARDIOGRAM COMPLETE    5. Benign hypertension  I10 PCV ECHOCARDIOGRAM COMPLETE    6. Mixed hyperlipidemia  E78.2     7. Class 1 obesity due to excess calories with serious comorbidity and body mass index (BMI) of 34.0 to 34.9 in adult  E66.09    Z68.34        RECOMMENDATIONS: Margaret Reese is a 76 y.o. female whose past medical history and cardiovascular risk factors include: Hypertension, secondary AV block status post pacemaker, hyperlipidemia, postmenopausal female, advanced age, obesity.  Encounter for interrogation of cardiac pacemaker / S/P St Jude Medical Assurity MRI model [id number] / Mobitz type 2 second degree AV block:  Pacemaker was interrogated/evaluated by my partner Dr. Einar Gip at today's office visit.  Summarized findings noted above.  Overall functioning within acceptable limits.  Shortness of breath: Etiology unspecified. She was noted to have a pulse ox of 96% on room air at rest and 88% after walking at the weight loss clinic. Clinically euvolemic on physical examination.  No expiratory wheezing, rales, or findings of overt heart failure on physical examination.  Would like to repeat an echocardiogram to reevaluate LVEF, diastolic function, and valvular heart disease. We will check BMP, NT proBNP. I have asked her to discuss this further with PCP and if needed possible pulmonary evaluation as well. We will follow.  Benign hypertension Blood pressure at today's office visit is  not well controlled. Home blood pressures are very well controlled. Medications reconciled. In the past she has not done well with Toprol-XL. No changes warranted at this time.  Mixed hyperlipidemia Currently on simvastatin.   She denies myalgia or other side effects. Most recent lipids dated May 2023, independently reviewed as noted above.  LDL currently at goal Currently managed by primary care provider.  Class 1 obesity due to excess calories with serious comorbidity and body mass index (BMI) of 34.0 to 34.9 in adult Body mass index is 34.5 kg/m. No significant change in weight since last office visit.  However, she is done great with regards to her weight loss journey since establishing care. I reviewed with the patient the importance of diet, regular physical activity/exercise, weight loss.   Patient is educated on increasing physical activity gradually as tolerated.  With the goal of moderate intensity exercise for 30 minutes a day 5 days a week.  FINAL MEDICATION LIST END OF ENCOUNTER: No orders of the defined types were placed in this encounter.    There are no discontinued medications.    Current Outpatient Medications:    acetaminophen (TYLENOL) 325 MG tablet, Take 1-2 tablets (325-650 mg total) by mouth every 4 (four) hours as needed for mild pain., Disp: , Rfl:    aspirin 81 MG tablet, Take 81 mg by mouth daily., Disp: , Rfl:  Cholecalciferol (VITAMIN D3) 1000 UNITS CAPS, Take 2 capsules by mouth every evening., Disp: , Rfl:    diltiazem (CARDIZEM CD) 120 MG 24 hr capsule, TAKE 1 CAPSULE BY MOUTH DAILY. HOLD IF TOP BLOOD PRESSURE NUMBER LESS THAN 100 MMHG OR HEART RATE LESS THAN 60 PULSE, Disp: 90 capsule, Rfl: 1   magnesium oxide (MAG-OX) 400 MG tablet, Take 400 mg by mouth daily., Disp: , Rfl:    metFORMIN (GLUCOPHAGE) 500 MG tablet, 1 tab with dinner., Disp: 30 tablet, Rfl: 0   Multiple Vitamins-Minerals (PRESERVISION AREDS 2) CAPS, Take 1 capsule by mouth 2 (two)  times daily. , Disp: , Rfl:    olmesartan (BENICAR) 20 MG tablet, TAKE 1 TABLET BY MOUTH EVERY DAY, Disp: 90 tablet, Rfl: 0   potassium citrate (UROCIT-K) 10 MEQ (1080 MG) SR tablet, Take 10 mEq by mouth 2 (two) times daily., Disp: , Rfl:    simvastatin (ZOCOR) 20 MG tablet, Take 1 tablet (20 mg total) by mouth every evening., Disp: 90 tablet, Rfl: 3  Orders Placed This Encounter  Procedures   Basic metabolic panel   Magnesium   Pro b natriuretic peptide (BNP)   PCV ECHOCARDIOGRAM COMPLETE   --Continue cardiac medications as reconciled in final medication list. --Return in about 4 weeks (around 09/09/2021) for Follow up , Dyspnea, Review test results. Or sooner if needed. --Continue follow-up with your primary care physician regarding the management of your other chronic comorbid conditions.  Patient's questions and concerns were addressed to her satisfaction. She voices understanding of the instructions provided during this encounter.   This note was created using a voice recognition software as a result there may be grammatical errors inadvertently enclosed that do not reflect the nature of this encounter. Every attempt is made to correct such errors.  Rex Kras, [place], [facility]  Pager: [phone] Office: [phone]

## 2021-08-18 ENCOUNTER — Ambulatory Visit
Admission: RE | Admit: 2021-08-18 | Discharge: 2021-08-18 | Disposition: A | Discharge status: Home / Self Care | Payer: Medicare Other | Source: Ambulatory Visit | Attending: Family Medicine | Admitting: Family Medicine

## 2021-08-18 ENCOUNTER — Encounter: Payer: Self-pay | Admitting: Family Medicine

## 2021-08-18 ENCOUNTER — Ambulatory Visit (INDEPENDENT_AMBULATORY_CARE_PROVIDER_SITE_OTHER): Payer: Medicare Other | Admitting: Family Medicine

## 2021-08-18 VITALS — BP 150/70 | HR 79 | Temp 98.4°F | Wt 183.4 lb

## 2021-08-18 DIAGNOSIS — I5032 Chronic diastolic (congestive) heart failure: Secondary | ICD-10-CM | POA: Diagnosis not present

## 2021-08-18 DIAGNOSIS — R0602 Shortness of breath: Secondary | ICD-10-CM | POA: Diagnosis not present

## 2021-08-18 DIAGNOSIS — Z950 Presence of cardiac pacemaker: Secondary | ICD-10-CM | POA: Diagnosis not present

## 2021-08-18 DIAGNOSIS — R0609 Other forms of dyspnea: Secondary | ICD-10-CM

## 2021-08-18 NOTE — Progress Notes (Signed)
   Subjective:    Patient ID: Margaret Reese, female    DOB: [birth year], 76 y.o.   MRN: [id number]  HPI She has noted some dyspnea on exertion and did mention it while at medical weight loss and was sent to cardiology.  She does have a pacer for AV block.  That note was evaluated.  She was set up to have blood work done as well as an echocardiogram in July.  Presently she does complain of some slight shortness of breath but no chest pain and questionable diaphoresis, no PND.Marland Kitchen  There is also a questionable past history of exercise-induced asthma.   Review of Systems     Objective:   Physical Exam Alert and in no distress cardiac exam shows regular rhythm without murmurs or gallops.  Lungs are clear to auscultation.       Assessment & Plan:  Dyspnea on exertion - Plan: Basic Metabolic Panel, Magnesium, Brain natriuretic peptide, DG Chest 2 View  MRI safe cardiac pacemaker in situ  Chronic diastolic (congestive) heart failure (HCC) - Plan: Basic Metabolic Panel, Magnesium, Brain natriuretic peptide, DG Chest 2 View I will go ahead and get the blood work that was ordered, get a chest x-ray.  Depending upon the results might need to order PFTs and possible pulmonary referral.

## 2021-08-19 ENCOUNTER — Other Ambulatory Visit: Payer: Self-pay

## 2021-08-19 ENCOUNTER — Other Ambulatory Visit: Payer: Medicare Other

## 2021-08-19 DIAGNOSIS — R0602 Shortness of breath: Secondary | ICD-10-CM

## 2021-08-19 LAB — BRAIN NATRIURETIC PEPTIDE: BNP: 33.9 pg/mL (ref 0.0–100.0)

## 2021-08-19 LAB — MAGNESIUM: Magnesium: 2.2 mg/dL (ref 1.6–2.3)

## 2021-08-19 LAB — BASIC METABOLIC PANEL
BUN/Creatinine Ratio: 18 (ref 12–28)
BUN: 15 mg/dL (ref 8–27)
CO2: 26 mmol/L (ref 20–29)
Calcium: 11.2 mg/dL — ABNORMAL HIGH (ref 8.7–10.3)
Chloride: 102 mmol/L (ref 96–106)
Creatinine, Ser: 0.82 mg/dL (ref 0.57–1.00)
Glucose: 90 mg/dL (ref 70–99)
Potassium: 4.6 mmol/L (ref 3.5–5.2)
Sodium: 143 mmol/L (ref 134–144)
eGFR: 74 mL/min/{1.73_m2} (ref >59)

## 2021-08-20 LAB — PTH, INTACT AND CALCIUM
Calcium: 9.9 mg/dL (ref 8.7–10.3)
PTH: 37 pg/mL (ref 15–65)

## 2021-08-20 NOTE — Progress Notes (Signed)
Patient got test done they are faxing over results

## 2021-08-23 LAB — PRO B NATRIURETIC PEPTIDE: NT-Pro BNP: 150 pg/mL (ref 0–738)

## 2021-08-23 LAB — SPECIMEN STATUS REPORT

## 2021-08-25 ENCOUNTER — Ambulatory Visit (INDEPENDENT_AMBULATORY_CARE_PROVIDER_SITE_OTHER): Payer: Medicare Other | Admitting: Family Medicine

## 2021-08-25 ENCOUNTER — Encounter: Payer: Self-pay | Admitting: Family Medicine

## 2021-08-25 VITALS — BP 132/62 | HR 77 | Temp 98.4°F | Wt 186.2 lb

## 2021-08-25 DIAGNOSIS — R942 Abnormal results of pulmonary function studies: Secondary | ICD-10-CM | POA: Diagnosis not present

## 2021-08-25 DIAGNOSIS — R0602 Shortness of breath: Secondary | ICD-10-CM | POA: Diagnosis not present

## 2021-08-25 NOTE — Progress Notes (Signed)
External Labs: Collected: 08/18/2021 provided by PCP Sodium 143, potassium 4.6, chloride 102, bicarb 26. BUN 15, creatinine 0.82. BNP 33.9 Magnesium 2.2

## 2021-08-25 NOTE — Progress Notes (Signed)
   Subjective:    Patient ID: Margaret Reese, female    DOB: 14-Jan-1946, 76 y.o.   MRN: [id number]  HPI She is here for consult concerning continued difficulty with dyspnea on exertion.  Further history indicates that she did have a history of exercise-induced asthma however since that time she has lost a tremendous amount of weight.  She was noted to desaturate with walking down to 88%.  She is here today for PFT.   Review of Systems     Objective:   Physical Exam Alert and in no distress.  PFT showed severe obstruction.       Assessment & Plan:   Abnormal PFT - Plan: Ambulatory referral to Pulmonology  SOB (shortness of breath) - Plan: Spirometry with graph, Ambulatory referral to Pulmonology, CANCELED: Spirometry with graph I explained that it is now time to look into this in more detail and will refer to pulmonology to help with this.

## 2021-08-27 DIAGNOSIS — I10 Essential (primary) hypertension: Secondary | ICD-10-CM | POA: Diagnosis not present

## 2021-09-01 ENCOUNTER — Ambulatory Visit: Payer: Medicare Other

## 2021-09-01 DIAGNOSIS — R0602 Shortness of breath: Secondary | ICD-10-CM | POA: Diagnosis not present

## 2021-09-01 DIAGNOSIS — I10 Essential (primary) hypertension: Secondary | ICD-10-CM | POA: Diagnosis not present

## 2021-09-06 ENCOUNTER — Other Ambulatory Visit (INDEPENDENT_AMBULATORY_CARE_PROVIDER_SITE_OTHER): Payer: Self-pay | Admitting: Adult Health

## 2021-09-06 DIAGNOSIS — I441 Atrioventricular block, second degree: Secondary | ICD-10-CM | POA: Diagnosis not present

## 2021-09-06 DIAGNOSIS — Z45018 Encounter for adjustment and management of other part of cardiac pacemaker: Secondary | ICD-10-CM | POA: Diagnosis not present

## 2021-09-06 DIAGNOSIS — R632 Polyphagia: Secondary | ICD-10-CM

## 2021-09-06 NOTE — Progress Notes (Signed)
Called and spoke to patient she voiced understanding

## 2021-09-08 ENCOUNTER — Ambulatory Visit (INDEPENDENT_AMBULATORY_CARE_PROVIDER_SITE_OTHER): Payer: Medicare Other | Admitting: Adult Health

## 2021-09-08 ENCOUNTER — Encounter (INDEPENDENT_AMBULATORY_CARE_PROVIDER_SITE_OTHER): Payer: Self-pay | Admitting: Adult Health

## 2021-09-08 VITALS — BP 138/70 | HR 84 | Temp 97.5°F | Ht 61.0 in | Wt 179.0 lb

## 2021-09-08 DIAGNOSIS — R0609 Other forms of dyspnea: Secondary | ICD-10-CM | POA: Diagnosis not present

## 2021-09-08 DIAGNOSIS — R632 Polyphagia: Secondary | ICD-10-CM

## 2021-09-08 DIAGNOSIS — Z6834 Body mass index (BMI) 34.0-34.9, adult: Secondary | ICD-10-CM

## 2021-09-08 DIAGNOSIS — E669 Obesity, unspecified: Secondary | ICD-10-CM

## 2021-09-08 MED ORDER — METFORMIN HCL 500 MG PO TABS: ORAL_TABLET | ORAL | 0 refills | Status: DC

## 2021-09-13 ENCOUNTER — Encounter: Payer: Self-pay | Admitting: Cardiology

## 2021-09-13 ENCOUNTER — Ambulatory Visit: Payer: Medicare Other | Admitting: Cardiology

## 2021-09-13 ENCOUNTER — Other Ambulatory Visit: Payer: Self-pay | Admitting: Cardiology

## 2021-09-13 VITALS — BP 142/59 | HR 75 | Temp 98.2°F | Resp 16 | Ht 61.0 in | Wt 187.8 lb

## 2021-09-13 DIAGNOSIS — I10 Essential (primary) hypertension: Secondary | ICD-10-CM

## 2021-09-13 DIAGNOSIS — Z950 Presence of cardiac pacemaker: Secondary | ICD-10-CM | POA: Diagnosis not present

## 2021-09-13 DIAGNOSIS — I441 Atrioventricular block, second degree: Secondary | ICD-10-CM

## 2021-09-13 DIAGNOSIS — R0602 Shortness of breath: Secondary | ICD-10-CM | POA: Diagnosis not present

## 2021-09-13 DIAGNOSIS — E782 Mixed hyperlipidemia: Secondary | ICD-10-CM | POA: Diagnosis not present

## 2021-09-13 DIAGNOSIS — Z6835 Body mass index (BMI) 35.0-35.9, adult: Secondary | ICD-10-CM | POA: Diagnosis not present

## 2021-09-13 MED ORDER — BUMETANIDE 1 MG PO TABS: 1.0000 mg | ORAL_TABLET | Freq: Every morning | ORAL | 0 refills | Status: DC

## 2021-09-13 NOTE — Progress Notes (Signed)
Margaret Reese Date of Birth: 1945/11/17 MRN: [id number] Primary Care Provider:Lalonde, Elyse Jarvis, MD Former Cardiology Providers: Dr. Doylene Canard, Dr. Terrence Dupont, Dr. Einar Gip, and Jeri Lager, APRN, FNP-C. Primary Cardiologist: Rex Kras, DO, [facility] (established care 07/25/2019) Electrophysiologist: Dr. Allegra Lai  Date: 09/13/21 Last Office Visit: 08/12/2021  Chief Complaint  Patient presents with   Shortness of Breath   Results   Follow-up    HPI  Margaret Reese is a 76 y.o.  female whose past medical history and cardiovascular risk factors include: Hypertension, secondary AV block status post pacemaker, hyperlipidemia, postmenopausal female, advanced age, obesity.  Patient is being followed by the practice given her underlying pacemaker due to second-degree AV block and hypertension management.  When patient presented to the office in June 2023 she was complaining of symptoms of shortness of breath that have been present for the last several months but progressively worse.  She also mentioned having low oxygenation saturations around 88% while walking and her weight loss clinic.  Clinically appears to be euvolemic but has been having history of bronchitis and exercise-induced ischemia in the past.  At the last office visit the shared decision was to check labs and repeat an echocardiogram.  Repeat labs note BNP within normal limits.  Echo notes preserved LVEF, with mild progression in tricuspid regurgitation and RVSP to suggest pulmonary hypertension.  Patient also had PFTs with her PCP and due to the findings is referred to pulmonary medicine next week.  Patient denies PND or orthopnea.  But has gained approximately 5 pounds since last office visit.  Patient does not favor laying flat as opposed to sitting in upright position patient.  Patient states that her home blood pressures are usually less than 120 mmHg.  Outside labs independently reviewed and noted below for  reference.  FUNCTIONAL STATUS: She does floor exercise for 55mnutes a day. She is enrolled in wellness program and has lost 40 pounds since January 2021.    ALLERGIES: Allergies  Allergen Reactions   Toprol Xl [Metoprolol Tartrate]     Depressive mood.      MEDICATION LIST PRIOR TO VISIT: Current Outpatient Medications on File Prior to Visit  Medication Sig Dispense Refill   acetaminophen (TYLENOL) 325 MG tablet Take 1-2 tablets (325-650 mg total) by mouth every 4 (four) hours as needed for mild pain.     aspirin 81 MG tablet Take 81 mg by mouth daily.     diltiazem (CARDIZEM CD) 120 MG 24 hr capsule TAKE 1 CAPSULE BY MOUTH DAILY. HOLD IF TOP BLOOD PRESSURE NUMBER LESS THAN 100 MMHG OR HEART RATE LESS THAN 60 PULSE 90 capsule 1   metFORMIN (GLUCOPHAGE) 500 MG tablet 1 tab with dinner. 90 tablet 0   Multiple Vitamins-Minerals (PRESERVISION AREDS 2) CAPS Take 1 capsule by mouth 2 (two) times daily.      olmesartan (BENICAR) 20 MG tablet TAKE 1 TABLET BY MOUTH EVERY DAY 90 tablet 0   potassium citrate (UROCIT-K) 10 MEQ (1080 MG) SR tablet Take 10 mEq by mouth 2 (two) times daily.     simvastatin (ZOCOR) 20 MG tablet Take 1 tablet (20 mg total) by mouth every evening. 90 tablet 3   No current facility-administered medications on file prior to visit.    PAST MEDICAL HISTORY: Past Medical History:  Diagnosis Date   Allergy    Anxiety    Arthritis    Asthma    Complication of anesthesia    pt states has difficulty awakening; also  has increased sinus drainage   Constipation    Edema, lower extremity    Encounter for interrogation of cardiac pacemaker 09/05/2018   Falls    GERD (gastroesophageal reflux disease)    H/O back injury    History of bronchitis    History of colon polyps    Hypertension    Imbalance    Kidney cysts    pt states not sure which kidney does see kidney specialist yearly pt states every thing okay currently    Legally blind in right eye, as defined in [place]     Mobitz type II atrioventricular block 11/08/2016   Multiple gastric ulcers    Obesity    Pacemaker S/P St Jude Medical Assurity MRI model [id number] 05/20/2016   Renal stone    Retinal vein occlusion    Shortness of breath    Sinus node dysfunction ([place]) 12/16/2018   Tingling    left arm    Trace cataracts    Urinary frequency    Vitamin D deficiency     PAST SURGICAL HISTORY: Past Surgical History:  Procedure Laterality Date   CARDIOVASCULAR STRESS TEST  dec 2016   CHOLECYSTECTOMY     COLONOSCOPY  2011   EYE SURGERY Bilateral    Cataract surgery.    LEFT HEART CATH AND CORONARY ANGIOGRAPHY N/A 05/19/2016   Procedure: Left Heart Cath and Coronary Angiography;  Surgeon: Dixie Dials, MD;  Location: [facility];  Service: Cardiovascular;  Laterality: N/A;   PACEMAKER IMPLANT N/A 05/20/2016   Procedure: Pacemaker Implant;  Surgeon: Will Meredith Leeds, MD;  Location: [facility];  Service: Cardiovascular;  Laterality: N/A;   TONSILLECTOMY     TOTAL HIP ARTHROPLASTY Right 01/30/2015   Procedure: RIGHT TOTAL HIP ARTHROPLASTY ANTERIOR APPROACH AND REMOVAL LIPOMA RIGHT HIP;  Surgeon: Mcarthur Rossetti, MD;  Location: [facility];  Service: Orthopedics;  Laterality: Right;   UPPER GI ENDOSCOPY      FAMILY HISTORY: The patient's family history includes Asthma in her brother; Bipolar disorder in her mother; Depression in her mother; Eating disorder in her mother; Kidney disease in her mother; Liver disease in her mother; Obesity in her mother; Stroke in her mother.   SOCIAL HISTORY:  The patient  reports that she has never smoked. She has never used smokeless tobacco. She reports that she does not drink alcohol and does not use drugs.  Review of Systems  Cardiovascular:  Positive for dyspnea on exertion. Negative for chest pain, leg swelling, palpitations and syncope.  Respiratory:  Positive for shortness of breath.     PHYSICAL EXAM:    09/13/2021    1:33 PM 09/08/2021    11:15 AM 09/08/2021   11:00 AM  Vitals with BMI  Height '5\' 1"'$   '5\' 1"'$   Weight 187 lbs 13 oz  179 lbs  BMI 70.1  77.93  Systolic 903 009 233  Diastolic 59 70 75  Pulse 75  84    CONSTITUTIONAL: Age-appropriate, hemodynamically stable, no acute distress.   SKIN: Skin is warm and dry. No rash noted. No cyanosis. No pallor. No jaundice HEAD: Normocephalic and atraumatic.  EYES: No scleral icterus MOUTH/THROAT: Moist oral membranes.  NECK: No JVD present. No thyromegaly noted. CHEST Normal respiratory effort. No intercostal retractions.  Pacemaker site is clean dry and intact (left infraclavicular region). LUNGS: Clear to auscultation bilaterally. No stridor. No wheezes. No rales.  CARDIOVASCULAR: Regular rate and rhythm, positive S1-S2, no murmurs rubs or gallops appreciated. ABDOMINAL:  Soft soft, nontender, nondistended, positive bowel sounds in all 4 quadrants no apparent ascites.  EXTREMITIES: No peripheral edema, warm to touch. HEMATOLOGIC: No significant bruising NEUROLOGIC: Oriented to person, place, and time. Nonfocal. Normal muscle tone.  PSYCHIATRIC: Normal mood and affect. Normal behavior. Cooperative  CARDIAC DATABASE: Pacemaker in situ: Brenda MRI  dual-chamber pacemaker for symptomatic bradycardia in March 2018  Remote dual-chamber pacemaker transmission 06/07/2021: AP 22%, VP 99%.  Longevity 4 years and 6 months.  Lead impedance and thresholds within normal limits.  There were brief AT/AF episodes.  AT/AF burden <0.1%.  Normal pacemaker function.   Scheduled  In office pacemaker check 08/12/2021:  Single (S)/Dual (D)/BV: D. Presenting ASVP. Pacemaker dependant:  Yes. Underlying Complete heart block. AP 37%, VP 100%.  AMS Episodes 0.   HVR 0.  Longevity 4.2 Years. Magnet rate: >85%. Lead measurements: Stable. Histogram: Low (L)/normal (N)/high (H)  Normal. Patient activity Good.  Observations: Normal pacemaker function. Changes: Turn off VIP (patient is  pacemaker dependant).  EKG: 09/13/2021, Atrial sensed ventricular paced rhythm, 80 bpm.    Echocardiogram: 08/22/2019: LVEF 55 to 60%, moderate LVH, grade 1 diastolic impairment, elevated LAP, pacemaker/ICD lead noted in the RV, mild MR, moderate TR, mild pulmonary hypertension with RVSP 39 mmHg.  09/01/2021: Left ventricle cavity is normal in size. Moderate concentric hypertrophy of the left ventricle. Abnormal septal wall motion due to left bundle branch block. Normal LV systolic function with EF 57%. Indeterminate diastolic filling pattern.  Mild to moderate tricuspid regurgitation. RVSP measures 41 mmHg. No significant change compared to previous study on 08/22/2019.  Stress Testing:  January 23, 2015:Myocardial perfusion imaging is normal. Overall left ventricular systolic function was normal without regional wall motion abnormalities. The left ventricular ejection fraction was 78%.  This is a normal/low risk study.  Heart Catheterization: 04/2016 Dr. Dixie Dials: Normal coronaries  LABORATORY DATA:    Latest Ref Rng & Units 06/22/2021    9:56 AM 06/01/2020    3:41 PM 03/26/2019    3:02 PM  CBC  WBC 3.4 - 10.8 x10E3/uL 4.9  5.9  6.7   Hemoglobin 11.1 - 15.9 g/dL 13.4  13.4  14.4   Hematocrit 34.0 - 46.6 % 40.7  40.6  44.1   Platelets 150 - 450 x10E3/uL 206  221  243        Latest Ref Rng & Units 08/19/2021    1:40 PM 08/18/2021   10:55 AM 06/22/2021    9:56 AM  CMP  Glucose 70 - 99 mg/dL  90  92   BUN 8 - 27 mg/dL  15  20   Creatinine 0.57 - 1.00 mg/dL  0.82  0.77   Sodium 134 - 144 mmol/L  143  140   Potassium 3.5 - 5.2 mmol/L  4.6  4.2   Chloride 96 - 106 mmol/L  102  101   CO2 20 - 29 mmol/L  26  27   Calcium 8.7 - 10.3 mg/dL 9.9  11.2  10.4   Total Protein 6.0 - 8.5 g/dL   7.1   Total Bilirubin 0.0 - 1.2 mg/dL   0.8   Alkaline Phos 44 - 121 IU/L   113   AST 0 - 40 IU/L   20   ALT 0 - 32 IU/L   11     Lipid Panel  Lab Results  Component Value Date   CHOL 147  06/22/2021   HDL 67 06/22/2021   LDLCALC 65 06/22/2021  TRIG 77 06/22/2021   CHOLHDL 2.2 06/22/2021     Lab Results  Component Value Date   HGBA1C 5.6 03/26/2019   No components found for: "NTPROBNP" Lab Results  Component Value Date   TSH 3.360 03/26/2019   TSH 1.360 05/19/2016    Cardiac Panel (last 3 results) No results for input(s): "CKTOTAL", "CKMB", "TROPONINIHS", "RELINDX" in the last 72 hours.  External Labs: Collected: 08/18/2021 provided by PCP Sodium 143, potassium 4.6, chloride 102, bicarb 26. BUN 15, creatinine 0.82. BNP 33.9 Magnesium 2.2  IMPRESSION:    ICD-10-CM   1. Shortness of breath  R06.02 EKG 12-Lead    bumetanide (BUMEX) 1 MG tablet    Pro b natriuretic peptide (BNP)    Basic metabolic panel    Magnesium    2. Benign hypertension  I10 bumetanide (BUMEX) 1 MG tablet    3. Mixed hyperlipidemia  E78.2     4. Pacemaker S/P St Jude Medical Assurity MRI model [id number]  Z95.0     5. Mobitz type 2 second degree AV block  I44.1     6. Class 2 severe obesity due to excess calories with serious comorbidity and body mass index (BMI) of 35.0 to 35.9 in adult [facility])  E66.01    Z68.35        RECOMMENDATIONS: Margaret Reese is a 76 y.o. female whose past medical history and cardiovascular risk factors include: Hypertension, secondary AV block status post pacemaker, hyperlipidemia, postmenopausal female, advanced age, obesity.  Shortness of breath: Worsening.  Not in overt heart failure Echocardiogram notes preserved LVEF, mild progression in tricuspid regurgitation and RVSP to suggest pulmonary hypertension.  Outside labs independently reviewed and noted above.  BNP within normal limits. Patient has gained 5 pounds since last office visit a month ago, given her dyspnea on exertion, shared decision was to add Bumex 1 mg p.o. every morning.  Labs in 1 week to evaluate kidney function and electrolytes. Patient also had PFTs with PCP and is now being  referred to pulmonary medicine.  She has an upcoming appointment on Monday for further evaluation and management. Based on her symptoms the presence of platypnea orthodeoxia syndrome is low but not ruled out. Patient remains dyspneic despite up titration of diuretics and evaluation by pulmonary medicine will consider repeat limited echocardiogram with bubble study to evaluate for possible PFO.  Would like to see her back in 1 month.  Encounter for interrogation of cardiac pacemaker / S/P St Jude Medical Assurity MRI model [id number] / Mobitz type 2 second degree AV block:  Last interrogated June 2023. Results reviewed and noted above.  Benign hypertension Blood pressure at today's office visit is not well controlled. Home blood pressures are well controlled. We will add Bumex as discussed above Medications reconciled. In the past she has not done well with Toprol-XL.  Mixed hyperlipidemia Currently on simvastatin.   She denies myalgia or other side effects. Most recent lipids dated May 2023, independently reviewed as noted above.  LDL currently at goal Currently managed by primary care provider.  Class 2 obesity due to excess calories with serious comorbidity and body mass index (BMI) of 35.0 to 35.9 in adult Body mass index is 35.48 kg/m. Gained 5 pounds since last office visit. We will uptitrate diuretic therapy as discussed above. We emphasized the importance of a low-salt diet. Patient is attributing her weight gain with both the increased salt and not exercising regularly.   FINAL MEDICATION LIST END OF ENCOUNTER: Meds ordered this  encounter  Medications   bumetanide (BUMEX) 1 MG tablet    Sig: Take 1 tablet (1 mg total) by mouth every morning.    Dispense:  30 tablet    Refill:  0     There are no discontinued medications.    Current Outpatient Medications:    acetaminophen (TYLENOL) 325 MG tablet, Take 1-2 tablets (325-650 mg total) by mouth every 4 (four) hours as  needed for mild pain., Disp: , Rfl:    aspirin 81 MG tablet, Take 81 mg by mouth daily., Disp: , Rfl:    bumetanide (BUMEX) 1 MG tablet, Take 1 tablet (1 mg total) by mouth every morning., Disp: 30 tablet, Rfl: 0   diltiazem (CARDIZEM CD) 120 MG 24 hr capsule, TAKE 1 CAPSULE BY MOUTH DAILY. HOLD IF TOP BLOOD PRESSURE NUMBER LESS THAN 100 MMHG OR HEART RATE LESS THAN 60 PULSE, Disp: 90 capsule, Rfl: 1   metFORMIN (GLUCOPHAGE) 500 MG tablet, 1 tab with dinner., Disp: 90 tablet, Rfl: 0   Multiple Vitamins-Minerals (PRESERVISION AREDS 2) CAPS, Take 1 capsule by mouth 2 (two) times daily. , Disp: , Rfl:    olmesartan (BENICAR) 20 MG tablet, TAKE 1 TABLET BY MOUTH EVERY DAY, Disp: 90 tablet, Rfl: 0   potassium citrate (UROCIT-K) 10 MEQ (1080 MG) SR tablet, Take 10 mEq by mouth 2 (two) times daily., Disp: , Rfl:    simvastatin (ZOCOR) 20 MG tablet, Take 1 tablet (20 mg total) by mouth every evening., Disp: 90 tablet, Rfl: 3  Orders Placed This Encounter  Procedures   Pro b natriuretic peptide (BNP)   Basic metabolic panel   Magnesium   EKG 12-Lead   --Continue cardiac medications as reconciled in final medication list. --Return in about 4 weeks (around 10/11/2021) for Follow up, Dyspnea. Or sooner if needed. --Continue follow-up with your primary care physician regarding the management of your other chronic comorbid conditions.  Patient's questions and concerns were addressed to her satisfaction. She voices understanding of the instructions provided during this encounter.   This note was created using a voice recognition software as a result there may be grammatical errors inadvertently enclosed that do not reflect the nature of this encounter. Every attempt is made to correct such errors.  Rex Kras, [place], [facility]  Pager: [phone] Office: [phone]

## 2021-09-13 NOTE — Progress Notes (Signed)
Chief Complaint:   OBESITY Margaret Reese is here to discuss her progress with her obesity treatment plan along with follow-up of her obesity related diagnoses. Margaret Reese is on the Category 1 Plan and states she is following her eating plan approximately 10% of the time. Margaret Reese states she is doing low impact aerobics for 30 minutes 6 times per week.  Today's visit was #: 21 Starting weight: 212 lbs Starting date: 03/26/2019 Today's weight: 179 lbs Today's date: 09/08/2021 Total lbs lost to date: 33 Total lbs lost since last in-office visit: 0  Interim History: Since last OV at [place] has seen her PCP and cardiology for dyspnea on exertion.  Subjective:   1. Dyspnea on exertion On 08/18/2021 PCP OV Notes: She has noted some dyspnea on exertion and did mention it while at medical weight loss and was sent to cardiology.  She does have a pacer for AV block.  That note was evaluated.  She was set up to have blood work done as well as an echocardiogram in July.  Presently she does complain of some slight shortness of breath but no chest pain and questionable diaphoresis, no PND.Marland Kitchen  There is also a questionable past history of exercise-induced asthma. Dyspnea on exertion - Plan: Basic Metabolic Panel, Magnesium, Brain natriuretic peptide, DG Chest 2 View MRI safe cardiac pacemaker in situ Chronic diastolic (congestive) heart failure (HCC) - Plan: Basic Metabolic Panel, Magnesium, Brain natriuretic peptide, DG Chest 2 View I will go ahead and get the blood work that was ordered, get a chest x-ray.  Depending upon the results might need to order PFTs and possible pulmonary referral.   On 08/25/2021 her PCP referred her to pulmonology.   She continues to experience dyspnea with exertion.  2. Polyphagia Margaret Reese endorses decreased polyphagia with daily metformin 500 mg at dinner.   She denies GI upset at present.  3. Hypercalcemia On 08/18/2021, Margaret Reese's calcium level was 11.2, repeat on 08/19/2021 calcium  level was 9.9.   Her PCP instructed her to stop vitamin D3 2000 units once daily and magnesium oxide 400 mg once daily- she has followed this instruction.  Assessment/Plan:   1. Dyspnea on exertion Margaret Reese will follow-up with her pulmonologist and PCP as directed.  2. Polyphagia Margaret Reese will continue metformin 500 mg once daily with dinner, and we will refill for 90 days.  - metFORMIN (GLUCOPHAGE) 500 MG tablet; 1 tab with dinner.  Dispense: 90 tablet; Refill: 0  3. Hypercalcemia Margaret Reese is to remain off vitamin D OTC and magnesium oxide.  4. Obesity, current BMI 34.0 Margaret Reese is currently in the action stage of change. As such, her goal is to continue with weight loss efforts. She has agreed to the Category 1 Plan.   Exercise goals: As is.   Behavioral modification strategies: increasing lean protein intake, decreasing simple carbohydrates, meal planning and cooking strategies, keeping healthy foods in the home, and planning for success.  Margaret Reese has agreed to follow-up with our clinic in 4 weeks. She was informed of the importance of frequent follow-up visits to maximize her success with intensive lifestyle modifications for her multiple health conditions.   Objective:   Blood pressure 138/70, pulse 84, temperature (!) 97.5 F (36.4 C), height '5\' 1"'$  (1.549 m), weight 179 lb (81.2 kg), SpO2 98 %. Body mass index is 33.82 kg/m.  General: Cooperative, alert, well developed, in no acute distress. HEENT: Conjunctivae and lids unremarkable. Cardiovascular: Regular rhythm.  Lungs: Normal work of breathing. Neurologic: No  focal deficits.   Lab Results  Component Value Date   CREATININE 0.82 08/18/2021   BUN 15 08/18/2021   NA 143 08/18/2021   K 4.6 08/18/2021   CL 102 08/18/2021   CO2 26 08/18/2021   Lab Results  Component Value Date   ALT 11 06/22/2021   AST 20 06/22/2021   ALKPHOS 113 06/22/2021   BILITOT 0.8 06/22/2021   Lab Results  Component Value Date   HGBA1C 5.6  03/26/2019   Lab Results  Component Value Date   INSULIN 8.1 03/26/2019   Lab Results  Component Value Date   TSH 3.360 03/26/2019   Lab Results  Component Value Date   CHOL 147 06/22/2021   HDL 67 06/22/2021   LDLCALC 65 06/22/2021   TRIG 77 06/22/2021   CHOLHDL 2.2 06/22/2021   Lab Results  Component Value Date   VD25OH 31.6 03/26/2019   Lab Results  Component Value Date   WBC 4.9 06/22/2021   HGB 13.4 06/22/2021   HCT 40.7 06/22/2021   MCV 88 06/22/2021   PLT 206 06/22/2021   Lab Results  Component Value Date   IRON 64 03/26/2019   TIBC 301 03/26/2019   FERRITIN 260 (H) 03/26/2019    Obesity Behavioral Intervention:   Approximately 15 minutes were spent on the discussion below.  ASK: We discussed the diagnosis of obesity with Margaret Reese today and Margaret Reese agreed to give [name] permission to discuss obesity behavioral modification therapy today.  ASSESS: Margaret Reese has the diagnosis of obesity and her BMI today is 34.0. Margaret Reese is in the action stage of change.   ADVISE: Margaret Reese was educated on the multiple health risks of obesity as well as the benefit of weight loss to improve her health. She was advised of the need for long term treatment and the importance of lifestyle modifications to improve her current health and to decrease her risk of future health problems.  AGREE: Multiple dietary modification options and treatment options were discussed and Margaret Reese agreed to follow the recommendations documented in the above note.  ARRANGE: Margaret Reese was educated on the importance of frequent visits to treat obesity as outlined per CMS and USPSTF guidelines and agreed to schedule her next follow up appointment today.  Attestation Statements:   Reviewed by clinician on day of visit: allergies, medications, problem list, medical history, surgical history, family history, social history, and previous encounter notes.   Margaret Reese, am acting as transcriptionist for Margaret Marble,  NP.  I have reviewed the above documentation for accuracy and completeness, and I agree with the above. -  Margaret Reese d. Margaret Brunner, NP-C

## 2021-09-18 DIAGNOSIS — L089 Local infection of the skin and subcutaneous tissue, unspecified: Secondary | ICD-10-CM | POA: Diagnosis not present

## 2021-09-18 DIAGNOSIS — L509 Urticaria, unspecified: Secondary | ICD-10-CM | POA: Diagnosis not present

## 2021-09-18 NOTE — Progress Notes (Unsigned)
Synopsis: Referred in July 2023 for an abnormal PFT.  She has a background of recurrent bronchitis, mobitz 2 AV block with pacemaker in place, hypertension, GERD, and obesity.  Subjective:   PATIENT ID: Margaret Reese GENDER: female DOB: 10/16/1945, MRN: [id number]   HPI  No chief complaint on file.   ***  Past Medical History:  Diagnosis Date   Allergy    Anxiety    Arthritis    Asthma    Complication of anesthesia    pt states has difficulty awakening; also has increased sinus drainage   Constipation    Edema, lower extremity    Encounter for interrogation of cardiac pacemaker 09/05/2018   Falls    GERD (gastroesophageal reflux disease)    H/O back injury    History of bronchitis    History of colon polyps    Hypertension    Imbalance    Kidney cysts    pt states not sure which kidney does see kidney specialist yearly pt states every thing okay currently    Legally blind in right eye, as defined in [place]    Mobitz type II atrioventricular block 11/08/2016   Multiple gastric ulcers    Obesity    Pacemaker S/P [employer] MRI model [id number] 05/20/2016   Renal stone    Retinal vein occlusion    Shortness of breath    Sinus node dysfunction ([place]) 12/16/2018   Tingling    left arm    Trace cataracts    Urinary frequency    Vitamin D deficiency      Family History  Problem Relation Age of Onset   Depression Mother    Stroke Mother    Kidney disease Mother    Bipolar disorder Mother    Liver disease Mother    Eating disorder Mother    Obesity Mother    Asthma Brother      Social History   Socioeconomic History   Marital status: Married    Spouse name: Not on file   Number of children: 2   Years of education: Not on file   Highest education level: Not on file  Occupational History   Occupation: retired Careers adviser  Tobacco Use   Smoking status: Never   Smokeless tobacco: Never  Vaping Use   Vaping Use: Never used  Substance  and Sexual Activity   Alcohol use: No   Drug use: No   Sexual activity: Yes  Other Topics Concern   Not on file  Social History Narrative   Not on file   Social Determinants of Health   Financial Resource Strain: Not on file  Food Insecurity: Not on file  Transportation Needs: Not on file  Physical Activity: Not on file  Stress: Not on file  Social Connections: Not on file  Intimate Partner Violence: Not on file     Allergies  Allergen Reactions   Toprol Xl [Metoprolol Tartrate]     Depressive mood.      Outpatient Medications Prior to Visit  Medication Sig Dispense Refill   acetaminophen (TYLENOL) 325 MG tablet Take 1-2 tablets (325-650 mg total) by mouth every 4 (four) hours as needed for mild pain.     aspirin 81 MG tablet Take 81 mg by mouth daily.     bumetanide (BUMEX) 1 MG tablet TAKE 1 TABLET(1 MG) BY MOUTH EVERY MORNING 90 tablet 0   diltiazem (CARDIZEM CD) 120 MG 24 hr capsule TAKE 1 CAPSULE BY MOUTH  DAILY. HOLD IF TOP BLOOD PRESSURE NUMBER LESS THAN 100 MMHG OR HEART RATE LESS THAN 60 PULSE 90 capsule 1   metFORMIN (GLUCOPHAGE) 500 MG tablet 1 tab with dinner. 90 tablet 0   Multiple Vitamins-Minerals (PRESERVISION AREDS 2) CAPS Take 1 capsule by mouth 2 (two) times daily.      olmesartan (BENICAR) 20 MG tablet TAKE 1 TABLET BY MOUTH EVERY DAY 90 tablet 0   potassium citrate (UROCIT-K) 10 MEQ (1080 MG) SR tablet Take 10 mEq by mouth 2 (two) times daily.     simvastatin (ZOCOR) 20 MG tablet Take 1 tablet (20 mg total) by mouth every evening. 90 tablet 3   No facility-administered medications prior to visit.    ROS     Objective:  Physical Exam   There were no vitals filed for this visit.  ***  CBC    Component Value Date/Time   WBC 4.9 06/22/2021 0956   WBC 6.7 11/08/2016 1035   RBC 4.65 06/22/2021 0956   RBC 4.46 11/08/2016 1035   HGB 13.4 06/22/2021 0956   HCT 40.7 06/22/2021 0956   PLT 206 06/22/2021 0956   MCV 88 06/22/2021 0956   MCH 28.8  06/22/2021 0956   MCH 28.5 11/08/2016 1035   MCHC 32.9 06/22/2021 0956   MCHC 32.6 11/08/2016 1035   RDW 13.1 06/22/2021 0956   LYMPHSABS 1.9 06/22/2021 0956   MONOABS 330 09/15/2015 0914   EOSABS 0.2 06/22/2021 0956   BASOSABS 0.0 06/22/2021 0956     Chest imaging: 07/2021 CXR personally reviewed boot shaped heart, dual lead pacemaker, clear lung fields  PFT: 08/2021 spirometry: ratio 67% FEV1 0.7L 40% pred  Labs:  Path:  Echo: 08/2021 LVEF 65%, RVSP 41 mild to mod TR, other valves OK  Heart Catheterization:       Assessment & Plan:   No diagnosis found.  Discussion: ***    Current Outpatient Medications:    acetaminophen (TYLENOL) 325 MG tablet, Take 1-2 tablets (325-650 mg total) by mouth every 4 (four) hours as needed for mild pain., Disp: , Rfl:    aspirin 81 MG tablet, Take 81 mg by mouth daily., Disp: , Rfl:    bumetanide (BUMEX) 1 MG tablet, TAKE 1 TABLET(1 MG) BY MOUTH EVERY MORNING, Disp: 90 tablet, Rfl: 0   diltiazem (CARDIZEM CD) 120 MG 24 hr capsule, TAKE 1 CAPSULE BY MOUTH DAILY. HOLD IF TOP BLOOD PRESSURE NUMBER LESS THAN 100 MMHG OR HEART RATE LESS THAN 60 PULSE, Disp: 90 capsule, Rfl: 1   metFORMIN (GLUCOPHAGE) 500 MG tablet, 1 tab with dinner., Disp: 90 tablet, Rfl: 0   Multiple Vitamins-Minerals (PRESERVISION AREDS 2) CAPS, Take 1 capsule by mouth 2 (two) times daily. , Disp: , Rfl:    olmesartan (BENICAR) 20 MG tablet, TAKE 1 TABLET BY MOUTH EVERY DAY, Disp: 90 tablet, Rfl: 0   potassium citrate (UROCIT-K) 10 MEQ (1080 MG) SR tablet, Take 10 mEq by mouth 2 (two) times daily., Disp: , Rfl:    simvastatin (ZOCOR) 20 MG tablet, Take 1 tablet (20 mg total) by mouth every evening., Disp: 90 tablet, Rfl: 3

## 2021-09-20 ENCOUNTER — Ambulatory Visit (INDEPENDENT_AMBULATORY_CARE_PROVIDER_SITE_OTHER): Payer: Medicare Other | Admitting: Pulmonary Disease

## 2021-09-20 ENCOUNTER — Encounter: Payer: Self-pay | Admitting: Pulmonary Disease

## 2021-09-20 VITALS — BP 124/76 | HR 84 | Ht 61.0 in | Wt 188.2 lb

## 2021-09-20 DIAGNOSIS — I441 Atrioventricular block, second degree: Secondary | ICD-10-CM | POA: Diagnosis not present

## 2021-09-20 DIAGNOSIS — I2729 Other secondary pulmonary hypertension: Secondary | ICD-10-CM | POA: Diagnosis not present

## 2021-09-20 DIAGNOSIS — E669 Obesity, unspecified: Secondary | ICD-10-CM

## 2021-09-20 DIAGNOSIS — R400 Somnolence: Secondary | ICD-10-CM

## 2021-09-20 DIAGNOSIS — R0609 Other forms of dyspnea: Secondary | ICD-10-CM

## 2021-09-20 MED ORDER — ALBUTEROL SULFATE HFA 108 (90 BASE) MCG/ACT IN AERS: 2.0000 | INHALATION_SPRAY | Freq: Four times a day (QID) | RESPIRATORY_TRACT | 6 refills | Status: DC | PRN

## 2021-09-20 MED ORDER — FLUTICASONE FUROATE-VILANTEROL 100-25 MCG/ACT IN AEPB: 1.0000 | INHALATION_SPRAY | Freq: Every day | RESPIRATORY_TRACT | 0 refills | Status: DC

## 2021-09-20 NOTE — Patient Instructions (Signed)
For your shortness of breath with abnormal lung function test: As discussed today in clinic I think you do have asthma but I need to get a lung function test to look for other causes of lung problems. We will obtain a full lung function test Take Breo 101 puff daily no matter how you feel, we will give you a sample of that today Use albuterol 2 puffs every 4 hours as needed for shortness of breath or wheezing  For pulmonary hypertension seen on the echocardiogram at your cardiology clinic Because you have fatigue associated with this and they said your O2 saturation was dropping we will check a sleep study  Follow-up in 2 to 4 weeks with either an APP or me and we will go over the results of the lung function test

## 2021-09-22 ENCOUNTER — Telehealth: Payer: Self-pay

## 2021-09-27 DIAGNOSIS — I10 Essential (primary) hypertension: Secondary | ICD-10-CM | POA: Diagnosis not present

## 2021-09-28 ENCOUNTER — Telehealth: Payer: Self-pay | Admitting: Pulmonary Disease

## 2021-09-29 ENCOUNTER — Encounter (INDEPENDENT_AMBULATORY_CARE_PROVIDER_SITE_OTHER): Payer: Self-pay

## 2021-09-29 NOTE — Telephone Encounter (Signed)
Follow-up in 2 to 4 weeks with either an APP or me and we will go over the results of the lung function test  Please schedule pt 1hr PFT and follow up after with either APP or Dr. Lake Bells.

## 2021-09-29 NOTE — Telephone Encounter (Signed)
[facility] to schedule PFT and appointment. [place]

## 2021-09-30 ENCOUNTER — Other Ambulatory Visit: Payer: Self-pay | Admitting: Cardiology

## 2021-09-30 DIAGNOSIS — I10 Essential (primary) hypertension: Secondary | ICD-10-CM

## 2021-09-30 NOTE — Telephone Encounter (Signed)
Patient states needs RX  for Breo.[facility] [place] Patient phone number is [phone].

## 2021-09-30 NOTE — Telephone Encounter (Signed)
I called the patient and she wants to hold off on getting a refill at this time as she is doing well and has no breathing concerns. She will call back if she starts having any breathing issues. Nothing further needed.

## 2021-10-12 ENCOUNTER — Ambulatory Visit (INDEPENDENT_AMBULATORY_CARE_PROVIDER_SITE_OTHER): Payer: Medicare Other | Admitting: Adult Health

## 2021-10-12 ENCOUNTER — Encounter (INDEPENDENT_AMBULATORY_CARE_PROVIDER_SITE_OTHER): Payer: Self-pay | Admitting: Adult Health

## 2021-10-12 VITALS — BP 134/63 | HR 69 | Temp 97.9°F | Ht 61.0 in | Wt 182.0 lb

## 2021-10-12 DIAGNOSIS — E669 Obesity, unspecified: Secondary | ICD-10-CM

## 2021-10-12 DIAGNOSIS — R0602 Shortness of breath: Secondary | ICD-10-CM | POA: Diagnosis not present

## 2021-10-12 DIAGNOSIS — Z6834 Body mass index (BMI) 34.0-34.9, adult: Secondary | ICD-10-CM | POA: Diagnosis not present

## 2021-10-15 NOTE — Progress Notes (Unsigned)
Chief Complaint:   OBESITY Margaret Reese is here to discuss her progress with her obesity treatment plan along with follow-up of her obesity related diagnoses. Margaret Reese is on the Category 1 Plan and states she is following her eating plan approximately 33-1/3% of the time. Margaret Reese states she is doing low impact aerobics 30 minutes 6 times per week.  Today's visit was #: 106 Starting weight: 212 lbs Starting date: 03/26/2019 Today's weight: 182 lbs Today's date: 10/12/2021 Total lbs lost to date: 30 lbs Total lbs lost since last in-office visit: +3 lbs  Interim History: She is consistently following breakfast only. Lunch and dinner eating out to "MANY APPOINTMENTS".  Of Note:  Bioimpedance reflects water weight 09/08/2021, 72 lbs, 10/12/21, 71.2 lbs.  Subjective:   1. SOB (shortness of breath) 09/13/21 cards office visit notes *** She never used Bumex.  She denies cough, dyspnea at rest SpO2 sat today 96%. RA provided by pulmonologist.  She completed 10 day trial of Breo inhaler.   Assessment/Plan:   1. SOB (shortness of breath) Follow up with cards/pulmonologist as directed.   2. Obesity, current BMI 34.4 Margaret Reese is currently in the action stage of change. As such, her goal is to continue with weight loss efforts. She has agreed to the Category 1 Plan, lunch 300-400 calories + 25 grams of protein.  At dinner add 300-400 calories and 35 grams of protein.    Exercise goals:  As is.   Behavioral modification strategies: increasing lean protein intake, decreasing simple carbohydrates, no skipping meals, meal planning and cooking strategies, keeping healthy foods in the home, and planning for success.  Margaret Reese has agreed to follow-up with our clinic in 4 weeks. She was informed of the importance of frequent follow-up visits to maximize her success with intensive lifestyle modifications for her multiple health conditions.   Objective:   Blood pressure 134/63, pulse 69, temperature 97.9 F  (36.6 C), height '5\' 1"'$  (1.549 m), weight 182 lb (82.6 kg), SpO2 96 %. Body mass index is 34.39 kg/m.  General: Cooperative, alert, well developed, in no acute distress. HEENT: Conjunctivae and lids unremarkable. Cardiovascular: Regular rhythm.  Lungs: Normal work of breathing. Neurologic: No focal deficits.   Lab Results  Component Value Date   CREATININE 0.82 08/18/2021   BUN 15 08/18/2021   NA 143 08/18/2021   K 4.6 08/18/2021   CL 102 08/18/2021   CO2 26 08/18/2021   Lab Results  Component Value Date   ALT 11 06/22/2021   AST 20 06/22/2021   ALKPHOS 113 06/22/2021   BILITOT 0.8 06/22/2021   Lab Results  Component Value Date   HGBA1C 5.6 03/26/2019   Lab Results  Component Value Date   INSULIN 8.1 03/26/2019   Lab Results  Component Value Date   TSH 3.360 03/26/2019   Lab Results  Component Value Date   CHOL 147 06/22/2021   HDL 67 06/22/2021   LDLCALC 65 06/22/2021   TRIG 77 06/22/2021   CHOLHDL 2.2 06/22/2021   Lab Results  Component Value Date   VD25OH 31.6 03/26/2019   Lab Results  Component Value Date   WBC 4.9 06/22/2021   HGB 13.4 06/22/2021   HCT 40.7 06/22/2021   MCV 88 06/22/2021   PLT 206 06/22/2021   Lab Results  Component Value Date   IRON 64 03/26/2019   TIBC 301 03/26/2019   FERRITIN 260 (H) 03/26/2019    Obesity Behavioral Intervention:   Approximately 15 minutes were spent on  the discussion below.  ASK: We discussed the diagnosis of obesity with Margaret Reese today and Margaret Reese agreed to give [name] permission to discuss obesity behavioral modification therapy today.  ASSESS: Margaret Reese has the diagnosis of obesity and her BMI today is 34.4. Margaret Reese is in the action stage of change.   ADVISE: Margaret Reese was educated on the multiple health risks of obesity as well as the benefit of weight loss to improve her health. She was advised of the need for long term treatment and the importance of lifestyle modifications to improve her current health and to  decrease her risk of future health problems.  AGREE: Multiple dietary modification options and treatment options were discussed and Margaret Reese agreed to follow the recommendations documented in the above note.  ARRANGE: Margaret Reese was educated on the importance of frequent visits to treat obesity as outlined per CMS and USPSTF guidelines and agreed to schedule her next follow up appointment today.  Attestation Statements:   Reviewed by clinician on day of visit: allergies, medications, problem list, medical history, surgical history, family history, social history, and previous encounter notes.  Time spent on visit including pre-visit chart review and post-visit care and charting was 30 minutes.   I, Davy Pique, RMA, am acting as Location manager for [name], NP.  I have reviewed the above documentation for accuracy and completeness, and I agree with the above. -  ***

## 2021-10-18 ENCOUNTER — Ambulatory Visit: Payer: Medicare Other | Admitting: Cardiology

## 2021-10-27 ENCOUNTER — Encounter: Payer: Self-pay | Admitting: Internal Medicine

## 2021-10-27 DIAGNOSIS — I10 Essential (primary) hypertension: Secondary | ICD-10-CM | POA: Diagnosis not present

## 2021-11-09 ENCOUNTER — Ambulatory Visit (INDEPENDENT_AMBULATORY_CARE_PROVIDER_SITE_OTHER): Payer: Medicare Other | Admitting: Adult Health

## 2021-11-09 ENCOUNTER — Encounter (INDEPENDENT_AMBULATORY_CARE_PROVIDER_SITE_OTHER): Payer: Self-pay | Admitting: Adult Health

## 2021-11-09 VITALS — BP 144/80 | HR 75 | Temp 97.6°F | Ht 61.0 in | Wt 178.0 lb

## 2021-11-09 DIAGNOSIS — Z6833 Body mass index (BMI) 33.0-33.9, adult: Secondary | ICD-10-CM

## 2021-11-09 DIAGNOSIS — R632 Polyphagia: Secondary | ICD-10-CM | POA: Diagnosis not present

## 2021-11-09 DIAGNOSIS — E669 Obesity, unspecified: Secondary | ICD-10-CM

## 2021-11-09 DIAGNOSIS — I10 Essential (primary) hypertension: Secondary | ICD-10-CM | POA: Diagnosis not present

## 2021-11-10 NOTE — Progress Notes (Unsigned)
Chief Complaint:   OBESITY Margaret Reese is here to discuss her progress with her obesity treatment plan along with follow-up of her obesity related diagnoses. Margaret Reese is on the Category 1 Plan and states she is following her eating plan approximately 50% of the time. [name] states she is taking low impact aerobics classes 40 minutes 7 times per week.  Today's visit was #: 92 Starting weight: 212 lbs Starting date: 03/26/2019 Today's weight: 178 lbs Today's date: 11/09/2021 Total lbs lost to date: 34 lbs Total lbs lost since last in-office visit: 4 lbs  Interim History: she really enjoys roasts.  Bioimpedance results reviewed with patient:   Muscle mass: +0.2 lb Adipose mass:  -4.2 Water weight:  -3.6 lbs  Subjective:   1. Polyphagia She endorses reduction of sugar cravings with evening metformin 500 mg.  She denies GI upset.   2. Essential hypertension Her home blood pressure readings are electronically transmitted to her cardiologist Dr Terri Skains.  She is on Cardizem 120 mg daily and Benicar 20 mg daily.   Assessment/Plan:   1. Polyphagia ***  2. Essential hypertension Continue antihypertensive therapy per cardiology.   3. Obesity, current BMI 33.7 Handout:  Halloween Tip Sheet.  Check fasting labs at next office visit.   Margaret Reese is currently in the action stage of change. As such, her goal is to continue with weight loss efforts. She has agreed to the Category 1 Plan.   Exercise goals:  As is.   Behavioral modification strategies: increasing lean protein intake, decreasing simple carbohydrates, meal planning and cooking strategies, keeping healthy foods in the home, and planning for success.  Margaret Reese has agreed to follow-up with our clinic in 4 weeks. She was informed of the importance of frequent follow-up visits to maximize her success with intensive lifestyle modifications for her multiple health conditions.   Objective:   Blood pressure (!) 144/80, pulse 75, temperature  97.6 F (36.4 C), height '5\' 1"'$  (1.549 m), weight 178 lb (80.7 kg), SpO2 98 %. Body mass index is 33.63 kg/m.  General: Cooperative, alert, well developed, in no acute distress. HEENT: Conjunctivae and lids unremarkable. Cardiovascular: Regular rhythm.  Lungs: Normal work of breathing. Neurologic: No focal deficits.   Lab Results  Component Value Date   CREATININE 0.82 08/18/2021   BUN 15 08/18/2021   NA 143 08/18/2021   K 4.6 08/18/2021   CL 102 08/18/2021   CO2 26 08/18/2021   Lab Results  Component Value Date   ALT 11 06/22/2021   AST 20 06/22/2021   ALKPHOS 113 06/22/2021   BILITOT 0.8 06/22/2021   Lab Results  Component Value Date   HGBA1C 5.6 03/26/2019   Lab Results  Component Value Date   INSULIN 8.1 03/26/2019   Lab Results  Component Value Date   TSH 3.360 03/26/2019   Lab Results  Component Value Date   CHOL 147 06/22/2021   HDL 67 06/22/2021   LDLCALC 65 06/22/2021   TRIG 77 06/22/2021   CHOLHDL 2.2 06/22/2021   Lab Results  Component Value Date   VD25OH 31.6 03/26/2019   Lab Results  Component Value Date   WBC 4.9 06/22/2021   HGB 13.4 06/22/2021   HCT 40.7 06/22/2021   MCV 88 06/22/2021   PLT 206 06/22/2021   Lab Results  Component Value Date   IRON 64 03/26/2019   TIBC 301 03/26/2019   FERRITIN 260 (H) 03/26/2019    Obesity Behavioral Intervention:   Approximately 15 minutes were  spent on the discussion below.  ASK: We discussed the diagnosis of obesity with Margaret Reese today and [name] agreed to give Margaret Reese permission to discuss obesity behavioral modification therapy today.  ASSESS: Margaret Reese has the diagnosis of obesity and her BMI today is 33.7. Margaret Reese is in the action stage of change.   ADVISE: Margaret Reese was educated on the multiple health risks of obesity as well as the benefit of weight loss to improve her health. She was advised of the need for long term treatment and the importance of lifestyle modifications to improve her current health  and to decrease her risk of future health problems.  AGREE: Multiple dietary modification options and treatment options were discussed and Margaret Reese agreed to follow the recommendations documented in the above note.  ARRANGE: Margaret Reese was educated on the importance of frequent visits to treat obesity as outlined per CMS and USPSTF guidelines and agreed to schedule her next follow up appointment today.  Attestation Statements:   Reviewed by clinician on day of visit: allergies, medications, problem list, medical history, surgical history, family history, social history, and previous encounter notes.  I, Davy Pique, RMA, am acting as Location manager for Margaret Marble, NP.  I have reviewed the above documentation for accuracy and completeness, and I agree with the above. -  ***

## 2021-11-17 ENCOUNTER — Ambulatory Visit: Payer: Medicare Other

## 2021-11-17 DIAGNOSIS — R400 Somnolence: Secondary | ICD-10-CM

## 2021-11-19 ENCOUNTER — Ambulatory Visit (INDEPENDENT_AMBULATORY_CARE_PROVIDER_SITE_OTHER): Payer: Medicare Other | Admitting: Adult Health

## 2021-11-19 ENCOUNTER — Ambulatory Visit (INDEPENDENT_AMBULATORY_CARE_PROVIDER_SITE_OTHER): Payer: Medicare Other | Admitting: Pulmonary Disease

## 2021-11-19 ENCOUNTER — Encounter: Payer: Self-pay | Admitting: Adult Health

## 2021-11-19 DIAGNOSIS — I5032 Chronic diastolic (congestive) heart failure: Secondary | ICD-10-CM

## 2021-11-19 DIAGNOSIS — R0609 Other forms of dyspnea: Secondary | ICD-10-CM | POA: Diagnosis not present

## 2021-11-19 DIAGNOSIS — I272 Pulmonary hypertension, unspecified: Secondary | ICD-10-CM | POA: Diagnosis not present

## 2021-11-19 DIAGNOSIS — R0602 Shortness of breath: Secondary | ICD-10-CM

## 2021-11-19 DIAGNOSIS — J45909 Unspecified asthma, uncomplicated: Secondary | ICD-10-CM | POA: Insufficient documentation

## 2021-11-19 DIAGNOSIS — J4530 Mild persistent asthma, uncomplicated: Secondary | ICD-10-CM | POA: Diagnosis not present

## 2021-11-19 DIAGNOSIS — J301 Allergic rhinitis due to pollen: Secondary | ICD-10-CM

## 2021-11-19 LAB — PULMONARY FUNCTION TEST
DL/VA % pred: 133 %
DL/VA: 5.62 ml/min/mmHg/L
DLCO cor % pred: 131 %
DLCO cor: 21.63 ml/min/mmHg
DLCO unc % pred: 131 %
DLCO unc: 21.63 ml/min/mmHg
FEF 25-75 Post: 1.11 L/sec
FEF 25-75 Pre: 0.62 L/sec
FEF2575-%Change-Post: 78 %
FEF2575-%Pred-Post: 82 %
FEF2575-%Pred-Pre: 46 %
FEV1-%Change-Post: 21 %
FEV1-%Pred-Post: 73 %
FEV1-%Pred-Pre: 60 %
FEV1-Post: 1.23 L
FEV1-Pre: 1.01 L
FEV1FVC-%Change-Post: 3 %
FEV1FVC-%Pred-Pre: 89 %
FEV6-%Change-Post: 16 %
FEV6-%Pred-Post: 83 %
FEV6-%Pred-Pre: 71 %
FEV6-Post: 1.77 L
FEV6-Pre: 1.52 L
FEV6FVC-%Pred-Post: 105 %
FEV6FVC-%Pred-Pre: 105 %
FVC-%Change-Post: 16 %
FVC-%Pred-Post: 78 %
FVC-%Pred-Pre: 67 %
FVC-Post: 1.77 L
FVC-Pre: 1.52 L
Post FEV1/FVC ratio: 69 %
Post FEV6/FVC ratio: 100 %
Pre FEV1/FVC ratio: 67 %
Pre FEV6/FVC Ratio: 100 %
RV % pred: 151 %
RV: 3.23 L
TLC % pred: 114 %
TLC: 5.11 L

## 2021-11-19 MED ORDER — FLUTICASONE FUROATE-VILANTEROL 100-25 MCG/ACT IN AEPB: 1.0000 | INHALATION_SPRAY | Freq: Every day | RESPIRATORY_TRACT | 5 refills | Status: DC

## 2021-11-19 NOTE — Patient Instructions (Signed)
Full PFT performed today. °

## 2021-11-19 NOTE — Assessment & Plan Note (Signed)
Echo suggestive of pulmonary hypertension.  Sleep study results are pending.  We will follow-up once available

## 2021-11-19 NOTE — Assessment & Plan Note (Signed)
Appears euvolemic on exam without evidence of volume overload.  Continue follow-up with cardiology

## 2021-11-19 NOTE — Progress Notes (Signed)
Full PFT performed today. °

## 2021-11-19 NOTE — Assessment & Plan Note (Signed)
Moderate airflow obstruction with significant reversibility on PFTs consistent with underlying asthma process.  Long discussion with patient regarding her PFT results and treatment plan for asthma along with asthma action plan. Patient is to begin Breo 134mg 1 puff daily.  Albuterol as needed Had an trigger prevention for chronic rhinitis  Plan  Patient Instructions  Restart BREO 1 puff daily , rinse after use (call back if you want 90 day sent)  Saline nasal spray rinse Twice daily   Claritin '5mg'$  At bedtime As needed   Activity as tolerated  We will call with sleep study results when available.  Follow up with Dr. MLake Bellsor Raschelle Wisenbaker NP in 6 weeks and As needed   Please contact office for sooner follow up if symptoms do not improve or worsen or seek emergency care

## 2021-11-19 NOTE — Assessment & Plan Note (Signed)
Shortness of breath may be multifactorial in nature.  Recent echo suggestive of pulmonary hypertension.  Sleep study is pending.  PFTs show moderate airflow obstruction consistent with asthma.  We will maximize asthma maintenance regimen.  Recent chest x-ray without acute process.  Lab work with CBC and BMP unrevealing. On return  see if patient will do walk test in office to see if she has significant desaturations with ambulation.  Plan  . Patient Instructions  Restart BREO 1 puff daily , rinse after use (call back if you want 90 day sent)  Saline nasal spray rinse Twice daily   Claritin '5mg'$  At bedtime As needed   Activity as tolerated  We will call with sleep study results when available.  Follow up with Dr. Lake Bells or Kaysea Raya NP in 6 weeks and As needed   Please contact office for sooner follow up if symptoms do not improve or worsen or seek emergency care

## 2021-11-19 NOTE — Progress Notes (Signed)
$'@Patient'n$  ID: Margaret Reese, female    DOB: 05-Apr-1945, 76 y.o.   MRN: [id number]  Chief Complaint  Patient presents with   Follow-up    Referring provider: Denita Lung, MD  HPI: 77 year old female seen for pulmonary consult September 20, 2021 for shortness of breath with activity Medical history significant for hypertension, AV block status post pacemaker  TEST/EVENTS :  08/22/2019: LVEF 55 to 60%, moderate LVH, grade 1 diastolic impairment, elevated LAP, pacemaker/ICD lead noted in the RV, mild MR, moderate TR, mild pulmonary hypertension with RVSP 39 mmHg.   09/01/2021: Left ventricle cavity is normal in size. Moderate concentric hypertrophy of the left ventricle. Abnormal septal wall motion due to left bundle branch block. Normal LV systolic function with EF 57%. Indeterminate diastolic filling pattern.  Mild to moderate tricuspid regurgitation. RVSP measures 41 mmHg. No significant change compared to previous study on 08/22/2019.   Stress Testing:  January 23, 2015:Myocardial perfusion imaging is normal. Overall left ventricular systolic function was normal without regional wall motion abnormalities. The left ventricular ejection fraction was 78%.  This is a normal/low risk study.   Heart Catheterization: 04/2016 Dr. Dixie Dials: Normal coronaries  Jun 22, 2021 absolute eosinophil count 200    11/19/2021 Follow up: Dyspnea  Patient returns for a 42-monthfollow-up.  Patient was seen last visit for pulmonary consult for shortness of breath with activity this been going on for several years but seems to be getting slightly worse this past year.  Patient says she is very active tries to exercise most days.  Goes to a low impact aerobic class II days a week.  Typically does well with her aerobic class but has trouble walking any amount of distance on a flat surface.  She was seen by cardiology with recent 2D echo showing preserved EF moderate LVH, mild to moderate tricuspid valve  regurg and RSVP SP at 41 mmHg.  Patient says she has had exercise-induced asthma in the past but used albuterol years ago.  She says she occasionally gets dry cough and wheezing.  Patient was set up for pulmonary function testing that was done today that shows moderate airflow obstruction with significant reversibility FEV1 at 73%, ratio 69, FVC 78%, positive bronchodilator change (21% change) and DLCO at 131%.  Last visit patient was started on Breo but unfortunately only took the sample and has not been on since we last saw her.  We discussed her pulmonary function testing today in detail.  Patient denies any exertional chest pain, hemoptysis, calf pain or edema.  Says she just gets short of breath with walking.  Does occasionally check her oxygen levels with walking and has seen it go down into the 80s but for the most part stays in the 90s.  We discussed a walk test today in the office but she declines. She recently had a home sleep study earlier this week.  Results are still pending.  Patient has been told that she had some low oxygen levels previously while sleeping and also with echo showing some possible pulmonary hypertension.  There is concern for underlying sleep apnea.  Patient complains of postnasal drainage and chronic intermittent nasal stuffiness..   Allergies  Allergen Reactions   Toprol Xl [Metoprolol Tartrate]     Depressive mood.     Immunization History  Administered Date(s) Administered   Influenza Split 11/07/2011, 12/01/2012   Influenza Whole 01/11/2006, 11/28/2008, 11/30/2010   Influenza-Unspecified 12/08/2014, 12/07/2015, 11/21/2016, 11/21/2017, 11/16/2018, 11/03/2020   PFIZER(Purple Top)SARS-COV-2  Vaccination 04/07/2019, 04/30/2019, 10/24/2019   Pfizer Covid-19 Vaccine Bivalent Booster 63yr & up 12/25/2020   Pneumococcal Conjugate-13 09/15/2015   Pneumococcal Polysaccharide-23 09/07/2004, 09/11/2012   Td 09/17/2004   Tdap 06/01/2015   Zoster, Live 12/16/2009     Past Medical History:  Diagnosis Date   Allergy    Anxiety    Arthritis    Asthma    Complication of anesthesia    pt states has difficulty awakening; also has increased sinus drainage   Constipation    Edema, lower extremity    Encounter for interrogation of cardiac pacemaker 09/05/2018   Falls    GERD (gastroesophageal reflux disease)    H/O back injury    History of bronchitis    History of colon polyps    Hypertension    Imbalance    Kidney cysts    pt states not sure which kidney does see kidney specialist yearly pt states every thing okay currently    [place] in [place]   Mobitz type II atrioventricular block 11/08/2016   Multiple gastric ulcers    Obesity    Pacemaker S/P [employer] MRI model [id number]/30/2018   Renal stone    Retinal vein occlusion    Shortness of breath    [place] 12/16[place]   Tingling    left arm    Trace cataracts    Urinary frequency    Vitamin D deficiency     Tobacco History: Social History   Tobacco Use  Smoking Status Never  Smokeless Tobacco Never   Counseling given: Not Answered   Outpatient Medications Prior to Visit  Medication Sig Dispense Refill   acetaminophen (TYLENOL) 325 MG tablet Take 1-2 tablets (325-650 mg total) by mouth every 4 (four) hours as needed for mild pain.     albuterol (VENTOLIN HFA) 108 (90 Base) MCG/ACT inhaler Inhale 2 puffs into the lungs every 6 (six) hours as needed for wheezing or shortness of breath. (Patient not taking: Reported on 11/19/2021) 8 g 6   aspirin 81 MG tablet Take 81 mg by mouth daily.     diltiazem (CARDIZEM CD) 120 MG 24 hr capsule TAKE 1 CAPSULE BY MOUTH DAILY. HOLD IF TOP BLOOD PRESSURE NUMBER LESS THAN 100 MMHG OR HEART RATE LESS THAN 60 PULSE 90 capsule 1   metFORMIN (GLUCOPHAGE) 500 MG tablet 1 tab with dinner. 90 tablet 0   Multiple Vitamins-Minerals (PRESERVISION AREDS 2) CAPS Take 1 capsule by mouth 2 (two)  times daily.      olmesartan (BENICAR) 20 MG tablet TAKE 1 TABLET BY MOUTH EVERY DAY 90 tablet 0   potassium citrate (UROCIT-K) 10 MEQ (1080 MG) SR tablet Take 10 mEq by mouth 2 (two) times daily.     simvastatin (ZOCOR) 20 MG tablet Take 1 tablet (20 mg total) by mouth every evening. 90 tablet 3   No facility-administered medications prior to visit.     Review of Systems:   Constitutional:   No  weight loss, night sweats,  Fevers, chills, fatigue, or  lassitude.  HEENT:   No headaches,  Difficulty swallowing,  Tooth/dental problems, or  Sore throat,                No sneezing, itching, ear ache, nasal congestion, post nasal drip,   CV:  No chest pain,  Orthopnea, PND, swelling in lower extremities, anasarca, dizziness, palpitations, syncope.   GI  No heartburn, indigestion, abdominal  pain, nausea, vomiting, diarrhea, change in bowel habits, loss of appetite, bloody stools.   Resp: No shortness of breath with exertion or at rest.  No excess mucus, no productive cough,  No non-productive cough,  No coughing up of blood.  No change in color of mucus.  No wheezing.  No chest wall deformity  Skin: no rash or lesions.  GU: no dysuria, change in color of urine, no urgency or frequency.  No flank pain, no hematuria   MS:  No joint pain or swelling.  No decreased range of motion.  No back pain.    Physical Exam  BP (!) 140/70 (BP Location: Left Arm)   Pulse (!) 101   Ht 5' (1.524 m)   Wt 184 lb (83.5 kg)   SpO2 97%   BMI 35.94 kg/m   GEN: A/Ox3; pleasant , NAD, well nourished    HEENT:  [place]/AT,   NOSE-clear, THROAT-clear, no lesions, no postnasal drip or exudate noted.  Class III MP airway  NECK:  Supple w/ fair ROM; no JVD; normal carotid impulses w/o bruits; no thyromegaly or nodules palpated; no lymphadenopathy.    RESP  Clear  P & A; w/o, wheezes/ rales/ or rhonchi. no accessory muscle use, no dullness to percussion  CARD:  RRR, no m/r/g, no peripheral edema, pulses intact,  no cyanosis or clubbing. No calf pain .   GI:   Soft & nt; nml bowel sounds; no organomegaly or masses detected.   Musco: Warm bil, no deformities or joint swelling noted.   Neuro: alert, no focal deficits noted.    Skin: Warm, no lesions or rashes    Lab Results:  CBC    Component Value Date/Time   WBC 4.9 06/22/2021 0956   WBC 6.7 11/08/2016 1035   RBC 4.65 06/22/2021 0956   RBC 4.46 11/08/2016 1035   HGB 13.4 06/22/2021 0956   HCT 40.7 06/22/2021 0956   PLT 206 06/22/2021 0956   MCV 88 06/22/2021 0956   MCH 28.8 06/22/2021 0956   MCH 28.5 11/08/2016 1035   MCHC 32.9 06/22/2021 0956   MCHC 32.6 11/08/2016 1035   RDW 13.1 06/22/2021 0956   LYMPHSABS 1.9 06/22/2021 0956   MONOABS 330 09/15/2015 0914   EOSABS 0.2 06/22/2021 0956   BASOSABS 0.0 06/22/2021 0956    BMET    Component Value Date/Time   NA 143 08/18/2021 1055   K 4.6 08/18/2021 1055   CL 102 08/18/2021 1055   CO2 26 08/18/2021 1055   GLUCOSE 90 08/18/2021 1055   GLUCOSE 90 11/08/2016 1035   BUN 15 08/18/2021 1055   CREATININE 0.82 08/18/2021 1055   CREATININE 0.96 (H) 11/08/2016 1035   CALCIUM 9.9 08/19/2021 1340   GFRNONAA 68 02/26/2020 1045   GFRAA 79 02/26/2020 1045    BNP    Component Value Date/Time   BNP 33.9 08/18/2021 1055    ProBNP    Component Value Date/Time   PROBNP 150 08/18/2021 1055    Imaging: No results found.       Latest Ref Rng & Units 11/19/2021    8:27 AM  PFT Results  FVC-Pre L 1.52  P  FVC-Predicted Pre % 67  P  FVC-Post L 1.77  P  FVC-Predicted Post % 78  P  Pre FEV1/FVC % % 67  P  Post FEV1/FCV % % 69  P  FEV1-Pre L 1.01  P  FEV1-Predicted Pre % 60  P  FEV1-Post L 1.23  P  DLCO uncorrected ml/min/mmHg 21.63  P  DLCO UNC% % 131  P  DLCO corrected ml/min/mmHg 21.63  P  DLCO COR %Predicted % 131  P  DLVA Predicted % 133  P  TLC L 5.11  P  TLC % Predicted % 114  P  RV % Predicted % 151  P    P Preliminary result    No results found for:  "NITRICOXIDE"      Assessment & Plan:   Asthma Moderate airflow obstruction with significant reversibility on PFTs consistent with underlying asthma process.  Long discussion with patient regarding her PFT results and treatment plan for asthma along with asthma action plan. Patient is to begin Breo 156mg 1 puff daily.  Albuterol as needed Had an trigger prevention for chronic rhinitis  Plan  Patient Instructions  Restart BREO 1 puff daily , rinse after use (call back if you want 90 day sent)  Saline nasal spray rinse Twice daily   Claritin '5mg'$  At bedtime As needed   Activity as tolerated  We will call with sleep study results when available.  Follow up with Dr. MLake Bellsor Tarissa Kerin NP in 6 weeks and As needed   Please contact office for sooner follow up if symptoms do not improve or worsen or seek emergency care        Allergic rhinitis due to pollen Add Claritin 5 mg at bedtime as needed saline nasal rinses.  Plan  Patient Instructions  Restart BREO 1 puff daily , rinse after use (call back if you want 90 day sent)  Saline nasal spray rinse Twice daily   Claritin '5mg'$  At bedtime As needed   Activity as tolerated  We will call with sleep study results when available.  Follow up with Dr. MLake Bellsor Julis Haubner NP in 6 weeks and As needed   Please contact office for sooner follow up if symptoms do not improve or worsen or seek emergency care        SOB (shortness of breath) Shortness of breath may be multifactorial in nature.  Recent echo suggestive of pulmonary hypertension.  Sleep study is pending.  PFTs show moderate airflow obstruction consistent with asthma.  We will maximize asthma maintenance regimen.  Recent chest x-ray without acute process.  Lab work with CBC and BMP unrevealing. On return  see if patient will do walk test in office to see if she has significant desaturations with ambulation.  Plan  . Patient Instructions  Restart BREO 1 puff daily , rinse after  use (call back if you want 90 day sent)  Saline nasal spray rinse Twice daily   Claritin '5mg'$  At bedtime As needed   Activity as tolerated  We will call with sleep study results when available.  Follow up with Dr. MLake Bellsor Shana Zavaleta NP in 6 weeks and As needed   Please contact office for sooner follow up if symptoms do not improve or worsen or seek emergency care        Chronic diastolic (congestive) heart failure ([place] Appears euvolemic on exam without evidence of volume overload.  Continue follow-up with cardiology  Pulmonary hypertension ([place] Echo suggestive of pulmonary hypertension.  Sleep study results are pending.  We will follow-up once available     TRexene Edison NP 11/19/2021

## 2021-11-19 NOTE — Assessment & Plan Note (Signed)
Add Claritin 5 mg at bedtime as needed saline nasal rinses.  Plan  Patient Instructions  Restart BREO 1 puff daily , rinse after use (call back if you want 90 day sent)  Saline nasal spray rinse Twice daily   Claritin '5mg'$  At bedtime As needed   Activity as tolerated  We will call with sleep study results when available.  Follow up with Dr. Lake Bells or Kyani Simkin NP in 6 weeks and As needed   Please contact office for sooner follow up if symptoms do not improve or worsen or seek emergency care

## 2021-11-19 NOTE — Patient Instructions (Addendum)
Restart BREO 1 puff daily , rinse after use (call back if you want 90 day sent)  Saline nasal spray rinse Twice daily   Claritin '5mg'$  At bedtime As needed   Activity as tolerated  We will call with sleep study results when available.  Follow up with Dr. Lake Bells or Laycee Fitzsimmons NP in 6 weeks and As needed   Please contact office for sooner follow up if symptoms do not improve or worsen or seek emergency care

## 2021-11-23 NOTE — Progress Notes (Signed)
Reviewed, agree 

## 2021-11-25 ENCOUNTER — Ambulatory Visit: Payer: Medicare Other

## 2021-11-26 DIAGNOSIS — I10 Essential (primary) hypertension: Secondary | ICD-10-CM | POA: Diagnosis not present

## 2021-11-30 ENCOUNTER — Encounter: Payer: Self-pay | Admitting: Internal Medicine

## 2021-12-05 ENCOUNTER — Other Ambulatory Visit (INDEPENDENT_AMBULATORY_CARE_PROVIDER_SITE_OTHER): Payer: Self-pay | Admitting: Adult Health

## 2021-12-05 DIAGNOSIS — R632 Polyphagia: Secondary | ICD-10-CM

## 2021-12-06 DIAGNOSIS — Z45018 Encounter for adjustment and management of other part of cardiac pacemaker: Secondary | ICD-10-CM | POA: Diagnosis not present

## 2021-12-06 DIAGNOSIS — I441 Atrioventricular block, second degree: Secondary | ICD-10-CM | POA: Diagnosis not present

## 2021-12-13 ENCOUNTER — Encounter: Payer: Self-pay | Admitting: Internal Medicine

## 2021-12-14 ENCOUNTER — Ambulatory Visit (INDEPENDENT_AMBULATORY_CARE_PROVIDER_SITE_OTHER): Payer: Medicare Other | Admitting: Adult Health

## 2021-12-14 ENCOUNTER — Other Ambulatory Visit: Payer: Self-pay | Admitting: Cardiology

## 2021-12-14 DIAGNOSIS — I10 Essential (primary) hypertension: Secondary | ICD-10-CM

## 2021-12-14 DIAGNOSIS — R0602 Shortness of breath: Secondary | ICD-10-CM

## 2021-12-24 ENCOUNTER — Ambulatory Visit: Payer: Medicare Other

## 2021-12-24 DIAGNOSIS — G4733 Obstructive sleep apnea (adult) (pediatric): Secondary | ICD-10-CM

## 2021-12-27 DIAGNOSIS — I10 Essential (primary) hypertension: Secondary | ICD-10-CM | POA: Diagnosis not present

## 2021-12-29 ENCOUNTER — Ambulatory Visit (INDEPENDENT_AMBULATORY_CARE_PROVIDER_SITE_OTHER): Payer: Medicare Other | Admitting: Family Medicine

## 2021-12-29 ENCOUNTER — Encounter (INDEPENDENT_AMBULATORY_CARE_PROVIDER_SITE_OTHER): Payer: Self-pay | Admitting: Family Medicine

## 2021-12-29 VITALS — BP 145/71 | HR 85 | Temp 97.5°F | Ht 61.0 in | Wt 183.0 lb

## 2021-12-29 DIAGNOSIS — R0609 Other forms of dyspnea: Secondary | ICD-10-CM | POA: Diagnosis not present

## 2021-12-29 DIAGNOSIS — I10 Essential (primary) hypertension: Secondary | ICD-10-CM

## 2021-12-29 DIAGNOSIS — E559 Vitamin D deficiency, unspecified: Secondary | ICD-10-CM | POA: Diagnosis not present

## 2021-12-29 DIAGNOSIS — E7800 Pure hypercholesterolemia, unspecified: Secondary | ICD-10-CM | POA: Diagnosis not present

## 2021-12-29 DIAGNOSIS — R739 Hyperglycemia, unspecified: Secondary | ICD-10-CM

## 2021-12-29 DIAGNOSIS — E669 Obesity, unspecified: Secondary | ICD-10-CM | POA: Diagnosis not present

## 2021-12-29 DIAGNOSIS — Z6835 Body mass index (BMI) 35.0-35.9, adult: Secondary | ICD-10-CM | POA: Diagnosis not present

## 2021-12-29 MED ORDER — METFORMIN HCL 500 MG PO TABS: ORAL_TABLET | ORAL | 0 refills | Status: DC

## 2021-12-30 ENCOUNTER — Other Ambulatory Visit: Payer: Self-pay | Admitting: Cardiology

## 2021-12-30 DIAGNOSIS — I10 Essential (primary) hypertension: Secondary | ICD-10-CM

## 2021-12-30 LAB — CBC WITH DIFFERENTIAL/PLATELET
Basophils Absolute: 0.1 10*3/uL (ref 0.0–0.2)
Basos: 1 %
EOS (ABSOLUTE): 0.1 10*3/uL (ref 0.0–0.4)
Eos: 3 %
Hematocrit: 41.8 % (ref 34.0–46.6)
Hemoglobin: 13.5 g/dL (ref 11.1–15.9)
Immature Grans (Abs): 0 10*3/uL (ref 0.0–0.1)
Immature Granulocytes: 0 %
Lymphocytes Absolute: 1.6 10*3/uL (ref 0.7–3.1)
Lymphs: 31 %
MCH: 29.3 pg (ref 26.6–33.0)
MCHC: 32.3 g/dL (ref 31.5–35.7)
MCV: 91 fL (ref 79–97)
Monocytes Absolute: 0.3 10*3/uL (ref 0.1–0.9)
Monocytes: 7 %
Neutrophils Absolute: 3.1 10*3/uL (ref 1.4–7.0)
Neutrophils: 58 %
Platelets: 221 10*3/uL (ref 150–450)
RBC: 4.61 x10E6/uL (ref 3.77–5.28)
RDW: 13.5 % (ref 11.7–15.4)
WBC: 5.3 10*3/uL (ref 3.4–10.8)

## 2021-12-30 LAB — CMP14+EGFR
ALT: 14 IU/L (ref 0–32)
AST: 24 IU/L (ref 0–40)
Albumin/Globulin Ratio: 2.3 — ABNORMAL HIGH (ref 1.2–2.2)
Albumin: 4.9 g/dL — ABNORMAL HIGH (ref 3.8–4.8)
Alkaline Phosphatase: 95 IU/L (ref 44–121)
BUN/Creatinine Ratio: 17 (ref 12–28)
BUN: 15 mg/dL (ref 8–27)
Bilirubin Total: 0.9 mg/dL (ref 0.0–1.2)
CO2: 27 mmol/L (ref 20–29)
Calcium: 10.4 mg/dL — ABNORMAL HIGH (ref 8.7–10.3)
Chloride: 103 mmol/L (ref 96–106)
Creatinine, Ser: 0.86 mg/dL (ref 0.57–1.00)
Globulin, Total: 2.1 g/dL (ref 1.5–4.5)
Glucose: 82 mg/dL (ref 70–99)
Potassium: 4.4 mmol/L (ref 3.5–5.2)
Sodium: 143 mmol/L (ref 134–144)
Total Protein: 7 g/dL (ref 6.0–8.5)
eGFR: 70 mL/min/{1.73_m2} (ref >59)

## 2021-12-30 LAB — LIPID PANEL WITH LDL/HDL RATIO
Cholesterol, Total: 167 mg/dL (ref 100–199)
HDL: 84 mg/dL (ref >39)
LDL Chol Calc (NIH): 72 mg/dL (ref 0–99)
LDL/HDL Ratio: 0.9 ratio (ref 0.0–3.2)
Triglycerides: 55 mg/dL (ref 0–149)
VLDL Cholesterol Cal: 11 mg/dL (ref 5–40)

## 2021-12-30 LAB — INSULIN, RANDOM: INSULIN: 6.2 u[IU]/mL (ref 2.6–24.9)

## 2021-12-30 LAB — TSH: TSH: 2.47 u[IU]/mL (ref 0.450–4.500)

## 2021-12-30 LAB — VITAMIN D 25 HYDROXY (VIT D DEFICIENCY, FRACTURES): Vit D, 25-Hydroxy: 27.5 ng/mL — ABNORMAL LOW (ref 30.0–100.0)

## 2021-12-30 LAB — HEMOGLOBIN A1C
Est. average glucose Bld gHb Est-mCnc: 108 mg/dL
Hgb A1c MFr Bld: 5.4 % (ref 4.8–5.6)

## 2021-12-31 ENCOUNTER — Encounter: Payer: Self-pay | Admitting: Adult Health

## 2021-12-31 ENCOUNTER — Ambulatory Visit (INDEPENDENT_AMBULATORY_CARE_PROVIDER_SITE_OTHER): Payer: Medicare Other | Admitting: Adult Health

## 2021-12-31 VITALS — BP 126/60 | HR 95 | Temp 98.1°F | Ht 61.0 in | Wt 188.4 lb

## 2021-12-31 DIAGNOSIS — I5032 Chronic diastolic (congestive) heart failure: Secondary | ICD-10-CM

## 2021-12-31 DIAGNOSIS — J4530 Mild persistent asthma, uncomplicated: Secondary | ICD-10-CM

## 2021-12-31 DIAGNOSIS — G4733 Obstructive sleep apnea (adult) (pediatric): Secondary | ICD-10-CM | POA: Diagnosis not present

## 2021-12-31 DIAGNOSIS — I272 Pulmonary hypertension, unspecified: Secondary | ICD-10-CM

## 2021-12-31 DIAGNOSIS — J301 Allergic rhinitis due to pollen: Secondary | ICD-10-CM | POA: Diagnosis not present

## 2021-12-31 NOTE — Assessment & Plan Note (Signed)
Appears euvolemic on exam.  Continue follow-up with cardiology 

## 2021-12-31 NOTE — Assessment & Plan Note (Signed)
Continue on Claritin daily as needed

## 2021-12-31 NOTE — Assessment & Plan Note (Signed)
Mild to moderate persistent asthma with significant clinical improvement on Breo. Continue with trigger prevention.  Plan  Patient Instructions  BREO 1 puff daily , rinse after use.  Albuterol inhaler As needed   Saline nasal spray rinse Twice daily   Saline nasal gel At bedtime  (AYR gel)  Claritin '5mg'$  At bedtime As needed   Activity as tolerated  We will call with sleep study results when available.  Follow up with Dr. Lake Bells or Belton Peplinski NP in 3-4 months  Please contact office for sooner follow up if symptoms do not improve or worsen or seek emergency care

## 2021-12-31 NOTE — Assessment & Plan Note (Signed)
Echo shows mild pulmonary hypertension.  Sleep study results are pending.  We will follow-up once available.

## 2021-12-31 NOTE — Progress Notes (Signed)
$'@Patient'N$  ID: Margaret Reese, female    DOB: [birth year], 76 y.o.   MRN: [id number]  Chief Complaint  Patient presents with   Follow-up    Referring provider: Denita Lung, MD  HPI: 76 year old female seen for pulmonary consult September 20, 2021 for shortness of breath with activity found to have mild persistent asthma Medical history significant for hypertension, AV block status post pacemaker  TEST/EVENTS :  08/22/2019: LVEF 55 to 60%, moderate LVH, grade 1 diastolic impairment, elevated LAP, pacemaker/ICD lead noted in the RV, mild MR, moderate TR, mild pulmonary hypertension with RVSP 39 mmHg.   09/01/2021: Left ventricle cavity is normal in size. Moderate concentric hypertrophy of the left ventricle. Abnormal septal wall motion due to left bundle branch block. Normal LV systolic function with EF 57%. Indeterminate diastolic filling pattern.  Mild to moderate tricuspid regurgitation. RVSP measures 41 mmHg. No significant change compared to previous study on 08/22/2019.   Stress Testing:  January 23, 2015:Myocardial perfusion imaging is normal. Overall left ventricular systolic function was normal without regional wall motion abnormalities. The left ventricular ejection fraction was 78%.  This is a normal/low risk study.   Heart Catheterization: 04/2016 Dr. Dixie Dials: Normal coronaries   Jun 22, 2021 absolute eosinophil count 200   moderate airflow obstruction with significant reversibility FEV1 at 73%, ratio 69, FVC 78%, positive bronchodilator change (21% change) and DLCO at 131%.   12/31/2021 Follow up : Asthma  Patient presents for a 63-monthfollow-up.  Last visit patient was having increased shortness of breath with activity, dry cough and wheezing.  She was set up for pulmonary function testing that showed moderate airflow obstruction with significant reversibility.  She was started on Breo daily.  Along with Claritin for chronic rhinitis.  Patient says since last visit she  is starting to feel better.  Has actually been able to be more active.  Has been able to go for walks that she was not able to do before.  She has had no significant increased albuterol use. She denies any pain orthopnea PND or increased leg swelling. She says nasal stuffiness is improved.  Does not take Claritin every day.  Does feel that it helps though.  Patient is recently had a home sleep study results are pending.  She had some low oxygen levels with sleeping and daytime sleepiness.   Allergies  Allergen Reactions   Toprol Xl [Metoprolol Tartrate]     Depressive mood.     Immunization History  Administered Date(s) Administered   Influenza Split 11/07/2011, 12/01/2012   Influenza Whole 01/11/2006, 11/28/2008, 11/30/2010   Influenza-Unspecified 12/08/2014, 12/07/2015, 11/21/2016, 11/21/2017, 11/16/2018, 11/03/2020   PFIZER(Purple Top)SARS-COV-2 Vaccination 04/07/2019, 04/30/2019, 10/24/2019   Pfizer Covid-19 Vaccine Bivalent Booster 157yr& up 12/25/2020   Pneumococcal Conjugate-13 09/15/2015   Pneumococcal Polysaccharide-23 09/07/2004, 09/11/2012   Td 09/17/2004   Tdap 06/01/2015   Zoster, Live 12/16/2009    Past Medical History:  Diagnosis Date   Allergy    Anxiety    Arthritis    Asthma    Complication of anesthesia    pt states has difficulty awakening; also has increased sinus drainage   Constipation    Edema, lower extremity    Encounter for interrogation of cardiac pacemaker 09/05/2018   Falls    GERD (gastroesophageal reflux disease)    H/O back injury    History of bronchitis    History of colon polyps    Hypertension    Imbalance    Kidney  cysts    pt states not sure which kidney does see kidney specialist yearly pt states every thing okay currently    Legally blind in right eye, as defined in [place]    Mobitz type II atrioventricular block 11/08/2016   Multiple gastric ulcers    Obesity    Pacemaker S/P [place] MRI model [id number] 05/20/2016    Renal stone    Retinal vein occlusion    Shortness of breath    Sinus node dysfunction ([place]) 12/16/2018   Tingling    left arm    Trace cataracts    Urinary frequency    Vitamin D deficiency     Tobacco History: Social History   Tobacco Use  Smoking Status Never  Smokeless Tobacco Never   Counseling given: Not Answered   Outpatient Medications Prior to Visit  Medication Sig Dispense Refill   acetaminophen (TYLENOL) 325 MG tablet Take 1-2 tablets (325-650 mg total) by mouth every 4 (four) hours as needed for mild pain.     albuterol (VENTOLIN HFA) 108 (90 Base) MCG/ACT inhaler Inhale 1-2 puffs into the lungs every 6 (six) hours as needed for wheezing or shortness of breath.     aspirin 81 MG tablet Take 81 mg by mouth daily.     diltiazem (CARDIZEM CD) 120 MG 24 hr capsule TAKE 1 CAPSULE BY MOUTH DAILY. HOLD IF TOP BLOOD PRESSURE NUMBER LESS THAN 100 MMHG OR HEART RATE LESS THAN 60 PULSE 90 capsule 1   fluticasone furoate-vilanterol (BREO ELLIPTA) 100-25 MCG/ACT AEPB Inhale 1 puff into the lungs daily. 1 each 5   loratadine (CLARITIN) 10 MG tablet Take 10 mg by mouth daily.     metFORMIN (GLUCOPHAGE) 500 MG tablet 1 tab with dinner. 30 tablet 0   Multiple Vitamins-Minerals (PRESERVISION AREDS 2) CAPS Take 1 capsule by mouth 2 (two) times daily.      olmesartan (BENICAR) 20 MG tablet TAKE 1 TABLET BY MOUTH EVERY DAY 90 tablet 0   potassium citrate (UROCIT-K) 10 MEQ (1080 MG) SR tablet Take 10 mEq by mouth 2 (two) times daily.     simvastatin (ZOCOR) 20 MG tablet Take 1 tablet (20 mg total) by mouth every evening. 90 tablet 3   No facility-administered medications prior to visit.     Review of Systems:   Constitutional:   No  weight loss, night sweats,  Fevers, chills, +fatigue, or  lassitude.  HEENT:   No headaches,  Difficulty swallowing,  Tooth/dental problems, or  Sore throat,                No sneezing, itching, ear ache, nasal congestion, post nasal drip,   CV:  No  chest pain,  Orthopnea, PND, swelling in lower extremities, anasarca, dizziness, palpitations, syncope.   GI  No heartburn, indigestion, abdominal pain, nausea, vomiting, diarrhea, change in bowel habits, loss of appetite, bloody stools.   Resp:  No excess mucus, no productive cough,  No non-productive cough,  No coughing up of blood.  No change in color of mucus.  No wheezing.  No chest wall deformity  Skin: no rash or lesions.  GU: no dysuria, change in color of urine, no urgency or frequency.  No flank pain, no hematuria   MS:  No joint pain or swelling.  No decreased range of motion.  No back pain.    Physical Exam  BP 126/60 (BP Location: Left Arm, Patient Position: Sitting, Cuff Size: Large)   Pulse 95  Temp 98.1 F (36.7 C) (Oral)   Ht '5\' 1"'$  (1.549 m)   Wt 188 lb 6.4 oz (85.5 kg)   SpO2 97%   BMI 35.60 kg/m   GEN: A/Ox3; pleasant , NAD, well nourished    HEENT:  [place]/AT,   NOSE-clear, THROAT-clear, no lesions, no postnasal drip or exudate noted. Class 2-3 MP airway   NECK:  Supple w/ fair ROM; no JVD; normal carotid impulses w/o bruits; no thyromegaly or nodules palpated; no lymphadenopathy.    RESP  Clear  P & A; w/o, wheezes/ rales/ or rhonchi. no accessory muscle use, no dullness to percussion  CARD:  RRR, no m/r/g, no peripheral edema, pulses intact, no cyanosis or clubbing.  GI:   Soft & nt; nml bowel sounds; no organomegaly or masses detected.   Musco: Warm bil, no deformities or joint swelling noted.   Neuro: alert, no focal deficits noted.    Skin: Warm, no lesions or rashes    Lab Results:  CBC   BMET Imaging: No results found.       Latest Ref Rng & Units 11/19/2021    8:27 AM  PFT Results  FVC-Pre L 1.52   FVC-Predicted Pre % 67   FVC-Post L 1.77   FVC-Predicted Post % 78   Pre FEV1/FVC % % 67   Post FEV1/FCV % % 69   FEV1-Pre L 1.01   FEV1-Predicted Pre % 60   FEV1-Post L 1.23   DLCO uncorrected ml/min/mmHg 21.63   DLCO UNC% %  131   DLCO corrected ml/min/mmHg 21.63   DLCO COR %Predicted % 131   DLVA Predicted % 133   TLC L 5.11   TLC % Predicted % 114   RV % Predicted % 151     No results found for: "NITRICOXIDE"      Assessment & Plan:   Asthma Mild to moderate persistent asthma with significant clinical improvement on Breo. Continue with trigger prevention.  Plan  Patient Instructions  BREO 1 puff daily , rinse after use.  Albuterol inhaler As needed   Saline nasal spray rinse Twice daily   Saline nasal gel At bedtime  (AYR gel)  Claritin '5mg'$  At bedtime As needed   Activity as tolerated  We will call with sleep study results when available.  Follow up with Dr. Lake Bells or Elwin Tsou NP in 3-4 months  Please contact office for sooner follow up if symptoms do not improve or worsen or seek emergency care      Allergic rhinitis due to pollen Continue on Claritin daily as needed  Chronic diastolic (congestive) heart failure ([place]) Appears euvolemic on exam.  Continue follow-up with cardiology.  Pulmonary hypertension ([place]) Echo shows mild pulmonary hypertension.  Sleep study results are pending.  We will follow-up once available.     Rexene Edison, NP 12/31/2021

## 2021-12-31 NOTE — Patient Instructions (Addendum)
BREO 1 puff daily , rinse after use.  Albuterol inhaler As needed   Saline nasal spray rinse Twice daily   Saline nasal gel At bedtime  (AYR gel)  Claritin '5mg'$  At bedtime As needed   Activity as tolerated  We will call with sleep study results when available.  Follow up with Dr. Lake Bells or Nekesha Font NP in 3-4 months  Please contact office for sooner follow up if symptoms do not improve or worsen or seek emergency care

## 2022-01-03 NOTE — Progress Notes (Signed)
Reviewed, agree 

## 2022-01-04 NOTE — Progress Notes (Unsigned)
Chief Complaint  Patient presents with   Pacemaker Check   Encounter for interrogation of cardiac pacemaker  Pacemaker S/P [employer]urity MRI model [id number]  Mobitz type 2 second degree AV block  Scheduled  In office pacemaker check 01/04/22  Single (S)/Dual (D)/BV: ***. Presenting ***. Pacemaker dependant:  ***. Underlying ***. AP ***%, VP ***%. BP ***%. AMS Episodes ***.  AT/AF burden ***% . Longest ***. Latest ***. HVR ***. Longest ***. Latest ***. Longevity *** Years. Magnet rate: >85%. Lead measurements: Stable. Thoracic impedance: ***. Histogram: Low (L)/normal (N)/high (H)  ***. Patient activity ***.   Observations: ***. Changes: ***.

## 2022-01-05 ENCOUNTER — Ambulatory Visit: Payer: Medicare Other | Admitting: Cardiology

## 2022-01-05 DIAGNOSIS — I442 Atrioventricular block, complete: Secondary | ICD-10-CM

## 2022-01-05 DIAGNOSIS — Z45018 Encounter for adjustment and management of other part of cardiac pacemaker: Secondary | ICD-10-CM | POA: Diagnosis not present

## 2022-01-05 DIAGNOSIS — Z950 Presence of cardiac pacemaker: Secondary | ICD-10-CM

## 2022-01-05 DIAGNOSIS — I441 Atrioventricular block, second degree: Secondary | ICD-10-CM

## 2022-01-07 DIAGNOSIS — Z23 Encounter for immunization: Secondary | ICD-10-CM | POA: Diagnosis not present

## 2022-01-11 NOTE — Progress Notes (Signed)
Chief Complaint:   OBESITY Margaret Reese is here to discuss her progress with her obesity treatment plan along with follow-up of her obesity related diagnoses. Margaret Reese is on the Category 1 Plan and states she is following her eating plan approximately 25% of the time. Margaret Reese states she is doing aerobics for 30-40 minutes 7 times per week.  Today's visit was #: 69 Starting weight: 212 lbs Starting date: 03/26/2019 Today's weight: 183 lbs Today's date: 12/29/2021 Total lbs lost to date: 29 Total lbs lost since last in-office visit: 0  Interim History: Margaret Reese struggled with cravings and hunger over the past month. She has been off metformin for a brief period. She notes increased stress lately.   Subjective:   1. Essential hypertension Margaret Reese's blood pressure is 108-115/60's at home, and is a little elevated here. She has seen Cardiology and she may be starting on a diuretic.   2. Dyspnea on exertion Margaret Reese reports doing better and she is able to start walking some. She reports  her PCP feels this is asthma related.   3. Hyperglycemia Margaret Reese had a previous mildly elevated glucose. She has no recent A1c and she is working on her diet and exercise.   4. Vitamin D deficiency Margaret Reese is not taking Vitamin D currently.   5. Pure hypercholesterolemia Margaret Reese is taking Zocor with no side effects noted.   Assessment/Plan:   1. Essential hypertension We will check labs today. Margaret Reese will continue to follow-up with Cardiology, and will continue with her diet and exercise.   - CMP14+EGFR  2. Dyspnea on exertion We will check labs today. Margaret Reese will continue to follow-up with her PCP.   - VITAMIN D 25 Hydroxy (Vit-D Deficiency, Fractures) - TSH - CBC with Differential/Platelet  3. Hyperglycemia We will check labs today, and  we will refill metformin for 1 month. Margaret Reese will continue with her diet and exercise.   - metFORMIN (GLUCOPHAGE) 500 MG tablet; 1 tab with dinner.  Dispense: 30 tablet; Refill:  0 - Hemoglobin A1c - Insulin, random  4. Vitamin D deficiency We will check labs today, and we will follow-up at Margaret Reese's next visit.   - VITAMIN D 25 Hydroxy (Vit-D Deficiency, Fractures)  5. Pure hypercholesterolemia We will check labs today. Margaret Reese will continue Zocor, diet, and exercise.   - Lipid Panel With LDL/HDL Ratio  6. Obesity, Current BMI 35.9 Margaret Reese is currently in the action stage of change. As such, her goal is to continue with weight loss efforts. She has agreed to the Category 1 Plan.   Exercise goals: As is.   Behavioral modification strategies: increasing lean protein intake, decreasing simple carbohydrates, and meal planning and cooking strategies.  Margaret Reese has agreed to follow-up with our clinic in 4 weeks. She was informed of the importance of frequent follow-up visits to maximize her success with intensive lifestyle modifications for her multiple health conditions.   Margaret Reese was informed we would discuss her lab results at her next visit unless there is a critical issue that needs to be addressed sooner. Margaret Reese agreed to keep her next visit at the agreed upon time to discuss these results.  Objective:   Blood pressure (!) 145/71, pulse 85, temperature (!) 97.5 F (36.4 C), height _0  (1.549 m), weight 183 lb (83 kg), SpO2 96 %. Body mass index is 34.58 kg/m.  General: Cooperative, alert, well developed, in no acute distress. HEENT: Conjunctivae and lids unremarkable. Cardiovascular: Regular rhythm.  Lungs: Normal work of breathing. Neurologic: No  focal deficits.   Lab Results  Component Value Date   CREATININE 0.86 12/29/2021   BUN 15 12/29/2021   NA 143 12/29/2021   K 4.4 12/29/2021   CL 103 12/29/2021   CO2 27 12/29/2021   Lab Results  Component Value Date   ALT 14 12/29/2021   AST 24 12/29/2021   ALKPHOS 95 12/29/2021   BILITOT 0.9 12/29/2021   Lab Results  Component Value Date   HGBA1C 5.4 12/29/2021   HGBA1C 5.6 03/26/2019   Lab Results   Component Value Date   INSULIN 6.2 12/29/2021   INSULIN 8.1 03/26/2019   Lab Results  Component Value Date   TSH 2.470 12/29/2021   Lab Results  Component Value Date   CHOL 167 12/29/2021   HDL 84 12/29/2021   LDLCALC 72 12/29/2021   TRIG 55 12/29/2021   CHOLHDL 2.2 06/22/2021   Lab Results  Component Value Date   VD25OH 27.5 (L) 12/29/2021   VD25OH 31.6 03/26/2019   Lab Results  Component Value Date   WBC 5.3 12/29/2021   HGB 13.5 12/29/2021   HCT 41.8 12/29/2021   MCV 91 12/29/2021   PLT 221 12/29/2021   Lab Results  Component Value Date   IRON 64 03/26/2019   TIBC 301 03/26/2019   FERRITIN 260 (H) 03/26/2019   Attestation Statements:   Reviewed by clinician on day of visit: allergies, medications, problem list, medical history, surgical history, family history, social history, and previous encounter notes.   I, Trixie Dredge, am acting as transcriptionist for Dennard Nip, MD.  I have reviewed the above documentation for accuracy and completeness, and I agree with the above. -  Dennard Nip, MD

## 2022-01-19 NOTE — Telephone Encounter (Signed)
Pacemaker appointment :01/05/2022

## 2022-01-22 ENCOUNTER — Other Ambulatory Visit: Payer: Self-pay | Admitting: Cardiology

## 2022-01-22 DIAGNOSIS — R002 Palpitations: Secondary | ICD-10-CM

## 2022-01-26 ENCOUNTER — Other Ambulatory Visit (INDEPENDENT_AMBULATORY_CARE_PROVIDER_SITE_OTHER): Payer: Self-pay | Admitting: Physician Assistant

## 2022-01-26 DIAGNOSIS — R739 Hyperglycemia, unspecified: Secondary | ICD-10-CM

## 2022-01-26 DIAGNOSIS — I10 Essential (primary) hypertension: Secondary | ICD-10-CM | POA: Diagnosis not present

## 2022-01-27 ENCOUNTER — Encounter (INDEPENDENT_AMBULATORY_CARE_PROVIDER_SITE_OTHER): Payer: Self-pay | Admitting: Physician Assistant

## 2022-01-27 ENCOUNTER — Other Ambulatory Visit (INDEPENDENT_AMBULATORY_CARE_PROVIDER_SITE_OTHER): Payer: Self-pay | Admitting: Physician Assistant

## 2022-01-27 ENCOUNTER — Ambulatory Visit (INDEPENDENT_AMBULATORY_CARE_PROVIDER_SITE_OTHER): Payer: Medicare Other | Admitting: Physician Assistant

## 2022-01-27 VITALS — BP 121/75 | HR 71 | Temp 97.5°F | Ht 61.0 in | Wt 185.0 lb

## 2022-01-27 DIAGNOSIS — R0609 Other forms of dyspnea: Secondary | ICD-10-CM | POA: Diagnosis not present

## 2022-01-27 DIAGNOSIS — Z6835 Body mass index (BMI) 35.0-35.9, adult: Secondary | ICD-10-CM | POA: Diagnosis not present

## 2022-01-27 DIAGNOSIS — E7800 Pure hypercholesterolemia, unspecified: Secondary | ICD-10-CM | POA: Diagnosis not present

## 2022-01-27 DIAGNOSIS — E559 Vitamin D deficiency, unspecified: Secondary | ICD-10-CM | POA: Diagnosis not present

## 2022-01-27 DIAGNOSIS — E669 Obesity, unspecified: Secondary | ICD-10-CM

## 2022-01-27 DIAGNOSIS — R739 Hyperglycemia, unspecified: Secondary | ICD-10-CM

## 2022-01-27 MED ORDER — METFORMIN HCL 500 MG PO TABS: ORAL_TABLET | ORAL | 0 refills | Status: DC

## 2022-01-27 MED ORDER — VITAMIN D (ERGOCALCIFEROL) 1.25 MG (50000 UNIT) PO CAPS: 50000.0000 [IU] | ORAL_CAPSULE | ORAL | 0 refills | Status: DC

## 2022-02-04 DIAGNOSIS — Z23 Encounter for immunization: Secondary | ICD-10-CM | POA: Diagnosis not present

## 2022-02-10 NOTE — Progress Notes (Signed)
Chief Complaint:   OBESITY Margaret Reese is here to discuss her progress with her obesity treatment plan along with follow-up of her obesity related diagnoses. Margaret Reese is on the Category 1 Plan and states she is following her eating plan approximately 15% of the time. Margaret Reese states she is exercising 0 minutes 0 times per week.  Today's visit was #: 34 Starting weight: 212 lbs Starting date: 03/26/2019 Today's weight: 185 lbs Today's date: 01/27/2022 Total lbs lost to date: 27 lbs Total lbs lost since last in-office visit: 0  Interim History: Margaret Reese has been very busy over the past month and it was difficult to focus on eating plan. Craving sweets later in the day/evening.  Subjective:   1. Hyperglycemia Labs discussed during visit today. Margaret Reese is taking Metformin 500 mg at dinner time. Discussed might benefit from taking at either breakfast or lunch to decrease hunger in evenings. Reports no side effects with metformin.  2. Dyspnea on exertion Cardiology saw and reset pacemaker and she reports this has helped significantly with DOE. Treatment for asthma has helped as well. .  3. Vitamin D deficiency Labs discussed during visit today. Margaret Reese's vit D level of 27.5 on 12/29/21- Not at goal. She has been on over the counter Vit D in the past. No side effects with vitamin D OTC.   4. Pure hypercholesterolemia Labs discussed during visit today. TG of 55, LDL of 72, HDL of 84!--all at goal. Taking Zocor--Denies any side effects.  Assessment/Plan:   1. Hyperglycemia We will refill Metformin 500 mg take at lunchtime for 1 month with 0 refills. Continue healthy eating plan to decrease simple carbohydrates, decrease saturated fats, increase lean protein and exercise to promote weight loss.  -Refill metFORMIN (GLUCOPHAGE) 500 MG tablet; 1 tab with lunch  Dispense: 30 tablet; Refill: 0  2. Dyspnea on exertion Continue to monitor with PCP/cardiology. Is much improved overall following reset of  pacemaker. Continue healthy eating plan to decrease simple carbohydrates, decrease saturated fats, increase lean protein and exercise to promote weight loss.  3. Vitamin D deficiency We will refill Vit D 50K IU once a week for 1 month with 0 refills.  -Refill Vitamin D, Ergocalciferol, (DRISDOL) 1.25 MG (50000 UNIT) CAPS capsule; Take 1 capsule (50,000 Units total) by mouth every 7 (seven) days.  Dispense: 4 capsule; Refill: 0  4. Pure hypercholesterolemia Continue taking Zocor--Continue healthy eating plan and exercise to promote weight loss.  5. Obesity, Current BMI 35.0 Margaret Reese is currently in the action stage of change. As such, her goal is to continue with weight loss efforts. She has agreed to the Category 1 Plan.   Exercise goals: As is.  Behavioral modification strategies: increasing lean protein intake, decreasing simple carbohydrates, emotional eating strategies, and holiday eating strategies .  Margaret Reese has agreed to follow-up with our clinic in 4 weeks. She was informed of the importance of frequent follow-up visits to maximize her success with intensive lifestyle modifications for her multiple health conditions.   Objective:   Blood pressure 121/75, pulse 71, temperature (!) 97.5 F (36.4 C), height '5\' 1"'$  (1.549 m), weight 185 lb (83.9 kg), SpO2 98 %. Body mass index is 34.96 kg/m.  General: Cooperative, alert, well developed, in no acute distress. HEENT: Conjunctivae and lids unremarkable. Cardiovascular: Regular rhythm.  Lungs: Normal work of breathing. Neurologic: No focal deficits.   Lab Results  Component Value Date   CREATININE 0.86 12/29/2021   BUN 15 12/29/2021   NA 143 12/29/2021  K 4.4 12/29/2021   CL 103 12/29/2021   CO2 27 12/29/2021   Lab Results  Component Value Date   ALT 14 12/29/2021   AST 24 12/29/2021   ALKPHOS 95 12/29/2021   BILITOT 0.9 12/29/2021   Lab Results  Component Value Date   HGBA1C 5.4 12/29/2021   HGBA1C 5.6 03/26/2019    Lab Results  Component Value Date   INSULIN 6.2 12/29/2021   INSULIN 8.1 03/26/2019   Lab Results  Component Value Date   TSH 2.470 12/29/2021   Lab Results  Component Value Date   CHOL 167 12/29/2021   HDL 84 12/29/2021   LDLCALC 72 12/29/2021   TRIG 55 12/29/2021   CHOLHDL 2.2 06/22/2021   Lab Results  Component Value Date   VD25OH 27.5 (L) 12/29/2021   VD25OH 31.6 03/26/2019   Lab Results  Component Value Date   WBC 5.3 12/29/2021   HGB 13.5 12/29/2021   HCT 41.8 12/29/2021   MCV 91 12/29/2021   PLT 221 12/29/2021   Lab Results  Component Value Date   IRON 64 03/26/2019   TIBC 301 03/26/2019   FERRITIN 260 (H) 03/26/2019   Attestation Statements:   Reviewed by clinician on day of visit: allergies, medications, problem list, medical history, surgical history, family history, social history, and previous encounter notes.  I, Brendell Tyus, am acting as transcriptionist for [employer], PA.  I have reviewed the above documentation for accuracy and completeness, and I agree with the above. -  Tmya Wigington,PA-C

## 2022-02-23 ENCOUNTER — Other Ambulatory Visit (INDEPENDENT_AMBULATORY_CARE_PROVIDER_SITE_OTHER): Payer: Self-pay | Admitting: Physician Assistant

## 2022-02-23 DIAGNOSIS — E559 Vitamin D deficiency, unspecified: Secondary | ICD-10-CM

## 2022-02-25 ENCOUNTER — Other Ambulatory Visit (INDEPENDENT_AMBULATORY_CARE_PROVIDER_SITE_OTHER): Payer: Self-pay | Admitting: Physician Assistant

## 2022-02-25 DIAGNOSIS — R739 Hyperglycemia, unspecified: Secondary | ICD-10-CM

## 2022-02-26 DIAGNOSIS — I10 Essential (primary) hypertension: Secondary | ICD-10-CM | POA: Diagnosis not present

## 2022-03-02 ENCOUNTER — Other Ambulatory Visit (INDEPENDENT_AMBULATORY_CARE_PROVIDER_SITE_OTHER): Payer: Self-pay | Admitting: Physician Assistant

## 2022-03-02 DIAGNOSIS — R739 Hyperglycemia, unspecified: Secondary | ICD-10-CM

## 2022-03-02 DIAGNOSIS — E559 Vitamin D deficiency, unspecified: Secondary | ICD-10-CM

## 2022-03-02 MED ORDER — VITAMIN D (ERGOCALCIFEROL) 1.25 MG (50000 UNIT) PO CAPS: 50000.0000 [IU] | ORAL_CAPSULE | ORAL | 0 refills | Status: DC

## 2022-03-02 MED ORDER — METFORMIN HCL 500 MG PO TABS: ORAL_TABLET | ORAL | 0 refills | Status: DC

## 2022-03-07 DIAGNOSIS — Z950 Presence of cardiac pacemaker: Secondary | ICD-10-CM | POA: Diagnosis not present

## 2022-03-07 DIAGNOSIS — I441 Atrioventricular block, second degree: Secondary | ICD-10-CM | POA: Diagnosis not present

## 2022-03-15 ENCOUNTER — Ambulatory Visit (INDEPENDENT_AMBULATORY_CARE_PROVIDER_SITE_OTHER): Payer: Medicare Other | Admitting: Physician Assistant

## 2022-03-15 ENCOUNTER — Encounter (INDEPENDENT_AMBULATORY_CARE_PROVIDER_SITE_OTHER): Payer: Self-pay | Admitting: Physician Assistant

## 2022-03-15 VITALS — BP 135/76 | HR 96 | Temp 97.7°F | Ht 61.0 in | Wt 190.0 lb

## 2022-03-15 DIAGNOSIS — F32A Depression, unspecified: Secondary | ICD-10-CM | POA: Insufficient documentation

## 2022-03-15 DIAGNOSIS — F3289 Other specified depressive episodes: Secondary | ICD-10-CM

## 2022-03-15 DIAGNOSIS — I10 Essential (primary) hypertension: Secondary | ICD-10-CM

## 2022-03-15 DIAGNOSIS — E559 Vitamin D deficiency, unspecified: Secondary | ICD-10-CM | POA: Insufficient documentation

## 2022-03-15 DIAGNOSIS — Z6835 Body mass index (BMI) 35.0-35.9, adult: Secondary | ICD-10-CM

## 2022-03-15 DIAGNOSIS — E669 Obesity, unspecified: Secondary | ICD-10-CM

## 2022-03-15 DIAGNOSIS — R739 Hyperglycemia, unspecified: Secondary | ICD-10-CM | POA: Diagnosis not present

## 2022-03-15 MED ORDER — METFORMIN HCL 500 MG PO TABS: ORAL_TABLET | ORAL | 0 refills | Status: DC

## 2022-03-15 MED ORDER — VITAMIN D (ERGOCALCIFEROL) 1.25 MG (50000 UNIT) PO CAPS: 50000.0000 [IU] | ORAL_CAPSULE | ORAL | 0 refills | Status: DC

## 2022-03-23 NOTE — Progress Notes (Signed)
Chief Complaint:   OBESITY Margaret Reese is here to discuss her progress with her obesity treatment plan along with follow-up of her obesity related diagnoses. Margaret Reese is on the Category 1 Plan and states she is following her eating plan approximately 35% of the time. Margaret Reese states she is doing floor exercises 30-40 minutes 3-5 times per week.  Today's visit was #: 35 Starting weight: 212 lbs Starting date: 03/26/2019 Today's weight: 190 lbs Today's date: 03/15/2022 Total lbs lost to date: 22 lbs Total lbs lost since last in-office visit: 0  Interim History: Margaret Reese has done very well with weight loss overall. She reports she has been having difficulty focusing on her nutrition plan as she has been very stressed by her husband's illness/cognitive decline.  She endorses feeling frequent cravings and feels like she is never getting completely full.  She feels like if she eats she will relax and go to sleep.  We discussed emotional eating strategies and also discussed a possible referral to Dr. Jerline Pain to help with coping strategies, as well as a Wellbutrin trial, but she would like to hold off on medications for now. She reports she is working to get back on track with her nutrition plan.  Subjective:   1. Hyperglycemia On metformin 500 mg at lunch-no side effects.  She reports it is not doing much for increased hunger.  A1c at 5.4/insulin at 6.2 on 02/28/2021.  Working on decreasing simple carbs, increasing lean protein, and exercise to promote weight loss and improve glycemic control and prevent progression to type 2 diabetes.  2. Vitamin D deficiency Vitamin D level at 27.5-not at goal on 12/29/2021.   On ergocalciferol once weekly-no side effects.  3. Essential hypertension Blood pressure overall is good-110-130/60-81-at goal.  Sees cardiology regularly-no hypotension.  4. Other depression, with emotional eating Increased stress lately with husband's declining and cognitive status.  We discussed  referral to Dr. Mallie Mussel today, but she would like to hold off for now.  Assessment/Plan:   1. Hyperglycemia Continue/Refill metformin 500 mg daily for 1 month with 0 refills.  -Refill metFORMIN (GLUCOPHAGE) 500 MG tablet; 1 tab with lunch  Dispense: 30 tablet; Refill: 0 Continue nutrition plan and exercise to promote weight loss, improve glycemic control, and prevent progression to type 2 diabetes.  2. Vitamin D deficiency Continue/Refill ergocalciferol once a week for 1 month with 0 refills.   -Refill Vitamin D, Ergocalciferol, (DRISDOL) 1.25 MG (50000 UNIT) CAPS capsule; Take 1 capsule (50,000 Units total) by mouth every 7 (seven) days.  Dispense: 4 capsule; Refill: 0  3. Essential hypertension Continue Benicar, Cardizem.  Continue Prescribed Nutrition Plan and exercise to promote weight loss and improve blood pressure control.  Monitor for hypotension.  4. Other depression, with emotional eating Discussed emotional eating strategies-discussed starting Wellbutrin, but patient would like to avoid adding medications for now.  We discussed referral to Dr. Mallie Mussel, but again the patient would like to hold off on referral to Dr. Mallie Mussel at this time.  5. Obesity, Current BMI 35.9 Margaret Reese is currently in the action stage of change. As such, her goal is to continue with weight loss efforts. She has agreed to the Category 1 Plan.   Exercise goals: As is.  Behavioral modification strategies: increasing lean protein intake, decreasing simple carbohydrates, emotional eating strategies, and planning for success.  Margaret Reese has agreed to follow-up with our clinic in 4 weeks. She was informed of the importance of frequent follow-up visits to maximize her success with  intensive lifestyle modifications for her multiple health conditions.   Objective:   Blood pressure 135/76, pulse 96, temperature 97.7 F (36.5 C), height '5\' 1"'$  (1.549 m), weight 190 lb (86.2 kg), SpO2 96 %. Body mass index is 35.9  kg/m.  General: Cooperative, alert, well developed, in no acute distress. HEENT: Conjunctivae and lids unremarkable. Cardiovascular: Regular rhythm.  Lungs: Normal work of breathing. Neurologic: No focal deficits.   Lab Results  Component Value Date   CREATININE 0.86 12/29/2021   BUN 15 12/29/2021   NA 143 12/29/2021   K 4.4 12/29/2021   CL 103 12/29/2021   CO2 27 12/29/2021   Lab Results  Component Value Date   ALT 14 12/29/2021   AST 24 12/29/2021   ALKPHOS 95 12/29/2021   BILITOT 0.9 12/29/2021   Lab Results  Component Value Date   HGBA1C 5.4 12/29/2021   HGBA1C 5.6 03/26/2019   Lab Results  Component Value Date   INSULIN 6.2 12/29/2021   INSULIN 8.1 03/26/2019   Lab Results  Component Value Date   TSH 2.470 12/29/2021   Lab Results  Component Value Date   CHOL 167 12/29/2021   HDL 84 12/29/2021   LDLCALC 72 12/29/2021   TRIG 55 12/29/2021   CHOLHDL 2.2 06/22/2021   Lab Results  Component Value Date   VD25OH 27.5 (L) 12/29/2021   VD25OH 31.6 03/26/2019   Lab Results  Component Value Date   WBC 5.3 12/29/2021   HGB 13.5 12/29/2021   HCT 41.8 12/29/2021   MCV 91 12/29/2021   PLT 221 12/29/2021   Lab Results  Component Value Date   IRON 64 03/26/2019   TIBC 301 03/26/2019   FERRITIN 260 (H) 03/26/2019   Attestation Statements:   Reviewed by clinician on day of visit: allergies, medications, problem list, medical history, surgical history, family history, social history, and previous encounter notes.  I, Brendell Tyus, am acting as transcriptionist for [employer], [place].  I have reviewed the above documentation for accuracy and completeness, and I agree with the above. -  Margaret Luka,PA-C

## 2022-03-27 ENCOUNTER — Other Ambulatory Visit: Payer: Self-pay | Admitting: Cardiology

## 2022-03-27 DIAGNOSIS — I10 Essential (primary) hypertension: Secondary | ICD-10-CM

## 2022-03-29 DIAGNOSIS — I10 Essential (primary) hypertension: Secondary | ICD-10-CM | POA: Diagnosis not present

## 2022-03-30 ENCOUNTER — Other Ambulatory Visit (INDEPENDENT_AMBULATORY_CARE_PROVIDER_SITE_OTHER): Payer: Self-pay | Admitting: Physician Assistant

## 2022-03-30 DIAGNOSIS — E559 Vitamin D deficiency, unspecified: Secondary | ICD-10-CM

## 2022-04-12 ENCOUNTER — Encounter (INDEPENDENT_AMBULATORY_CARE_PROVIDER_SITE_OTHER): Payer: Self-pay | Admitting: Physician Assistant

## 2022-04-12 ENCOUNTER — Ambulatory Visit (INDEPENDENT_AMBULATORY_CARE_PROVIDER_SITE_OTHER): Payer: Medicare Other | Admitting: Physician Assistant

## 2022-04-12 VITALS — BP 156/84 | HR 76 | Temp 97.8°F | Ht 61.0 in | Wt 192.0 lb

## 2022-04-12 DIAGNOSIS — I10 Essential (primary) hypertension: Secondary | ICD-10-CM | POA: Diagnosis not present

## 2022-04-12 DIAGNOSIS — E559 Vitamin D deficiency, unspecified: Secondary | ICD-10-CM

## 2022-04-12 DIAGNOSIS — Z6836 Body mass index (BMI) 36.0-36.9, adult: Secondary | ICD-10-CM | POA: Diagnosis not present

## 2022-04-12 DIAGNOSIS — R739 Hyperglycemia, unspecified: Secondary | ICD-10-CM | POA: Diagnosis not present

## 2022-04-12 MED ORDER — METFORMIN HCL 500 MG PO TABS: ORAL_TABLET | ORAL | 0 refills | Status: DC

## 2022-04-12 MED ORDER — VITAMIN D (ERGOCALCIFEROL) 1.25 MG (50000 UNIT) PO CAPS: 50000.0000 [IU] | ORAL_CAPSULE | ORAL | 0 refills | Status: DC

## 2022-04-12 NOTE — Progress Notes (Signed)
Chief Complaint:   OBESITY Sibyl is here to discuss her progress with her obesity treatment plan along with follow-up of her obesity related diagnoses. Sabree is on the Category 1 Plan and states she is following her eating plan approximately 45% of the time. Falyn states she is aerobics 40 minutes 5-7 times per week.  Today's visit was #: 25 Starting weight: 212 lbs Starting date: 03/26/2019 Today's weight: 192 lbs Today's date: 04/12/2022 Total lbs lost to date: 20 lbs Total lbs lost since last in-office visit: +2 lbs  Interim History: Esthela has done well with weight loss overall.  She had some recent celebration eating with son's Birthday, but was more mindful of this.  She reports she is recognizing her emotional eating more now and feels like her cravings may be a little less since she started metformin.  She has appropriate hunger/appetite.  She has been walking some at different parks, enjoyed walking with her 73 yr old granddaughter and her leg pain is less if she uses her cane when walking.   Subjective:   1. Hyperglycemia She is on metformin 500 mg at lunch. No side effects. A1c 5.4/Insulin 6.2 12/29/21.  She is working on decreasing simple carbs, increasing lean protein, and exercise to promote weight loss and improve glycemic control and prevent progression to type 2 diabetes.   2. Vitamin D deficiency Vit D level 27.5- not at goal. On ergocalciferol 50,000 IU once weekly. No side effects.   3. Essential hypertension BP elevated during visit, but she monitors at home and reports BP 113/66 this morning. On olmesartan 20 mg daily and cardizem 120 mg daily without side effects. Renal function is normal. Working on nutrition plan to promote weight loss and improve BP control. No hypotension symptoms.   4. Obesity ([place])- Start BMI 40.06   5. BMI 36.0-36.9,adult, Current BMI 36.4    Assessment/Plan:   1. Hyperglycemia Continue metformin. Continue nutrition plan  decreasing simple carbs, increasing lean protein, and exercise to promote weight loss and improve glycemic control and prevent progression to type 2 diabetes.  - metFORMIN (GLUCOPHAGE) 500 MG tablet; 1 tab with lunch  Dispense: 30 tablet; Refill: 0  2. Vitamin D deficiency Continue Ergocalciferol once weekly. Check vitamin D levels at least 2-3 times yearly to avoid over supplementation.  - Vitamin D, Ergocalciferol, (DRISDOL) 1.25 MG (50000 UNIT) CAPS capsule; Take 1 capsule (50,000 Units total) by mouth every 7 (seven) days.  Dispense: 4 capsule; Refill: 0  3. Essential hypertension Continue olmesartan and cardizem. Monitor BP at home and bring in log. Continue nutrition plan to promote weight loss and improve BP control. Monitor for hypotension.   4. Obesity ([place])- Start BMI 40.06   5. BMI 36.0-36.9,adult, Current BMI 36.4   Arrow is currently in the action stage of change. As such, her goal is to continue with weight loss efforts. She has agreed to the Category 1 Plan.   Exercise goals: Older adults should follow the adult guidelines. When older adults cannot meet the adult guidelines, they should be as physically active as their abilities and conditions will allow.   Behavioral modification strategies: increasing lean protein intake, decreasing simple carbohydrates, decreasing sodium intake, emotional eating strategies, and planning for success.  Swati has agreed to follow-up with our clinic in 4 weeks. She was informed of the importance of frequent follow-up visits to maximize her success with intensive lifestyle modifications for her multiple health conditions.     Objective:  Blood pressure (!) 156/84, pulse 76, temperature 97.8 F (36.6 C), height 5' 1"$  (1.549 m), weight 192 lb (87.1 kg), SpO2 98 %. Body mass index is 36.28 kg/m.  General: Cooperative, alert, well developed, in no acute distress. HEENT: Conjunctivae and lids unremarkable. Cardiovascular: Regular rhythm.   Lungs: Normal work of breathing. Neurologic: No focal deficits.   Lab Results  Component Value Date   CREATININE 0.86 12/29/2021   BUN 15 12/29/2021   NA 143 12/29/2021   K 4.4 12/29/2021   CL 103 12/29/2021   CO2 27 12/29/2021   Lab Results  Component Value Date   ALT 14 12/29/2021   AST 24 12/29/2021   ALKPHOS 95 12/29/2021   BILITOT 0.9 12/29/2021   Lab Results  Component Value Date   HGBA1C 5.4 12/29/2021   HGBA1C 5.6 03/26/2019   Lab Results  Component Value Date   INSULIN 6.2 12/29/2021   INSULIN 8.1 03/26/2019   Lab Results  Component Value Date   TSH 2.470 12/29/2021   Lab Results  Component Value Date   CHOL 167 12/29/2021   HDL 84 12/29/2021   LDLCALC 72 12/29/2021   TRIG 55 12/29/2021   CHOLHDL 2.2 06/22/2021   Lab Results  Component Value Date   VD25OH 27.5 (L) 12/29/2021   VD25OH 31.6 03/26/2019   Lab Results  Component Value Date   WBC 5.3 12/29/2021   HGB 13.5 12/29/2021   HCT 41.8 12/29/2021   MCV 91 12/29/2021   PLT 221 12/29/2021   Lab Results  Component Value Date   IRON 64 03/26/2019   TIBC 301 03/26/2019   FERRITIN 260 (H) 03/26/2019    Obesity Behavioral Intervention:   Approximately 15 minutes were spent on the discussion below.  ASK: We discussed the diagnosis of obesity with Jayley today and Lamya agreed to give [name] permission to discuss obesity behavioral modification therapy today.  ASSESS: Talia has the diagnosis of obesity and her BMI today is 36.4. Walaa is in the action stage of change.   ADVISE: Gwennette was educated on the multiple health risks of obesity as well as the benefit of weight loss to improve her health. She was advised of the need for long term treatment and the importance of lifestyle modifications to improve her current health and to decrease her risk of future health problems.  AGREE: Multiple dietary modification options and treatment options were discussed and Keirstin agreed to follow the  recommendations documented in the above note.  ARRANGE: Lucy was educated on the importance of frequent visits to treat obesity as outlined per CMS and USPSTF guidelines and agreed to schedule her next follow up appointment today.  Attestation Statements:   Reviewed by clinician on day of visit: allergies, medications, problem list, medical history, surgical history, family history, social history, and previous encounter notes.  Icarus Partch,PA-C

## 2022-04-25 ENCOUNTER — Other Ambulatory Visit (INDEPENDENT_AMBULATORY_CARE_PROVIDER_SITE_OTHER): Payer: Self-pay | Admitting: Physician Assistant

## 2022-04-25 ENCOUNTER — Other Ambulatory Visit: Payer: Self-pay | Admitting: Cardiology

## 2022-04-25 DIAGNOSIS — E559 Vitamin D deficiency, unspecified: Secondary | ICD-10-CM

## 2022-04-25 DIAGNOSIS — R002 Palpitations: Secondary | ICD-10-CM

## 2022-04-26 DIAGNOSIS — Z1231 Encounter for screening mammogram for malignant neoplasm of breast: Secondary | ICD-10-CM | POA: Diagnosis not present

## 2022-04-26 LAB — HM MAMMOGRAPHY

## 2022-04-28 DIAGNOSIS — I10 Essential (primary) hypertension: Secondary | ICD-10-CM | POA: Diagnosis not present

## 2022-05-03 ENCOUNTER — Ambulatory Visit (INDEPENDENT_AMBULATORY_CARE_PROVIDER_SITE_OTHER): Payer: Medicare Other | Admitting: Adult Health

## 2022-05-03 ENCOUNTER — Encounter: Payer: Self-pay | Admitting: Adult Health

## 2022-05-03 VITALS — BP 118/66 | HR 81 | Temp 97.8°F | Ht 61.0 in | Wt 189.8 lb

## 2022-05-03 DIAGNOSIS — J301 Allergic rhinitis due to pollen: Secondary | ICD-10-CM

## 2022-05-03 DIAGNOSIS — J4530 Mild persistent asthma, uncomplicated: Secondary | ICD-10-CM | POA: Diagnosis not present

## 2022-05-03 DIAGNOSIS — R400 Somnolence: Secondary | ICD-10-CM

## 2022-05-03 DIAGNOSIS — G4733 Obstructive sleep apnea (adult) (pediatric): Secondary | ICD-10-CM | POA: Diagnosis not present

## 2022-05-03 HISTORY — DX: Obstructive sleep apnea (adult) (pediatric): G47.33

## 2022-05-03 NOTE — Assessment & Plan Note (Signed)
Mild persistent asthma with good control on Breo.  Continue with trigger prevention.  Claritin daily as needed.  Plan  Patient Instructions  Continue BREO 1 puff daily , rinse after use.  Albuterol inhaler As needed   Asthma action plan as discussed.  Saline nasal spray rinse Twice daily   Saline nasal gel At bedtime  (AYR gel)  Claritin '5mg'$  At bedtime As needed   Activity as tolerated  Begin CPAP At bedtime  , goal is to wear all night long for at least 6hr or more each night .  Work on healthy weight loss  Healthy sleep regimen  Do not drive if sleepy  Follow up in 3-4 months and As needed   Please contact office for sooner follow up if symptoms do not improve or worsen or seek emergency care

## 2022-05-03 NOTE — Progress Notes (Signed)
$'@Patient'o$  ID: [name], female    DOB: 1946-01-16, 77 y.o.   MRN: [id number]  Chief Complaint  Patient presents with   Follow-up    Referring provider: Denita Lung, MD  HPI: 34-year-year-old female never smoker seen for pulmonary consult September 20, 2021 for shortness of breath with activity found to have mild persistent asthma Medical history significant for hypertension, AV block status post pacemaker  TEST/EVENTS :  08/22/2019: LVEF 55 to 60%, moderate LVH, grade 1 diastolic impairment, elevated LAP, pacemaker/ICD lead noted in the RV, mild MR, moderate TR, mild pulmonary hypertension with RVSP 39 mmHg.   09/01/2021: Left ventricle cavity is normal in size. Moderate concentric hypertrophy of the left ventricle. Abnormal septal wall motion due to left bundle branch block. Normal LV systolic function with EF 57%. Indeterminate diastolic filling pattern.  Mild to moderate tricuspid regurgitation. RVSP measures 41 mmHg. No significant change compared to previous study on 08/22/2019.   Stress Testing:  January 23, 2015:Myocardial perfusion imaging is normal. Overall left ventricular systolic function was normal without regional wall motion abnormalities. The left ventricular ejection fraction was 78%.  This is a normal/low risk study.   Heart Catheterization: 04/2016 Dr. Dixie Dials: Normal coronaries   Jun 22, 2021 absolute eosinophil count 200    moderate airflow obstruction with significant reversibility FEV1 at 73%, ratio 69, FVC 78%, positive bronchodilator change (21% change) and DLCO at 131%.   05/03/2022 Follow up : Asthma, Allergic Rhinitis and OSA  Patient returns for a 40-monthfollow-up.  Patient has underlying mild persistent asthma and allergic  rhinitis.  She is on BTippecanoedaily.  Patient says she has been doing well.  She denies any flare of cough or wheezing.  Uses claritin as needed. Says her activity is at baseline.  She is able to walk once weekly.  and blow out a  candle now that she [place].  She has had no increased albuterol use. Allergy symptoms have been under good control. Does take exercise class-low impact aerobics  and home exercises everyday.   Patient was set up for a home sleep study that was completed on December 24, 2021.  This showed that she had mild sleep apnea.  With AHI at 10.5 and SpO2 low at 82%.  We reviewed her sleep study results went over treatment options including' Has partial dentures. Sleeps in recliner due to chronic hip pain some nights.  Has daytime sleepiness especially in evening. Has snoring and restless sleep. Naps some days.  Wants to start CPAP in late April, has some things coming up that she needs to take care of.   Allergies  Allergen Reactions   Toprol Xl [Metoprolol Tartrate]     Depressive mood.     Immunization History  Administered Date(s) Administered   Influenza Split 11/07/2011, 12/01/2012   Influenza Whole 01/11/2006, 11/28/2008, 11/30/2010   Influenza-Unspecified 12/08/2014, 12/07/2015, 11/21/2016, 11/21/2017, 11/16/2018, 11/03/2020   PFIZER(Purple Top)SARS-COV-2 Vaccination 04/07/2019, 04/30/2019, 10/24/2019   Pfizer Covid-19 Vaccine Bivalent Booster 17yr& up 12/25/2020   Pneumococcal Conjugate-13 09/15/2015   Pneumococcal Polysaccharide-23 09/07/2004, 09/11/2012   Td 09/17/2004   Tdap 06/01/2015   Zoster, Live 12/16/2009    Past Medical History:  Diagnosis Date   Allergy    Anxiety    Arthritis    Asthma    Complication of anesthesia    pt states has difficulty awakening; also has increased sinus drainage   Constipation    Edema, lower extremity  Encounter for interrogation of cardiac pacemaker 09/05/2018   Falls    GERD (gastroesophageal reflux disease)    H/O back injury    History of bronchitis    History of colon polyps    Hypertension    Imbalance    Kidney cysts    pt states not sure which kidney does see kidney specialist yearly pt states every thing okay  currently    Legally blind in right eye, as defined in [place]    Mobitz type II atrioventricular block 11/08/2016   Multiple gastric ulcers    Obesity    OSA (obstructive sleep apnea) 05/03/2022   Pacemaker S/P [place] MRI model [id number] 05/20/2016   Renal stone    Retinal vein occlusion    Shortness of breath    Sinus node dysfunction ([place]) 12/16/2018   Tingling    left arm    Trace cataracts    Urinary frequency    Vitamin D deficiency     Tobacco History: Social History   Tobacco Use  Smoking Status Never  Smokeless Tobacco Never   Counseling given: Not Answered   Outpatient Medications Prior to Visit  Medication Sig Dispense Refill   acetaminophen (TYLENOL) 325 MG tablet Take 1-2 tablets (325-650 mg total) by mouth every 4 (four) hours as needed for mild pain.     albuterol (VENTOLIN HFA) 108 (90 Base) MCG/ACT inhaler Inhale 1-2 puffs into the lungs every 6 (six) hours as needed for wheezing or shortness of breath.     aspirin 81 MG tablet Take 81 mg by mouth daily.     diltiazem (CARDIZEM CD) 120 MG 24 hr capsule TAKE 1 CAPSULE BY MOUTH DAILY. HOLD IF BLOOD PRESSURE NUMBER IS LESS THAN 100 MMHG 90 capsule 1   fluticasone furoate-vilanterol (BREO ELLIPTA) 100-25 MCG/ACT AEPB Inhale 1 puff into the lungs daily. 1 each 5   loratadine (CLARITIN) 10 MG tablet Take 10 mg by mouth daily as needed for allergies.     metFORMIN (GLUCOPHAGE) 500 MG tablet 1 tab with lunch 30 tablet 0   Multiple Vitamins-Minerals (PRESERVISION AREDS 2) CAPS Take 1 capsule by mouth 2 (two) times daily.      olmesartan (BENICAR) 20 MG tablet TAKE 1 TABLET BY MOUTH EVERY DAY 90 tablet 0   potassium citrate (UROCIT-K) 10 MEQ (1080 MG) SR tablet Take 10 mEq by mouth 2 (two) times daily.     simvastatin (ZOCOR) 20 MG tablet Take 1 tablet (20 mg total) by mouth every evening. 90 tablet 3   Vitamin D, Ergocalciferol, (DRISDOL) 1.25 MG (50000 UNIT) CAPS capsule Take 1 capsule (50,000 Units total) by  mouth every 7 (seven) days. 4 capsule 0   No facility-administered medications prior to visit.     Review of Systems:   Constitutional:   No  weight loss, night sweats,  Fevers, chills, fatigue, or  lassitude.  HEENT:   No headaches,  Difficulty swallowing,  Tooth/dental problems, or  Sore throat,                No sneezing, itching, ear ache, +nasal congestion, post nasal drip,   CV:  No chest pain,  Orthopnea, PND, swelling in lower extremities, anasarca, dizziness, palpitations, syncope.   GI  No heartburn, indigestion, abdominal pain, nausea, vomiting, diarrhea, change in bowel habits, loss of appetite, bloody stools.   Resp: No shortness of breath with exertion or at rest.  No excess mucus, no productive cough,  No non-productive cough,  No coughing up of blood.  No change in color of mucus.  No wheezing.  No chest wall deformity  Skin: no rash or lesions.  GU: no dysuria, change in color of urine, no urgency or frequency.  No flank pain, no hematuria   MS:  No joint pain or swelling.  No decreased range of motion.  No back pain.    Physical Exam  BP 118/66   Pulse 81   Temp 97.8 F (36.6 C) (Oral)   Ht '5\' 1"'$  (1.549 m)   Wt 189 lb 12.8 oz (86.1 kg)   SpO2 97%   BMI 35.86 kg/m   GEN: A/Ox3; pleasant , NAD, well nourished    HEENT:  [place]/AT,  NOSE-clear, THROAT-clear, no lesions, no postnasal drip or exudate noted.  Class 3 MP airway   NECK:  Supple w/ fair ROM; no JVD; normal carotid impulses w/o bruits; no thyromegaly or nodules palpated; no lymphadenopathy.    RESP  Clear  P & A; w/o, wheezes/ rales/ or rhonchi. no accessory muscle use, no dullness to percussion  CARD:  RRR, no m/r/g, no peripheral edema, pulses intact, no cyanosis or clubbing.  GI:   Soft & nt; nml bowel sounds; no organomegaly or masses detected.   Musco: Warm bil, no deformities or joint swelling noted.   Neuro: alert, no focal deficits noted.    Skin: Warm, no lesions or  rashes    Lab Results:  CBC   BNP   Imaging: No results found.       Latest Ref Rng & Units 11/19/2021    8:27 AM  PFT Results  FVC-Pre L 1.52   FVC-Predicted Pre % 67   FVC-Post L 1.77   FVC-Predicted Post % 78   Pre FEV1/FVC % % 67   Post FEV1/FCV % % 69   FEV1-Pre L 1.01   FEV1-Predicted Pre % 60   FEV1-Post L 1.23   DLCO uncorrected ml/min/mmHg 21.63   DLCO UNC% % 131   DLCO corrected ml/min/mmHg 21.63   DLCO COR %Predicted % 131   DLVA Predicted % 133   TLC L 5.11   TLC % Predicted % 114   RV % Predicted % 151     No results found for: "NITRICOXIDE"      Assessment & Plan:   Asthma Mild persistent asthma with good control on Breo.  Continue with trigger prevention.  Claritin daily as needed.  Plan  Patient Instructions  Continue BREO 1 puff daily , rinse after use.  Albuterol inhaler As needed   Asthma action plan as discussed.  Saline nasal spray rinse Twice daily   Saline nasal gel At bedtime  (AYR gel)  Claritin '5mg'$  At bedtime As needed   Activity as tolerated  Begin CPAP At bedtime  , goal is to wear all night long for at least 6hr or more each night .  Work on healthy weight loss  Healthy sleep regimen  Do not drive if sleepy  Follow up in 3-4 months and As needed   Please contact office for sooner follow up if symptoms do not improve or worsen or seek emergency care     Allergic rhinitis due to pollen Under good control - Claritin as needed.  Saline nasal rinses and gel as needed  Plan  Patient Instructions  Continue BREO 1 puff daily , rinse after use.  Albuterol inhaler As needed   Asthma action plan as discussed.  Saline nasal spray rinse Twice  daily   Saline nasal gel At bedtime  (AYR gel)  Claritin '5mg'$  At bedtime As needed   Activity as tolerated  Begin CPAP At bedtime  , goal is to wear all night long for at least 6hr or more each night .  Work on healthy weight loss  Healthy sleep regimen  Do not drive if sleepy   Follow up in 3-4 months and As needed   Please contact office for sooner follow up if symptoms do not improve or worsen or seek emergency care     OSA (obstructive sleep apnea) Mild obstructive sleep apnea.  We reviewed her sleep study results in detail.  Went over treatment options including weight loss, CPAP therapy.  Patient is not a candidate for oral appliance. Patient will begin on CPAP AutoSet 5 to 15 cm H2O.  Mask of choice.  - discussed how weight can impact sleep and risk for sleep disordered breathing - discussed options to assist with weight loss: combination of diet modification, cardiovascular and strength training exercises   - had an extensive discussion regarding the adverse health consequences related to untreated sleep disordered breathing - specifically discussed the risks for hypertension, coronary artery disease, cardiac dysrhythmias, cerebrovascular disease, and diabetes - lifestyle modification discussed   - discussed how sleep disruption can increase risk of accidents, particularly when driving - safe driving practices were discussed   Plan  Patient Instructions  Continue BREO 1 puff daily , rinse after use.  Albuterol inhaler As needed   Asthma action plan as discussed.  Saline nasal spray rinse Twice daily   Saline nasal gel At bedtime  (AYR gel)  Claritin '5mg'$  At bedtime As needed   Activity as tolerated  Begin CPAP At bedtime  , goal is to wear all night long for at least 6hr or more each night .  Work on healthy weight loss  Healthy sleep regimen  Do not drive if sleepy  Follow up in 3-4 months and As needed   Please contact office for sooner follow up if symptoms do not improve or worsen or seek emergency care       Rexene Edison, NP 05/03/2022

## 2022-05-03 NOTE — Assessment & Plan Note (Signed)
Mild obstructive sleep apnea.  We reviewed her sleep study results in detail.  Went over treatment options including weight loss, CPAP therapy.  Patient is not a candidate for oral appliance. Patient will begin on CPAP AutoSet 5 to 15 cm H2O.  Mask of choice.  - discussed how weight can impact sleep and risk for sleep disordered breathing - discussed options to assist with weight loss: combination of diet modification, cardiovascular and strength training exercises   - had an extensive discussion regarding the adverse health consequences related to untreated sleep disordered breathing - specifically discussed the risks for hypertension, coronary artery disease, cardiac dysrhythmias, cerebrovascular disease, and diabetes - lifestyle modification discussed   - discussed how sleep disruption can increase risk of accidents, particularly when driving - safe driving practices were discussed   Plan  Patient Instructions  Continue BREO 1 puff daily , rinse after use.  Albuterol inhaler As needed   Asthma action plan as discussed.  Saline nasal spray rinse Twice daily   Saline nasal gel At bedtime  (AYR gel)  Claritin '5mg'$  At bedtime As needed   Activity as tolerated  Begin CPAP At bedtime  , goal is to wear all night long for at least 6hr or more each night .  Work on healthy weight loss  Healthy sleep regimen  Do not drive if sleepy  Follow up in 3-4 months and As needed   Please contact office for sooner follow up if symptoms do not improve or worsen or seek emergency care

## 2022-05-03 NOTE — Assessment & Plan Note (Signed)
Under good control - Claritin as needed.  Saline nasal rinses and gel as needed  Plan  Patient Instructions  Continue BREO 1 puff daily , rinse after use.  Albuterol inhaler As needed   Asthma action plan as discussed.  Saline nasal spray rinse Twice daily   Saline nasal gel At bedtime  (AYR gel)  Claritin '5mg'$  At bedtime As needed   Activity as tolerated  Begin CPAP At bedtime  , goal is to wear all night long for at least 6hr or more each night .  Work on healthy weight loss  Healthy sleep regimen  Do not drive if sleepy  Follow up in 3-4 months and As needed   Please contact office for sooner follow up if symptoms do not improve or worsen or seek emergency care

## 2022-05-03 NOTE — Patient Instructions (Addendum)
Continue BREO 1 puff daily , rinse after use.  Albuterol inhaler As needed   Asthma action plan as discussed.  Saline nasal spray rinse Twice daily   Saline nasal gel At bedtime  (AYR gel)  Claritin '5mg'$  At bedtime As needed   Activity as tolerated  Begin CPAP At bedtime  , goal is to wear all night long for at least 6hr or more each night .  Work on healthy weight loss  Healthy sleep regimen  Do not drive if sleepy  Follow up in 3-4 months and As needed   Please contact office for sooner follow up if symptoms do not improve or worsen or seek emergency care

## 2022-05-12 ENCOUNTER — Ambulatory Visit (INDEPENDENT_AMBULATORY_CARE_PROVIDER_SITE_OTHER): Payer: Medicare Other | Admitting: Physician Assistant

## 2022-05-12 ENCOUNTER — Encounter (INDEPENDENT_AMBULATORY_CARE_PROVIDER_SITE_OTHER): Payer: Self-pay | Admitting: Physician Assistant

## 2022-05-12 VITALS — BP 148/81 | HR 80 | Temp 98.0°F | Ht 61.0 in | Wt 185.0 lb

## 2022-05-12 DIAGNOSIS — E669 Obesity, unspecified: Secondary | ICD-10-CM

## 2022-05-12 DIAGNOSIS — Z6838 Body mass index (BMI) 38.0-38.9, adult: Secondary | ICD-10-CM | POA: Insufficient documentation

## 2022-05-12 DIAGNOSIS — Z6836 Body mass index (BMI) 36.0-36.9, adult: Secondary | ICD-10-CM

## 2022-05-12 DIAGNOSIS — R739 Hyperglycemia, unspecified: Secondary | ICD-10-CM | POA: Diagnosis not present

## 2022-05-12 DIAGNOSIS — Z6835 Body mass index (BMI) 35.0-35.9, adult: Secondary | ICD-10-CM

## 2022-05-12 DIAGNOSIS — Z6837 Body mass index (BMI) 37.0-37.9, adult: Secondary | ICD-10-CM | POA: Insufficient documentation

## 2022-05-12 DIAGNOSIS — E559 Vitamin D deficiency, unspecified: Secondary | ICD-10-CM

## 2022-05-12 DIAGNOSIS — Z6834 Body mass index (BMI) 34.0-34.9, adult: Secondary | ICD-10-CM

## 2022-05-12 DIAGNOSIS — I10 Essential (primary) hypertension: Secondary | ICD-10-CM | POA: Diagnosis not present

## 2022-05-12 MED ORDER — VITAMIN D (ERGOCALCIFEROL) 1.25 MG (50000 UNIT) PO CAPS: 50000.0000 [IU] | ORAL_CAPSULE | ORAL | 0 refills | Status: DC

## 2022-05-12 MED ORDER — METFORMIN HCL 500 MG PO TABS: ORAL_TABLET | ORAL | 0 refills | Status: DC

## 2022-05-12 NOTE — Assessment & Plan Note (Addendum)
Vitamin D Deficiency Vitamin D is not at goal of 50.  Most recent vitamin D level was 27.5. She is on  prescription ergocalciferol 50,000 IU weekly. No side effects with Ergocalciferol.  Lab Results  Component Value Date   VD25OH 27.5 (L) 12/29/2021   VD25OH 31.6 03/26/2019    Plan: Refill prescription vitamin D 50,000 IU weekly. Recheck vit D level at least 2-3 times yearly to avoid over supplementation.

## 2022-05-12 NOTE — Assessment & Plan Note (Signed)
Hypertension Hypertension stable, reasonably well controlled, and BP at home normally < 120/80 She is following regularly with PCP/Cardiology . Reports BP at home today prior to visit 97/48, so did not take Cardizem today.  Medication(s): Olmesartan 20 mg daily   Cardizem CD 120 mg daily- holds if Systolic BP is < [id number]  Reports no side effects with medication.  BP Readings from Last 3 Encounters:  05/12/22 (!) 148/81  05/03/22 118/66  04/12/22 (!) 156/84   Lab Results  Component Value Date   CREATININE 0.86 12/29/2021   CREATININE 0.82 08/18/2021   CREATININE 0.77 06/22/2021   No results found for: "GFR"  Plan: Continue all antihypertensives at current dosages.Continue to work on nutrition plan to promote weight loss and improve BP control.

## 2022-05-12 NOTE — Progress Notes (Signed)
Office: [phone]  /  Fax: [phone]  WEIGHT SUMMARY AND BIOMETRICS  Vitals Temp: 56 F (36.7 C) BP: (!) 148/81 Pulse Rate: 80 SpO2: 98 %   Anthropometric Measurements Height: 5\' 1"  (1.549 m) Weight: 185 lb (83.9 kg) BMI (Calculated): 34.97 Weight at Last Visit: 192 lb Weight Lost Since Last Visit: 7 lb Weight Gained Since Last Visit: 0 lb Starting Weight: 212 lb   Body Composition  Body Fat %: 48 % Fat Mass (lbs): 89.2 lbs Muscle Mass (lbs): 91.6 lbs Total Body Water (lbs): 72 lbs   Other Clinical Data Fasting: Yes Labs: no Today's Visit #: 35 Starting Date: 03/26/19     HPI  Chief Complaint: OBESITY  Margaret Reese is here to discuss her progress with her obesity treatment plan. She is on the the Category 1 Plan and states she is following her eating plan approximately 50 % of the time. She states she is exercising/class/home workout 30-40 minutes 2-5 times per week.   Interval History:  Since last office visit she has done very well with weight loss.  She is down another 7 lbs since last visit.  She is working out at [employer] regularly doing low impact aerobics/chair aerobics. She did strain her hip/groin and is taking it easy on certain exercises/movements.  Her husband is also working on improving nutrition and more supportive of efforts. Her brother and his family are coming for a visit from [place]  and she is looking forward to the visit. She has been more active at home getting ready for the visit.   She is going to be starting a CPAP trial soon.  Hunger and appetite controlled overall when eating on plan, but still has cravings/emotional eating behaviors she is working hard to control.    Pharmacotherapy: metformin for hyperglycemia  PHYSICAL EXAM:  Blood pressure (!) 148/81, pulse 80, temperature 98 F (36.7 C), height 5\' 1"  (1.549 m), weight 185 lb (83.9 kg), SpO2 98 %. Body mass index is 34.96 kg/m.  General: She is overweight,  cooperative, alert, well developed, and in no acute distress. PSYCH: Has normal mood, affect and thought process.   Lungs: Normal breathing effort, no conversational dyspnea.  DIAGNOSTIC DATA REVIEWED:  BMET    Component Value Date/Time   NA 143 12/29/2021 1047   K 4.4 12/29/2021 1047   CL 103 12/29/2021 1047   CO2 27 12/29/2021 1047   GLUCOSE 82 12/29/2021 1047   GLUCOSE 90 11/08/2016 1035   BUN 15 12/29/2021 1047   CREATININE 0.86 12/29/2021 1047   CREATININE 0.96 (H) 11/08/2016 1035   CALCIUM 10.4 (H) 12/29/2021 1047   GFRNONAA 68 02/26/2020 1045   GFRAA 79 02/26/2020 1045   Lab Results  Component Value Date   HGBA1C 5.4 12/29/2021   HGBA1C 5.6 03/26/2019   Lab Results  Component Value Date   INSULIN 6.2 12/29/2021   INSULIN 8.1 03/26/2019   Lab Results  Component Value Date   TSH 2.470 12/29/2021   CBC    Component Value Date/Time   WBC 5.3 12/29/2021 1047   WBC 6.7 11/08/2016 1035   RBC 4.61 12/29/2021 1047   RBC 4.46 11/08/2016 1035   HGB 13.5 12/29/2021 1047   HCT 41.8 12/29/2021 1047   PLT 221 12/29/2021 1047   MCV 91 12/29/2021 1047   MCH 29.3 12/29/2021 1047   MCH 28.5 11/08/2016 1035   MCHC 32.3 12/29/2021 1047   MCHC 32.6 11/08/2016 1035   RDW 13.5 12/29/2021 1047  Iron Studies    Component Value Date/Time   IRON 64 03/26/2019 1502   TIBC 301 03/26/2019 1502   FERRITIN 260 (H) 03/26/2019 1502   IRONPCTSAT 21 03/26/2019 1502   Lipid Panel     Component Value Date/Time   CHOL 167 12/29/2021 1047   TRIG 55 12/29/2021 1047   HDL 84 12/29/2021 1047   CHOLHDL 2.2 06/22/2021 0956   CHOLHDL 2.6 11/08/2016 1035   VLDL 20 09/15/2015 0914   LDLCALC 72 12/29/2021 1047   LDLCALC 75 11/08/2016 1035   Hepatic Function Panel     Component Value Date/Time   PROT 7.0 12/29/2021 1047   ALBUMIN 4.9 (H) 12/29/2021 1047   AST 24 12/29/2021 1047   ALT 14 12/29/2021 1047   ALKPHOS 95 12/29/2021 1047   BILITOT 0.9 12/29/2021 1047      Component  Value Date/Time   TSH 2.470 12/29/2021 1047   Nutritional Lab Results  Component Value Date   VD25OH 27.5 (L) 12/29/2021   VD25OH 31.6 03/26/2019    ASSOCIATED CONDITIONS ADDRESSED TODAY  ASSESSMENT AND PLAN  Problem List Items Addressed This Visit     Essential hypertension    Hypertension Hypertension stable, reasonably well controlled, and BP at home normally < 120/80 She is following regularly with PCP/Cardiology . Reports BP at home today prior to visit 97/48, so did not take Cardizem today.  Medication(s): Olmesartan 20 mg daily   Cardizem CD 120 mg daily- holds if Systolic BP is < 123XX123  Reports no side effects with medication.  BP Readings from Last 3 Encounters:  05/12/22 (!) 148/81  05/03/22 118/66  04/12/22 (!) 156/84   Lab Results  Component Value Date   CREATININE 0.86 12/29/2021   CREATININE 0.82 08/18/2021   CREATININE 0.77 06/22/2021  No results found for: "GFR"  Plan: Continue all antihypertensives at current dosages.Continue to work on nutrition plan to promote weight loss and improve BP control.        Vitamin D deficiency - Primary    Vitamin D Deficiency Vitamin D is not at goal of 50.  Most recent vitamin D level was 27.5. She is on  prescription ergocalciferol 50,000 IU weekly. No side effects with Ergocalciferol.  Lab Results  Component Value Date   VD25OH 27.5 (L) 12/29/2021   VD25OH 31.6 03/26/2019   Plan: Refill prescription vitamin D 50,000 IU weekly. Recheck vit D level at least 2-3 times yearly to avoid over supplementation.        Relevant Medications   Vitamin D, Ergocalciferol, (DRISDOL) 1.25 MG (50000 UNIT) CAPS capsule   Hyperglycemia    Hyperglycemia Last A1c was 5.4/ Insulin 6.2 She is working on nutrition plan to decrease simple carbohydrates, increase lean proteins and exercise to promote weight loss, improve glycemic control and prevent progression to Type 2 diabetes.    Medication(s): Metformin 500 mg daily at  lunch Lab Results  Component Value Date   HGBA1C 5.4 12/29/2021   HGBA1C 5.6 03/26/2019   Lab Results  Component Value Date   INSULIN 6.2 12/29/2021   INSULIN 8.1 03/26/2019   Plan: Continue and refill metformin 500 mg at lunch daily.  Continue working on nutrition plan to decrease simple carbohydrates, increase lean proteins and exercise to promote weight loss, improve glycemic control and prevent progression to Type 2 diabetes.         Relevant Medications   metFORMIN (GLUCOPHAGE) 500 MG tablet   Obesity ([place])- Start BMI 40.06   Relevant Medications  metFORMIN (GLUCOPHAGE) 500 MG tablet   BMI 35.0-35.9,adult Current BMI 35.1      TREATMENT PLAN FOR OBESITY:  Recommended Dietary Goals  Joshlynn is currently in the action stage of change. As such, her goal is to continue weight management plan. She has agreed to the Category 1 Plan.  Behavioral Intervention  We discussed the following Behavioral Modification Strategies today: increasing lean protein intake, decreasing simple carbohydrates , increasing vegetables, increasing water intake, work on meal planning and easy cooking plans, planning for success, and keeping healthy foods at home.  Additional resources provided today: NA  Recommended Physical Activity Goals  Reeva has been advised to work up to 150 minutes of moderate intensity aerobic activity a week and strengthening exercises 2-3 times per week for cardiovascular health, weight loss maintenance and preservation of muscle mass.   She has agreed to Continue current level of physical activity    Pharmacotherapy We discussed various medication options to help Patte with her weight loss efforts and we both agreed to continue to take metformin for hyperglycemia and to work on her nutrition plan.    Return in about 4 weeks (around 06/09/2022).Marland Kitchen She was informed of the importance of frequent follow up visits to maximize her success with intensive lifestyle  modifications for her multiple health conditions.   ATTESTASTION STATEMENTS:  Reviewed by clinician on day of visit: allergies, medications, problem list, medical history, surgical history, family history, social history, and previous encounter notes.   I have personally spent 37 minutes total time today in preparation, patient care, nutritional counseling and documentation for this visit, including the following: review of clinical lab tests; review of medical tests/procedures/services.      Orval Dortch, PA-C

## 2022-05-12 NOTE — Assessment & Plan Note (Addendum)
Hyperglycemia Last A1c was 5.4/ Insulin 6.2 She is working on nutrition plan to decrease simple carbohydrates, increase lean proteins and exercise to promote weight loss, improve glycemic control and prevent progression to Type 2 diabetes.    Medication(s): Metformin 500 mg daily at lunch. No side effects with metformin.  Lab Results  Component Value Date   HGBA1C 5.4 12/29/2021   HGBA1C 5.6 03/26/2019   Lab Results  Component Value Date   INSULIN 6.2 12/29/2021   INSULIN 8.1 03/26/2019    Plan: Continue and refill metformin 500 mg at lunch daily.  Continue working on nutrition plan to decrease simple carbohydrates, increase lean proteins and exercise to promote weight loss, improve glycemic control and prevent progression to Type 2 diabetes.

## 2022-05-18 ENCOUNTER — Other Ambulatory Visit: Payer: Self-pay | Admitting: Adult Health

## 2022-05-25 ENCOUNTER — Other Ambulatory Visit (INDEPENDENT_AMBULATORY_CARE_PROVIDER_SITE_OTHER): Payer: Self-pay | Admitting: Physician Assistant

## 2022-05-25 DIAGNOSIS — R739 Hyperglycemia, unspecified: Secondary | ICD-10-CM

## 2022-05-28 DIAGNOSIS — I10 Essential (primary) hypertension: Secondary | ICD-10-CM | POA: Diagnosis not present

## 2022-06-06 ENCOUNTER — Telehealth: Payer: Self-pay

## 2022-06-06 ENCOUNTER — Encounter: Payer: Self-pay | Admitting: Cardiology

## 2022-06-06 ENCOUNTER — Ambulatory Visit: Payer: Medicare Other | Admitting: Cardiology

## 2022-06-06 VITALS — BP 130/72 | HR 74 | Resp 18 | Ht 61.0 in | Wt 189.0 lb

## 2022-06-06 DIAGNOSIS — I10 Essential (primary) hypertension: Secondary | ICD-10-CM

## 2022-06-06 DIAGNOSIS — Z45018 Encounter for adjustment and management of other part of cardiac pacemaker: Secondary | ICD-10-CM | POA: Diagnosis not present

## 2022-06-06 DIAGNOSIS — R0602 Shortness of breath: Secondary | ICD-10-CM | POA: Diagnosis not present

## 2022-06-06 DIAGNOSIS — I441 Atrioventricular block, second degree: Secondary | ICD-10-CM | POA: Diagnosis not present

## 2022-06-06 DIAGNOSIS — Z6835 Body mass index (BMI) 35.0-35.9, adult: Secondary | ICD-10-CM | POA: Diagnosis not present

## 2022-06-06 DIAGNOSIS — E782 Mixed hyperlipidemia: Secondary | ICD-10-CM

## 2022-06-06 DIAGNOSIS — Z950 Presence of cardiac pacemaker: Secondary | ICD-10-CM | POA: Diagnosis not present

## 2022-06-06 NOTE — Progress Notes (Signed)
Margaret Reese Date of Birth: 1945-07-26 MRN: [id number] Primary Care Provider:Lalonde, Everardo All, MD Former Cardiology Providers: Dr. Algie Coffer, Dr. Sharyn Lull, Dr. Jacinto Halim, and Altamese Laingsburg, APRN, FNP-C. Primary Cardiologist: Tessa Lerner, DO, [facility] (established care 07/25/2019) Electrophysiologist: Dr. Loman Brooklyn  Date: 06/06/22 Last Office Visit: 09/13/2021  Chief Complaint  Patient presents with   Shortness of Breath   Follow-up    9 months    HPI  Margaret Reese is a 77 y.o.  female whose past medical history and cardiovascular risk factors include: Sleep apnea on CPAP, Asthma, Hypertension, secondary AV block status post pacemaker, hyperlipidemia, postmenopausal female, advanced age, obesity.  Patient is being followed by the practice for pacemaker management due to second-degree AV block and hypertension.  At the last office visit she had concerns for shortness of breath.  Echocardiogram noted preserved LVEF with some progression in the severity of tricuspid regurgitation and mild pulmonary hypertension.  She was recommended to be on Bumex to help facilitate diuresis.  However, she goes to a weight management program whom measured her fluid levels which were within acceptable limits and therefore Bumex was not initiated.  She did follow-up with sleep medicine and is diagnosed with sleep apnea and currently on device therapy.  She also followed up with pulmonary medicine who diagnosed her with asthma and is currently on inhalers.  From a shortness of breath standpoint she is doing well when compared to her last office visit.  Her home blood pressures are actually well-controlled with SBP <120 mmHg.  Overall functional capacity remains stable.  She denies anginal chest pain or heart failure symptoms.  ALLERGIES: Allergies  Allergen Reactions   Toprol Xl [Metoprolol Tartrate]     Depressive mood.      MEDICATION LIST PRIOR TO VISIT: Current Outpatient Medications on File  Prior to Visit  Medication Sig Dispense Refill   acetaminophen (TYLENOL) 325 MG tablet Take 1-2 tablets (325-650 mg total) by mouth every 4 (four) hours as needed for mild pain.     albuterol (VENTOLIN HFA) 108 (90 Base) MCG/ACT inhaler Inhale 1-2 puffs into the lungs every 6 (six) hours as needed for wheezing or shortness of breath.     aspirin 81 MG tablet Take 81 mg by mouth daily.     diltiazem (CARDIZEM CD) 120 MG 24 hr capsule TAKE 1 CAPSULE BY MOUTH DAILY. HOLD IF BLOOD PRESSURE NUMBER IS LESS THAN 100 MMHG 90 capsule 1   fluticasone furoate-vilanterol (BREO ELLIPTA) 100-25 MCG/ACT AEPB INHALE 1 PUFF INTO THE LUNGS DAILY 60 each 6   loratadine (CLARITIN) 10 MG tablet Take 10 mg by mouth daily as needed for allergies.     metFORMIN (GLUCOPHAGE) 500 MG tablet TAKE 1 TABLET BY MOUTH WITH LUNCH 30 tablet 0   Multiple Vitamins-Minerals (PRESERVISION AREDS 2) CAPS Take 1 capsule by mouth 2 (two) times daily.      olmesartan (BENICAR) 20 MG tablet TAKE 1 TABLET BY MOUTH EVERY DAY 90 tablet 0   potassium citrate (UROCIT-K) 10 MEQ (1080 MG) SR tablet Take 10 mEq by mouth 2 (two) times daily.     simvastatin (ZOCOR) 20 MG tablet Take 1 tablet (20 mg total) by mouth every evening. 90 tablet 3   Vitamin D, Ergocalciferol, (DRISDOL) 1.25 MG (50000 UNIT) CAPS capsule Take 1 capsule (50,000 Units total) by mouth every 7 (seven) days. 4 capsule 0   No current facility-administered medications on file prior to visit.    PAST MEDICAL HISTORY: Past Medical  History:  Diagnosis Date   Allergy    Anxiety    Arthritis    Asthma    Complication of anesthesia    pt states has difficulty awakening; also has increased sinus drainage   Constipation    Edema, lower extremity    Encounter for interrogation of cardiac pacemaker 09/05/2018   Falls    GERD (gastroesophageal reflux disease)    H/O back injury    History of bronchitis    History of colon polyps    Hypertension    Imbalance    Kidney cysts     pt states not sure which kidney does see kidney specialist yearly pt states every thing okay currently    Legally blind in right eye, as defined in [place]    Mobitz type II atrioventricular block 11/08/2016   Multiple gastric ulcers    Obesity    OSA (obstructive sleep apnea) 05/03/2022   Pacemaker S/P St Jude Medical Assurity MRI model [id number] 05/20/2016   Renal stone    Retinal vein occlusion    Shortness of breath    Sinus node dysfunction 12/16/2018   Tingling    left arm    Trace cataracts    Urinary frequency    Vitamin D deficiency     PAST SURGICAL HISTORY: Past Surgical History:  Procedure Laterality Date   CARDIOVASCULAR STRESS TEST  dec 2016   CHOLECYSTECTOMY     COLONOSCOPY  2011   EYE SURGERY Bilateral    Cataract surgery.    LEFT HEART CATH AND CORONARY ANGIOGRAPHY N/A 05/19/2016   Procedure: Left Heart Cath and Coronary Angiography;  Surgeon: Orpah Cobb, MD;  Location: [facility];  Service: Cardiovascular;  Laterality: N/A;   PACEMAKER IMPLANT N/A 05/20/2016   Procedure: Pacemaker Implant;  Surgeon: Will Jorja Loa, MD;  Location: [facility] CV [facility];  Service: Cardiovascular;  Laterality: N/A;   TONSILLECTOMY     TOTAL HIP ARTHROPLASTY Right 01/30/2015   Procedure: RIGHT TOTAL HIP ARTHROPLASTY ANTERIOR APPROACH AND REMOVAL LIPOMA RIGHT HIP;  Surgeon: Kathryne Hitch, MD;  Location: [facility];  Service: Orthopedics;  Laterality: Right;   UPPER GI ENDOSCOPY      FAMILY HISTORY: The patient's family history includes Asthma in her brother; Bipolar disorder in her mother; Depression in her mother; Eating disorder in her mother; Kidney disease in her mother; Liver disease in her mother; Obesity in her mother; Stroke in her mother.   SOCIAL HISTORY:  The patient  reports that she has never smoked. She has never used smokeless tobacco. She reports that she does not drink alcohol and does not use drugs.  Review of Systems  Cardiovascular:  Negative for  chest pain, dyspnea on exertion, leg swelling, palpitations and syncope.  Respiratory:  Positive for shortness of breath (improving).     PHYSICAL EXAM:    06/06/2022   10:26 AM 06/06/2022    9:31 AM 05/12/2022    8:00 AM  Vitals with BMI  Height  5\' 1"  5\' 1"   Weight  189 lbs 185 lbs  BMI  35.73 34.97  Systolic 130 171 782  Diastolic 72 61 81  Pulse 74 90 80   Physical Exam  Constitutional: No distress.  Age appropriate, hemodynamically stable.   Neck: No JVD present.  Cardiovascular: Normal rate, regular rhythm, S1 normal, S2 normal, intact distal pulses and normal pulses. Exam reveals no gallop, no S3 and no S4.  No murmur heard. Pulmonary/Chest: Effort normal and breath sounds  normal. No stridor. She has no wheezes. She has no rales.  Pacemaker site is clean dry and intact (left infraclavicular region).   Abdominal: Soft. Bowel sounds are normal. She exhibits no distension. There is no abdominal tenderness.  Musculoskeletal:        General: No edema.     Cervical back: Neck supple.  Neurological: She is alert and oriented to person, place, and time. She has intact cranial nerves (2-12).  Skin: Skin is warm and moist.   CARDIAC DATABASE: Pacemaker in situ: St Jude Medical Assurity MRI  dual-chamber pacemaker for symptomatic bradycardia in March 2018  Remote dual-chamber pacemaker transmission 03/07/2022: Longevity 3 years and 7 months.  AP 28%, VP 99%. Lead impedance and thresholds are normal.  There were 11 mode switches, brief AT, longest 26 seconds.  Normal pacemaker function.   Scheduled  In office pacemaker check 01/05/22  Single (S)/Dual (D)/BV: D. Presenting ASVP. Programmed to DDD 60/120 Pacemaker dependant:  Yes. Underlying Complete heart block. AP 24%, VP 100%.  AMS Episodes 2.   Brief AT  HVR 0.  Longevity 3.8 Years. Magnet rate: >85%. Lead measurements: Stable. Histogram: Low (L)/normal (N)/high (H)  Normal. Patient activity Good.   Observations: Normal  pacemaker function. Changes: Turn on rate response and increased sensitivity to 4 from 2 to improve exercise tolerance.   EKG: June 06, 2022: Atrial sensed ventricular paced rhythm.  Echocardiogram: 08/22/2019: LVEF 55 to 60%, moderate LVH, grade 1 diastolic impairment, elevated LAP, pacemaker/ICD lead noted in the RV, mild MR, moderate TR, mild pulmonary hypertension with RVSP 39 mmHg.  09/01/2021: Left ventricle cavity is normal in size. Moderate concentric hypertrophy of the left ventricle. Abnormal septal wall motion due to left bundle branch block. Normal LV systolic function with EF 57%. Indeterminate diastolic filling pattern.  Mild to moderate tricuspid regurgitation. RVSP measures 41 mmHg. No significant change compared to previous study on 08/22/2019.  Stress Testing:  January 23, 2015:Myocardial perfusion imaging is normal. Overall left ventricular systolic function was normal without regional wall motion abnormalities. The left ventricular ejection fraction was 78%.  This is a normal/low risk study.  Heart Catheterization: 04/2016 Dr. Orpah Cobb: Normal coronaries  AMBULATORY BLOOD PRESSURE AND WEIGHT MONITORING: 30-d averages Systolic (AM)   mmHg  --           110.0 (98.0 - 128.0)     Diastolic (AM)  mmHg  --           56.0 (46.0 - 67.0)  Heart Rate (AM)          bpm     --           75.3 (68.0 - 84.0)           Systolic (PM)   mmHg  --                       115.5 (113.0 - 118.0)   Diastolic (PM)  mmHg  --                       57.0 (56.0 - 58.0)         Heart Rate (PM)          bpm     --                      71.5 (71.0 - 72.0)   06/06/22 5:20 AM  115      /           59        70                                 06/05/22 8:01 AM         107      /           52        68                     06/04/22 5:54 AM         103      /           54        75                                 06/03/22 5:22 AM         107      /           46        80                                  06/02/22 5:49 AM         101      /           48        77                                 06/01/22 6:03 AM         103      /           51        77                                 05/31/22 7:33 AM           110      /           55        77                                 05/30/22 8:13 AM           105      /           56        71                                 05/29/22 7:18 AM           102      /           57        71  05/28/22 9:47 AM           106      /           52        83                                 05/27/22 12:03 PM         113      /           58        72                                 05/26/22 7:39 AM           116      /           58        75                                 05/25/22 6:12 AM           120      /           66        69                                 05/24/22 12:33 PM         118      /           56        71    LABORATORY DATA:    Latest Ref Rng & Units 12/29/2021   10:47 AM 06/22/2021    9:56 AM 06/01/2020    3:41 PM  CBC  WBC 3.4 - 10.8 x10E3/uL 5.3  4.9  5.9   Hemoglobin 11.1 - 15.9 g/dL 16.1  09.6  04.5   Hematocrit 34.0 - 46.6 % 41.8  40.7  40.6   Platelets 150 - 450 x10E3/uL 221  206  221        Latest Ref Rng & Units 12/29/2021   10:47 AM 08/19/2021    1:40 PM 08/18/2021   10:55 AM  CMP  Glucose 70 - 99 mg/dL 82   90   BUN 8 - 27 mg/dL 15   15   Creatinine 4.09 - 1.00 mg/dL 8.11   9.14   Sodium 782 - 144 mmol/L 143   143   Potassium 3.5 - 5.2 mmol/L 4.4   4.6   Chloride 96 - 106 mmol/L 103   102   CO2 20 - 29 mmol/L 27   26   Calcium 8.7 - 10.3 mg/dL 95.6  9.9  21.3   Total Protein 6.0 - 8.5 g/dL 7.0     Total Bilirubin 0.0 - 1.2 mg/dL 0.9     Alkaline Phos 44 - 121 IU/L 95     AST 0 - 40 IU/L 24     ALT 0 - 32 IU/L 14       Lipid Panel  Lab Results  Component Value Date   CHOL 167 12/29/2021   HDL 84 12/29/2021   LDLCALC 72 12/29/2021   TRIG  55 12/29/2021   CHOLHDL 2.2 06/22/2021     Lab Results   Component Value Date   HGBA1C 5.4 12/29/2021   HGBA1C 5.6 03/26/2019   No components found for: "NTPROBNP" Lab Results  Component Value Date   TSH 2.470 12/29/2021   TSH 3.360 03/26/2019   TSH 1.360 05/19/2016    Cardiac Panel (last 3 results) No results for input(s): "CKTOTAL", "CKMB", "TROPONINIHS", "RELINDX" in the last 72 hours.  External Labs: Collected: 08/18/2021 provided by PCP Sodium 143, potassium 4.6, chloride 102, bicarb 26. BUN 15, creatinine 0.82. BNP 33.9 Magnesium 2.2  IMPRESSION:    ICD-10-CM   1. Shortness of breath  R06.02 EKG 12-Lead    2. Benign hypertension  I10     3. Mixed hyperlipidemia  E78.2     4. Pacemaker S/P St Jude Medical Assurity MRI model [id number]  Z95.0 EKG 12-Lead    5. Mobitz type 2 second degree AV block  I44.1 EKG 12-Lead    6. Class 2 severe obesity due to excess calories with serious comorbidity and body mass index (BMI) of 35.0 to 35.9 in adult  E66.01    Z68.35        RECOMMENDATIONS: ISIS ZISK is a 77 y.o. female whose past medical history and cardiovascular risk factors include: Sleep apnea on CPAP, Asthma, Hypertension, secondary AV block status post pacemaker, hyperlipidemia, postmenopausal female, advanced age, obesity..  Shortness of breath: Improving Has not required the initiation of Bumex.  Will discontinue Bumex. Home blood pressures are well-controlled-ambulatory blood pressure readings reviewed as part of today's office visit. Has been diagnosed with sleep apnea since last office visit-she is using the device regularly and getting used to it. Has followed up with pulmonary medicine and is being treated for asthma. Monitor for now.  Encounter for interrogation of cardiac pacemaker / S/P St Jude Medical Assurity MRI model [id number] / Mobitz type 2 second degree AV block:  Last office interrogation from November 2023 reviewed. Last remote monitoring from April 2024 independently reviewed. Results reviewed  and noted above.  Benign hypertension Blood pressure at today's office visit is not well controlled. Home blood pressures are well controlled. Medications reconciled. In the past she has not done well with Toprol-XL. If she feels tired and fatigue will back off on blood pressure pills  Mixed hyperlipidemia Currently on simvastatin.   She denies myalgia or other side effects. Most recent lipids dated November 2023, independently reviewed as noted above.  LDL at acceptable levels. Currently managed by primary care provider.  Class 2 obesity due to excess calories with serious comorbidity and body mass index (BMI) of 35.0 to 35.9 in adult Body mass index is 35.71 kg/m. Gained 2 pounds since last office visit. We will uptitrate diuretic therapy as discussed above. We emphasized the importance of a low-salt diet. Patient is attributing her weight gain with both the increased salt and not exercising regularly.   FINAL MEDICATION LIST END OF ENCOUNTER: No orders of the defined types were placed in this encounter.    There are no discontinued medications.    Current Outpatient Medications:    acetaminophen (TYLENOL) 325 MG tablet, Take 1-2 tablets (325-650 mg total) by mouth every 4 (four) hours as needed for mild pain., Disp: , Rfl:    albuterol (VENTOLIN HFA) 108 (90 Base) MCG/ACT inhaler, Inhale 1-2 puffs into the lungs every 6 (six) hours as needed for wheezing or shortness of breath., Disp: , Rfl:    aspirin 81 MG  tablet, Take 81 mg by mouth daily., Disp: , Rfl:    diltiazem (CARDIZEM CD) 120 MG 24 hr capsule, TAKE 1 CAPSULE BY MOUTH DAILY. HOLD IF BLOOD PRESSURE NUMBER IS LESS THAN 100 MMHG, Disp: 90 capsule, Rfl: 1   fluticasone furoate-vilanterol (BREO ELLIPTA) 100-25 MCG/ACT AEPB, INHALE 1 PUFF INTO THE LUNGS DAILY, Disp: 60 each, Rfl: 6   loratadine (CLARITIN) 10 MG tablet, Take 10 mg by mouth daily as needed for allergies., Disp: , Rfl:    metFORMIN (GLUCOPHAGE) 500 MG  tablet, TAKE 1 TABLET BY MOUTH WITH LUNCH, Disp: 30 tablet, Rfl: 0   Multiple Vitamins-Minerals (PRESERVISION AREDS 2) CAPS, Take 1 capsule by mouth 2 (two) times daily. , Disp: , Rfl:    olmesartan (BENICAR) 20 MG tablet, TAKE 1 TABLET BY MOUTH EVERY DAY, Disp: 90 tablet, Rfl: 0   potassium citrate (UROCIT-K) 10 MEQ (1080 MG) SR tablet, Take 10 mEq by mouth 2 (two) times daily., Disp: , Rfl:    simvastatin (ZOCOR) 20 MG tablet, Take 1 tablet (20 mg total) by mouth every evening., Disp: 90 tablet, Rfl: 3   Vitamin D, Ergocalciferol, (DRISDOL) 1.25 MG (50000 UNIT) CAPS capsule, Take 1 capsule (50,000 Units total) by mouth every 7 (seven) days., Disp: 4 capsule, Rfl: 0  Orders Placed This Encounter  Procedures   EKG 12-Lead   --Continue cardiac medications as reconciled in final medication list. --Return in about 7 months (around 01/13/2023) for Follow up, Dyspnea. Or sooner if needed. --Continue follow-up with your primary care physician regarding the management of your other chronic comorbid conditions.  Patient's questions and concerns were addressed to her satisfaction. She voices understanding of the instructions provided during this encounter.   This note was created using a voice recognition software as a result there may be grammatical errors inadvertently enclosed that do not reflect the nature of this encounter. Every attempt is made to correct such errors.  Tessa Lerner, [place], [facility]  Pager:  [phone] Office: [phone]

## 2022-06-06 NOTE — Telephone Encounter (Signed)
Patient home blood pressure is well controlled on current antihypertensive regimen. Readings can be soft. Patient denies hypotensive symptoms and reports to drink about 48oz of water daily.   30-d averages Systolic (AM) mmHg --  110.0 (16.1 - 128.0)   Diastolic (AM) mmHg --  56.0 (46.0 - 67.0)  Heart Rate (AM) bpm --  75.3 (68.0 - 84.0)     Systolic (PM) mmHg --   115.5 (113.0 - 118.0)   Diastolic (PM) mmHg --   57.0 (09.6 - 58.0)   Heart Rate (PM) bpm --  71.5 (71.0 - 72.0)  06/06/22 5:20 AM 115 / 59 70     06/05/22 8:01 AM 107 / 52 68   06/04/22 5:54 AM 103 / 54 75     06/03/22 5:22 AM 107 / 46 80     06/02/22 5:49 AM 101 / 48 77     06/01/22 6:03 AM 103 / 51 77     05/31/22 7:33 AM 110 / 55 77     05/30/22 8:13 AM 105 / 56 71     05/29/22 7:18 AM 102 / 57 71     05/28/22 9:47 AM 106 / 52 83     05/27/22 12:03 PM 113 / 58 72     05/26/22 7:39 AM 116 / 58 75     05/25/22 6:12 AM 120 / 66 69     05/24/22 12:33 PM 118 / 56 71

## 2022-06-11 ENCOUNTER — Other Ambulatory Visit (INDEPENDENT_AMBULATORY_CARE_PROVIDER_SITE_OTHER): Payer: Self-pay | Admitting: Physician Assistant

## 2022-06-11 DIAGNOSIS — E559 Vitamin D deficiency, unspecified: Secondary | ICD-10-CM

## 2022-06-12 ENCOUNTER — Other Ambulatory Visit: Payer: Self-pay | Admitting: Family Medicine

## 2022-06-12 DIAGNOSIS — E785 Hyperlipidemia, unspecified: Secondary | ICD-10-CM

## 2022-06-14 ENCOUNTER — Ambulatory Visit (INDEPENDENT_AMBULATORY_CARE_PROVIDER_SITE_OTHER): Payer: Medicare Other | Admitting: Physician Assistant

## 2022-06-14 ENCOUNTER — Encounter (INDEPENDENT_AMBULATORY_CARE_PROVIDER_SITE_OTHER): Payer: Self-pay | Admitting: Physician Assistant

## 2022-06-14 VITALS — BP 143/80 | HR 85 | Temp 97.7°F | Ht 61.0 in | Wt 188.0 lb

## 2022-06-14 DIAGNOSIS — E559 Vitamin D deficiency, unspecified: Secondary | ICD-10-CM | POA: Diagnosis not present

## 2022-06-14 DIAGNOSIS — E669 Obesity, unspecified: Secondary | ICD-10-CM

## 2022-06-14 DIAGNOSIS — F439 Reaction to severe stress, unspecified: Secondary | ICD-10-CM | POA: Diagnosis not present

## 2022-06-14 DIAGNOSIS — Z6835 Body mass index (BMI) 35.0-35.9, adult: Secondary | ICD-10-CM | POA: Diagnosis not present

## 2022-06-14 DIAGNOSIS — R739 Hyperglycemia, unspecified: Secondary | ICD-10-CM | POA: Diagnosis not present

## 2022-06-14 MED ORDER — VITAMIN D (ERGOCALCIFEROL) 1.25 MG (50000 UNIT) PO CAPS: 50000.0000 [IU] | ORAL_CAPSULE | ORAL | 0 refills | Status: DC

## 2022-06-14 MED ORDER — METFORMIN HCL 500 MG PO TABS: ORAL_TABLET | ORAL | 0 refills | Status: DC

## 2022-06-14 NOTE — Assessment & Plan Note (Signed)
Her stress level is increased lately as her husband may be showing some signs of early dementia and they are going to the [place] today for further assessment and she will hopefully have some answers as to what is going on and strategies to help deal with these concerns. Discussed medications to help manage stress, but she would like to hold off starting any additional medications at this time.  Plan: Continue to work on strategies to help manage stress/emotional eating/comfort eating.  Discussed possibility of working with Dr. Dewaine Conger on emotional eating behaviors, but she is unable to use her computer for telemedicine visits.  Will continue to monitor and consider referral for in person visit next visit.

## 2022-06-14 NOTE — Assessment & Plan Note (Signed)
Hyperglycemia Last A1c was 5.4/ Insulin 6.2  Medication(s):  metformin 500 mg daily with lunch. No GI side effects with metformin.  She is working on Engineer, technical sales to decrease simple carbohydrates, increase lean proteins and exercise to promote weight loss, improve glycemic control and prevent progression to Type 2 diabetes.   Lab Results  Component Value Date   HGBA1C 5.4 12/29/2021   HGBA1C 5.6 03/26/2019   Lab Results  Component Value Date   INSULIN 6.2 12/29/2021   INSULIN 8.1 03/26/2019    Plan: Continue and refill  metformin 500 mg daily at lunch.  Continue working on nutrition plan to decrease simple carbohydrates, increase lean proteins and exercise to promote weight loss, improve glycemic control and prevent progression to Type 2 diabetes.

## 2022-06-14 NOTE — Assessment & Plan Note (Signed)
Vitamin D Deficiency Vitamin D is not at goal of 50.  Most recent vitamin D level was 27.5. She is on  prescription ergocalciferol 50,000 IU weekly. Lab Results  Component Value Date   VD25OH 27.5 (L) 12/29/2021   VD25OH 31.6 03/26/2019    Plan: Continue and refill  prescription ergocalciferol 50,000 IU weekly Low vitamin D levels can be associated with adiposity and may result in leptin resistance and weight gain. Also associated with fatigue. Currently on vitamin D supplementation without any adverse effects.  Plan to recheck Vitamin D level next visit.

## 2022-06-14 NOTE — Progress Notes (Signed)
Office: [phone]  /  Fax: [phone]  WEIGHT SUMMARY AND BIOMETRICS  Vitals Temp: 97.7 F (36.5 C) BP: (!) 143/80 Pulse Rate: 85 SpO2: 97 %   Anthropometric Measurements Height:  (1.549 m) Weight: 188 lb (85.3 kg) BMI (Calculated): 35.54 Weight at Last Visit: 185 lb Weight Lost Since Last Visit: 0 lb Weight Gained Since Last Visit: 3 lb Starting Weight: 212 lb Total Weight Loss (lbs): 24 lb (10.9 kg)   Body Composition  Body Fat %: 48.8 % Fat Mass (lbs): 91.8 lbs Muscle Mass (lbs): 91.4 lbs Total Body Water (lbs): 75.4 lbs Visceral Fat Rating : 16   Other Clinical Data Fasting: yes Labs: no Today's Visit #: 36 Starting Date: 03/26/19     HPI  Chief Complaint: OBESITY  Margaret Reese is here to discuss her progress with her obesity treatment plan. She is on the the Category 1 Plan and states she is following her eating plan approximately 40 % of the time. She states she is exercising/class 20-40 minutes 6 times per week.   Interval History:  Since last office visit she is up 3 pounds following a vacation with her husband. Eating out frequently on vacation, but she is getting back on track with her nutrition plan. Exercised regularly even on vacation 20 to 30 minutes each day. On CPAP for OSA x several weeks -trying to get used to the CPAP machine.  Reports her breathing is significantly improved with Breo and this has improved her exercise tolerance. Overall trying to make better choices but continues to have difficulties with cravings and will occasionally give into her cravings and we discussed strategies to continue to help with management of emotional eating.  Stress- level is increased lately as her husband may be showing some signs of early dementia.  Cardiology F/U- Dr. Tessa Lerner 06/06/22:  Pacemaker management due to second-degree AV block and hypertension. Stable.  Last 2DEcho 09/01/2021: Left ventricle cavity is normal in size. Moderate  concentric hypertrophy of the left ventricle. Abnormal septal wall motion due to left bundle branch block. Normal LV systolic function with EF 57%. Indeterminate diastolic filling pattern.   Pharmacotherapy: Metformin for hyperglycemia.  No GI side effects with metformin.  PHYSICAL EXAM:  Blood pressure (!) 143/80, pulse 85, temperature 97.7 F (36.5 C), height  (1.549 m), weight 188 lb (85.3 kg), SpO2 97 %. Body mass index is 35.52 kg/m.  General: She is overweight, cooperative, alert, well developed, and in no acute distress. PSYCH: Has normal mood, affect and thought process.   Cardiovascular; HR 80's regular, some mild ankle edema today following recent vacation, eating out more than usual. BP elevated in office, but BP at home this am 113/48 per patient Lungs: Normal breathing effort, no conversational dyspnea. Neuro: no focal deficits  DIAGNOSTIC DATA REVIEWED:  BMET    Component Value Date/Time   NA 143 12/29/2021 1047   K 4.4 12/29/2021 1047   CL 103 12/29/2021 1047   CO2 27 12/29/2021 1047   GLUCOSE 82 12/29/2021 1047   GLUCOSE 90 11/08/2016 1035   BUN 15 12/29/2021 1047   CREATININE 0.86 12/29/2021 1047   CREATININE 0.96 (H) 11/08/2016 1035   CALCIUM 10.4 (H) 12/29/2021 1047   GFRNONAA 68 02/26/2020 1045   GFRAA 79 02/26/2020 1045   Lab Results  Component Value Date   HGBA1C 5.4 12/29/2021   HGBA1C 5.6 03/26/2019   Lab Results  Component Value Date   INSULIN 6.2 12/29/2021   INSULIN 8.1 03/26/2019  Lab Results  Component Value Date   TSH 2.470 12/29/2021   CBC    Component Value Date/Time   WBC 5.3 12/29/2021 1047   WBC 6.7 11/08/2016 1035   RBC 4.61 12/29/2021 1047   RBC 4.46 11/08/2016 1035   HGB 13.5 12/29/2021 1047   HCT 41.8 12/29/2021 1047   PLT 221 12/29/2021 1047   MCV 91 12/29/2021 1047   MCH 29.3 12/29/2021 1047   MCH 28.5 11/08/2016 1035   MCHC 32.3 12/29/2021 1047   MCHC 32.6 11/08/2016 1035   RDW 13.5 12/29/2021 1047    Iron Studies    Component Value Date/Time   IRON 64 03/26/2019 1502   TIBC 301 03/26/2019 1502   FERRITIN 260 (H) 03/26/2019 1502   IRONPCTSAT 21 03/26/2019 1502   Lipid Panel     Component Value Date/Time   CHOL 167 12/29/2021 1047   TRIG 55 12/29/2021 1047   HDL 84 12/29/2021 1047   CHOLHDL 2.2 06/22/2021 0956   CHOLHDL 2.6 11/08/2016 1035   VLDL 20 09/15/2015 0914   LDLCALC 72 12/29/2021 1047   LDLCALC 75 11/08/2016 1035   Hepatic Function Panel     Component Value Date/Time   PROT 7.0 12/29/2021 1047   ALBUMIN 4.9 (H) 12/29/2021 1047   AST 24 12/29/2021 1047   ALT 14 12/29/2021 1047   ALKPHOS 95 12/29/2021 1047   BILITOT 0.9 12/29/2021 1047      Component Value Date/Time   TSH 2.470 12/29/2021 1047   Nutritional Lab Results  Component Value Date   VD25OH 27.5 (L) 12/29/2021   VD25OH 31.6 03/26/2019    ASSOCIATED CONDITIONS ADDRESSED TODAY  ASSESSMENT AND PLAN  Problem List Items Addressed This Visit     Vitamin D deficiency    Vitamin D Deficiency Vitamin D is not at goal of 50.  Most recent vitamin D level was 27.5. She is on  prescription ergocalciferol 50,000 IU weekly. Lab Results  Component Value Date   VD25OH 27.5 (L) 12/29/2021   VD25OH 31.6 03/26/2019   Plan: Continue and refill  prescription ergocalciferol 50,000 IU weekly Low vitamin D levels can be associated with adiposity and may result in leptin resistance and weight gain. Also associated with fatigue. Currently on vitamin D supplementation without any adverse effects.  Plan to recheck Vitamin D level next visit.        Relevant Medications   Vitamin D, Ergocalciferol, (DRISDOL) 1.25 MG (50000 UNIT) CAPS capsule   Hyperglycemia - Primary    Hyperglycemia Last A1c was 5.4/ Insulin 6.2  Medication(s):  metformin 500 mg daily with lunch. No GI side effects with metformin.  She is working on Engineer, technical sales to decrease simple carbohydrates, increase lean proteins and exercise to  promote weight loss, improve glycemic control and prevent progression to Type 2 diabetes.   Lab Results  Component Value Date   HGBA1C 5.4 12/29/2021   HGBA1C 5.6 03/26/2019   Lab Results  Component Value Date   INSULIN 6.2 12/29/2021   INSULIN 8.1 03/26/2019   Plan: Continue and refill  metformin 500 mg daily at lunch.  Continue working on nutrition plan to decrease simple carbohydrates, increase lean proteins and exercise to promote weight loss, improve glycemic control and prevent progression to Type 2 diabetes.         Relevant Medications   metFORMIN (GLUCOPHAGE) 500 MG tablet   Obesity (HCC)- Start BMI 40.06   Relevant Medications   metFORMIN (GLUCOPHAGE) 500 MG tablet   BMI 35.0-35.9,adult  Current BMI 35.1   Stress    Her stress level is increased lately as her husband may be showing some signs of early dementia and they are going to the [place] today for further assessment and she will hopefully have some answers as to what is going on and strategies to help deal with these concerns. Discussed medications to help manage stress, but she would like to hold off starting any additional medications at this time.  Plan: Continue to work on strategies to help manage stress/emotional eating/comfort eating.  Discussed possibility of working with Dr. Dewaine Conger on emotional eating behaviors, but she is unable to use her computer for telemedicine visits.  Will continue to monitor and consider referral for in person visit next visit.       TREATMENT PLAN FOR OBESITY:  Recommended Dietary Goals  Jessice is currently in the action stage of change. As such, her goal is to continue weight management plan. She has agreed to the Category 1 Plan.  Behavioral Intervention  We discussed the following Behavioral Modification Strategies today: increasing lean protein intake, decreasing simple carbohydrates , increasing vegetables, increasing lower glycemic fruits, increasing water intake, emotional  eating strategies and understanding the difference between hunger signals and cravings, avoiding temptations and identifying enticing environmental cues, and planning for success.  Additional resources provided today: NA  Recommended Physical Activity Goals  Maryjayne has been advised to work up to 150 minutes of moderate intensity aerobic activity a week and strengthening exercises 2-3 times per week for cardiovascular health, weight loss maintenance and preservation of muscle mass.   She has agreed to Continue current level of physical activity    Pharmacotherapy We discussed various medication options to help Maisee with her weight loss efforts and we both agreed to continue metformin for hyperglycemia and continue to work on nutritional and behavioral strategies to promote weight loss.     Return in about 4 weeks (around 07/12/2022).Marland Kitchen She was informed of the importance of frequent follow up visits to maximize her success with intensive lifestyle modifications for her multiple health conditions.   ATTESTASTION STATEMENTS:  Reviewed by clinician on day of visit: allergies, medications, problem list, medical history, surgical history, family history, social history, and previous encounter notes.   I have personally spent 44 minutes total time today in preparation, patient care, nutritional counseling and documentation for this visit, including the following: review of clinical lab tests; review of medical tests/procedures/services.      Margaret Monsivais, PA-C

## 2022-06-16 ENCOUNTER — Other Ambulatory Visit (INDEPENDENT_AMBULATORY_CARE_PROVIDER_SITE_OTHER): Payer: Self-pay | Admitting: Physician Assistant

## 2022-06-16 DIAGNOSIS — R739 Hyperglycemia, unspecified: Secondary | ICD-10-CM

## 2022-06-25 ENCOUNTER — Other Ambulatory Visit: Payer: Self-pay | Admitting: Cardiology

## 2022-06-25 DIAGNOSIS — I10 Essential (primary) hypertension: Secondary | ICD-10-CM

## 2022-06-27 DIAGNOSIS — I10 Essential (primary) hypertension: Secondary | ICD-10-CM | POA: Diagnosis not present

## 2022-06-28 ENCOUNTER — Encounter: Payer: Self-pay | Admitting: Family Medicine

## 2022-06-28 ENCOUNTER — Ambulatory Visit (INDEPENDENT_AMBULATORY_CARE_PROVIDER_SITE_OTHER): Payer: Medicare Other | Admitting: Family Medicine

## 2022-06-28 VITALS — BP 148/70 | HR 86 | Temp 98.1°F | Resp 16 | Ht 60.75 in | Wt 194.0 lb

## 2022-06-28 DIAGNOSIS — G4733 Obstructive sleep apnea (adult) (pediatric): Secondary | ICD-10-CM

## 2022-06-28 DIAGNOSIS — F32A Depression, unspecified: Secondary | ICD-10-CM

## 2022-06-28 DIAGNOSIS — M1990 Unspecified osteoarthritis, unspecified site: Secondary | ICD-10-CM | POA: Diagnosis not present

## 2022-06-28 DIAGNOSIS — K219 Gastro-esophageal reflux disease without esophagitis: Secondary | ICD-10-CM | POA: Diagnosis not present

## 2022-06-28 DIAGNOSIS — I272 Pulmonary hypertension, unspecified: Secondary | ICD-10-CM

## 2022-06-28 DIAGNOSIS — F325 Major depressive disorder, single episode, in full remission: Secondary | ICD-10-CM

## 2022-06-28 DIAGNOSIS — I495 Sick sinus syndrome: Secondary | ICD-10-CM

## 2022-06-28 DIAGNOSIS — I5032 Chronic diastolic (congestive) heart failure: Secondary | ICD-10-CM | POA: Diagnosis not present

## 2022-06-28 DIAGNOSIS — J301 Allergic rhinitis due to pollen: Secondary | ICD-10-CM | POA: Diagnosis not present

## 2022-06-28 DIAGNOSIS — I442 Atrioventricular block, complete: Secondary | ICD-10-CM | POA: Diagnosis not present

## 2022-06-28 DIAGNOSIS — F439 Reaction to severe stress, unspecified: Secondary | ICD-10-CM

## 2022-06-28 DIAGNOSIS — I10 Essential (primary) hypertension: Secondary | ICD-10-CM

## 2022-06-28 DIAGNOSIS — J4530 Mild persistent asthma, uncomplicated: Secondary | ICD-10-CM

## 2022-06-28 DIAGNOSIS — J4520 Mild intermittent asthma, uncomplicated: Secondary | ICD-10-CM

## 2022-06-28 NOTE — Progress Notes (Signed)
Margaret Reese is a 77 y.o. female who presents for annual wellness visit and follow-up on chronic medical conditions.  She has OSA and is on CPAP.  She is in the process of getting used to this but has noted an improvement in her energy.  She also was diagnosed with asthma and is on Breo and using the rescue inhaler.  Apparently the albuterol does cause some slight irritation.  She [facility]  She has a history of vitamin D deficiency with her most recent vitamin D level in the insufficient range.  She is followed by cardiology and does have a pacer.  Continues on simvastatin and is having no difficulty with that.  She is also taking olmesartan and diltiazem without trouble.  Her allergies are under good control. She is having no reflux symptoms.  She has been under less stress with her husband now showing some signs of cognitive dysfunction.  He is apparently scheduled to be seen by neurology in July.  She has gotten some help from the social worker from the [place] on ways to handle the situation.  She does have a previous history of depression but seems to be handling this fairly well.  She notes some slight difficulty with arthritis mainly in her hands. Immunizations and Health Maintenance Immunization History  Administered Date(s) Administered   Influenza Split 11/07/2011, 12/01/2012   Influenza Whole 01/11/2006, 11/28/2008, 11/30/2010   Influenza-Unspecified 12/08/2014, 12/07/2015, 11/21/2016, 11/21/2017, 11/16/2018, 11/03/2020   PFIZER(Purple Top)SARS-COV-2 Vaccination 04/07/2019, 04/30/2019, 10/24/2019   Pfizer Covid-19 Vaccine Bivalent Booster 26yrs & up 12/25/2020   Pneumococcal Conjugate-13 09/15/2015   Pneumococcal Polysaccharide-23 09/07/2004, 09/11/2012   Td 09/17/2004   Tdap 06/01/2015   Zoster, Live 12/16/2009   Health Maintenance Due  Topic Date Due   Zoster Vaccines- Shingrix (1 of 2) Never done   COVID-19 Vaccine (5 -  2023-24 season) 10/22/2021   Medicare Annual Wellness (AWV)  06/23/2022    Last Pap smear: n/a Last mammogram: 04/26/2022 Last colonoscopy: 2016 Last DEXA: 2016 Dentist: within the last year Ophtho: more than 1 year ago Exercise: exercise class 30-40 minutes daily  Other doctors caring for patient include: Cardiology- Dr. Odis Hollingshead Pulmonology- Tammy Parrett Urology- Gaynelle Arabian  Advanced directives: Yes. On file    Depression screen:  See questionnaire below.     06/28/2022    8:19 AM 06/22/2021    8:26 AM 06/22/2021    8:22 AM 06/01/2020    2:34 PM 08/21/2019   10:59 AM  Depression screen PHQ 2/9  Decreased Interest 0 0 0 0 0  Down, Depressed, Hopeless 0 0 0 0 1  PHQ - 2 Score 0 0 0 0 1  Altered sleeping 1 0     Tired, decreased energy 1 1     Change in appetite 1 3     Feeling bad or failure about yourself  0 0     Trouble concentrating 0 0     Moving slowly or fidgety/restless 1 0     Suicidal thoughts 0 0     PHQ-9 Score 4 4     Difficult doing work/chores Not difficult at all Not difficult at all       Fall Risk Screen: see questionnaire below.    06/28/2022    8:20 AM 06/22/2021    8:21 AM 06/01/2020    2:33 PM 08/21/2019   10:59 AM 11/20/2018    8:17 AM  Fall Risk  Falls in the past year? 0 0 0 0 0  Number falls in past yr: 0 0 0    Injury with Fall? 0 0 0    Risk for fall due to : No Fall Risks No Fall Risks No Fall Risks    Follow up Falls evaluation completed Falls evaluation completed Falls evaluation completed      ADL screen:  See questionnaire below Functional Status Survey: Is the patient deaf or have difficulty hearing?: No Does the patient have difficulty seeing, even when wearing glasses/contacts?: No Does the patient have difficulty concentrating, remembering, or making decisions?: No Does the patient have difficulty walking or climbing stairs?: Yes Does the patient have difficulty dressing or bathing?: No Does the patient have difficulty doing  errands alone such as visiting a doctor's office or shopping?: No   Review of Systems Constitutional: -, -unexpected weight change, -anorexia, -fatigue Allergy: -sneezing, -itching, -congestion Dermatology: denies changing moles, rash, lumps ENT: -runny nose, -ear pain, -sore throat,  Cardiology:  -chest pain, -palpitations, -orthopnea, Respiratory: -cough, -shortness of breath, -dyspnea on exertion, -wheezing,  Gastroenterology: -abdominal pain, -nausea, -vomiting, -diarrhea, -constipation, -dysphagia Hematology: -bleeding or bruising problems Musculoskeletal: -arthralgias, -myalgias, -joint swelling, -back pain, - Ophthalmology: -vision changes,  Urology: -dysuria, -difficulty urinating,  -urinary frequency, -urgency, incontinence Neurology: -, -numbness, , -memory loss, -falls, -dizziness    PHYSICAL EXAM:   BP (!) 148/70 (BP Location: Left Arm, Cuff Size: Large)   Pulse 86   Temp 98.1 F (36.7 C) (Oral)   Resp 16   Ht 5' 0.75" (1.543 m)   Wt 194 lb (88 kg)   SpO2 97% Comment: room air  BMI 36.96 kg/m   General Appearance: Alert, cooperative, no distress, appears stated age Head: Normocephalic, without obvious abnormality, atraumatic Eyes: PERRL, conjunctiva/corneas clear, EOM's intact,  Ears: Normal TM's and external ear canals Nose: Nares normal, mucosa normal, no drainage or sinus tenderness Throat: Lips, mucosa, and tongue normal; teeth and gums normal Neck: Supple, no lymphadenopathy;  thyroid:  no enlargement/tenderness/nodules; no carotid bruit or JVD Lungs: Clear to auscultation bilaterally without wheezes, rales or ronchi; respirations unlabored Heart: Regular rate and rhythm, S1 and S2 normal, no murmur, rubor gallop  Skin:  Skin color, texture, turgor normal, no rashes or lesions Lymph nodes: Cervical, supraclavicular, and axillary nodes normal Neurologic:  CNII-XII intact, normal strength, sensation and gait; reflexes 2+ and symmetric throughout Psych:  Normal mood, affect, hygiene and grooming.  ASSESSMENT/PLAN: Pulmonary hypertension (HCC)  Heart block AV complete (HCC)  Essential hypertension  Chronic diastolic (congestive) heart failure (HCC)  OSA (obstructive sleep apnea)  Mild persistent asthma without complication  Gastroesophageal reflux disease without esophagitis  Allergic rhinitis due to pollen, unspecified seasonality  Obesity (HCC)- Start BMI 40.06  Depression, major, in remission (HCC)  Arthritis  Stress    Discussed monthly self breast exams and yearly mammograms; at l vitamin D intake and .  Immunization recommendations discussed.  She is to have the pharmacy send over her immunizations.  Colonoscopy recommendations reviewed Did give information concerning behavioral health in case she runs into trouble with her husband before being seen in July  Medicare Attestation I have personally reviewed: The patient's medical and social history Their use of alcohol, tobacco or illicit drugs Their current medications and supplements The patient's functional ability including ADLs,fall risks, home safety risks, cognitive, and hearing and visual impairment Diet and physical activities Evidence for depression or mood disorders  The patient's weight, height, and BMI have been  recorded in the chart.  I have made referrals, counseling, and provided education to the patient based on review of the above and I have provided the patient with a written personalized care plan for preventive services.   Greater than 40 minutes spent discussing all these issues with her.  Sharlot Gowda, MD   06/28/2022

## 2022-07-11 ENCOUNTER — Other Ambulatory Visit (INDEPENDENT_AMBULATORY_CARE_PROVIDER_SITE_OTHER): Payer: Self-pay | Admitting: Physician Assistant

## 2022-07-11 DIAGNOSIS — E559 Vitamin D deficiency, unspecified: Secondary | ICD-10-CM

## 2022-07-14 ENCOUNTER — Ambulatory Visit (INDEPENDENT_AMBULATORY_CARE_PROVIDER_SITE_OTHER): Payer: Medicare Other | Admitting: Physician Assistant

## 2022-07-15 DIAGNOSIS — J019 Acute sinusitis, unspecified: Secondary | ICD-10-CM | POA: Diagnosis not present

## 2022-07-15 DIAGNOSIS — R059 Cough, unspecified: Secondary | ICD-10-CM | POA: Diagnosis not present

## 2022-07-15 DIAGNOSIS — H6980 Other specified disorders of Eustachian tube, unspecified ear: Secondary | ICD-10-CM | POA: Diagnosis not present

## 2022-07-26 DIAGNOSIS — R5383 Other fatigue: Secondary | ICD-10-CM | POA: Diagnosis not present

## 2022-07-26 DIAGNOSIS — J189 Pneumonia, unspecified organism: Secondary | ICD-10-CM | POA: Diagnosis not present

## 2022-07-26 DIAGNOSIS — J45901 Unspecified asthma with (acute) exacerbation: Secondary | ICD-10-CM | POA: Diagnosis not present

## 2022-07-26 DIAGNOSIS — R0600 Dyspnea, unspecified: Secondary | ICD-10-CM | POA: Diagnosis not present

## 2022-07-26 DIAGNOSIS — R051 Acute cough: Secondary | ICD-10-CM | POA: Diagnosis not present

## 2022-07-27 ENCOUNTER — Telehealth: Payer: Self-pay

## 2022-07-27 DIAGNOSIS — I10 Essential (primary) hypertension: Secondary | ICD-10-CM | POA: Diagnosis not present

## 2022-07-27 NOTE — Telephone Encounter (Signed)
Is being treated for pneumonia. Her hr has been running high 90's - 111. She is taking AZITHROMYCIN 250MG  TABLETS 6-PAK  and doxycycline (VIBRA-TABS) 100 MG tablet. She waiting for Xopenex inhaler to be approved by her insurance. Should she be concerned about her hr?

## 2022-07-28 NOTE — Telephone Encounter (Signed)
Infection can cause heart rate to be elevated. As long as she remains asymptomatic we will continue to monitor.  Kaden Daughdrill Kaufman, DO, [facility]

## 2022-08-04 ENCOUNTER — Ambulatory Visit (INDEPENDENT_AMBULATORY_CARE_PROVIDER_SITE_OTHER): Payer: Medicare Other | Admitting: Physician Assistant

## 2022-08-04 ENCOUNTER — Encounter (INDEPENDENT_AMBULATORY_CARE_PROVIDER_SITE_OTHER): Payer: Self-pay | Admitting: Physician Assistant

## 2022-08-04 VITALS — BP 147/77 | HR 88 | Temp 98.1°F | Ht 61.0 in | Wt 194.0 lb

## 2022-08-04 DIAGNOSIS — Z6836 Body mass index (BMI) 36.0-36.9, adult: Secondary | ICD-10-CM

## 2022-08-04 DIAGNOSIS — E669 Obesity, unspecified: Secondary | ICD-10-CM | POA: Diagnosis not present

## 2022-08-04 DIAGNOSIS — R632 Polyphagia: Secondary | ICD-10-CM

## 2022-08-04 DIAGNOSIS — E559 Vitamin D deficiency, unspecified: Secondary | ICD-10-CM

## 2022-08-04 MED ORDER — VITAMIN D (ERGOCALCIFEROL) 1.25 MG (50000 UNIT) PO CAPS
50000.0000 [IU] | ORAL_CAPSULE | ORAL | 0 refills | Status: DC
Start: 2022-08-04 — End: 2022-09-08

## 2022-08-04 NOTE — Progress Notes (Signed)
.smr  Office: [phone]  /  Fax: [phone]  WEIGHT SUMMARY AND BIOMETRICS  Vitals Temp: 98.1 F (36.7 C) BP: (!) 147/77 Pulse Rate: 88 SpO2: 96 %   Anthropometric Measurements Height: 5\' 1"  (1.549 m) Weight: 194 lb (88 kg) BMI (Calculated): 36.67 Weight at Last Visit: 188 lb Weight Lost Since Last Visit: 0 lb Weight Gained Since Last Visit: 6 lb Starting Weight: 212 lb   Body Composition  Body Fat %: 50.2 % Fat Mass (lbs): 97.4 lbs Muscle Mass (lbs): 91.8 lbs Total Body Water (lbs): 77 lbs Visceral Fat Rating : 17   Other Clinical Data Fasting: yes Labs: no Today's Visit #: 37 Starting Date: 03/26/19     HPI  Chief Complaint: OBESITY  Markala is here to discuss her progress with her obesity treatment plan. She is on the the Category 1 Plan and states she is following her eating plan approximately 0 % of the time. She states she is exercising 0 minutes 0 times per week.   Interval History:  Since last office visit she up 6 pounds She has been struggling with infections.  She has most recently been treated for pneumonia and has been very limited as far as her activity level. She is working to get back on track with her nutrition plan Hunger/appetite-reports poor appetite/was comfort eating while sick for the past month Cravings-increased for things like ice cream Stress-increased risk of recent illness Sleep-she is using CPAP and does have follow-up with pulmonary.  She is doing better with her nasal mask and wearing it more consistently for longer periods Exercise-very limited due to recent bout with pneumonia which is finally clearing.  She is hopeful to get back to some regular walking.  She saw Dr. Susann Givens her PCP in May, but did not have fasting labs done at that time.  We discussed obtaining some fasting labs next visit.    Pharmacotherapy: Metformin for polyphagia /hyperglycemia  TREATMENT PLAN FOR OBESITY:  Recommended Dietary Goals  Tanysha  is currently in the action stage of change. As such, her goal is to continue weight management plan. She has agreed to the Category 1 Plan.  Behavioral Intervention  We discussed the following Behavioral Modification Strategies today: increasing lean protein intake, decreasing simple carbohydrates , increasing vegetables, increasing lower glycemic fruits, avoiding skipping meals, increasing water intake, work on meal planning and preparation, decreasing eating out or consumption of processed foods, and making healthy choices when eating convenient foods, emotional eating strategies and understanding the difference between hunger signals and cravings, work on managing stress, creating time for self-care and relaxation measures, continue to practice mindfulness when eating, and planning for success.  Additional resources provided today: NA  Recommended Physical Activity Goals  Nancye has been advised to work up to 150 minutes of moderate intensity aerobic activity a week and strengthening exercises 2-3 times per week for cardiovascular health, weight loss maintenance and preservation of muscle mass.   She has agreed to Continue current level of physical activity  and Think about ways to increase daily physical activity and overcoming barriers to exercise   Pharmacotherapy We discussed various medication options to help Kailana with her weight loss efforts and we both agreed to continue metformin for polyphagia/hyperglycemia.    Return in about 4 weeks (around 09/01/2022).Marland Kitchen She was informed of the importance of frequent follow up visits to maximize her success with intensive lifestyle modifications for her multiple health conditions.  PHYSICAL EXAM:  Blood pressure (!) 147/77, pulse 88,  temperature 98.1 F (36.7 C), height 5\' 1"  (1.549 m), weight 194 lb (88 kg), SpO2 96 %. Body mass index is 36.66 kg/m.  General: She is overweight, cooperative, alert, well developed, and in no acute  distress. PSYCH: Has normal mood, affect and thought process.   Cardiovascular: Heart rate 80s, BP 147/77 Lungs: Normal breathing effort, no conversational dyspnea.   DIAGNOSTIC DATA REVIEWED:  BMET    Component Value Date/Time   NA 143 12/29/2021 1047   K 4.4 12/29/2021 1047   CL 103 12/29/2021 1047   CO2 27 12/29/2021 1047   GLUCOSE 82 12/29/2021 1047   GLUCOSE 90 11/08/2016 1035   BUN 15 12/29/2021 1047   CREATININE 0.86 12/29/2021 1047   CREATININE 0.96 (H) 11/08/2016 1035   CALCIUM 10.4 (H) 12/29/2021 1047   GFRNONAA 68 02/26/2020 1045   GFRAA 79 02/26/2020 1045   Lab Results  Component Value Date   HGBA1C 5.4 12/29/2021   HGBA1C 5.6 03/26/2019   Lab Results  Component Value Date   INSULIN 6.2 12/29/2021   INSULIN 8.1 03/26/2019   Lab Results  Component Value Date   TSH 2.470 12/29/2021   CBC    Component Value Date/Time   WBC 5.3 12/29/2021 1047   WBC 6.7 11/08/2016 1035   RBC 4.61 12/29/2021 1047   RBC 4.46 11/08/2016 1035   HGB 13.5 12/29/2021 1047   HCT 41.8 12/29/2021 1047   PLT 221 12/29/2021 1047   MCV 91 12/29/2021 1047   MCH 29.3 12/29/2021 1047   MCH 28.5 11/08/2016 1035   MCHC 32.3 12/29/2021 1047   MCHC 32.6 11/08/2016 1035   RDW 13.5 12/29/2021 1047   Iron Studies    Component Value Date/Time   IRON 64 03/26/2019 1502   TIBC 301 03/26/2019 1502   FERRITIN 260 (H) 03/26/2019 1502   IRONPCTSAT 21 03/26/2019 1502   Lipid Panel     Component Value Date/Time   CHOL 167 12/29/2021 1047   TRIG 55 12/29/2021 1047   HDL 84 12/29/2021 1047   CHOLHDL 2.2 06/22/2021 0956   CHOLHDL 2.6 11/08/2016 1035   VLDL 20 09/15/2015 0914   LDLCALC 72 12/29/2021 1047   LDLCALC 75 11/08/2016 1035   Hepatic Function Panel     Component Value Date/Time   PROT 7.0 12/29/2021 1047   ALBUMIN 4.9 (H) 12/29/2021 1047   AST 24 12/29/2021 1047   ALT 14 12/29/2021 1047   ALKPHOS 95 12/29/2021 1047   BILITOT 0.9 12/29/2021 1047      Component Value  Date/Time   TSH 2.470 12/29/2021 1047   Nutritional Lab Results  Component Value Date   VD25OH 27.5 (L) 12/29/2021   VD25OH 31.6 03/26/2019    ASSOCIATED CONDITIONS ADDRESSED TODAY  ASSESSMENT AND PLAN  Problem List Items Addressed This Visit     Polyphagia - Primary   Vitamin D deficiency   Relevant Medications   Vitamin D, Ergocalciferol, (DRISDOL) 1.25 MG (50000 UNIT) CAPS capsule   Obesity (HCC)- Start BMI 40.06   BMI 36.0-36.9,adult   Polyphagia Currently this is moderately controlled. Medication(s):  Metformin 500 mg daily with lunch meal .  Reported side effects: None She is working on nutrition plan to decrease simple carbohydrates, increase lean proteins and exercise to promote weight loss, improve glycemic control and prevent progression to Type 2 diabetes.   Plan: Continue and refill metformin 500 mg daily with lunch meal, #90. Continue working on nutrition plan to decrease simple carbohydrates, increase lean proteins and exercise  to promote weight loss, improve glycemic control and prevent progression to Type 2 diabetes.    Vitamin D Deficiency Vitamin D is not at goal of 50.  Most recent vitamin D level was 27.5. She is on  prescription ergocalciferol 50,000 IU weekly. Lab Results  Component Value Date   VD25OH 27.5 (L) 12/29/2021   VD25OH 31.6 03/26/2019    Plan: Continue and refill  prescription ergocalciferol 50,000 IU weekly Low vitamin D levels can be associated with adiposity and may result in leptin resistance and weight gain. Also associated with fatigue. Currently on vitamin D supplementation without any adverse effects.  Recheck vitamin D level next visit.    ATTESTASTION STATEMENTS:  Reviewed by clinician on day of visit: allergies, medications, problem list, medical history, surgical history, family history, social history, and previous encounter notes.   I have personally spent 41 minutes total time today in preparation, patient care,  nutritional counseling and documentation for this visit, including the following: review of clinical lab tests; review of medical tests/procedures/services.      Chloeann Alfred, PA-C

## 2022-08-18 DIAGNOSIS — N200 Calculus of kidney: Secondary | ICD-10-CM | POA: Diagnosis not present

## 2022-08-18 DIAGNOSIS — N281 Cyst of kidney, acquired: Secondary | ICD-10-CM | POA: Diagnosis not present

## 2022-09-01 ENCOUNTER — Encounter: Payer: Self-pay | Admitting: Adult Health

## 2022-09-01 ENCOUNTER — Ambulatory Visit: Payer: Medicare Other

## 2022-09-01 ENCOUNTER — Ambulatory Visit (INDEPENDENT_AMBULATORY_CARE_PROVIDER_SITE_OTHER): Payer: Medicare Other | Admitting: Adult Health

## 2022-09-01 VITALS — BP 152/84 | HR 83 | Temp 97.7°F | Ht 61.0 in | Wt 201.2 lb

## 2022-09-01 DIAGNOSIS — J189 Pneumonia, unspecified organism: Secondary | ICD-10-CM

## 2022-09-01 DIAGNOSIS — J301 Allergic rhinitis due to pollen: Secondary | ICD-10-CM | POA: Diagnosis not present

## 2022-09-01 DIAGNOSIS — G4733 Obstructive sleep apnea (adult) (pediatric): Secondary | ICD-10-CM | POA: Diagnosis not present

## 2022-09-01 DIAGNOSIS — Z950 Presence of cardiac pacemaker: Secondary | ICD-10-CM | POA: Diagnosis not present

## 2022-09-01 DIAGNOSIS — J4530 Mild persistent asthma, uncomplicated: Secondary | ICD-10-CM

## 2022-09-01 NOTE — Assessment & Plan Note (Signed)
Mild persistent asthma seems to be at good control currently.  Recent sinusitis and pneumonia.  Resolved after antibiotics.  Chest x-ray to follow-up pneumonia today.  Continue with trigger prevention  Plan  Patient Instructions  Chest xray today to follow up Pneumonia  Continue BREO 1 puff daily , rinse after use.  Albuterol inhaler As needed   Asthma action plan as discussed.  Saline nasal spray rinse Twice daily   Saline nasal gel At bedtime  (AYR gel)  Claritin 5mg  At bedtime As needed   Activity as tolerated  Continue on CPAP At bedtime  , goal is to wear all night long for at least 6hr or more each night .  Work on healthy weight loss  Healthy sleep regimen  Do not drive if sleepy  Follow up in 4-6  months with Dr. Wynona Neat or [place] Searles NP and As needed   Please contact office for sooner follow up if symptoms do not improve or worsen or seek emergency care

## 2022-09-01 NOTE — Assessment & Plan Note (Signed)
Mild obstructive sleep apnea.  Patient is doing well on CPAP.  Does have some perceived benefit.  Continue on current settings.  If mask continues to have issues recommend a mask fitting.  Patient will call back for referral for mask fitting if needed  Plan  Patient Instructions  Chest xray today to follow up Pneumonia  Continue BREO 1 puff daily , rinse after use.  Albuterol inhaler As needed   Asthma action plan as discussed.  Saline nasal spray rinse Twice daily   Saline nasal gel At bedtime  (AYR gel)  Claritin 5mg  At bedtime As needed   Activity as tolerated  Continue on CPAP At bedtime  , goal is to wear all night long for at least 6hr or more each night .  Work on healthy weight loss  Healthy sleep regimen  Do not drive if sleepy  Follow up in 4-6  months with Dr. Wynona Neat or Laqueshia Cihlar NP and As needed   Please contact office for sooner follow up if symptoms do not improve or worsen or seek emergency care

## 2022-09-01 NOTE — Assessment & Plan Note (Signed)
Recent pneumonia diagnosed last month.  Clinically is improved after antibiotics.  She will need a follow-up x-ray for clearance

## 2022-09-01 NOTE — Assessment & Plan Note (Signed)
Continue on current maintenance regimen 

## 2022-09-01 NOTE — Progress Notes (Signed)
@Patient  ID: Margaret Reese, female    DOB: 1945/05/02, 77 y.o.   MRN: [id number]  Chief Complaint  Patient presents with   Follow-up    Referring provider: Ronnald Nian, MD  HPI: 77 year old female never smoker seen for pulmonary consult September 20, 2021 for shortness of breath with activity found to have mild persistent asthma.  Complaints of snoring and daytime sleepiness found to have mild obstructive sleep apnea Medical history significant for hypertension, AV block status post pacemaker  TEST/EVENTS :  08/22/2019: LVEF 55 to 60%, moderate LVH, grade 1 diastolic impairment, elevated LAP, pacemaker/ICD lead noted in the RV, mild MR, moderate TR, mild pulmonary hypertension with RVSP 39 mmHg.   09/01/2021: Left ventricle cavity is normal in size. Moderate concentric hypertrophy of the left ventricle. Abnormal septal wall motion due to left bundle branch block. Normal LV systolic function with EF 57%. Indeterminate diastolic filling pattern.  Mild to moderate tricuspid regurgitation. RVSP measures 41 mmHg. No significant change compared to previous study on 08/22/2019.   Stress Testing:  January 23, 2015:Myocardial perfusion imaging is normal. Overall left ventricular systolic function was normal without regional wall motion abnormalities. The left ventricular ejection fraction was 78%.  This is a normal/low risk study.   Heart Catheterization: 04/2016 Dr. Orpah Cobb: Normal coronaries   Jun 22, 2021 absolute eosinophil count 200    moderate airflow obstruction with significant reversibility FEV1 at 73%, ratio 69, FVC 78%, positive bronchodilator change (21% change) and DLCO at 131%.   09/01/2022 Follow up ; Asthma , Allergic Rhinitis , OSA Patient returns for a 63-month follow-up.  Last visit patient was started on CPAP for mild sleep apnea.  Patient says she is trying to get used to her CPAP.  She has tried a couple different mask currently has a smaller nasal pillows which seems  to be working the best.  She says she does feel when the mask is not leaking that she is benefit from the CPAP.  CPAP download shows excellent compliance with 100% usage.  Daily average usage at 6 hours.  AHI 0.7/hour.  Patient has underlying mild persistent asthma and allergic rhinitis.  She says overall breathing was doing well except for last month that she got sick with a sinus infection that turned into pneumonia.  She was seen at urgent care and started on antibiotics.  She says she is doing better now.  Cough and congestion have resolved.  She remains on Breo daily.  She denies any hemoptysis or fever.   Allergies  Allergen Reactions   Toprol Xl [Metoprolol Tartrate]     Depressive mood.     Immunization History  Administered Date(s) Administered   Influenza Split 11/07/2011, 12/01/2012   Influenza Whole 01/11/2006, 11/28/2008, 11/30/2010   Influenza-Unspecified 12/08/2014, 12/07/2015, 11/21/2016, 11/21/2017, 11/16/2018, 11/03/2020, 01/15/2022   PFIZER(Purple Top)SARS-COV-2 Vaccination 04/07/2019, 04/30/2019, 10/24/2019   Pfizer Covid-19 Vaccine Bivalent Booster 75yrs & up 12/25/2020   Pneumococcal Conjugate-13 09/15/2015   Pneumococcal Polysaccharide-23 09/07/2004, 09/11/2012   Td 09/17/2004   Tdap 06/01/2015   Zoster, Live 12/16/2009    Past Medical History:  Diagnosis Date   Allergy    Anxiety    Arthritis    Asthma    Complication of anesthesia    pt states has difficulty awakening; also has increased sinus drainage   Constipation    Edema, lower extremity    Encounter for interrogation of cardiac pacemaker 09/05/2018   Falls    GERD (gastroesophageal reflux disease)  H/O back injury    History of bronchitis    History of colon polyps    Hypertension    Imbalance    Kidney cysts    pt states not sure which kidney does see kidney specialist yearly pt states every thing okay currently    Legally blind in right eye, as defined in [place]    Mobitz type II  atrioventricular block 11/08/2016   Multiple gastric ulcers    Obesity    OSA (obstructive sleep apnea) 05/03/2022   Pacemaker S/P [employer] MRI model [id number] 05/20/2016   Renal stone    Retinal vein occlusion    Shortness of breath    Sinus node dysfunction (HCC) 12/16/2018   Tingling    left arm    Trace cataracts    Urinary frequency    Vitamin D deficiency     Tobacco History: Social History   Tobacco Use  Smoking Status Never  Smokeless Tobacco Never   Counseling given: Not Answered   Outpatient Medications Prior to Visit  Medication Sig Dispense Refill   albuterol (VENTOLIN HFA) 108 (90 Base) MCG/ACT inhaler Inhale 1-2 puffs into the lungs every 6 (six) hours as needed for wheezing or shortness of breath.     aspirin 81 MG tablet Take 81 mg by mouth daily.     diltiazem (CARDIZEM CD) 120 MG 24 hr capsule TAKE 1 CAPSULE BY MOUTH DAILY. HOLD IF BLOOD PRESSURE NUMBER IS LESS THAN 100 MMHG 90 capsule 1   fluticasone furoate-vilanterol (BREO ELLIPTA) 100-25 MCG/ACT AEPB INHALE 1 PUFF INTO THE LUNGS DAILY 60 each 6   loratadine (CLARITIN) 10 MG tablet Take 10 mg by mouth daily as needed for allergies.     metFORMIN (GLUCOPHAGE) 500 MG tablet TAKE 1 TABLET BY MOUTH WITH LUNCH 90 tablet 0   Multiple Vitamins-Minerals (PRESERVISION AREDS 2) CAPS Take 1 capsule by mouth 2 (two) times daily.      olmesartan (BENICAR) 20 MG tablet TAKE 1 TABLET BY MOUTH EVERY DAY 90 tablet 0   potassium citrate (UROCIT-K) 10 MEQ (1080 MG) SR tablet Take 10 mEq by mouth 2 (two) times daily.     simvastatin (ZOCOR) 20 MG tablet TAKE 1 TABLET(20 MG) BY MOUTH EVERY EVENING 90 tablet 1   Vitamin D, Ergocalciferol, (DRISDOL) 1.25 MG (50000 UNIT) CAPS capsule Take 1 capsule (50,000 Units total) by mouth every 7 (seven) days. 4 capsule 0   No facility-administered medications prior to visit.     Review of Systems:   Constitutional:   No  weight loss, night sweats,  Fevers, chills, fatigue,  or  lassitude.  HEENT:   No headaches,  Difficulty swallowing,  Tooth/dental problems, or  Sore throat,                No sneezing, itching, ear ache,  +nasal congestion, post nasal drip,   CV:  No chest pain,  Orthopnea, PND, swelling in lower extremities, anasarca, dizziness, palpitations, syncope.   GI  No heartburn, indigestion, abdominal pain, nausea, vomiting, diarrhea, change in bowel habits, loss of appetite, bloody stools.   Resp:   No chest wall deformity  Skin: no rash or lesions.  GU: no dysuria, change in color of urine, no urgency or frequency.  No flank pain, no hematuria   MS:  No joint pain or swelling.  No decreased range of motion.  No back pain.    Physical Exam  BP (!) 152/84 (BP Location: Right Arm, Patient  Position: Sitting, Cuff Size: Normal)   Pulse 83   Temp 97.7 F (36.5 C) (Oral)   Ht 5\' 1"  (1.549 m)   Wt 201 lb 3.2 oz (91.3 kg)   SpO2 97%   BMI 38.02 kg/m   GEN: A/Ox3; pleasant , NAD, well nourished    HEENT:  [place]/AT,  NOSE-clear, THROAT-clear, no lesions, no postnasal drip or exudate noted.   NECK:  Supple w/ fair ROM; no JVD; normal carotid impulses w/o bruits; no thyromegaly or nodules palpated; no lymphadenopathy.    RESP  Clear  P & A; w/o, wheezes/ rales/ or rhonchi. no accessory muscle use, no dullness to percussion  CARD:  RRR, no m/r/g, no peripheral edema, pulses intact, no cyanosis or clubbing.  GI:   Soft & nt; nml bowel sounds; no organomegaly or masses detected.   Musco: Warm bil, no deformities or joint swelling noted.   Neuro: alert, no focal deficits noted.    Skin: Warm, no lesions or rashes    Lab Results:    BNP   Imaging: No results found.  Administration History     None          Latest Ref Rng & Units 11/19/2021    8:27 AM  PFT Results  FVC-Pre L 1.52   FVC-Predicted Pre % 67   FVC-Post L 1.77   FVC-Predicted Post % 78   Pre FEV1/FVC % % 67   Post FEV1/FCV % % 69   FEV1-Pre L 1.01    FEV1-Predicted Pre % 60   FEV1-Post L 1.23   DLCO uncorrected ml/min/mmHg 21.63   DLCO UNC% % 131   DLCO corrected ml/min/mmHg 21.63   DLCO COR %Predicted % 131   DLVA Predicted % 133   TLC L 5.11   TLC % Predicted % 114   RV % Predicted % 151     No results found for: "NITRICOXIDE"      Assessment & Plan:   Asthma Mild persistent asthma seems to be at good control currently.  Recent sinusitis and pneumonia.  Resolved after antibiotics.  Chest x-ray to follow-up pneumonia today.  Continue with trigger prevention  Plan  Patient Instructions  Chest xray today to follow up Pneumonia  Continue BREO 1 puff daily , rinse after use.  Albuterol inhaler As needed   Asthma action plan as discussed.  Saline nasal spray rinse Twice daily   Saline nasal gel At bedtime  (AYR gel)  Claritin 5mg  At bedtime As needed   Activity as tolerated  Continue on CPAP At bedtime  , goal is to wear all night long for at least 6hr or more each night .  Work on healthy weight loss  Healthy sleep regimen  Do not drive if sleepy  Follow up in 4-6  months with Dr. Wynona Neat or Braeton Wolgamott NP and As needed   Please contact office for sooner follow up if symptoms do not improve or worsen or seek emergency care     Allergic rhinitis due to pollen Continue on current maintenance regimen  OSA (obstructive sleep apnea) Mild obstructive sleep apnea.  Patient is doing well on CPAP.  Does have some perceived benefit.  Continue on current settings.  If mask continues to have issues recommend a mask fitting.  Patient will call back for referral for mask fitting if needed  Plan  Patient Instructions  Chest xray today to follow up Pneumonia  Continue BREO 1 puff daily , rinse after use.  Albuterol inhaler  As needed   Asthma action plan as discussed.  Saline nasal spray rinse Twice daily   Saline nasal gel At bedtime  (AYR gel)  Claritin 5mg  At bedtime As needed   Activity as tolerated  Continue on CPAP At  bedtime  , goal is to wear all night long for at least 6hr or more each night .  Work on healthy weight loss  Healthy sleep regimen  Do not drive if sleepy  Follow up in 4-6  months with Dr. Wynona Neat or Crystalle Popwell NP and As needed   Please contact office for sooner follow up if symptoms do not improve or worsen or seek emergency care     CAP (community acquired pneumonia) Recent pneumonia diagnosed last month.  Clinically is improved after antibiotics.  She will need a follow-up x-ray for clearance     Rubye Oaks, NP 09/01/2022

## 2022-09-01 NOTE — Patient Instructions (Addendum)
Chest xray today to follow up Pneumonia  Continue BREO 1 puff daily , rinse after use.  Albuterol inhaler As needed   Asthma action plan as discussed.  Saline nasal spray rinse Twice daily   Saline nasal gel At bedtime  (AYR gel)  Claritin 5mg  At bedtime As needed   Activity as tolerated  Continue on CPAP At bedtime  , goal is to wear all night long for at least 6hr or more each night .  Work on healthy weight loss  Healthy sleep regimen  Do not drive if sleepy  Follow up in 4-6  months with Dr. Wynona Neat or Tavionna Grout NP and As needed   Please contact office for sooner follow up if symptoms do not improve or worsen or seek emergency care

## 2022-09-02 ENCOUNTER — Other Ambulatory Visit (INDEPENDENT_AMBULATORY_CARE_PROVIDER_SITE_OTHER): Payer: Self-pay | Admitting: Physician Assistant

## 2022-09-02 DIAGNOSIS — E559 Vitamin D deficiency, unspecified: Secondary | ICD-10-CM

## 2022-09-05 DIAGNOSIS — Z45018 Encounter for adjustment and management of other part of cardiac pacemaker: Secondary | ICD-10-CM | POA: Diagnosis not present

## 2022-09-05 DIAGNOSIS — I441 Atrioventricular block, second degree: Secondary | ICD-10-CM | POA: Diagnosis not present

## 2022-09-06 NOTE — Telephone Encounter (Signed)
Error with previous note

## 2022-09-06 NOTE — Telephone Encounter (Signed)
Sent to pharmacy with correct instructions

## 2022-09-08 ENCOUNTER — Encounter (INDEPENDENT_AMBULATORY_CARE_PROVIDER_SITE_OTHER): Payer: Self-pay | Admitting: Physician Assistant

## 2022-09-08 ENCOUNTER — Ambulatory Visit (INDEPENDENT_AMBULATORY_CARE_PROVIDER_SITE_OTHER): Payer: Medicare Other | Admitting: Physician Assistant

## 2022-09-08 VITALS — BP 138/71 | HR 81 | Temp 98.0°F | Ht 61.0 in | Wt 192.0 lb

## 2022-09-08 DIAGNOSIS — E559 Vitamin D deficiency, unspecified: Secondary | ICD-10-CM

## 2022-09-08 DIAGNOSIS — E669 Obesity, unspecified: Secondary | ICD-10-CM

## 2022-09-08 DIAGNOSIS — I10 Essential (primary) hypertension: Secondary | ICD-10-CM | POA: Diagnosis not present

## 2022-09-08 DIAGNOSIS — R632 Polyphagia: Secondary | ICD-10-CM | POA: Diagnosis not present

## 2022-09-08 DIAGNOSIS — R5383 Other fatigue: Secondary | ICD-10-CM

## 2022-09-08 DIAGNOSIS — R7303 Prediabetes: Secondary | ICD-10-CM | POA: Diagnosis not present

## 2022-09-08 DIAGNOSIS — Z6836 Body mass index (BMI) 36.0-36.9, adult: Secondary | ICD-10-CM | POA: Diagnosis not present

## 2022-09-08 DIAGNOSIS — I5032 Chronic diastolic (congestive) heart failure: Secondary | ICD-10-CM

## 2022-09-08 DIAGNOSIS — R739 Hyperglycemia, unspecified: Secondary | ICD-10-CM

## 2022-09-08 DIAGNOSIS — E7800 Pure hypercholesterolemia, unspecified: Secondary | ICD-10-CM

## 2022-09-08 MED ORDER — METFORMIN HCL 500 MG PO TABS
ORAL_TABLET | ORAL | 0 refills | Status: DC
Start: 2022-09-08 — End: 2022-12-26

## 2022-09-08 MED ORDER — VITAMIN D (ERGOCALCIFEROL) 1.25 MG (50000 UNIT) PO CAPS
50000.0000 [IU] | ORAL_CAPSULE | ORAL | 0 refills | Status: DC
Start: 2022-09-08 — End: 2022-10-11

## 2022-09-08 NOTE — Progress Notes (Signed)
.smr  Office: [phone]  /  Fax: [phone]  WEIGHT SUMMARY AND BIOMETRICS  Vitals Temp: 98 F (36.7 C) BP: 138/71 Pulse Rate: 81 SpO2: 97 %   Anthropometric Measurements Height: 5\' 1"  (1.549 m) Weight: 192 lb (87.1 kg) BMI (Calculated): 36.3 Weight at Last Visit: 194lb Weight Lost Since Last Visit: 2lb Weight Gained Since Last Visit: 0 Starting Weight: 212lb Total Weight Loss (lbs): 20 lb (9.072 kg)   Body Composition  Body Fat %: 49.4 % Fat Mass (lbs): 95 lbs Muscle Mass (lbs): 92.4 lbs Total Body Water (lbs): 75 lbs Visceral Fat Rating : 16   Other Clinical Data Fasting: yes Labs: yes Today's Visit #: 33 Starting Date: 03/26/19     HPI  Chief Complaint: OBESITY  Kabrina is here to discuss her progress with her obesity treatment plan. She is on the the Category 1 Plan and states she is following her eating plan approximately 50 % of the time. She states she is exercising 2 classes /Other exercise 30-40 minutes 5 times per week.  Discussed the use of AI scribe software for clinical note transcription with the patient, who gave verbal consent to proceed.  History of Present Illness        The patient, a 77 year old female with a history of obesity, chronic diastolic heart failure, vitamin D deficiency, hypertension, and hypercholesterolemia, presents for a follow-up visit. She reports adherence to a category one nutrition plan about 50% of the time and exercises for 30 to 40 minutes, five times a week. Despite a period of three weeks where she ate anything she wanted, she has lost two pounds, which she found surprising.  The patient also has sleep apnea and has been using a sleep mask. She reports some difficulty with the mask, describing it as a battle each night and feeling like she is not getting quality sleep. Despite these challenges, her pulmonary doctor has reported no symptoms of sleep apnea when she is using the machine.  The patient's husband has  also been trying to lose weight and has been following the patient's nutrition plan. The patient reports that he has been doing well, although she suspects he may be sneaking in some extra food. The patient also mentions that her husband has been showing signs of forgetfulness, which has been a source of stress for her. However, she reports that his condition seems to have improved recently.  Interval History:  Since last office visit she is down 2 lbs. She saw pulmonary medicine for follow up of CPAP therapy for sleep apnea and reports was 201 lbs due to eating off plan for several weeks.  She got back on track over the past week and this resulted in loss of 2 lbs.  Total loss of 20 lbs TBW loss of 9.43% since 03/26/19 Hunger/appetite-fair control Cravings- increased at times, but got back on track with nutrition plan and husband is also trying to follow  Stress- Some improvement in stress level has husband seems to be doing better lately. Sleep- Using CPAP and followed up with Pulmonary medicine, but finds using the CPAP frustrating and "just wants to sleep." Does not feel much improvement in sleep with using CPAP and does not feel sleep is consistently restorative at this time.  Exercise-Still limited following recent pneumonia, but improving over the past month.    Fasting labs obtained today.  She was informed we would discuss her lab results at her next visit unless there is a critical issue that needs  to be addressed sooner. She agreed to keep her next visit at the agreed upon time to discuss these results.    Pharmacotherapy: metformin for hyperglycemia/polyphagia. No GI or other side effects with metformin.   TREATMENT PLAN FOR OBESITY: Obesity: Patient is following a category one nutrition plan 50% of the time and exercising 30-40 minutes, five times per week. Despite some dietary lapses, patient has maintained muscle mass and lost adipose tissue. -Continue current exercise regimen  and aim to adhere to nutrition plan more consistently. Recommended Dietary Goals  Venus is currently in the action stage of change. As such, her goal is to continue weight management plan. She has agreed to the Category 1 Plan.  Behavioral Intervention  We discussed the following Behavioral Modification Strategies today: increasing lean protein intake, decreasing simple carbohydrates , increasing vegetables, increasing lower glycemic fruits, avoiding skipping meals, increasing water intake, emotional eating strategies and understanding the difference between hunger signals and cravings, work on managing stress, creating time for self-care and relaxation measures, continue to practice mindfulness when eating, planning for success, and better snacking choices.  Additional resources provided today: NA  Recommended Physical Activity Goals  Jerae has been advised to work up to 150 minutes of moderate intensity aerobic activity a week and strengthening exercises 2-3 times per week for cardiovascular health, weight loss maintenance and preservation of muscle mass.   She has agreed to Continue current level of physical activity gradually working back to usual exercise routines.    Pharmacotherapy We discussed various medication options to help Amber with her weight loss efforts and we both agreed to continue metformin for polyphagia/hyperglycemia.    Return in about 4 weeks (around 10/06/2022).Marland Kitchen She was informed of the importance of frequent follow up visits to maximize her success with intensive lifestyle modifications for her multiple health conditions.  PHYSICAL EXAM:  Blood pressure 138/71, pulse 81, temperature 98 F (36.7 C), height 5\' 1"  (1.549 m), weight 192 lb (87.1 kg), SpO2 97%. Body mass index is 36.28 kg/m.  General: She is overweight, cooperative, alert, well developed, and in no acute distress. PSYCH: Has normal mood, affect and thought process.   Cardiovascular: HR 80's  regular, BP 138/71. Min peripheral edema.  Lungs: Normal breathing effort, no conversational dyspnea. No SOB Neuro: no focal deficits  DIAGNOSTIC DATA REVIEWED:  BMET    Component Value Date/Time   NA 143 12/29/2021 1047   K 4.4 12/29/2021 1047   CL 103 12/29/2021 1047   CO2 27 12/29/2021 1047   GLUCOSE 82 12/29/2021 1047   GLUCOSE 90 11/08/2016 1035   BUN 15 12/29/2021 1047   CREATININE 0.86 12/29/2021 1047   CREATININE 0.96 (H) 11/08/2016 1035   CALCIUM 10.4 (H) 12/29/2021 1047   GFRNONAA 68 02/26/2020 1045   GFRAA 79 02/26/2020 1045   Lab Results  Component Value Date   HGBA1C 5.4 12/29/2021   HGBA1C 5.6 03/26/2019   Lab Results  Component Value Date   INSULIN 6.2 12/29/2021   INSULIN 8.1 03/26/2019   Lab Results  Component Value Date   TSH 2.470 12/29/2021   CBC    Component Value Date/Time   WBC 5.3 12/29/2021 1047   WBC 6.7 11/08/2016 1035   RBC 4.61 12/29/2021 1047   RBC 4.46 11/08/2016 1035   HGB 13.5 12/29/2021 1047   HCT 41.8 12/29/2021 1047   PLT 221 12/29/2021 1047   MCV 91 12/29/2021 1047   MCH 29.3 12/29/2021 1047   MCH 28.5 11/08/2016 1035  MCHC 32.3 12/29/2021 1047   MCHC 32.6 11/08/2016 1035   RDW 13.5 12/29/2021 1047   Iron Studies    Component Value Date/Time   IRON 64 03/26/2019 1502   TIBC 301 03/26/2019 1502   FERRITIN 260 (H) 03/26/2019 1502   IRONPCTSAT 21 03/26/2019 1502   Lipid Panel     Component Value Date/Time   CHOL 167 12/29/2021 1047   TRIG 55 12/29/2021 1047   HDL 84 12/29/2021 1047   CHOLHDL 2.2 06/22/2021 0956   CHOLHDL 2.6 11/08/2016 1035   VLDL 20 09/15/2015 0914   LDLCALC 72 12/29/2021 1047   LDLCALC 75 11/08/2016 1035   Hepatic Function Panel     Component Value Date/Time   PROT 7.0 12/29/2021 1047   ALBUMIN 4.9 (H) 12/29/2021 1047   AST 24 12/29/2021 1047   ALT 14 12/29/2021 1047   ALKPHOS 95 12/29/2021 1047   BILITOT 0.9 12/29/2021 1047      Component Value Date/Time   TSH 2.470 12/29/2021  1047   Nutritional Lab Results  Component Value Date   VD25OH 27.5 (L) 12/29/2021   VD25OH 31.6 03/26/2019    ASSOCIATED CONDITIONS ADDRESSED TODAY  ASSESSMENT AND PLAN  Problem List Items Addressed This Visit     Essential hypertension - Primary   Hyperlipidemia   Relevant Orders   Lipid Panel With LDL/HDL Ratio   Chronic diastolic (congestive) heart failure (HCC)   Relevant Orders   Brain natriuretic peptide   Vitamin D deficiency   Relevant Medications   Vitamin D, Ergocalciferol, (DRISDOL) 1.25 MG (50000 UNIT) CAPS capsule   Other Relevant Orders   VITAMIN D 25 Hydroxy (Vit-D Deficiency, Fractures)   Hyperglycemia   Relevant Medications   metFORMIN (GLUCOPHAGE) 500 MG tablet   Other Relevant Orders   CMP14+EGFR   Hemoglobin A1c   Insulin, random   Obesity (HCC)- Start BMI 40.06   Relevant Medications   metFORMIN (GLUCOPHAGE) 500 MG tablet   Other Visit Diagnoses     Fatigue, unspecified type       Relevant Orders   Vitamin B12   CBC with Differential/Platelet   TSH      Hypertension Hypertension improved, asymptomatic, reasonably well controlled, no significant medication side effects noted, and needs further observation.  Medication(s): olmesartan 20 mg daily   Diltiazem CD 120 mg daily- Hold if Systolic BP is < 540   BP Readings from Last 3 Encounters:  09/08/22 138/71  09/01/22 (!) 152/84  08/04/22 (!) 147/77   Lab Results  Component Value Date   CREATININE 0.86 12/29/2021   CREATININE 0.82 08/18/2021   CREATININE 0.77 06/22/2021   No results found for: "GFR"  Plan: Continue all antihypertensives at current dosages. Continue to work on nutrition plan to promote weight loss and improve BP control.   Prediabetes with polyphagia:  Last A1c was 5.4- at goal. Insulin 6.2 slightly above goal.   Medication(s): Metformin 500 mg once daily breakfast No GI or other side effects with metformin.  Polyphagia:Yes Lab Results  Component Value Date    HGBA1C 5.4 12/29/2021   HGBA1C 5.6 03/26/2019   Lab Results  Component Value Date   INSULIN 6.2 12/29/2021   INSULIN 8.1 03/26/2019    Plan: Continue and refill Metformin 500 mg once daily breakfast Continue working on nutrition plan to decrease simple carbohydrates, increase lean proteins and exercise to promote weight loss, improve glycemic control and prevent progression to Type 2 diabetes.  Recheck fasting labs today.  Metformin use for  Prediabetes: Patient is on Metformin but does not perceive a significant effect. -Order Hemoglobin A1c and fasting insulin to assess glycemic control. -Discuss possible adjustment of Metformin dose at next visit based on lab results.  Vitamin D Deficiency Vitamin D is not at goal of 50.  Most recent vitamin D level was 27.5. She is on  prescription ergocalciferol 50,000 IU weekly. Lab Results  Component Value Date   VD25OH 27.5 (L) 12/29/2021   VD25OH 31.6 03/26/2019    Plan: Continue and refill  prescription ergocalciferol 50,000 IU weekly Recheck vitamin D level today and adjust supplementation accordingly.  Low vitamin D levels can be associated with adiposity and may result in leptin resistance and weight gain. Also associated with fatigue. Currently on vitamin D supplementation without any adverse effects.  Vitamin D Deficiency: Patient is on Vitamin D supplementation. -Refill Vitamin D prescription. -Order Vitamin D level to monitor status.   Sleep Apnea: Patient is using CPAP machine regularly with no symptoms of sleep apnea when using the machine. However, patient reports difficulty sleeping due to issues with the machine. -Continue using CPAP machine and troubleshoot issues to improve sleep quality.  Chronic Diastolic Heart Failure: No current symptoms or signs of fluid overload. -Order BNP to monitor status.  Fatigue:  Endorses fatigue. Recent pneumonia and decreased activity level may be contributing.  Plan : Check B 12, CBC,  Vitamin D and CMET today.    ATTESTASTION STATEMENTS:  Reviewed by clinician on day of visit: allergies, medications, problem list, medical history, surgical history, family history, social history, and previous encounter notes.   I have personally spent 30 minutes total time today in preparation, patient care, nutritional counseling and documentation for this visit, including the following: review of clinical lab tests; review of medical tests/procedures/services.      Esau Fridman, PA-C

## 2022-09-09 LAB — CBC WITH DIFFERENTIAL/PLATELET
Basos: 1 %
EOS (ABSOLUTE): 0.2 10*3/uL (ref 0.0–0.4)
Eos: 5 %
Hemoglobin: 12.9 g/dL (ref 11.1–15.9)
Immature Grans (Abs): 0 10*3/uL (ref 0.0–0.1)
Immature Granulocytes: 0 %
Lymphs: 34 %
MCHC: 32.4 g/dL (ref 31.5–35.7)
MCV: 88 fL (ref 79–97)
Neutrophils Absolute: 2.8 10*3/uL (ref 1.4–7.0)
Neutrophils: 53 %
Platelets: 213 10*3/uL (ref 150–450)
RDW: 13.7 % (ref 11.7–15.4)
WBC: 5.3 10*3/uL (ref 3.4–10.8)

## 2022-09-09 LAB — CMP14+EGFR
ALT: 13 IU/L (ref 0–32)
BUN/Creatinine Ratio: 23 (ref 12–28)
BUN: 17 mg/dL (ref 8–27)
Calcium: 10.6 mg/dL — ABNORMAL HIGH (ref 8.7–10.3)
Globulin, Total: 2.4 g/dL (ref 1.5–4.5)
Glucose: 80 mg/dL (ref 70–99)
Total Protein: 7 g/dL (ref 6.0–8.5)
eGFR: 84 mL/min/{1.73_m2} (ref >59)

## 2022-09-09 LAB — BRAIN NATRIURETIC PEPTIDE: BNP: 20.7 pg/mL (ref 0.0–100.0)

## 2022-09-09 LAB — LIPID PANEL WITH LDL/HDL RATIO
Cholesterol, Total: 141 mg/dL (ref 100–199)
LDL Chol Calc (NIH): 59 mg/dL (ref 0–99)
LDL/HDL Ratio: 0.9 ratio (ref 0.0–3.2)

## 2022-09-09 LAB — INSULIN, RANDOM: INSULIN: 4.8 u[IU]/mL (ref 2.6–24.9)

## 2022-09-09 LAB — VITAMIN D 25 HYDROXY (VIT D DEFICIENCY, FRACTURES): Vit D, 25-Hydroxy: 39.5 ng/mL (ref 30.0–100.0)

## 2022-09-09 LAB — TSH: TSH: 2.63 u[IU]/mL (ref 0.450–4.500)

## 2022-09-09 LAB — VITAMIN B12

## 2022-09-10 LAB — CBC WITH DIFFERENTIAL/PLATELET
Basophils Absolute: 0.1 10*3/uL (ref 0.0–0.2)
Hematocrit: 39.8 % (ref 34.0–46.6)
Lymphocytes Absolute: 1.8 10*3/uL (ref 0.7–3.1)
MCH: 28.6 pg (ref 26.6–33.0)
Monocytes Absolute: 0.4 10*3/uL (ref 0.1–0.9)
Monocytes: 7 %
RBC: 4.51 x10E6/uL (ref 3.77–5.28)

## 2022-09-10 LAB — LIPID PANEL WITH LDL/HDL RATIO
HDL: 68 mg/dL (ref >39)
Triglycerides: 72 mg/dL (ref 0–149)
VLDL Cholesterol Cal: 14 mg/dL (ref 5–40)

## 2022-09-10 LAB — CMP14+EGFR
AST: 20 IU/L (ref 0–40)
Albumin: 4.6 g/dL (ref 3.8–4.8)
Alkaline Phosphatase: 96 IU/L (ref 44–121)
Bilirubin Total: 0.7 mg/dL (ref 0.0–1.2)
CO2: 27 mmol/L (ref 20–29)
Chloride: 101 mmol/L (ref 96–106)
Creatinine, Ser: 0.73 mg/dL (ref 0.57–1.00)
Potassium: 4.3 mmol/L (ref 3.5–5.2)
Sodium: 143 mmol/L (ref 134–144)

## 2022-09-10 LAB — HEMOGLOBIN A1C
Est. average glucose Bld gHb Est-mCnc: 114 mg/dL
Hgb A1c MFr Bld: 5.6 % (ref 4.8–5.6)

## 2022-09-13 ENCOUNTER — Ambulatory Visit: Payer: Medicare Other | Admitting: Adult Health

## 2022-09-22 ENCOUNTER — Other Ambulatory Visit: Payer: Self-pay | Admitting: Cardiology

## 2022-09-22 DIAGNOSIS — I10 Essential (primary) hypertension: Secondary | ICD-10-CM

## 2022-10-03 ENCOUNTER — Other Ambulatory Visit (INDEPENDENT_AMBULATORY_CARE_PROVIDER_SITE_OTHER): Payer: Self-pay | Admitting: Physician Assistant

## 2022-10-03 DIAGNOSIS — E559 Vitamin D deficiency, unspecified: Secondary | ICD-10-CM

## 2022-10-10 NOTE — Progress Notes (Signed)
.smr  Office: [phone]  /  Fax: [phone]  WEIGHT SUMMARY AND BIOMETRICS  Vitals Temp: 98 F (36.7 C) BP: 139/75 Pulse Rate: 86 SpO2: 97 %   Anthropometric Measurements Height: 5\' 1"  (1.549 m) Weight: 197 lb (89.4 kg) BMI (Calculated): 37.24 Weight at Last Visit: 192lb Weight Lost Since Last Visit: 0lb Weight Gained Since Last Visit: 5lb Starting Weight: 212lb Total Weight Loss (lbs): 15 lb (6.804 kg)   Body Composition  Body Fat %: 50.5 % Fat Mass (lbs): 99.8 lbs Muscle Mass (lbs): 92.8 lbs Visceral Fat Rating : 17   Other Clinical Data Fasting: No Labs: No Today's Visit #: 22 Starting Date: 03/26/19     HPI  Chief Complaint: OBESITY  Margaret Reese is here to discuss her progress with her obesity treatment plan. She is on the the Category 1 Plan and states she is following her eating plan approximately 20 % of the time. She states she is exercising exercise class/home exercises 40-60 minutes 2-4 times per week.   Interval History:  Since last office visit she up 5 lbs.  Struggling to stay on plan lately. Increased stress with husband's illness.  Bio impedence scale reviewed with the patient:  Up 0.4 lbs muscle mass Up 4.8 lbs adipose mass Up 4.8 lbs total body water Hunger/appetite-increased hunger/appetite. Frequent comfort eating Cravings- increased sugar cravings Stress- increased with husbands illness- ? Lewy Body Disease Sleep- Using CPAP, but continuing to struggle with CPAP/equipment issues  Exercise-Walking with a cane and trying to increase as able    Pharmacotherapy: metformin for prediabetes. Feels some weakness/fatigue and feels may be related to metformin and has not found it helpful with cravings or hunger.   TREATMENT PLAN FOR OBESITY:  Recommended Dietary Goals  Margaret Reese is currently in the action stage of change. As such, her goal is to continue weight management plan. She has agreed to the Category 1 Plan.  Behavioral  Intervention  We discussed the following Behavioral Modification Strategies today: increasing lean protein intake, decreasing simple carbohydrates , increasing vegetables, increasing lower glycemic fruits, increasing water intake, emotional eating strategies and understanding the difference between hunger signals and cravings, work on managing stress, creating time for self-care and relaxation measures, continue to practice mindfulness when eating, planning for success, and better snacking choices.  Additional resources provided today: NA  Recommended Physical Activity Goals  Margaret Reese has been advised to work up to 150 minutes of moderate intensity aerobic activity a week and strengthening exercises 2-3 times per week for cardiovascular health, weight loss maintenance and preservation of muscle mass.   She has agreed to Continue current level of physical activity  and Think about ways to increase daily physical activity and overcoming barriers to exercise   Pharmacotherapy We discussed various medication options to help Lashelle with her weight loss efforts and we both agreed to discontinue metformin and continue to work on nutritional and behavioral strategies to promote weight loss.     Return in about 4 weeks (around 11/08/2022).Marland Kitchen She was informed of the importance of frequent follow up visits to maximize her success with intensive lifestyle modifications for her multiple health conditions.  PHYSICAL EXAM:  Blood pressure 139/75, pulse 86, temperature 98 F (36.7 C), height 5\' 1"  (1.549 m), weight 197 lb (89.4 kg), SpO2 97%. Body mass index is 37.22 kg/m.  General: She is overweight, cooperative, alert, well developed, and in no acute distress. PSYCH: Has anxious mood, tearful when discussing husband's illness, affect and thought process.   Cardiovascular:  HR 80's , BP 139/75 Lungs: Normal breathing effort, no conversational dyspnea.  DIAGNOSTIC DATA REVIEWED:  BMET    Component Value  Date/Time   NA 143 09/08/2022 1346   K 4.3 09/08/2022 1346   CL 101 09/08/2022 1346   CO2 27 09/08/2022 1346   GLUCOSE 80 09/08/2022 1346   GLUCOSE 90 11/08/2016 1035   BUN 17 09/08/2022 1346   CREATININE 0.73 09/08/2022 1346   CREATININE 0.96 (H) 11/08/2016 1035   CALCIUM 10.6 (H) 09/08/2022 1346   GFRNONAA 68 02/26/2020 1045   GFRAA 79 02/26/2020 1045   Lab Results  Component Value Date   HGBA1C 5.6 09/08/2022   HGBA1C 5.6 03/26/2019   Lab Results  Component Value Date   INSULIN 4.8 09/08/2022   INSULIN 8.1 03/26/2019   Lab Results  Component Value Date   TSH 2.630 09/08/2022   CBC    Component Value Date/Time   WBC 5.3 09/08/2022 1346   WBC 6.7 11/08/2016 1035   RBC 4.51 09/08/2022 1346   RBC 4.46 11/08/2016 1035   HGB 12.9 09/08/2022 1346   HCT 39.8 09/08/2022 1346   PLT 213 09/08/2022 1346   MCV 88 09/08/2022 1346   MCH 28.6 09/08/2022 1346   MCH 28.5 11/08/2016 1035   MCHC 32.4 09/08/2022 1346   MCHC 32.6 11/08/2016 1035   RDW 13.7 09/08/2022 1346   Iron Studies    Component Value Date/Time   IRON 64 03/26/2019 1502   TIBC 301 03/26/2019 1502   FERRITIN 260 (H) 03/26/2019 1502   IRONPCTSAT 21 03/26/2019 1502   Lipid Panel     Component Value Date/Time   CHOL 141 09/08/2022 1346   TRIG 72 09/08/2022 1346   HDL 68 09/08/2022 1346   CHOLHDL 2.2 06/22/2021 0956   CHOLHDL 2.6 11/08/2016 1035   VLDL 20 09/15/2015 0914   LDLCALC 59 09/08/2022 1346   LDLCALC 75 11/08/2016 1035   Hepatic Function Panel     Component Value Date/Time   PROT 7.0 09/08/2022 1346   ALBUMIN 4.6 09/08/2022 1346   AST 20 09/08/2022 1346   ALT 13 09/08/2022 1346   ALKPHOS 96 09/08/2022 1346   BILITOT 0.7 09/08/2022 1346      Component Value Date/Time   TSH 2.630 09/08/2022 1346   Nutritional Lab Results  Component Value Date   VD25OH 39.5 09/08/2022   VD25OH 27.5 (L) 12/29/2021   VD25OH 31.6 03/26/2019    ASSOCIATED CONDITIONS ADDRESSED TODAY  ASSESSMENT  AND PLAN  Problem List Items Addressed This Visit     Essential hypertension - Primary   Hyperlipidemia   Polyphagia   Vitamin D deficiency   Relevant Medications   Vitamin D, Ergocalciferol, (DRISDOL) 1.25 MG (50000 UNIT) CAPS capsule   Obesity (HCC)- Start BMI 40.06   BMI 37.0-37.9, adult  Hypertension Labs were reviewed today and discussed with the patient.  Hypertension asymptomatic, reasonably well controlled, and no significant medication side effects noted.  Medication(s): olmesartan 20 mg daily   Cardizem CD 120 mg daily  Renal function is normal.   BP Readings from Last 3 Encounters:  10/11/22 139/75  09/08/22 138/71  09/01/22 (!) 152/84   Lab Results  Component Value Date   CREATININE 0.73 09/08/2022   CREATININE 0.86 12/29/2021   CREATININE 0.82 08/18/2021   No results found for: "GFR"  Plan: Continue all antihypertensives at current dosages. Continue to work on nutrition plan to promote weight loss and improve BP control.    Hyperlipidemia Labs were  reviewed today and discussed with the patient.  LDL is at goal. Medication(s): Zocor 20 mg daily . Reports no side effects with Zocor Cardiovascular risk factors: advanced age (older than 39 for men, 1 for women), dyslipidemia, hypertension, obesity (BMI >= 30 kg/m2), and sedentary lifestyle  Lab Results  Component Value Date   CHOL 141 09/08/2022   HDL 68 09/08/2022   LDLCALC 59 09/08/2022   TRIG 72 09/08/2022   CHOLHDL 2.2 06/22/2021   CHOLHDL 2.6 06/01/2020   CHOLHDL 2.9 11/20/2018   Lab Results  Component Value Date   ALT 13 09/08/2022   AST 20 09/08/2022   ALKPHOS 96 09/08/2022   BILITOT 0.7 09/08/2022   The 10-year ASCVD risk score (Arnett DK, et al., 2019) is: 33.5%   Values used to calculate the score:     Age: 77 years     Sex: Female     Is Non-Hispanic African American: No     Diabetic: No     Tobacco smoker: No     Systolic Blood Pressure: 139 mmHg     Is BP treated: Yes     HDL  Cholesterol: 68 mg/dL     Total Cholesterol: 141 mg/dL  Plan: Continue statin. Continue to work on nutrition plan -decreasing simple carbohydrates, increasing lean proteins, decreasing saturated fats and cholesterol , avoiding trans fats and exercise as able to promote weight loss, improve lipids and decrease cardiovascular risks.    Polyphagia Currently this is poorly controlled. Medication(s): Metformin 500 mg once daily breakfast.  Reported side effects:  feels it makes her fatigued  Plan: Discontinue Metformin 500 mg once daily breakfast  Eating disorder/emotional eating Fontella has had issues with stress/emotional eating. Currently this is poorly controlled. Overall mood is stable. Medication(s): None  Plan:  Discussed possibility of working with Dr. Dewaine Conger to help address emotional eating behavior, but the patient does not think she can do telemedicine. She may also benefit from counseling to address some anxiety and increased stress related to her husband's illness,  Plan: Resources for community counseling as well as referral to Dr Dewaine Conger to address emotional eating.    ATTESTASTION STATEMENTS:  Reviewed by clinician on day of visit: allergies, medications, problem list, medical history, surgical history, family history, social history, and previous encounter notes.   I have personally spent 48 minutes total time today in preparation, patient care, nutritional counseling and documentation for this visit, including the following: review of clinical lab tests; review of medical tests/procedures/services.      Tomeika Weinmann, PA-C

## 2022-10-11 ENCOUNTER — Encounter (INDEPENDENT_AMBULATORY_CARE_PROVIDER_SITE_OTHER): Payer: Self-pay | Admitting: Physician Assistant

## 2022-10-11 ENCOUNTER — Ambulatory Visit (INDEPENDENT_AMBULATORY_CARE_PROVIDER_SITE_OTHER): Payer: Medicare Other | Admitting: Physician Assistant

## 2022-10-11 VITALS — BP 139/75 | HR 86 | Temp 98.0°F | Ht 61.0 in | Wt 197.0 lb

## 2022-10-11 DIAGNOSIS — E785 Hyperlipidemia, unspecified: Secondary | ICD-10-CM | POA: Diagnosis not present

## 2022-10-11 DIAGNOSIS — I10 Essential (primary) hypertension: Secondary | ICD-10-CM

## 2022-10-11 DIAGNOSIS — E669 Obesity, unspecified: Secondary | ICD-10-CM

## 2022-10-11 DIAGNOSIS — E559 Vitamin D deficiency, unspecified: Secondary | ICD-10-CM | POA: Diagnosis not present

## 2022-10-11 DIAGNOSIS — R632 Polyphagia: Secondary | ICD-10-CM | POA: Diagnosis not present

## 2022-10-11 DIAGNOSIS — Z6837 Body mass index (BMI) 37.0-37.9, adult: Secondary | ICD-10-CM | POA: Diagnosis not present

## 2022-10-11 DIAGNOSIS — E7800 Pure hypercholesterolemia, unspecified: Secondary | ICD-10-CM

## 2022-10-11 MED ORDER — VITAMIN D (ERGOCALCIFEROL) 1.25 MG (50000 UNIT) PO CAPS
50000.0000 [IU] | ORAL_CAPSULE | ORAL | 0 refills | Status: DC
Start: 2022-10-11 — End: 2022-11-10

## 2022-10-26 DIAGNOSIS — M542 Cervicalgia: Secondary | ICD-10-CM | POA: Diagnosis not present

## 2022-10-26 DIAGNOSIS — M5412 Radiculopathy, cervical region: Secondary | ICD-10-CM | POA: Diagnosis not present

## 2022-11-03 ENCOUNTER — Encounter: Payer: Self-pay | Admitting: Family Medicine

## 2022-11-03 ENCOUNTER — Ambulatory Visit (INDEPENDENT_AMBULATORY_CARE_PROVIDER_SITE_OTHER): Payer: Medicare Other | Admitting: Family Medicine

## 2022-11-03 DIAGNOSIS — Z23 Encounter for immunization: Secondary | ICD-10-CM | POA: Diagnosis not present

## 2022-11-03 DIAGNOSIS — Z7185 Encounter for immunization safety counseling: Secondary | ICD-10-CM

## 2022-11-03 DIAGNOSIS — Z6379 Other stressful life events affecting family and household: Secondary | ICD-10-CM

## 2022-11-03 NOTE — Progress Notes (Signed)
   Subjective:    Patient ID: Margaret Reese, female    DOB: 1945-03-01, 77 y.o.   MRN: [id number]  HPI She is here for evaluation of the slightly elevated calcium.  It went from 10.4 up to 10.6 and she was told to come here to get further evaluation.  She did have a recent vitamin D level which was in the normal range.  She is having no fever, chills, thirst, nausea, weight change, CNS symptoms.  She is interested in getting her immunizations updated.  At the end of the encounter that she then mention the fact that MWM would like her to get involved in counseling since she is not losing weight and is also dealing with the stress of her husband possibly having Lewy body dementia.   Review of Systems     Objective:    Physical Exam Alert and in no distress otherwise not examined       Assessment & Plan:  Serum calcium elevated  Immunization counseling  Stress due to illness of family member  Need for COVID-19 vaccine - Plan: Pfizer Comirnaty Covid -19 Vaccine 28yrs and older  Need for immunization against influenza - Plan: Flu Vaccine Trivalent High Dose (Fluad), CANCELED: Flu vaccine trivalent PF, 6mos and older(Flulaval,Afluria,Fluarix,Fluzone) I discussed the slightly elevated calcium level and the fact that she is having no symptoms of hypercalcemia and therefore he will no need to pursue this any further.  Discussed hyperparathyroidism with her and how that is handled if indeed it gets to that point.  Discussed immunizations and will give her flu and COVID. I then discussed the stress that she is under dealing with her husband and his mental health.  Definitely encouraged her to go ahead and get some counseling to help with that explaining that the fact that stress and eating are interconnected.

## 2022-11-05 ENCOUNTER — Other Ambulatory Visit (INDEPENDENT_AMBULATORY_CARE_PROVIDER_SITE_OTHER): Payer: Self-pay | Admitting: Physician Assistant

## 2022-11-05 DIAGNOSIS — E559 Vitamin D deficiency, unspecified: Secondary | ICD-10-CM

## 2022-11-09 NOTE — Progress Notes (Signed)
.smr  Office: [phone]  /  Fax: [phone]  WEIGHT SUMMARY AND BIOMETRICS  Vitals Temp: 98 F (36.7 C) BP: 126/71 Pulse Rate: 87 SpO2: 97 %  Anthropometric Measurements Height: 5\' 1"  (1.549 m) Weight: 199 lb (90.3 kg) BMI (Calculated): 37.62 Weight at Last Visit: 197 lb Weight Lost Since Last Visit: 0 Weight Gained Since Last Visit: 2 lb Starting Weight: 212 lb Total Weight Loss (lbs): 13 lb (5.897 kg) Peak Weight: 307 lb   Body Composition  Body Fat %: 49.9 % Fat Mass (lbs): 99.4 lbs Muscle Mass (lbs): 94.6 lbs Total Body Water (lbs): 78.8 lbs Visceral Fat Rating : 17  Other Clinical Data Fasting: no Labs: no Today's Visit #: 41 Starting Date: 03/26/19 Other Clinical Data Fasting: no Labs: no Today's Visit #: 41 Starting Date: 03/26/19    HPI  Chief Complaint: OBESITY  Margaret Reese is here to discuss her progress with her obesity treatment plan. She is on the the Category 1 Plan and states she is following her eating plan approximately 30 % of the time. She states she is exercising 0 minutes 0 times per week.  Discussed the use of AI scribe software for clinical note transcription with the patient, who gave verbal consent to proceed.  History of Present Illness  /   Interval History:  Since last office visit she down 2 lbs.   The patient, who is being followed up for obesity treatment, reports experiencing difficulty due to pinched nerves in her neck. The patient believes this was caused by lifting heavy suitcases. The patient describes the pain as severe, to the point where she could not lift her head. The pain also radiates to the fingers and down one leg. The patient sought treatment at a local clinic where she received an anti-inflammatory drug and possibly a steroid shot in the hip, which provided relief after two hours. However, the patient reports that the pain has returned after attending a class.  The patient also mentions having fluid retention and  is on medication for it. The patient has not been taking the medication regularly but is considering taking it due to the feeling of tightness in the legs.  The patient is also under a lot of stress due to her husband's health issues. Married for 58 years now. Her husband has memory problems and has had an MRI scheduled. They are concerned he has Lewy body disease.  The patient is also dealing with the stress of decluttering their home and dealing with heavy books. The patient is considering seeking help from a psychologist to cope with the stress.  Pharmacotherapy: She has stopped metformin as did not feel was helping with excessive hunger and felt she was feeling weak on metformin.   TREATMENT PLAN FOR OBESITY:  Recommended Dietary Goals  Margaret Reese is currently in the action stage of change. As such, her goal is to continue weight management plan. She has agreed to the Category 1 Plan.  Behavioral Intervention  We discussed the following Behavioral Modification Strategies today: increasing lean protein intake, decreasing simple carbohydrates , increasing vegetables, increasing lower glycemic fruits, increasing fiber rich foods, avoiding skipping meals, increasing water intake, emotional eating strategies and understanding the difference between hunger signals and cravings, work on managing stress, creating time for self-care and relaxation measures, continue to practice mindfulness when eating, and planning for success.  Additional resources provided today: NA  Recommended Physical Activity Goals  Margaret Reese has been advised to work up to 150 minutes of moderate  intensity aerobic activity a week and strengthening exercises 2-3 times per week for cardiovascular health, weight loss maintenance and preservation of muscle mass.   She has agreed to Think about ways to increase daily physical activity and overcoming barriers to exercise   Pharmacotherapy We discussed various medication options to help  Margaret Reese with her weight loss efforts and we both agreed to continue to work on nutritional and behavioral strategies to promote weight loss.     Return in about 6 weeks (around 12/22/2022).Marland Kitchen She was informed of the importance of frequent follow up visits to maximize her success with intensive lifestyle modifications for her multiple health conditions.  PHYSICAL EXAM:  Blood pressure 126/71, pulse 87, temperature 98 F (36.7 C), height 5\' 1"  (1.549 m), weight 199 lb (90.3 kg), SpO2 97%. Body mass index is 37.6 kg/m.  General: She is overweight, cooperative, alert, well developed, and in no acute distress. PSYCH: Has normal mood, affect and thought process.  She is tearful when discussing her husband's illness.  Cardiovascular: HR 80's BP 126/71 Lungs: Normal breathing effort, no conversational dyspnea.  DIAGNOSTIC DATA REVIEWED:  BMET    Component Value Date/Time   NA 143 09/08/2022 1346   K 4.3 09/08/2022 1346   CL 101 09/08/2022 1346   CO2 27 09/08/2022 1346   GLUCOSE 80 09/08/2022 1346   GLUCOSE 90 11/08/2016 1035   BUN 17 09/08/2022 1346   CREATININE 0.73 09/08/2022 1346   CREATININE 0.96 (H) 11/08/2016 1035   CALCIUM 10.6 (H) 09/08/2022 1346   GFRNONAA 68 02/26/2020 1045   GFRAA 79 02/26/2020 1045   Lab Results  Component Value Date   HGBA1C 5.6 09/08/2022   HGBA1C 5.6 03/26/2019   Lab Results  Component Value Date   INSULIN 4.8 09/08/2022   INSULIN 8.1 03/26/2019   Lab Results  Component Value Date   TSH 2.630 09/08/2022   CBC    Component Value Date/Time   WBC 5.3 09/08/2022 1346   WBC 6.7 11/08/2016 1035   RBC 4.51 09/08/2022 1346   RBC 4.46 11/08/2016 1035   HGB 12.9 09/08/2022 1346   HCT 39.8 09/08/2022 1346   PLT 213 09/08/2022 1346   MCV 88 09/08/2022 1346   MCH 28.6 09/08/2022 1346   MCH 28.5 11/08/2016 1035   MCHC 32.4 09/08/2022 1346   MCHC 32.6 11/08/2016 1035   RDW 13.7 09/08/2022 1346   Iron Studies    Component Value Date/Time    IRON 64 03/26/2019 1502   TIBC 301 03/26/2019 1502   FERRITIN 260 (H) 03/26/2019 1502   IRONPCTSAT 21 03/26/2019 1502   Lipid Panel     Component Value Date/Time   CHOL 141 09/08/2022 1346   TRIG 72 09/08/2022 1346   HDL 68 09/08/2022 1346   CHOLHDL 2.2 06/22/2021 0956   CHOLHDL 2.6 11/08/2016 1035   VLDL 20 09/15/2015 0914   LDLCALC 59 09/08/2022 1346   LDLCALC 75 11/08/2016 1035   Hepatic Function Panel     Component Value Date/Time   PROT 7.0 09/08/2022 1346   ALBUMIN 4.6 09/08/2022 1346   AST 20 09/08/2022 1346   ALT 13 09/08/2022 1346   ALKPHOS 96 09/08/2022 1346   BILITOT 0.7 09/08/2022 1346      Component Value Date/Time   TSH 2.630 09/08/2022 1346   Nutritional Lab Results  Component Value Date   VD25OH 39.5 09/08/2022   VD25OH 27.5 (L) 12/29/2021   VD25OH 31.6 03/26/2019    ASSOCIATED CONDITIONS ADDRESSED TODAY  ASSESSMENT  AND PLAN  Problem List Items Addressed This Visit     Vitamin D deficiency   Relevant Medications   Vitamin D, Ergocalciferol, (DRISDOL) 1.25 MG (50000 UNIT) CAPS capsule   Obesity (HCC)- Start BMI 40.06   BMI 37.0-37.9, adult   Stress   Cervicalgia - Primary   Fluid retention in legs  Cervical Radiculopathy Recent onset of neck pain with radiation to fingers and leg, likely secondary to heavy lifting. Received anti-inflammatory medication and possible steroid injection with initial improvement, but symptoms have returned. -Advise to avoid heavy lifting and apply ice for acute pain relief. -Consider reevaluation if symptoms persist or worsen.  Stress  Ongoing management with noted stressors potentially impacting weight loss efforts. Discussed referral to psychologist for stress management. -Continue current weight loss efforts. -Check on status of referral to Dr. Dewaine Conger for psychological support.  Fluid Retention Noted increase in fluid weight and leg swelling. Patient has been prescribed diuretic but has not been taking it  regularly. -Advise to take diuretic as needed for fluid retention, especially if legs remain swollen. -Notify Dr. Susann Givens if swelling persists.  Vitamin D Deficiency On Vitamin D supplementation. -Refill Vitamin D prescription for 90-day supply.  Follow-up appointment scheduled for 915 AM on December 21, 2022.  ATTESTASTION STATEMENTS:  Reviewed by clinician on day of visit: allergies, medications, problem list, medical history, surgical history, family history, social history, and previous encounter notes.   I have personally spent 30 minutes total time today in preparation, patient care, nutritional counseling and documentation for this visit, including the following: review of clinical lab tests; review of medical tests/procedures/services.      Bradd Merlos, PA-C

## 2022-11-10 ENCOUNTER — Encounter (INDEPENDENT_AMBULATORY_CARE_PROVIDER_SITE_OTHER): Payer: Self-pay | Admitting: Physician Assistant

## 2022-11-10 ENCOUNTER — Ambulatory Visit (INDEPENDENT_AMBULATORY_CARE_PROVIDER_SITE_OTHER): Payer: Medicare Other | Admitting: Physician Assistant

## 2022-11-10 VITALS — BP 126/71 | HR 87 | Temp 98.0°F | Ht 61.0 in | Wt 199.0 lb

## 2022-11-10 DIAGNOSIS — R609 Edema, unspecified: Secondary | ICD-10-CM | POA: Diagnosis not present

## 2022-11-10 DIAGNOSIS — Z6837 Body mass index (BMI) 37.0-37.9, adult: Secondary | ICD-10-CM | POA: Diagnosis not present

## 2022-11-10 DIAGNOSIS — E669 Obesity, unspecified: Secondary | ICD-10-CM | POA: Diagnosis not present

## 2022-11-10 DIAGNOSIS — R600 Localized edema: Secondary | ICD-10-CM

## 2022-11-10 DIAGNOSIS — E559 Vitamin D deficiency, unspecified: Secondary | ICD-10-CM | POA: Diagnosis not present

## 2022-11-10 DIAGNOSIS — F439 Reaction to severe stress, unspecified: Secondary | ICD-10-CM | POA: Diagnosis not present

## 2022-11-10 DIAGNOSIS — R739 Hyperglycemia, unspecified: Secondary | ICD-10-CM

## 2022-11-10 DIAGNOSIS — M542 Cervicalgia: Secondary | ICD-10-CM | POA: Diagnosis not present

## 2022-11-10 DIAGNOSIS — F3289 Other specified depressive episodes: Secondary | ICD-10-CM

## 2022-11-10 MED ORDER — VITAMIN D (ERGOCALCIFEROL) 1.25 MG (50000 UNIT) PO CAPS
50000.0000 [IU] | ORAL_CAPSULE | ORAL | 0 refills | Status: DC
Start: 2022-11-10 — End: 2023-02-07

## 2022-11-10 MED ORDER — VITAMIN D (ERGOCALCIFEROL) 1.25 MG (50000 UNIT) PO CAPS
50000.0000 [IU] | ORAL_CAPSULE | ORAL | 0 refills | Status: DC
Start: 2022-11-10 — End: 2022-11-10

## 2022-11-13 DIAGNOSIS — M542 Cervicalgia: Secondary | ICD-10-CM | POA: Insufficient documentation

## 2022-11-13 DIAGNOSIS — R600 Localized edema: Secondary | ICD-10-CM | POA: Insufficient documentation

## 2022-11-14 DIAGNOSIS — M542 Cervicalgia: Secondary | ICD-10-CM | POA: Diagnosis not present

## 2022-11-14 DIAGNOSIS — M62838 Other muscle spasm: Secondary | ICD-10-CM | POA: Diagnosis not present

## 2022-12-01 ENCOUNTER — Other Ambulatory Visit: Payer: Self-pay | Admitting: Adult Health

## 2022-12-05 DIAGNOSIS — Z45018 Encounter for adjustment and management of other part of cardiac pacemaker: Secondary | ICD-10-CM | POA: Diagnosis not present

## 2022-12-05 DIAGNOSIS — I441 Atrioventricular block, second degree: Secondary | ICD-10-CM | POA: Diagnosis not present

## 2022-12-06 ENCOUNTER — Other Ambulatory Visit (INDEPENDENT_AMBULATORY_CARE_PROVIDER_SITE_OTHER): Payer: Self-pay | Admitting: Physician Assistant

## 2022-12-06 ENCOUNTER — Telehealth (INDEPENDENT_AMBULATORY_CARE_PROVIDER_SITE_OTHER): Payer: Medicare Other | Admitting: Psychology

## 2022-12-06 DIAGNOSIS — E559 Vitamin D deficiency, unspecified: Secondary | ICD-10-CM

## 2022-12-08 ENCOUNTER — Other Ambulatory Visit: Payer: Self-pay | Admitting: Family Medicine

## 2022-12-08 DIAGNOSIS — E785 Hyperlipidemia, unspecified: Secondary | ICD-10-CM

## 2022-12-08 NOTE — Telephone Encounter (Signed)
Called pt to see if she needs

## 2022-12-17 ENCOUNTER — Other Ambulatory Visit (INDEPENDENT_AMBULATORY_CARE_PROVIDER_SITE_OTHER): Payer: Self-pay | Admitting: Physician Assistant

## 2022-12-17 DIAGNOSIS — E559 Vitamin D deficiency, unspecified: Secondary | ICD-10-CM

## 2022-12-18 ENCOUNTER — Other Ambulatory Visit: Payer: Self-pay | Admitting: Cardiology

## 2022-12-18 DIAGNOSIS — I10 Essential (primary) hypertension: Secondary | ICD-10-CM

## 2022-12-21 ENCOUNTER — Ambulatory Visit (INDEPENDENT_AMBULATORY_CARE_PROVIDER_SITE_OTHER): Payer: Medicare Other | Admitting: Physician Assistant

## 2022-12-26 ENCOUNTER — Encounter (INDEPENDENT_AMBULATORY_CARE_PROVIDER_SITE_OTHER): Payer: Self-pay | Admitting: Physician Assistant

## 2022-12-26 ENCOUNTER — Ambulatory Visit (INDEPENDENT_AMBULATORY_CARE_PROVIDER_SITE_OTHER): Payer: Medicare Other | Admitting: Physician Assistant

## 2022-12-26 VITALS — BP 140/80 | HR 89 | Temp 97.4°F | Ht 61.0 in | Wt 205.0 lb

## 2022-12-26 DIAGNOSIS — Z6838 Body mass index (BMI) 38.0-38.9, adult: Secondary | ICD-10-CM

## 2022-12-26 DIAGNOSIS — F439 Reaction to severe stress, unspecified: Secondary | ICD-10-CM | POA: Diagnosis not present

## 2022-12-26 DIAGNOSIS — M549 Dorsalgia, unspecified: Secondary | ICD-10-CM

## 2022-12-26 DIAGNOSIS — E669 Obesity, unspecified: Secondary | ICD-10-CM

## 2022-12-26 NOTE — Progress Notes (Signed)
.smr  Office: [phone]  /  Fax: [phone]  WEIGHT SUMMARY AND BIOMETRICS  Vitals Temp: (!) 97.4 F (36.3 C) BP: (!) 140/80 Pulse Rate: 89 SpO2: 97 %   Anthropometric Measurements Height: 5\' 1"  (1.549 m) Weight: 205 lb (93 kg) BMI (Calculated): 38.75 Weight at Last Visit: 199 lb Weight Lost Since Last Visit: 0 Weight Gained Since Last Visit: 6 lb Starting Weight: 212 lb Total Weight Loss (lbs): 7 lb (3.175 kg) Peak Weight: 307 lb   Body Composition  Body Fat %: 51.9 % Fat Mass (lbs): 106.8 lbs Muscle Mass (lbs): 93.8 lbs Visceral Fat Rating : 18   Other Clinical Data Fasting: yes Labs: no Today's Visit #: 42 Starting Date: 03/26/19     HPI  Chief Complaint: OBESITY  Margaret Reese is here to discuss her progress with her obesity treatment plan. She is on the the Category 1 Plan and states she is following her eating plan approximately 0 % of the time. She states she is exercising 0 minutes 0 times per week.  Discussed the use of AI scribe software for clinical note transcription with the patient, who gave verbal consent to proceed.  History of Present Illness/     Interval History:  Since last office visit she Up 6 lbs.   The patient, with a history of obesity, neck pain, and sciatica, presents for a follow-up of her obesity treatment plan.  However, her husband's recent fall and suspected Lewy body disease have caused significant stress, disrupting her diet and exercise routine.  The patient reports that she has been eating out frequently, often resorting to fast food due to the lack of time and energy to prepare meals at home.  She also admits to snacking at night, often on whatever food is readily available.  Her physical activity has been limited due to her neck and back pain, which has now radiated down to her leg.  Despite these challenges, the patient expresses a desire to get back on track with her weight management plan.  Provided a Dining out guide  for the patient with calorie goal of 1000-1200 calories and 75+ grams of protein daily.   Pharmacotherapy:  She has stopped metformin as did not feel was helping with excessive hunger and felt she was feeling weak on metformin.   TREATMENT PLAN FOR OBESITY: Obesity Recent weight gain due to increased stress and changes in eating habits related to husband's health issues. Discussed strategies for healthier eating, portion control, and avoiding high-calorie snacks. -Continue to focus on high protein meals and snacks. -Avoid fast food when possible, but if necessary, choose healthier options like grilled nuggets. -Consider non-food related stress reduction activities.  Recommended Dietary Goals  Margaret Reese is currently in the action stage of change. As such, her goal is to continue weight management plan. She has agreed to the Category 1 Plan.  Behavioral Intervention  We discussed the following Behavioral Modification Strategies today: continue to work on maintaining a reduced calorie state, getting the recommended amount of protein, incorporating whole foods, making healthy choices, staying well hydrated and practicing mindfulness when eating..  Additional resources provided today: Dining out guide  Recommended Physical Activity Goals  Margaret Reese has been advised to work up to 150 minutes of moderate intensity aerobic activity a week and strengthening exercises 2-3 times per week for cardiovascular health, weight loss maintenance and preservation of muscle mass.   She has agreed to Think about enjoyable ways to increase daily physical activity and overcoming barriers to  exercise and Increase physical activity in their day and reduce sedentary time (increase NEAT).   Pharmacotherapy We discussed various medication options to help Margaret Reese with her weight loss efforts and we both agreed to continue to work on nutritional and behavioral strategies to promote weight loss.  .    Return in about 6  weeks (around 02/06/2023).Marland Kitchen She was informed of the importance of frequent follow up visits to maximize her success with intensive lifestyle modifications for her multiple health conditions.  PHYSICAL EXAM:  Blood pressure (!) 140/80, pulse 89, temperature (!) 97.4 F (36.3 C), height 5\' 1"  (1.549 m), weight 205 lb (93 kg), SpO2 97%. Body mass index is 38.73 kg/m.  General: She is overweight, cooperative, alert, well developed, and in no acute distress. PSYCH: Has normal mood, affect and thought process.   Cardiovascular: HR 80's BP 140/80 Lungs: Normal breathing effort, no conversational dyspnea. Neuro: no focal deficits  DIAGNOSTIC DATA REVIEWED:  BMET    Component Value Date/Time   NA 143 09/08/2022 1346   K 4.3 09/08/2022 1346   CL 101 09/08/2022 1346   CO2 27 09/08/2022 1346   GLUCOSE 80 09/08/2022 1346   GLUCOSE 90 11/08/2016 1035   BUN 17 09/08/2022 1346   CREATININE 0.73 09/08/2022 1346   CREATININE 0.96 (H) 11/08/2016 1035   CALCIUM 10.6 (H) 09/08/2022 1346   GFRNONAA 68 02/26/2020 1045   GFRAA 79 02/26/2020 1045   Lab Results  Component Value Date   HGBA1C 5.6 09/08/2022   HGBA1C 5.6 03/26/2019   Lab Results  Component Value Date   INSULIN 4.8 09/08/2022   INSULIN 8.1 03/26/2019   Lab Results  Component Value Date   TSH 2.630 09/08/2022   CBC    Component Value Date/Time   WBC 5.3 09/08/2022 1346   WBC 6.7 11/08/2016 1035   RBC 4.51 09/08/2022 1346   RBC 4.46 11/08/2016 1035   HGB 12.9 09/08/2022 1346   HCT 39.8 09/08/2022 1346   PLT 213 09/08/2022 1346   MCV 88 09/08/2022 1346   MCH 28.6 09/08/2022 1346   MCH 28.5 11/08/2016 1035   MCHC 32.4 09/08/2022 1346   MCHC 32.6 11/08/2016 1035   RDW 13.7 09/08/2022 1346   Iron Studies    Component Value Date/Time   IRON 64 03/26/2019 1502   TIBC 301 03/26/2019 1502   FERRITIN 260 (H) 03/26/2019 1502   IRONPCTSAT 21 03/26/2019 1502   Lipid Panel     Component Value Date/Time   CHOL 141  09/08/2022 1346   TRIG 72 09/08/2022 1346   HDL 68 09/08/2022 1346   CHOLHDL 2.2 06/22/2021 0956   CHOLHDL 2.6 11/08/2016 1035   VLDL 20 09/15/2015 0914   LDLCALC 59 09/08/2022 1346   LDLCALC 75 11/08/2016 1035   Hepatic Function Panel     Component Value Date/Time   PROT 7.0 09/08/2022 1346   ALBUMIN 4.6 09/08/2022 1346   AST 20 09/08/2022 1346   ALT 13 09/08/2022 1346   ALKPHOS 96 09/08/2022 1346   BILITOT 0.7 09/08/2022 1346      Component Value Date/Time   TSH 2.630 09/08/2022 1346   Nutritional Lab Results  Component Value Date   VD25OH 39.5 09/08/2022   VD25OH 27.5 (L) 12/29/2021   VD25OH 31.6 03/26/2019    ASSOCIATED CONDITIONS ADDRESSED TODAY  ASSESSMENT AND PLAN  Problem List Items Addressed This Visit     Obesity (HCC)- Start BMI 40.06   BMI 38.0-38.9,adult   Stress - Primary  Back pain   Stress with Eating disorder/emotional eating:  [place] has had issues with stress/emotional eating. Currently this is poorly controlled. Overall mood is stable. Medication(s): None  Plan:  Discussed strategies for healthier eating, portion control, and avoiding high-calorie snacks. -Continue to focus on high protein meals and snacks. -Avoid fast food when possible, but if necessary, choose healthier options like grilled nuggets. -Consider non-food related stress reduction activities.   Sciatica Reports of pain radiating down the leg. Currently not under any treatment due to other health priorities. -Consider seeking treatment when feasible.  Follow-up appointment scheduled for December 17th at 9 AM.  ATTESTASTION STATEMENTS:  Reviewed by clinician on day of visit: allergies, medications, problem list, medical history, surgical history, family history, social history, and previous encounter notes.   I have personally spent 42 minutes total time today in preparation, patient care, nutritional counseling and documentation for this visit, including the following:  review of clinical lab tests; review of medical tests/procedures/services.      Dafina Suk, PA-C

## 2023-01-02 ENCOUNTER — Telehealth (INDEPENDENT_AMBULATORY_CARE_PROVIDER_SITE_OTHER): Payer: Medicare Other | Admitting: Psychology

## 2023-01-02 DIAGNOSIS — F32A Depression, unspecified: Secondary | ICD-10-CM

## 2023-01-02 DIAGNOSIS — F419 Anxiety disorder, unspecified: Secondary | ICD-10-CM

## 2023-01-02 DIAGNOSIS — F5089 Other specified eating disorder: Secondary | ICD-10-CM

## 2023-01-02 NOTE — Progress Notes (Signed)
Office: [phone]  /  Fax: [phone]    Date: January 02, 2023    Appointment Start Time: 11:57am Duration: 55 minutes Provider: Lawerance Cruel, Psy.D. Type of Session: Intake for Individual Therapy  Location of Patient: Home (private location) Location of Provider: Provider's home (private office) Type of Contact: Telepsychological Visit via MyChart Video Visit  Informed Consent: Prior to proceeding with today's appointment, two pieces of identifying information were obtained. In addition, Margaret Reese's physical location at the time of this appointment was obtained as well a phone number she could be reached at in the event of technical difficulties. Margaret Reese and this provider participated in today's telepsychological service.   The provider's role was explained to [employer]. The provider reviewed and discussed issues of confidentiality, privacy, and limits therein (e.g., reporting obligations). In addition to verbal informed consent, written informed consent for psychological services was obtained prior to the initial appointment. Since the clinic is not a 24/7 crisis center, mental health emergency resources were shared and this  provider explained MyChart, e-mail, voicemail, and/or other messaging systems should be utilized only for non-emergency reasons. This provider also explained that information obtained during appointments Margaret be placed in Margaret Reese's medical record and relevant information Margaret be shared with other providers at [facility] at any locations for coordination of care. Margaret Reese agreed information may be shared with other [facility] providers as needed for coordination of care and by signing the service agreement document, she provided written consent for coordination of care. Prior to initiating telepsychological services, Margaret Reese completed an informed consent document, which included the development of a safety plan (i.e., an emergency contact and  emergency resources) in the event of an emergency/crisis. Margaret Reese verbally acknowledged understanding she is ultimately responsible for understanding her insurance benefits for telepsychological and in-person services. This provider also reviewed confidentiality, as it relates to telepsychological services. Margaret Reese  acknowledged understanding that appointments cannot be recorded without both party consent and she is aware she is responsible for securing confidentiality on her end of the session. Margaret Reese verbally consented to proceed.  Chief Complaint/HPI: Margaret Reese was referred by Lazaro Arms, PA-C due to stress. Per the note for the visit with Margaret Rayburn, PA-C on 11/10/2022, "Ongoing management with noted stressors potentially impacting weight loss efforts. Discussed referral to psychologist for stress management."   During today's appointment, Margaret Reese was verbally administered a questionnaire assessing various behaviors related to emotional eating behaviors. Margaret Reese endorsed the following: overeat when you are celebrating, experience food cravings on a regular basis, eat certain foods when you are anxious, stressed, depressed, or your feelings are hurt, use food to help you cope with emotional situations, find food is comforting to you, overeat when you are angry or upset, overeat when you are worried about something, and overeat when you are alone, but eat much less when you are with other people. She shared she craves sugar. Hope believes the onset of emotional eating behaviors was childhood, and described the current frequency of emotional eating behaviors as "daily." In addition, Margaret Reese denied a history of binge eating behaviors. Margaret Reese denied a history of significantly restricting food intake, purging and engagement in other compensatory strategies for weight loss, and has never been diagnosed with an eating disorder. She also denied a history of treatment for emotional eating behaviors. Currently, Margaret Reese indicated  she does not like many food options, adding she also does not like to cook. Furthermore, Margaret Reese reported ongoing stress due to her husband's health.  Mental Status Examination:  Appearance: neat Behavior: appropriate to circumstances Mood: sad Affect: mood congruent Speech: WNL Eye Contact: appropriate Psychomotor Activity: WNL Gait: unable to assess  Thought Process: linear, logical, and goal directed and denies suicidal, homicidal, and self-harm ideation, plan and intent  Thought Content/Perception: no hallucinations, delusions, bizarre thinking or behavior endorsed or observed Orientation: AAOx4 Memory/Concentration: intact Insight/Judgment: fair  Family & Psychosocial History: Margaret Reese reported she is married and she has two adult sons. 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 Additionally, Margaret Reese shared her highest level of education obtained is "couple of years of college." Currently, Margaret Reese's social support system consists of her brother, sons and couple of girlfriends. Moreover, Margaret Reese stated she resides with her husband.   Medical History:  Past Medical History:  Diagnosis Date   Allergy    Anxiety    Arthritis    Asthma    Complication of anesthesia    pt states has difficulty awakening; also has increased sinus drainage   Constipation    Edema, lower extremity    Encounter for interrogation of cardiac pacemaker 09/05/2018   Falls    GERD (gastroesophageal reflux disease)    H/O back injury    History of bronchitis    History of colon polyps    Hypertension    Imbalance    Kidney cysts    pt states not sure which kidney does see kidney specialist yearly pt states every thing okay currently    Legally blind in right eye, as defined in [place]    Mobitz type II atrioventricular block 11/08/2016   Multiple gastric ulcers    Obesity    OSA (obstructive sleep apnea) 05/03/2022   Pacemaker S/P [employer] Jude Medical Assurity MRI model  [id number] 05/20/2016   Renal stone    Retinal vein occlusion    Shortness of breath    Sinus node dysfunction (HCC) 12/16/2018   Tingling    left arm    Trace cataracts    Urinary frequency    Vitamin D deficiency    Past Surgical History:  Procedure Laterality Date   CARDIOVASCULAR STRESS TEST  dec 2016   CHOLECYSTECTOMY     COLONOSCOPY  2011   EYE SURGERY Bilateral    Cataract surgery.    LEFT HEART CATH AND CORONARY ANGIOGRAPHY N/A 05/19/2016   Procedure: Left Heart Cath and Coronary Angiography;  Surgeon: Margaret Cobb, MD;  Location: [facility];  Service: Cardiovascular;  Laterality: N/A;   PACEMAKER IMPLANT N/A 05/20/2016   Procedure: Pacemaker Implant;  Surgeon: Margaret Jorja Loa, MD;  Location: [facility];  Service: Cardiovascular;  Laterality: N/A;   TONSILLECTOMY     TOTAL HIP ARTHROPLASTY Right 01/30/2015   Procedure: RIGHT TOTAL HIP ARTHROPLASTY ANTERIOR APPROACH AND REMOVAL LIPOMA RIGHT HIP;  Surgeon: Margaret Hitch, MD;  Location: [facility];  Service: Orthopedics;  Laterality: Right;   UPPER GI ENDOSCOPY     Current Outpatient Medications on File Prior to Visit  Medication Sig Dispense Refill   albuterol (VENTOLIN HFA) 108 (90 Base) MCG/ACT inhaler Inhale 1-2 puffs into the lungs every 6 (six) hours as needed for wheezing or shortness of breath.     aspirin 81 MG tablet Take 81 mg by mouth daily.     diltiazem (CARDIZEM CD) 120 MG 24 hr capsule TAKE 1 CAPSULE BY MOUTH DAILY. HOLD IF BLOOD PRESSURE NUMBER IS LESS THAN 100 MMHG 90 capsule 1   fluticasone furoate-vilanterol (  BREO ELLIPTA) 100-25 MCG/ACT AEPB INHALE 1 PUFF INTO THE LUNGS DAILY 60 each 11   loratadine (CLARITIN) 10 MG tablet Take 10 mg by mouth daily as needed for allergies.     Multiple Vitamins-Minerals (PRESERVISION AREDS 2) CAPS Take 1 capsule by mouth 2 (two) times daily.      olmesartan (BENICAR) 20 MG tablet TAKE 1 TABLET BY MOUTH EVERY DAY 90 tablet 1   potassium citrate (UROCIT-K)  10 MEQ (1080 MG) SR tablet Take 10 mEq by mouth 2 (two) times daily.     simvastatin (ZOCOR) 20 MG tablet TAKE 1 TABLET(20 MG) BY MOUTH EVERY EVENING 90 tablet 1   Vitamin D, Ergocalciferol, (DRISDOL) 1.25 MG (50000 UNIT) CAPS capsule Take 1 capsule (50,000 Units total) by mouth every 7 (seven) days. 12 capsule 0   No current facility-administered medications on file prior to visit.  Margaret Reese stated she is medication compliant.   Mental Health History: Margaret Reese reported she has never attended therapeutic services. She denied a history of psychotropic medications. Margaret Reese reported there is no history of hospitalizations for psychiatric concerns. Margaret Reese endorsed a family history of bipolar disorder (mother). Furthermore, Masiyah reported there is no history of trauma including psychological, physical , and sexual abuse, as well as neglect.   Lanya described her typical mood lately as "tense." Lakena denied current alcohol use. She denied tobacco use. She denied illicit/recreational substance use. Furthermore, Icey indicated she is not experiencing the following: hallucinations and delusions, paranoia, symptoms of mania , social withdrawal, crying spells, panic attacks, memory concerns, attention and concentration issues, and obsessions and compulsions. She also denied history of and current suicidal ideation, plan, and intent; history of and current homicidal ideation, plan, and intent; and history of and current engagement in self-harm.  Legal History: Aynslee reported there is no history of legal involvement.   Structured Assessments Results: The Patient Health Questionnaire-9 (PHQ-9) is a self-report measure that assesses symptoms and severity of depression over the course of the last two weeks. Vaudie obtained a score of 13 suggesting moderate depression. Chrisa finds the endorsed symptoms to be somewhat difficult. [0= Not at all; 1= Several days; 2= More than half the days; 3= Nearly every day] Little interest or  pleasure in doing things 0  Feeling down, depressed, or hopeless 3  Trouble falling or staying asleep, or sleeping too much 1  Feeling tired or having little energy 3  Poor appetite or overeating 3  Feeling bad about yourself --- or that you are a failure or have let yourself or your family down 3  Trouble concentrating on things, such as reading the newspaper or watching television 0  Moving or speaking so slowly that other people could have noticed? Or the opposite --- being so fidgety or restless that you have been moving around a lot more than usual 0  Thoughts that you would be better off dead or hurting yourself in some way 0  PHQ-9 Score 13    The Generalized Anxiety Disorder-7 (GAD-7) is a brief self-report measure that assesses symptoms of anxiety over the course of the last two weeks. Monai obtained a score of 5 suggesting mild anxiety. Tanith finds the endorsed symptoms to be somewhat difficult. [0= Not at all; 1= Several days; 2= Over half the days; 3= Nearly every day] Feeling nervous, anxious, on edge 1  Not being able to stop or control worrying 1  Worrying too much about different things 3  Trouble relaxing 0  Being so restless  that it's hard to sit still 0  Becoming easily annoyed or irritable 0  Feeling afraid as if something awful might happen 0  GAD-7 Score 5   Interventions:  Conducted a chart review Focused on rapport building Verbally administered PHQ-9 and GAD-7 for symptom monitoring Verbally administered Food & Mood questionnaire to assess various behaviors related to emotional eating Provided emphatic reflections and validation Psychoeducation provided regarding physical versus emotional hunger  Diagnostic Impressions & Provisional DSM-5 Diagnosis(es): Deyona reported a history of engagement in emotional eating behaviors starting in childhood, and described the current frequency of emotional eating behaviors as "daily." She denied engagement in any other  disordered eating behaviors. She also discussed ongoing stress secondary to her husband's health as well as endorsed symptoms of anxiety and depression. Based on the aforementioned, the following diagnoses were assigned: F50.89 Other Specified Feeding or Eating Disorder, Emotional Eating Behaviors, F41.9 Unspecified Anxiety Disorder, and  F32.A Unspecified Depressive Disorder.  Plan: Sanda appears able and willing to participate as evidenced by engagement in reciprocal conversation and asking questions as needed for clarification. The next appointment is scheduled for 01/31/2023 at 8:30am, which Margaret be via MyChart Video Visit. The following treatment goal was established: increase coping skills. This provider Margaret regularly review the treatment plan and medical chart to keep informed of status changes. Chance expressed understanding and agreement with the initial treatment plan of care. Rania Margaret be sent a handout via e-mail to utilize between now and the next appointment to increase awareness of hunger patterns and subsequent eating. Lashonta provided verbal consent during today's appointment for this provider to send the handout via e-mail. Of note, this provider recommended traditional therapeutic services; however, Lyann noted a plan to continue with this provider at this time and consider initiating traditional therapeutic services in the future.

## 2023-01-03 ENCOUNTER — Ambulatory Visit (INDEPENDENT_AMBULATORY_CARE_PROVIDER_SITE_OTHER): Payer: Medicare Other | Admitting: Adult Health

## 2023-01-03 ENCOUNTER — Encounter: Payer: Self-pay | Admitting: Adult Health

## 2023-01-03 VITALS — BP 126/68 | HR 76 | Temp 97.4°F | Ht 61.0 in | Wt 210.6 lb

## 2023-01-03 DIAGNOSIS — J4530 Mild persistent asthma, uncomplicated: Secondary | ICD-10-CM | POA: Diagnosis not present

## 2023-01-03 DIAGNOSIS — J301 Allergic rhinitis due to pollen: Secondary | ICD-10-CM

## 2023-01-03 DIAGNOSIS — E669 Obesity, unspecified: Secondary | ICD-10-CM | POA: Diagnosis not present

## 2023-01-03 DIAGNOSIS — G4733 Obstructive sleep apnea (adult) (pediatric): Secondary | ICD-10-CM

## 2023-01-03 NOTE — Progress Notes (Signed)
@Patient  ID: Margaret Reese, female    DOB: [birth year], 77 y.o.   MRN: [id number]  Chief Complaint  Patient presents with   Follow-up    Referring provider: Ronnald Nian, MD Discussed the use of AI scribe software for clinical note transcription with the patient, who gave verbal consent to proceed.   HPI: 77 year old female never smoker followed for mild persistent asthma, allergic rhinitis and mild obstructive sleep apnea Medical history significant for hypertension, AV block status post pacemaker  TEST/EVENTS :  08/22/2019: LVEF 55 to 60%, moderate LVH, grade 1 diastolic impairment, elevated LAP, pacemaker/ICD lead noted in the RV, mild MR, moderate TR, mild pulmonary hypertension with RVSP 39 mmHg.   09/01/2021: Left ventricle cavity is normal in size. Moderate concentric hypertrophy of the left ventricle. Abnormal septal wall motion due to left bundle branch block. Normal LV systolic function with EF 57%. Indeterminate diastolic filling pattern.  Mild to moderate tricuspid regurgitation. RVSP measures 41 mmHg. No significant change compared to previous study on 08/22/2019.   Stress Testing:  January 23, 2015:Myocardial perfusion imaging is normal. Overall left ventricular systolic function was normal without regional wall motion abnormalities. The left ventricular ejection fraction was 78%.  This is a normal/low risk study.   Heart Catheterization: 04/2016 Dr. Orpah Cobb: Normal coronaries   Jun 22, 2021 absolute eosinophil count 200   10/2021 PFT moderate airflow obstruction with significant reversibility FEV1 at 73%, ratio 69, FVC 78%, positive bronchodilator change (21% change) and DLCO at 131%.   Home sleep study December 24, 2021 shows mild sleep apnea with a AHI at 10.5/hour and SpO2 low at 82%  01/03/2023 Follow up:  Patient returns for a 94-month follow-up.  She is followed for mild persistent asthma, allergic rhinitis and obstructive sleep apnea  She reported good  compliance with her CPAP machine, noting that she falls asleep quickly once it is turned on. However, she experienced issues with the nasal mask slipping out during sleep, causing noise and disturbing her spouse. The patient expressed interest in exploring different mask options to improve comfort and fit. CPAP download shows excellent compliance with 100% usage.  Daily average usage at 7 hours.  Patient is on auto CPAP 5 to 15 cm H2O.  AHI 0.8, daily average pressure 11.8 cm H2O. Using nasal pillows. Headgear slips at times.  Feels that she benefits from CPAP with decreased daytime sleepiness.  The patient also reported a significant weight gain of 20-30 pounds, which she attributed to increased stress and reduced physical activity due to recent personal circumstances. Her spouse began experiencing hallucinations and is undergoing testing for potential dementia. This situation has significantly increased the patient's stress levels,  The patient has recently resumed exercising and started seeing a psychologist to manage her stress.  Regarding her asthma, the patient reported no current problems and is continuing her treatment with BreoThe patient has received all recommended vaccines except for RSV, which she plans to receive. She is up-to-date with her COVID-19 and influenza vaccinations.  She denies any flare of cough or wheezing.  No increased albuterol use.  The patient expressed concerns about her recent weight gain and its potential impact on her sleep apnea. She has a history of significant weight loss through exercise and is keen to resume her active lifestyle to manage her weight. Despite the challenges, the patient remains committed to improving her health and managing her conditions effectively.       Asthma Control Test ACT Total Score  01/03/2023  9:11 AM 24  09/01/2022 10:15 AM 21  12/31/2021  8:28 AM 21      Allergies  Allergen Reactions   Toprol Xl [Metoprolol Tartrate]      Depressive mood.     Immunization History  Administered Date(s) Administered   Fluad Trivalent(High Dose 65+) 11/03/2022   Influenza Split 11/07/2011, 12/01/2012   Influenza Whole 01/11/2006, 11/28/2008, 11/30/2010   Influenza-Unspecified 12/08/2014, 12/07/2015, 11/21/2016, 11/21/2017, 11/16/2018, 11/03/2020, 01/15/2022   PFIZER(Purple Top)SARS-COV-2 Vaccination 04/07/2019, 04/30/2019, 10/24/2019   Pfizer Covid-19 Vaccine Bivalent Booster 67yrs & up 12/25/2020   Pfizer(Comirnaty)Fall Seasonal Vaccine 12 years and older 11/03/2022   Pneumococcal Conjugate-13 09/15/2015   Pneumococcal Polysaccharide-23 09/07/2004, 09/11/2012   Td 09/17/2004   Tdap 06/01/2015   Zoster, Live 12/16/2009    Past Medical History:  Diagnosis Date   Allergy    Anxiety    Arthritis    Asthma    Complication of anesthesia    pt states has difficulty awakening; also has increased sinus drainage   Constipation    Edema, lower extremity    Encounter for interrogation of cardiac pacemaker 09/05/2018   Falls    GERD (gastroesophageal reflux disease)    H/O back injury    History of bronchitis    History of colon polyps    Hypertension    Imbalance    Kidney cysts    pt states not sure which kidney does see kidney specialist yearly pt states every thing okay currently    Legally blind in right eye, as defined in [place]    Mobitz type II atrioventricular block 11/08/2016   Multiple gastric ulcers    Obesity    OSA (obstructive sleep apnea) 05/03/2022   Pacemaker S/P St Jude Medical Assurity MRI model [id number] 05/20/2016   Renal stone    Retinal vein occlusion    Shortness of breath    Sinus node dysfunction (HCC) 12/16/2018   Tingling    left arm    Trace cataracts    Urinary frequency    Vitamin D deficiency     Tobacco History: Social History   Tobacco Use  Smoking Status Never  Smokeless Tobacco Never   Counseling given: Not Answered   Outpatient Medications Prior to Visit  Medication Sig  Dispense Refill   albuterol (VENTOLIN HFA) 108 (90 Base) MCG/ACT inhaler Inhale 1-2 puffs into the lungs every 6 (six) hours as needed for wheezing or shortness of breath.     aspirin 81 MG tablet Take 81 mg by mouth daily.     diltiazem (CARDIZEM CD) 120 MG 24 hr capsule TAKE 1 CAPSULE BY MOUTH DAILY. HOLD IF BLOOD PRESSURE NUMBER IS LESS THAN 100 MMHG 90 capsule 1   fluticasone furoate-vilanterol (BREO ELLIPTA) 100-25 MCG/ACT AEPB INHALE 1 PUFF INTO THE LUNGS DAILY 60 each 11   loratadine (CLARITIN) 10 MG tablet Take 10 mg by mouth daily as needed for allergies.     Multiple Vitamins-Minerals (PRESERVISION AREDS 2) CAPS Take 1 capsule by mouth 2 (two) times daily.      olmesartan (BENICAR) 20 MG tablet TAKE 1 TABLET BY MOUTH EVERY DAY 90 tablet 1   potassium citrate (UROCIT-K) 10 MEQ (1080 MG) SR tablet Take 10 mEq by mouth 2 (two) times daily.     simvastatin (ZOCOR) 20 MG tablet TAKE 1 TABLET(20 MG) BY MOUTH EVERY EVENING 90 tablet 1   Vitamin D, Ergocalciferol, (DRISDOL) 1.25 MG (50000 UNIT) CAPS capsule Take 1 capsule (50,000 Units total) by mouth  every 7 (seven) days. 12 capsule 0   No facility-administered medications prior to visit.     Review of Systems:   Constitutional:   No  weight loss, night sweats,  Fevers, chills, +fatigue, or  lassitude.  HEENT:   No headaches,  Difficulty swallowing,  Tooth/dental problems, or  Sore throat,                No sneezing, itching, ear ache, nasal congestion, post nasal drip,   CV:  No chest pain,  Orthopnea, PND, swelling in lower extremities, anasarca, dizziness, palpitations, syncope.   GI  No heartburn, indigestion, abdominal pain, nausea, vomiting, diarrhea, change in bowel habits, loss of appetite, bloody stools.   Resp: No shortness of breath with exertion or at rest.  No excess mucus, no productive cough,  No non-productive cough,  No coughing up of blood.  No change in color of mucus.  No wheezing.  No chest wall deformity  Skin:  no rash or lesions.  GU: no dysuria, change in color of urine, no urgency or frequency.  No flank pain, no hematuria   MS:  No joint pain or swelling.  No decreased range of motion.  + back pain.    Physical Exam  BP 126/68 (BP Location: Left Arm, Patient Position: Sitting, Cuff Size: Normal)   Pulse 76   Temp (!) 97.4 F (36.3 C) (Oral)   Ht 5\' 1"  (1.549 m)   Wt 210 lb 9.6 oz (95.5 kg)   SpO2 98%   BMI 39.79 kg/m   GEN: A/Ox3; pleasant , NAD, well nourished , cane    HEENT:  [place]/AT,   NOSE-clear, THROAT-clear, no lesions, no postnasal drip or exudate noted.  Class 3 MP airway   NECK:  Supple w/ fair ROM; no JVD; normal carotid impulses w/o bruits; no thyromegaly or nodules palpated; no lymphadenopathy.    RESP  Clear  P & A; w/o, wheezes/ rales/ or rhonchi. no accessory muscle use, no dullness to percussion  CARD:  RRR, no m/r/g, no peripheral edema, pulses intact, no cyanosis or clubbing.  GI:   Soft & nt; nml bowel sounds; no organomegaly or masses detected.   Musco: Warm bil, no deformities or joint swelling noted.   Neuro: alert, no focal deficits noted.    Skin: Warm, no lesions or rashes    Lab Results:  CBC    BNP    Imaging: No results found.  Administration History     None          Latest Ref Rng & Units 11/19/2021    8:27 AM  PFT Results  FVC-Pre L 1.52   FVC-Predicted Pre % 67   FVC-Post L 1.77   FVC-Predicted Post % 78   Pre FEV1/FVC % % 67   Post FEV1/FCV % % 69   FEV1-Pre L 1.01   FEV1-Predicted Pre % 60   FEV1-Post L 1.23   DLCO uncorrected ml/min/mmHg 21.63   DLCO UNC% % 131   DLCO corrected ml/min/mmHg 21.63   DLCO COR %Predicted % 131   DLVA Predicted % 133   TLC L 5.11   TLC % Predicted % 114   RV % Predicted % 151     No results found for: "NITRICOXIDE"      Assessment & Plan:  Assessment and Plan    Obstructive Sleep Apnea   She has mild obstructive sleep apnea, which is well-controlled with CPAP  therapy, reporting good compliance and averaging seven  hours per night without significant apneic episodes. She experiences issues with her nasal mask fit, causing noise and discomfort. We discussed alternative masks, including Dreamwear nasal mask and nasal pillows, and emphasized the importance of CPAP in preventing memory impairment and protecting heart health, We noted that the Inspire device and dental appliances are not suitable options due to her mild apnea severity, dental work, and TMJ. We recommend trying a Dreamwear nasal mask or nasal pillows, adjusting the head strap for a better fit, discussing mask options with the home care company, using a silk pillow or hair covering to prevent hair damage, and using saline nasal spray and gel to alleviate dryness.  Asthma   Her asthma is mild persistent - well-controlled with Breo, without symptoms of cough, wheezing, or increased albuterol use. She is compliant with Breo therapy,  We will continue Breo as prescribed, use albuterol as needed, and monitor for symptoms like cough, wheezing, or decreased activity tolerance, reporting any occurrences.  Allergic Rhinitis   She has allergic rhinitis but is currently asymptomatic, with Claritin available for use if needed. We advise using Claritin as needed for allergy symptoms.  General Health Maintenance   She is up to date on most vaccinations but needs the RSV vaccine. We discussed the importance of physical activity for mental and physical health, encouraging low-impact exercises such as pool walking or water aerobics. Counseling for mental health support is ongoing. We recommend getting the RSV vaccine at the pharmacy, encouraging a return to physical activities, and continuing counseling for mental health support.  Follow-up   Schedule a follow-up visit in six months and contact the clinic if any issues arise or if a virtual visit is needed.           Rubye Oaks, NP 01/03/2023

## 2023-01-03 NOTE — Patient Instructions (Addendum)
Continue BREO 1 puff daily , rinse after use.  Albuterol inhaler As needed   Asthma action plan as discussed.  Saline nasal spray rinse Twice daily   Saline nasal gel At bedtime  (AYR gel)  Claritin 5mg  At bedtime As needed   Activity as tolerated   Continue on CPAP At bedtime  , goal is to wear all night long for at least 6hr or more each night .  Might like the dream wear nasal or nasal pillows.  Work on healthy weight loss  Healthy sleep regimen  Do not drive if sleepy  Follow up in 6  months with Dr. Wynona Neat or Jaanai Salemi NP and As needed  (30 min )  Please contact office for sooner follow up if symptoms do not improve or worsen or seek emergency care

## 2023-01-04 ENCOUNTER — Encounter: Payer: Medicare Other | Admitting: Cardiology

## 2023-01-09 ENCOUNTER — Encounter: Payer: Self-pay | Admitting: Cardiology

## 2023-01-12 ENCOUNTER — Telehealth: Payer: Self-pay | Admitting: Adult Health

## 2023-01-12 NOTE — Telephone Encounter (Signed)
Pt came in office to get her CPAP Supplies sent in from [employer]) They last requested it back on Oct 30th and it needs to be signed  Fax: [phone]

## 2023-01-17 ENCOUNTER — Ambulatory Visit: Payer: Medicare Other | Attending: Cardiology | Admitting: Cardiology

## 2023-01-17 ENCOUNTER — Encounter: Payer: Self-pay | Admitting: Cardiology

## 2023-01-17 VITALS — BP 118/72 | HR 72 | Resp 16 | Ht 61.0 in | Wt 199.6 lb

## 2023-01-17 DIAGNOSIS — R0602 Shortness of breath: Secondary | ICD-10-CM | POA: Diagnosis not present

## 2023-01-17 DIAGNOSIS — E66812 Obesity, class 2: Secondary | ICD-10-CM | POA: Diagnosis not present

## 2023-01-17 DIAGNOSIS — I10 Essential (primary) hypertension: Secondary | ICD-10-CM

## 2023-01-17 DIAGNOSIS — I441 Atrioventricular block, second degree: Secondary | ICD-10-CM | POA: Diagnosis not present

## 2023-01-17 DIAGNOSIS — Z6837 Body mass index (BMI) 37.0-37.9, adult: Secondary | ICD-10-CM

## 2023-01-17 DIAGNOSIS — E782 Mixed hyperlipidemia: Secondary | ICD-10-CM | POA: Diagnosis not present

## 2023-01-17 DIAGNOSIS — Z950 Presence of cardiac pacemaker: Secondary | ICD-10-CM | POA: Diagnosis not present

## 2023-01-17 NOTE — Progress Notes (Signed)
Cardiology Office Note:  .   Date:  01/17/2023  ID:  MARIALUIZA Reese, DOB 1945/12/09, MRN [id number] PCP:  Ronnald Nian, MD  Former Cardiology Providers: Dr. Algie Coffer, Dr. Sharyn Lull, Dr. Jacinto Halim, and Altamese Alva, APRN, FNP-C.  [facility] Providers Cardiologist:  Tessa Lerner, DO , [facility] (established care 07/25/2019) Electrophysiologist:  None  Click to update primary MD,subspecialty MD or APP then REFRESH:1}    Chief Complaint  Patient presents with   Shortness of Breath   Follow-up    7 months    History of Present Illness: .   Margaret Reese is a 77 y.o. Caucasian female whose past medical history and cardiovascular risk factors includes: Sleep apnea on CPAP, Asthma, Hypertension, secondary AV block status post pacemaker, hyperlipidemia, postmenopausal female, advanced age, obesity.   Patient is being followed by the practice given her history of pacemaker implant due to second-degree AV block and hypertension.  Over the last 7 months patient denies any anginal chest pain or heart failure symptoms.  No near-syncope or syncopal events.  Unfortunately, her husband got diagnosed with Lewy body dementia and she has been under a lot of stress.  This is led to poor lifestyle changes and increased weight gain since last office visit.  However she is cognizant and making strong efforts with regards to weight loss again.  She is compliant with her CPAP on a regular basis and therefore her apnea burden has improved after device therapy, per patient.   Review of Systems: .   Review of Systems  Cardiovascular:  Negative for chest pain, dyspnea on exertion, leg swelling, palpitations and syncope.  Respiratory:  Positive for shortness of breath (improving).   Musculoskeletal:  Positive for arthritis and back pain.    Studies Reviewed:   Pacemaker in situ: St Jude Medical Assurity MRI  dual-chamber pacemaker for symptomatic bradycardia in March 2018   Remote dual-chamber pacemaker  transmission 03/07/2022: Longevity 3 years and 7 months.  AP 28%, VP 99%. Lead impedance and thresholds are normal.  There were 11 mode switches, brief AT, longest 26 seconds.  Normal pacemaker function.  EKG: EKG Interpretation Date/Time:  Tuesday January 17 2023 08:27:18 EST Text Interpretation: Atrial-sensed ventricular-paced rhythm with prolonged AV conduction When compared with ECG of 21-May-2016 02:45, Vent. rate has increased BY  21 BPM Confirmed by Tessa Lerner [phone]) on 01/17/2023 8:40:20 AM  Echocardiogram: July 2021: LVEF 55-60%, grade 1 diastolic dysfunction, moderate TR, see report for additional details  July 2023: LVEF 57%.  Indeterminate diastolic filling pattern, mild to moderate TR, RVSP 41 mmHg.  See report for additional details  Stress Testing: December 2016: Myocardial perfusion is normal.  Low risk study  Heart Catheterization: 04/2016 Dr. Orpah Cobb: Normal coronaries  RADIOLOGY: NA  Risk Assessment/Calculations:   NA   Labs:       Latest Ref Rng & Units 09/08/2022    1:46 PM 12/29/2021   10:47 AM 06/22/2021    9:56 AM  CBC  WBC 3.4 - 10.8 x10E3/uL 5.3  5.3  4.9   Hemoglobin 11.1 - 15.9 g/dL 84.1  32.4  40.1   Hematocrit 34.0 - 46.6 % 39.8  41.8  40.7   Platelets 150 - 450 x10E3/uL 213  221  206        Latest Ref Rng & Units 09/08/2022    1:46 PM 12/29/2021   10:47 AM 08/19/2021    1:40 PM  BMP  Glucose 70 - 99 mg/dL 80  82  BUN 8 - 27 mg/dL 17  15    Creatinine 6.57 - 1.00 mg/dL 8.46  9.62    BUN/Creat Ratio 12 - 28 23  17     Sodium 134 - 144 mmol/L 143  143    Potassium 3.5 - 5.2 mmol/L 4.3  4.4    Chloride 96 - 106 mmol/L 101  103    CO2 20 - 29 mmol/L 27  27    Calcium 8.7 - 10.3 mg/dL 95.2  84.1  9.9       Latest Ref Rng & Units 09/08/2022    1:46 PM 12/29/2021   10:47 AM 08/19/2021    1:40 PM  CMP  Glucose 70 - 99 mg/dL 80  82    BUN 8 - 27 mg/dL 17  15    Creatinine 3.24 - 1.00 mg/dL 4.01  0.27    Sodium 253 - 144 mmol/L 143   143    Potassium 3.5 - 5.2 mmol/L 4.3  4.4    Chloride 96 - 106 mmol/L 101  103    CO2 20 - 29 mmol/L 27  27    Calcium 8.7 - 10.3 mg/dL 66.4  40.3  9.9   Total Protein 6.0 - 8.5 g/dL 7.0  7.0    Total Bilirubin 0.0 - 1.2 mg/dL 0.7  0.9    Alkaline Phos 44 - 121 IU/L 96  95    AST 0 - 40 IU/L 20  24    ALT 0 - 32 IU/L 13  14      Lab Results  Component Value Date   CHOL 141 09/08/2022   HDL 68 09/08/2022   LDLCALC 59 09/08/2022   TRIG 72 09/08/2022   CHOLHDL 2.2 06/22/2021   No results for input(s): "LIPOA" in the last 8760 hours. No components found for: "NTPROBNP" No results for input(s): "PROBNP" in the last 8760 hours. Recent Labs    09/08/22 1346  TSH 2.630     Physical Exam:    Today's Vitals   01/17/23 0823  BP: 118/72  Pulse: 72  Resp: 16  SpO2: 96%  Weight: 199 lb 9.6 oz (90.5 kg)  Height: 5\' 1"  (1.549 m)   Body mass index is 37.71 kg/m. Wt Readings from Last 3 Encounters:  01/17/23 199 lb 9.6 oz (90.5 kg)  01/03/23 210 lb 9.6 oz (95.5 kg)  12/26/22 205 lb (93 kg)    Physical Exam  Constitutional: No distress.  Age appropriate, hemodynamically stable.   Neck: No JVD present.  Cardiovascular: Normal rate, regular rhythm, S1 normal, S2 normal, intact distal pulses and normal pulses. Exam reveals no gallop, no S3 and no S4.  No murmur heard. Pulmonary/Chest: Effort normal and breath sounds normal. No stridor. She has no wheezes. She has no rales.  Pacemaker site is clean dry and intact (left infraclavicular region).   Abdominal: Soft. Bowel sounds are normal. She exhibits no distension. There is no abdominal tenderness.  Musculoskeletal:        General: No edema.     Cervical back: Neck supple.  Neurological: She is alert and oriented to person, place, and time. She has intact cranial nerves (2-12).  Skin: Skin is warm and moist.     Impression & Recommendation(s):  Impression:   ICD-10-CM   1. Shortness of breath  R06.02 EKG 12-Lead    2.  Benign hypertension  I10     3. Mixed hyperlipidemia  E78.2     4. Pacemaker S/P St  Jude Medical Assurity MRI model [id number]  Z95.0     5. Mobitz type 2 second degree AV block  I44.1     6. Class 2 severe obesity due to excess calories with serious comorbidity and body mass index (BMI) of 37.0 to 37.9 in adult [facility])  Y40.347    E66.01    Z68.37        Recommendation(s):  Shortness of breath Very well-controlled with last office visit. EKG is nonischemic Medications reconciled Sleep apnea has improved after device therapy Prior echo and stress test results reviewed as part of medical decision making at today's office visit  Benign hypertension Office blood pressures are very well-controlled on current medical therapy. Currently takes diltiazem and Benicar in the morning. I have advised her to take the Benicar in the morning and diltiazem at night. Reemphasized importance of low-salt diet.  Mixed hyperlipidemia Currently on simvastatin.   She denies myalgia or other side effects. Most recent lipids dated July 2024 reviewed as noted above, LDL 59 mg/dL.  Pacemaker S/P St Jude Medical Assurity MRI model [id number] Mobitz type 2 second degree AV block Patient recently received a new transmitter. She has an appointment with the device clinic to reestablish care   Class 2 severe obesity due to excess calories with serious comorbidity and body mass index (BMI) of 37.0 to 37.9 in adult [facility]) Weight gain since last office visit. Patient is cognizant. Started to implement lifestyle changes to help facilitate weight loss again Body mass index is 37.71 kg/m. I reviewed with her importance of diet, regular physical activity/exercise, weight loss.   Patient is educated on the importance of increasing physical activity gradually as tolerated with a goal of moderate intensity exercise for 30 minutes a day 5 days a week.   Orders Placed:  Orders Placed This Encounter  Procedures   EKG  12-Lead   Final Medication List:   No orders of the defined types were placed in this encounter.   There are no discontinued medications.   Current Outpatient Medications:    albuterol (VENTOLIN HFA) 108 (90 Base) MCG/ACT inhaler, Inhale 1-2 puffs into the lungs every 6 (six) hours as needed for wheezing or shortness of breath., Disp: , Rfl:    aspirin 81 MG tablet, Take 81 mg by mouth daily., Disp: , Rfl:    diltiazem (CARDIZEM CD) 120 MG 24 hr capsule, TAKE 1 CAPSULE BY MOUTH DAILY. HOLD IF BLOOD PRESSURE NUMBER IS LESS THAN 100 MMHG, Disp: 90 capsule, Rfl: 1   fluticasone furoate-vilanterol (BREO ELLIPTA) 100-25 MCG/ACT AEPB, INHALE 1 PUFF INTO THE LUNGS DAILY, Disp: 60 each, Rfl: 11   loratadine (CLARITIN) 10 MG tablet, Take 10 mg by mouth daily as needed for allergies., Disp: , Rfl:    Multiple Vitamins-Minerals (PRESERVISION AREDS 2) CAPS, Take 1 capsule by mouth 2 (two) times daily. , Disp: , Rfl:    olmesartan (BENICAR) 20 MG tablet, TAKE 1 TABLET BY MOUTH EVERY DAY, Disp: 90 tablet, Rfl: 1   potassium citrate (UROCIT-K) 10 MEQ (1080 MG) SR tablet, Take 10 mEq by mouth 2 (two) times daily., Disp: , Rfl:    simvastatin (ZOCOR) 20 MG tablet, TAKE 1 TABLET(20 MG) BY MOUTH EVERY EVENING, Disp: 90 tablet, Rfl: 1   Vitamin D, Ergocalciferol, (DRISDOL) 1.25 MG (50000 UNIT) CAPS capsule, Take 1 capsule (50,000 Units total) by mouth every 7 (seven) days., Disp: 12 capsule, Rfl: 0  Consent:   N/A  Disposition:   1 year follow-up Patient may  be asked to follow-up sooner based on the results of the above-mentioned testing.  Her questions and concerns were addressed to her satisfaction. She voices understanding of the recommendations provided during this encounter.    Signed, Tessa Lerner, DO, [facility] [place]  [facility]  [address] [place], [place] [address] 01/17/2023 6:30 PM

## 2023-01-17 NOTE — Patient Instructions (Signed)

## 2023-01-31 ENCOUNTER — Telehealth (INDEPENDENT_AMBULATORY_CARE_PROVIDER_SITE_OTHER): Payer: Medicare Other | Admitting: Psychology

## 2023-01-31 DIAGNOSIS — F5089 Other specified eating disorder: Secondary | ICD-10-CM | POA: Diagnosis not present

## 2023-01-31 DIAGNOSIS — F419 Anxiety disorder, unspecified: Secondary | ICD-10-CM | POA: Diagnosis not present

## 2023-01-31 DIAGNOSIS — F32A Depression, unspecified: Secondary | ICD-10-CM

## 2023-01-31 NOTE — Progress Notes (Signed)
  Office: [phone]  /  Fax: [phone]    Date: January 31, 2023  Appointment Start Time: 8:36am Duration: 22 minutes Provider: Lawerance Cruel, Psy.D. Type of Session: Individual Therapy  Location of Patient: Home (private location) Location of Provider: Provider's Home (private office) Type of Contact: Telepsychological Visit via MyChart Video Visit  Session Content: Nastacia is a 77 y.o. female presenting for a follow-up appointment to address the previously established treatment goal of increasing coping skills.Today's appointment was a telepsychological visit. Kairie provided verbal consent for today's telepsychological appointment and she is aware she is responsible for securing confidentiality on her end of the session. Prior to proceeding with today's appointment, Corri's physical location at the time of this appointment was obtained as well a phone number she could be reached at in the event of technical difficulties. Marolyn and this provider participated in today's telepsychological service.   This provider conducted a brief check-in. Jenascia shared her husband was diagnosed with Alzheimer's. Associated thoughts and feelings briefly processed. Psychoeducation regarding the importance of self-care utilizing the oxygen mask metaphor was provided. Psychoeducation regarding pleasurable activities, including its impact on emotional eating and overall well-being was provided. Quintella was provided with a handout with various options of pleasurable activities, and was encouraged to engage in one activity a day and additional activities as needed when triggered to emotionally eat. Girtha agreed. Annjeanette provided verbal consent during today's appointment for this provider to send a handout with activities via e-mail.Overall, Overall, Dereka was receptive to today's appointment as evidenced by openness to sharing, responsiveness to feedback, and willingness to engage in pleasurable activities to assist with  coping.  Mental Status Examination:  Appearance: neat Behavior: appropriate to circumstances Mood: sad Affect: mood congruent; tearful at times Speech: WNL Eye Contact: appropriate Psychomotor Activity: WNL Gait: unable to assess Thought Process: linear, logical, and goal directed and no evidence or endorsement of suicidal, homicidal, and self-harm ideation, plan and intent  Thought Content/Perception: no hallucinations, delusions, bizarre thinking or behavior endorsed or observed Orientation: AAOx4 Memory/Concentration: intact Insight: fair Judgment: fair  Interventions:  Conducted a brief chart review Provided empathic reflections and validation Provided positive reinforcement Employed supportive psychotherapy interventions to facilitate reduced distress and to improve coping skills with identified stressors Psychoeducation provided regarding pleasurable activities Psychoeducation provided regarding self-care  DSM-5 Diagnosis(es):  F50.89 Other Specified Feeding or Eating Disorder, Emotional Eating Behaviors, F41.9 Unspecified Anxiety Disorder, and  F32.A Unspecified Depressive Disorder  Treatment Goal & Progress: During the initial appointment with this provider, the following treatment goal was established: increase coping skills. Progress is limited, as Enslee has just begun treatment with this provider; however, she is receptive to the interaction and interventions and rapport is being established.   Plan: The next appointment is scheduled for 02/28/2023 at 8am, which will be via MyChart Video Visit. The next session will focus on working towards the established treatment goal.

## 2023-02-01 ENCOUNTER — Encounter: Payer: Self-pay | Admitting: Cardiology

## 2023-02-01 ENCOUNTER — Ambulatory Visit: Payer: Medicare Other | Attending: Cardiology | Admitting: Cardiology

## 2023-02-01 VITALS — BP 152/88 | HR 84 | Ht 61.0 in | Wt 206.2 lb

## 2023-02-01 DIAGNOSIS — I10 Essential (primary) hypertension: Secondary | ICD-10-CM | POA: Diagnosis not present

## 2023-02-01 DIAGNOSIS — I441 Atrioventricular block, second degree: Secondary | ICD-10-CM | POA: Insufficient documentation

## 2023-02-01 NOTE — Patient Instructions (Signed)
Medication Instructions:  Your physician recommends that you continue on your current medications as directed. Please refer to the Current Medication list given to you today.  *If you need a refill on your cardiac medications before your next appointment, please call your pharmacy*   Lab Work: None ordered   Testing/Procedures: None ordered   Follow-Up: At [facility] you and your health needs are our priority.  As part of our continuing mission to provide you with exceptional heart care, we have created designated Provider Care Teams.  These Care Teams include your primary Cardiologist (physician) and Advanced Practice Providers (APPs -  Physician Assistants and Nurse Practitioners) who all work together to provide you with the care you need, when you need it.  Your next appointment:   1 month(s)  The format for your next appointment:   In Person  Provider:   You will see one of the following Advanced Practice Providers on your designated Care Team:   Francis Dowse, [place] "Mardelle Matte" [place], [place] Canary Brim, NP    Thank you for choosing [facility]!!   Dory Horn, RN [phone]

## 2023-02-01 NOTE — Progress Notes (Signed)
  Electrophysiology Office Note:   Date:  02/01/2023  ID:  Margaret Reese, DOB [birth year], MRN [id number]  Primary Cardiologist: Tessa Lerner, DO Electrophysiologist: None      History of Present Illness:   Margaret Reese is a 77 y.o. female with h/o sleep apnea on CPAP, hypertension, secondary AV block, hyperlipidemia, obesity seen today for  for Electrophysiology evaluation of degree AV block at the request of Sunit Tolia.    Today, denies symptoms of palpitations, chest pain, shortness of breath, orthopnea, PND, lower extremity edema, claudication, dizziness, presyncope, syncope, bleeding, or neurologic sequela. The patient is tolerating medications without difficulties.  She has not had any issues with her pacemaker since implantation.  She has been doing well without complaint.  She is happy with her control.  She is worried about her husband who is recently been diagnosed with Alzheimer's.  He has been having hallucinations.  Review of systems complete and found to be negative unless listed in HPI.      EP Information / Studies Reviewed:    EKG is not ordered today. EKG from 01/17/23 reviewed which showed atrial sensed, ventricular paced      PPM Interrogation-  reviewed in detail today,  See PACEART report.  Device History: Abbott Dual Chamber PPM implanted 2018 for Second Degree AV block  Risk Assessment/Calculations:             Physical Exam:   VS:  BP (!) 152/88 (BP Location: Right Arm, Patient Position: Sitting, Cuff Size: Large)   Pulse 84   Ht 5\' 1"  (1.549 m)   Wt 206 lb 3.2 oz (93.5 kg)   SpO2 96%   BMI 38.96 kg/m    Wt Readings from Last 3 Encounters:  02/01/23 206 lb 3.2 oz (93.5 kg)  01/17/23 199 lb 9.6 oz (90.5 kg)  01/03/23 210 lb 9.6 oz (95.5 kg)     GEN: Well nourished, well developed in no acute distress NECK: No JVD; No carotid bruits CARDIAC: Regular rate and rhythm, no murmurs, rubs, gallops RESPIRATORY:  Clear to auscultation without  rales, wheezing or rhonchi  ABDOMEN: Soft, non-tender, non-distended EXTREMITIES:  No edema; No deformity   ASSESSMENT AND PLAN:    Second Degree AV block s/p Abbott PPM  Normal PPM function See Pace Art report No changes today  2.  Hypertension: Today.  Usually well-controlled.  Plan per primary cardiology.  3.  Hyperlipidemia: Continue simvastatin per primary cardiology  Disposition:   Follow up with EP APP in 12 months  Signed, Javonne Louissaint Jorja Loa, MD

## 2023-02-02 LAB — CUP PACEART INCLINIC DEVICE CHECK
Battery Remaining Longevity: 32 mo
Battery Voltage: 2.95 V
Brady Statistic RA Percent Paced: 36 %
Brady Statistic RV Percent Paced: 99.94 %
Date Time Interrogation Session: 20241211093900
Implantable Lead Connection Status: 753985
Implantable Lead Connection Status: 753985
Implantable Lead Implant Date: 20180330
Implantable Lead Implant Date: 20180330
Implantable Lead Location: 753859
Implantable Lead Location: 753860
Implantable Pulse Generator Implant Date: 20180330
Lead Channel Impedance Value: 400 Ohm
Lead Channel Impedance Value: 462.5 Ohm
Lead Channel Pacing Threshold Amplitude: 0.75 V
Lead Channel Pacing Threshold Amplitude: 0.75 V
Lead Channel Pacing Threshold Amplitude: 0.75 V
Lead Channel Pacing Threshold Amplitude: 0.75 V
Lead Channel Pacing Threshold Pulse Width: 0.5 ms
Lead Channel Pacing Threshold Pulse Width: 0.5 ms
Lead Channel Pacing Threshold Pulse Width: 0.5 ms
Lead Channel Pacing Threshold Pulse Width: 0.5 ms
Lead Channel Sensing Intrinsic Amplitude: 4.4 mV
Lead Channel Setting Pacing Amplitude: 2 V
Lead Channel Setting Pacing Amplitude: 2.5 V
Lead Channel Setting Pacing Pulse Width: 0.5 ms
Lead Channel Setting Sensing Sensitivity: 4 mV
Pulse Gen Model: 2272
Pulse Gen Serial Number: 7998534

## 2023-02-07 ENCOUNTER — Encounter (INDEPENDENT_AMBULATORY_CARE_PROVIDER_SITE_OTHER): Payer: Self-pay | Admitting: Adult Health

## 2023-02-07 ENCOUNTER — Telehealth (INDEPENDENT_AMBULATORY_CARE_PROVIDER_SITE_OTHER): Payer: Self-pay | Admitting: Physician Assistant

## 2023-02-07 ENCOUNTER — Encounter (INDEPENDENT_AMBULATORY_CARE_PROVIDER_SITE_OTHER): Payer: Self-pay | Admitting: Physician Assistant

## 2023-02-07 ENCOUNTER — Ambulatory Visit (INDEPENDENT_AMBULATORY_CARE_PROVIDER_SITE_OTHER): Payer: Medicare Other | Admitting: Physician Assistant

## 2023-02-07 VITALS — BP 137/72 | HR 86 | Temp 98.3°F | Ht 61.0 in | Wt 204.0 lb

## 2023-02-07 DIAGNOSIS — E559 Vitamin D deficiency, unspecified: Secondary | ICD-10-CM

## 2023-02-07 DIAGNOSIS — E785 Hyperlipidemia, unspecified: Secondary | ICD-10-CM

## 2023-02-07 DIAGNOSIS — Z6838 Body mass index (BMI) 38.0-38.9, adult: Secondary | ICD-10-CM

## 2023-02-07 DIAGNOSIS — F439 Reaction to severe stress, unspecified: Secondary | ICD-10-CM

## 2023-02-07 DIAGNOSIS — I10 Essential (primary) hypertension: Secondary | ICD-10-CM | POA: Diagnosis not present

## 2023-02-07 DIAGNOSIS — G4733 Obstructive sleep apnea (adult) (pediatric): Secondary | ICD-10-CM | POA: Diagnosis not present

## 2023-02-07 DIAGNOSIS — E669 Obesity, unspecified: Secondary | ICD-10-CM

## 2023-02-07 DIAGNOSIS — E7800 Pure hypercholesterolemia, unspecified: Secondary | ICD-10-CM

## 2023-02-07 MED ORDER — VITAMIN D (ERGOCALCIFEROL) 1.25 MG (50000 UNIT) PO CAPS: 50000.0000 [IU] | ORAL_CAPSULE | ORAL | 0 refills | Status: DC

## 2023-02-07 NOTE — Telephone Encounter (Signed)
12/17 Insurance verified by website. [employer]

## 2023-02-07 NOTE — Progress Notes (Signed)
SUBJECTIVE:  Chief Complaint: Obesity  Interim History: Patient is down 1 lb since last visit.   Margaret Reese is a 77 year old individual with a history of obesity, mild hyperglycemia with polyphagia, emotional eating behaviors, vitamin D deficiency, hypercholesterolemia, hypertension, and mild diastolic heart dysfunction.  The patient has been struggling with stress due to her spouse's recent Alzheimer's diagnosis, which has exacerbated emotional eating behaviors.  The patient reported a recent weight gain, reaching 211 pounds, but managed to lose 12 pounds through concerted effort. However, the stress of multiple doctor visits and preparing for a party led to a lapse in her diet. The patient also reported inconsistent sleep patterns, often waking up at 4:30 AM to exercise but not eating breakfast until late in the morning, leading to feelings of sickness. We discussed the importance of regular meals and adequate protein intake for weight loss and overall health.   The patient's blood pressure has been fluctuating, with readings as low as 98 and as high as 155. Her medication regimen was adjusted by her cardiologist to one blood pressure pill in the morning and one at night to manage these fluctuations. Her BP is in acceptable range today, BP 137/72.  The patient also uses a breathing machine for sleep apnea, but recent issues with obtaining new supplies led to inconsistent use. She has obtained supplies now and we discussed the importance of consistent CPAP use.   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 This has caused additional stress, as evidenced by a spike in blood pressure to 155 during a recent drive to a doctor's appointment.  The patient expressed a desire to establish a more consistent daily routine, including regular meal times and increased physical activity, such  as walking. She also expressed a need for easier meal preparation options to ensure adequate protein intake and manage her weight.  In summary, the patient is dealing with multiple health issues, including obesity, hypertension, and hyperglycemia, which are being exacerbated by significant life stressors, including her spouse's Alzheimer's diagnosis. The patient is seeking strategies to manage her health more effectively, including dietary changes, increased physical activity, and improved sleep patterns.  Margaret Reese is here to discuss her progress with her obesity treatment plan. She is on the Category 1 Plan and states she is not following her eating plan approximately 0 % of the time. She states she is exercising doing low impact aerobics for 30-40 minutes 6 times per week.  Plan fasting labs next visit.   OBJECTIVE: Visit Diagnoses: Problem List Items Addressed This Visit     Essential hypertension   Hyperlipidemia   Vitamin D deficiency   Relevant Medications   Vitamin D, Ergocalciferol, (DRISDOL) 1.25 MG (50000 UNIT) CAPS capsule   Obesity (HCC)- Start BMI 40.06   BMI 38.0-38.9,adult   Stress - Primary   Other Visit Diagnoses       OSA on CPAP          Obesity 77 year old weighing 211 lbs, lost 12 lbs recently. Emotional eating and stress are contributing factors. Discussed routine to manage emotional eating and benefits of high-protein, easy-prep meals. Encouraged regular physical activity, such as walking. 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 This has caused additional stress, as evidenced by a spike in blood pressure to  155 during a recent drive to a doctor's appointment. She reports comfort eating and has been working with Dr. Dewaine Conger more closely over the past couple of months to address emotional eating behaviors.  The patient expressed a desire to establish a more consistent daily routine, including regular meal times and increased physical activity, such as walking. She also expressed a need for easier meal preparation options to ensure adequate protein intake and manage her weight. We discussed using prepared healthier options to make meal planning and prepping easier and more consistent and reinforced goal of at least 75 grams of protein daily.   Hypertension Recent follow up with cardiology- Dr. Elberta Fortis 02/01/23 Blood pressure fluctuating between 98 and 155 mmHg systolic. Current medication regimen adjusted to one pill in the morning and one at night. Discussed dietary modifications to reduce sodium intake. - Continue current blood pressure medication regimen- cardizem CD 120 mg daily, Olmesartan 20 mg every evening daily.  - Monitor blood pressure regularly - Encourage dietary modifications to reduce sodium intake Mild diastolic heart dysfunction/Second degree AV block. Recent heart rate 86 bpm, blood pressure 137/72 mmHg. Discussed adherence to a heart-healthy diet and regular physical activity. - Continue monitoring heart rate and blood pressure - Encourage adherence to heart-healthy diet and regular physical activity   Hypercholesterolemia The 10-year ASCVD risk score (Arnett DK, et al., 2019) is: 32.7%   Values used to calculate the score:     Age: 77 years     Sex: Female     Is Non-Hispanic African American: No     Diabetic: No     Tobacco smoker: No     Systolic Blood Pressure: 137 mmHg     Is BP treated: Yes     HDL Cholesterol: 68 mg/dL     Total Cholesterol: 141 mg/dL  On Zocor 20 mg daily and reports no side effects.   Plan to order fasting lipid panel at next visit. - Order fasting  lipid panel at next visit - Encourage dietary modifications to manage cholesterol levels  Vitamin D Deficiency Last recorded vitamin D level was 39 ng/mL. Plan to refill vitamin D prescription and order a vitamin D level test at the beginning of the year. - Refill vitamin D prescription- Ergocalciferol 50,000 units weekly - Order vitamin D level test at the beginning of the year Low vitamin D levels can be associated with adiposity and may result in leptin resistance and weight gain. Also associated with fatigue. Currently on vitamin D supplementation without any adverse effects.    OSA using CPAP  She was having difficultly getting supplies, but has obtained now. Discuss importance of good CPAP compliance for overall health/cardiac health and weight loss.   General Health Maintenance None - Encourage adequate hydration - Ensure adherence to CPAP therapy for sleep apnea - Schedule fasting labs for lipid panel and vitamin D level at next visit in addition to usual fasting labs.  - Follow up with primary care physician for comprehensive care  Follow-up - Schedule follow-up appointment for February 4th at 9:00 AM - Plan for fasting labs at the next visit - Ensure adequate hydration before fasting labs. Vitals Temp: 98.3 F (36.8 C) BP: 137/72 Pulse Rate: 86 SpO2: 97 %   Anthropometric Measurements Height: 5\' 1"  (1.549 m) Weight: 204 lb (92.5 kg) BMI (Calculated):  38.57 Weight at Last Visit: 205 lb Weight Lost Since Last Visit: 1 lb Weight Gained Since Last Visit: 0 Starting Weight: 212 lb Total Weight Loss (lbs): 8 lb (3.629 kg) Peak Weight: 307 lb   Body Composition  Body Fat %: 51.2 % Fat Mass (lbs): 104.8 lbs Muscle Mass (lbs): 104.8 lbs Visceral Fat Rating : 18   Other Clinical Data Fasting: yes Labs: no Today's Visit #: 54 Starting Date: 03/26/19     ASSESSMENT AND PLAN:  Diet: Fareeha is currently in the action stage of change. As such, her goal is to  continue with weight loss efforts. She has agreed to Category 1 Plan.  Exercise: Linsy has been instructed to try a geriatric exercise plan and that some exercise is better than none for weight loss and overall health benefits.   Behavior Modification:  We discussed the following Behavioral Modification Strategies today: increasing lean protein intake, decreasing simple carbohydrates, increasing vegetables, increase H2O intake, no skipping meals, meal planning and cooking strategies, better snacking choices, emotional eating strategies , holiday eating strategies, and planning for success. We discussed various medication options to help Aliahna with her weight loss efforts and we both agreed to continue to work on nutritional and behavioral strategies to promote weight loss.  .  Return in about 4 weeks (around 03/07/2023) for Fasting Lab.Marland Kitchen She was informed of the importance of frequent follow up visits to maximize her success with intensive lifestyle modifications for her multiple health conditions.  Attestation Statements:   Reviewed by clinician on day of visit: allergies, medications, problem list, medical history, surgical history, family history, social history, and previous encounter notes.   Time spent on visit including pre-visit chart review and post-visit care and charting was 49 minutes.    Jadence Kinlaw, PA-C

## 2023-02-08 ENCOUNTER — Other Ambulatory Visit: Payer: Self-pay | Admitting: Cardiology

## 2023-02-08 DIAGNOSIS — R002 Palpitations: Secondary | ICD-10-CM

## 2023-02-28 ENCOUNTER — Telehealth (INDEPENDENT_AMBULATORY_CARE_PROVIDER_SITE_OTHER): Payer: Medicare Other | Admitting: Psychology

## 2023-02-28 DIAGNOSIS — F419 Anxiety disorder, unspecified: Secondary | ICD-10-CM

## 2023-02-28 DIAGNOSIS — F32A Depression, unspecified: Secondary | ICD-10-CM

## 2023-02-28 DIAGNOSIS — F5089 Other specified eating disorder: Secondary | ICD-10-CM

## 2023-02-28 NOTE — Progress Notes (Signed)
  Office: [phone]  /  Fax: [phone]    Date: February 28, 2023  Appointment Start Time: 8:00am Duration: 24 minutes Provider: Wyatt Fire, Psy.D. Type of Session: Individual Therapy  Location of Patient: Home (private location) Location of Provider: Provider's Home (private office) Type of Contact: Telepsychological Visit via MyChart Video Visit  Session Content: Eva is a 78 y.o. female presenting for a follow-up appointment to address the previously established treatment goal of increasing coping skills.Today's appointment was a telepsychological visit. Enda provided verbal consent for today's telepsychological appointment and she is aware she is responsible for securing confidentiality on her end of the session. Prior to proceeding with today's appointment, Jania's physical location at the time of this appointment was obtained as well a phone number she could be reached at in the event of technical difficulties. Latajah and this provider participated in today's telepsychological service.   This provider conducted a brief check-in. Kenni shared about recent events, noting, the holidays were better than expected. She further shared updates regarding her husband's health. Reviewed recent eating habits. She acknowledged, Not good. Today's appointment focused on helping Nazirah adhere to her goals set forth with the clinic. Psychoeducation provided regarding all or nothing thinking. Additionally, psychoeducation regarding SMART goals was provided and Vonna was engaged in goal setting. The following goal was established: Cassondra will eat lunch congruent to her prescribed structured meal plan at least 3 out of 7 days a week between now and the next appointment with this provider. Overall, Quinci was receptive to today's appointment as evidenced by openness to sharing, responsiveness to feedback, and willingness to work toward the established SMART goal.  Mental Status Examination:  Appearance:  neat Behavior: appropriate to circumstances Mood: neutral Affect: mood congruent Speech: WNL Eye Contact: appropriate Psychomotor Activity: WNL Gait: unable to assess Thought Process: linear, logical, and goal directed and no evidence or endorsement of suicidal, homicidal, and self-harm ideation, plan and intent  Thought Content/Perception: no hallucinations, delusions, bizarre thinking or behavior endorsed or observed Orientation: AAOx4 Memory/Concentration: intact Insight: fair Judgment: fair  Interventions:  Conducted a brief chart review Provided empathic reflections and validation Employed supportive psychotherapy interventions to facilitate reduced distress and to improve coping skills with identified stressors Engaged patient in goal setting Psychoeducation provided regarding SMART goals Psychoeducation provided regarding all or nothing thinking   DSM-5 Diagnosis(es):  F50.89 Other Specified Feeding or Eating Disorder, Emotional Eating Behaviors, F41.9 Unspecified Anxiety Disorder, and  F32.A Unspecified Depressive Disorder  Treatment Goal & Progress: During the initial appointment with this provider, the following treatment goal was established: increase coping skills. Sarrinah has demonstrated progress in her goal as evidenced by increased awareness of hunger patterns.   Plan: Per Marelyn's request, the next appointment is scheduled for 04/04/2023 at 8am, which will be via MyChart Video Visit. The next session will focus on working towards the established treatment goal.   Wyatt Fire, PsyD

## 2023-03-10 ENCOUNTER — Other Ambulatory Visit: Payer: Self-pay | Admitting: Nurse Practitioner

## 2023-03-10 DIAGNOSIS — E785 Hyperlipidemia, unspecified: Secondary | ICD-10-CM

## 2023-03-23 DIAGNOSIS — N281 Cyst of kidney, acquired: Secondary | ICD-10-CM | POA: Diagnosis not present

## 2023-03-23 DIAGNOSIS — N200 Calculus of kidney: Secondary | ICD-10-CM | POA: Diagnosis not present

## 2023-03-28 ENCOUNTER — Ambulatory Visit (INDEPENDENT_AMBULATORY_CARE_PROVIDER_SITE_OTHER): Payer: Medicare Other | Admitting: Physician Assistant

## 2023-03-28 ENCOUNTER — Encounter (INDEPENDENT_AMBULATORY_CARE_PROVIDER_SITE_OTHER): Payer: Self-pay | Admitting: Physician Assistant

## 2023-03-28 VITALS — BP 143/73 | HR 97 | Temp 98.2°F | Ht 61.0 in | Wt 198.0 lb

## 2023-03-28 DIAGNOSIS — E785 Hyperlipidemia, unspecified: Secondary | ICD-10-CM

## 2023-03-28 DIAGNOSIS — R5383 Other fatigue: Secondary | ICD-10-CM

## 2023-03-28 DIAGNOSIS — E559 Vitamin D deficiency, unspecified: Secondary | ICD-10-CM

## 2023-03-28 DIAGNOSIS — R739 Hyperglycemia, unspecified: Secondary | ICD-10-CM | POA: Diagnosis not present

## 2023-03-28 DIAGNOSIS — Z6837 Body mass index (BMI) 37.0-37.9, adult: Secondary | ICD-10-CM

## 2023-03-28 DIAGNOSIS — F439 Reaction to severe stress, unspecified: Secondary | ICD-10-CM | POA: Diagnosis not present

## 2023-03-28 DIAGNOSIS — E669 Obesity, unspecified: Secondary | ICD-10-CM | POA: Diagnosis not present

## 2023-03-28 DIAGNOSIS — E7800 Pure hypercholesterolemia, unspecified: Secondary | ICD-10-CM | POA: Diagnosis not present

## 2023-03-28 DIAGNOSIS — I10 Essential (primary) hypertension: Secondary | ICD-10-CM

## 2023-03-28 NOTE — Progress Notes (Addendum)
 SUBJECTIVE:  Chief Complaint: Obesity  Interim History: She is down 6 lbs since her last visit.   Nusaiba G Kaczynski is a 78 year old female who presents for follow-up of her obesity treatment plan.  She has lost six pounds over the past couple of  months, attributing this success to implementing a routine that includes meal planning and preparation. She has been consistent with her meal planning, which has helped her avoid unhealthy snacking and reduce food waste. Her husband has also lost a few pounds as a result of these changes.  She has a history of hypertension and hypercholesterolemia. She reports her blood pressure was 103 when she left home, although it was slightly elevated due to stress from driving.  She has obstructive sleep apnea and uses a CPAP machine. She wakes up at 2:30 AM, which she attributes to the CPAP machine. She has been provided with a device to keep her mouth closed but finds it difficult to use due to sizing issues.  She has been under significant stress due to her husband's recent diagnosis of Alzheimer's disease. This has impacted her routine and stress levels, but she has been working on establishing a routine to manage her stress better.  Edye is here to discuss her progress with her obesity treatment plan. She is on the Category 1 Plan and states she is following her eating plan approximately 50 % of the time. She states she is exercising stretching/low impact aerobics 45 minutes 5/2 times per week.  Fasting labs obtained today.  She was informed we would discuss her lab results at her next visit unless there is a critical issue that needs to be addressed sooner. She agreed to keep her next visit at the agreed upon time to discuss these results.   OBJECTIVE: Visit Diagnoses: Problem List Items Addressed This Visit     Essential hypertension - Primary   Relevant Orders   CMP14+EGFR   Hyperlipidemia   Relevant Orders   Lipid Panel With LDL/HDL Ratio    Vitamin D  deficiency   Relevant Orders   VITAMIN D  25 Hydroxy (Vit-D Deficiency, Fractures)   Hyperglycemia   Relevant Orders   Hemoglobin A1c   Insulin , random   Obesity (HCC)- Start BMI 40.06   Stress   Other Visit Diagnoses       Fatigue, unspecified type       Relevant Orders   Vitamin B12   CBC with Differential/Platelet   TSH     BMI 37.0-37.9, adult Current BMI 37.5         Obesity with hyperglycemia Genelle Bayard has lost 6 pounds over the past few months, totaling 14 pounds overall. She has been effectively managing her weight through meal planning and a routine. Discussed the benefits of meal planning in reducing stress and improving dietary habits. She is also trying the MIND diet to support her husband's Alzheimer's diagnosis. - Continue meal planning and routine - Encourage continued weight loss efforts   Hypertension Blood pressure was 103 systolic  at home but slightly elevated upon arrival due to stress from driving. Discussed the importance of regular monitoring and managing stress. - Monitor blood pressure regularly - Continue current antihypertensive regimen Continue to work on nutrition plan to promote weight loss and improve BP control.    Hypercholesterolemia Patient reports cardiologist requested a cholesterol check.The 10-year ASCVD risk score (Arnett DK, et al., 2019) is: 35.1% She is on zocor  20 mg daily. No side effects.  Continue to  work on engineer, technical sales -decreasing simple carbohydrates, increasing lean proteins, decreasing saturated fats and cholesterol , avoiding trans fats and exercise as able to promote weight loss, improve lipids and decrease cardiovascular risks.  - Order lipid panel along with other labs today.  - Consider lipoprotein A test  Obstructive Sleep Apnea Managed with CPAP. Issues with CPAP mask fit were discussed. Advised to consult with the CPAP provider for a proper fitting mask to improve compliance and effectiveness. -  Adjust CPAP mask size - Consult with CPAP provider for proper fitting  Vitamin D  Deficiency She is on Ergocalciferol  50,000 units weekly. No N/V or muscle weakness with Ergocalciferol .  - Order vitamin D  level and adjust supplementation accordingly.  Low vitamin D  levels can be associated with adiposity and may result in leptin resistance and weight gain. Also associated with fatigue. Currently on vitamin D  supplementation without any adverse effects.    Fatigue:  May be multifactorial.  Recheck labs today including CBC, B 12, vitamin D  and TSH.   Stress with emotional eating: She has been working with Dr. Sharron and reports that she has been doing very well with meal planning and prepping ideas provided by Dr. Sharron.  She plans to continue this strategy as it appears to be helping her manage and avoid temptations.  Continue with Dr. Sharron to work on emotional eating strategies.   General Health Maintenance Routine health maintenance and screenings are necessary. - Order CBC, hemoglobin A1c, insulin  level, thyroid  function test, B12 level, and comprehensive metabolic panel  Follow-up - Follow-up appointment on March 20th at 9:00 AM.  Vitals Temp: 98.2 F (36.8 C) BP: (!) 143/73 Pulse Rate: 97 SpO2: 97 %   Anthropometric Measurements Height: 5' 1 (1.549 m) Weight: 198 lb (89.8 kg) BMI (Calculated): 37.43 Weight at Last Visit: 204 lb Weight Lost Since Last Visit: 6 lb Weight Gained Since Last Visit: 0 Starting Weight: 212 lb Total Weight Loss (lbs): 14 lb (6.35 kg) Peak Weight: 307 lb   Body Composition  Body Fat %: 49.7 % Fat Mass (lbs): 98.6 lbs Muscle Mass (lbs): 94.8 lbs Total Body Water (lbs): 74.8 lbs Visceral Fat Rating : 17   Other Clinical Data Fasting: yes Labs: yes Today's Visit #: 44 Starting Date: 03/26/19     ASSESSMENT AND PLAN:  Diet: Katerina is currently in the action stage of change. As such, her goal is to continue with weight loss  efforts. She has agreed to Category 1 Plan.  Exercise: Madalin has been instructed to continue exercising as is for weight loss and overall health benefits.   Behavior Modification:  We discussed the following Behavioral Modification Strategies today: increasing lean protein intake, decreasing simple carbohydrates, increasing vegetables, increase H2O intake, increase high fiber foods, meal planning and cooking strategies, keeping healthy foods in the home, better snacking choices, emotional eating strategies , avoiding temptations, and planning for success. We discussed various medication options to help Paisli with her weight loss efforts and we both agreed to continue to work on nutritional and behavioral strategies to promote weight loss.  .  Return in about 6 weeks (around 05/09/2023).SABRA She was informed of the importance of frequent follow up visits to maximize her success with intensive lifestyle modifications for her multiple health conditions.  Attestation Statements:   Reviewed by clinician on day of visit: allergies, medications, problem list, medical history, surgical history, family history, social history, and previous encounter notes.   Time spent on visit including pre-visit chart review and  post-visit care and charting was 38 minutes.    Francetta Ilg, PA-C

## 2023-03-29 LAB — CMP14+EGFR
ALT: 10 [IU]/L (ref 0–32)
AST: 18 [IU]/L (ref 0–40)
Albumin: 4.5 g/dL (ref 3.8–4.8)
Alkaline Phosphatase: 124 [IU]/L — ABNORMAL HIGH (ref 44–121)
BUN/Creatinine Ratio: 24 (ref 12–28)
BUN: 21 mg/dL (ref 8–27)
Bilirubin Total: 0.8 mg/dL (ref 0.0–1.2)
CO2: 26 mmol/L (ref 20–29)
Calcium: 10.4 mg/dL — ABNORMAL HIGH (ref 8.7–10.3)
Chloride: 103 mmol/L (ref 96–106)
Creatinine, Ser: 0.87 mg/dL (ref 0.57–1.00)
Globulin, Total: 2.4 g/dL (ref 1.5–4.5)
Glucose: 77 mg/dL (ref 70–99)
Potassium: 4.3 mmol/L (ref 3.5–5.2)
Sodium: 142 mmol/L (ref 134–144)
Total Protein: 6.9 g/dL (ref 6.0–8.5)
eGFR: 68 mL/min/{1.73_m2} (ref >59)

## 2023-03-29 LAB — CBC WITH DIFFERENTIAL/PLATELET
Basophils Absolute: 0.1 10*3/uL (ref 0.0–0.2)
Basos: 1 %
EOS (ABSOLUTE): 0.2 10*3/uL (ref 0.0–0.4)
Eos: 3 %
Hematocrit: 39.8 % (ref 34.0–46.6)
Hemoglobin: 12.8 g/dL (ref 11.1–15.9)
Immature Grans (Abs): 0 10*3/uL (ref 0.0–0.1)
Immature Granulocytes: 0 %
Lymphocytes Absolute: 1.9 10*3/uL (ref 0.7–3.1)
Lymphs: 33 %
MCH: 28.4 pg (ref 26.6–33.0)
MCHC: 32.2 g/dL (ref 31.5–35.7)
MCV: 88 fL (ref 79–97)
Monocytes Absolute: 0.4 10*3/uL (ref 0.1–0.9)
Monocytes: 7 %
Neutrophils Absolute: 3.3 10*3/uL (ref 1.4–7.0)
Neutrophils: 56 %
Platelets: 218 10*3/uL (ref 150–450)
RBC: 4.5 x10E6/uL (ref 3.77–5.28)
RDW: 13.5 % (ref 11.7–15.4)
WBC: 5.8 10*3/uL (ref 3.4–10.8)

## 2023-03-29 LAB — LIPID PANEL WITH LDL/HDL RATIO
Cholesterol, Total: 137 mg/dL (ref 100–199)
HDL: 60 mg/dL (ref >39)
LDL Chol Calc (NIH): 63 mg/dL (ref 0–99)
LDL/HDL Ratio: 1.1 {ratio} (ref 0.0–3.2)
Triglycerides: 69 mg/dL (ref 0–149)
VLDL Cholesterol Cal: 14 mg/dL (ref 5–40)

## 2023-03-29 LAB — TSH: TSH: 2.98 u[IU]/mL (ref 0.450–4.500)

## 2023-03-29 LAB — INSULIN, RANDOM: INSULIN: 7.9 u[IU]/mL (ref 2.6–24.9)

## 2023-03-29 LAB — HEMOGLOBIN A1C
Est. average glucose Bld gHb Est-mCnc: 114 mg/dL
Hgb A1c MFr Bld: 5.6 % (ref 4.8–5.6)

## 2023-03-29 LAB — VITAMIN D 25 HYDROXY (VIT D DEFICIENCY, FRACTURES): Vit D, 25-Hydroxy: 35.9 ng/mL (ref 30.0–100.0)

## 2023-03-29 LAB — VITAMIN B12: Vitamin B-12: 1081 pg/mL (ref 232–1245)

## 2023-04-04 ENCOUNTER — Telehealth (INDEPENDENT_AMBULATORY_CARE_PROVIDER_SITE_OTHER): Payer: Medicare Other | Admitting: Psychology

## 2023-04-04 DIAGNOSIS — F419 Anxiety disorder, unspecified: Secondary | ICD-10-CM | POA: Diagnosis not present

## 2023-04-04 DIAGNOSIS — F32A Depression, unspecified: Secondary | ICD-10-CM

## 2023-04-04 DIAGNOSIS — F5089 Other specified eating disorder: Secondary | ICD-10-CM

## 2023-04-04 NOTE — Progress Notes (Signed)
  Office: [phone]  /  Fax: [phone]    Date: April 04, 2023  Appointment Start Time: 8:00am Duration: 33 minutes Provider: Lawerance Cruel, Psy.D. Type of Session: Individual Therapy  Location of Patient: Home (private location) Location of Provider: Provider's Home (private office) Type of Contact: Telepsychological Visit via MyChart Video Visit  Session Content: Margaret Reese is a 78 y.o. female presenting for a follow-up appointment to address the previously established treatment goal of increasing coping skills.Today's appointment was a telepsychological visit. [name] provided verbal consent for today's telepsychological appointment and she is aware she is responsible for securing confidentiality on her end of the session. Prior to proceeding with today's appointment, [name]'s physical location at the time of this appointment was obtained as well a phone number she could be reached at in the event of technical difficulties. [name] and this provider participated in today's telepsychological service.   This provider conducted a brief check-in. [name] shared about recent events, including her husband's health. Associated thoughts and feelings were processed. [name] further shared she is meal planning for the day every morning, and described it as "very helpful." Additionally, she reported meeting her SMART goal for eating the lunch portion of her structured meal plan. Notably, she continues to report challenges with sugar cravings. Psychoeducation regarding triggers for emotional eating was provided. [name] was provided a handout, and encouraged to utilize the handout between now and the next appointment to increase awareness of triggers and frequency. [name] agreed. This provider also discussed behavioral strategies for specific triggers, such as placing the utensil down when conversing to avoid mindless eating. [name] provided verbal consent during today's appointment for this provider to send a handout  about triggers via e-mail. Furthermore, psychoeducation provided grounding exercises. She was engaged in an exercise (5-4-3-2-1). [name] provided verbal consent during today's appointment for this provider to send a handout for the grounding exercise via e-mail. Overall, [name] was receptive to today's appointment as evidenced by openness to sharing, responsiveness to feedback, and willingness to explore triggers for emotional eating and engage in the grounding exercise.  Mental Status Examination:  Appearance: neat Behavior: appropriate to circumstances Mood: sad Affect: mood congruent Speech: WNL Eye Contact: appropriate Psychomotor Activity: WNL Gait: unable to assess Thought Process: linear, logical, and goal directed and no evidence or endorsement of suicidal, homicidal, and self-harm ideation, plan and intent  Thought Content/Perception: no hallucinations, delusions, bizarre thinking or behavior endorsed or observed Orientation: AAOx4 Memory/Concentration: intact Insight: fair Judgment: fair  Interventions:  Conducted a brief chart review Provided empathic reflections and validation Reviewed content from the previous session Provided positive reinforcement Employed supportive psychotherapy interventions to facilitate reduced distress and to improve coping skills with identified stressors Psychoeducation provided regarding triggers for emotional eating behaviors Psychoeducation provided regarding grounding exercises Engaged pt in a grounding exercise   DSM-5 Diagnosis(es):  F50.89 Other Specified Feeding or Eating Disorder, Emotional Eating Behaviors, F41.9 Unspecified Anxiety Disorder, and  F32.A Unspecified Depressive Disorder  Treatment Goal & Progress: During the initial appointment with this provider, the following treatment goal was established: increase coping skills. [name] has demonstrated progress in her goal as evidenced by increased awareness of hunger patterns. [name] also  continues to demonstrate willingness to engage in learned skill(s).  Plan: Per [name]'s request, the next appointment is scheduled for 05/01/2023 at 8:30am, which will be via MyChart Video Visit. The next session will focus on working towards the established treatment goal.    Lawerance Cruel, PsyD

## 2023-04-18 ENCOUNTER — Ambulatory Visit (INDEPENDENT_AMBULATORY_CARE_PROVIDER_SITE_OTHER): Payer: Medicare Other

## 2023-04-18 ENCOUNTER — Encounter: Payer: Self-pay | Admitting: Internal Medicine

## 2023-04-18 DIAGNOSIS — I441 Atrioventricular block, second degree: Secondary | ICD-10-CM | POA: Diagnosis not present

## 2023-04-19 LAB — CUP PACEART REMOTE DEVICE CHECK
Battery Remaining Longevity: 30 mo
Battery Remaining Percentage: 31 %
Battery Voltage: 2.93 V
Brady Statistic AP VP Percent: 38 %
Brady Statistic AP VS Percent: 1 %
Brady Statistic AS VP Percent: 62 %
Brady Statistic AS VS Percent: 1 %
Brady Statistic RA Percent Paced: 38 %
Brady Statistic RV Percent Paced: 99 %
Date Time Interrogation Session: 20250225020015
Implantable Lead Connection Status: 753985
Implantable Lead Connection Status: 753985
Implantable Lead Implant Date: 20180330
Implantable Lead Implant Date: 20180330
Implantable Lead Location: 753859
Implantable Lead Location: 753860
Implantable Pulse Generator Implant Date: 20180330
Lead Channel Impedance Value: 400 Ohm
Lead Channel Impedance Value: 460 Ohm
Lead Channel Pacing Threshold Amplitude: 0.75 V
Lead Channel Pacing Threshold Amplitude: 0.75 V
Lead Channel Pacing Threshold Pulse Width: 0.5 ms
Lead Channel Pacing Threshold Pulse Width: 0.5 ms
Lead Channel Sensing Intrinsic Amplitude: 10.3 mV
Lead Channel Sensing Intrinsic Amplitude: 5 mV
Lead Channel Setting Pacing Amplitude: 2 V
Lead Channel Setting Pacing Amplitude: 2.5 V
Lead Channel Setting Pacing Pulse Width: 0.5 ms
Lead Channel Setting Sensing Sensitivity: 4 mV
Pulse Gen Model: 2272
Pulse Gen Serial Number: 7998534

## 2023-05-01 ENCOUNTER — Telehealth (INDEPENDENT_AMBULATORY_CARE_PROVIDER_SITE_OTHER): Payer: Medicare Other | Admitting: Psychology

## 2023-05-01 ENCOUNTER — Other Ambulatory Visit (INDEPENDENT_AMBULATORY_CARE_PROVIDER_SITE_OTHER): Payer: Self-pay | Admitting: Physician Assistant

## 2023-05-01 DIAGNOSIS — E559 Vitamin D deficiency, unspecified: Secondary | ICD-10-CM

## 2023-05-09 DIAGNOSIS — H1131 Conjunctival hemorrhage, right eye: Secondary | ICD-10-CM | POA: Diagnosis not present

## 2023-05-09 DIAGNOSIS — H348112 Central retinal vein occlusion, right eye, stable: Secondary | ICD-10-CM | POA: Diagnosis not present

## 2023-05-09 DIAGNOSIS — Z961 Presence of intraocular lens: Secondary | ICD-10-CM | POA: Diagnosis not present

## 2023-05-09 DIAGNOSIS — D3131 Benign neoplasm of right choroid: Secondary | ICD-10-CM | POA: Diagnosis not present

## 2023-05-09 DIAGNOSIS — H04123 Dry eye syndrome of bilateral lacrimal glands: Secondary | ICD-10-CM | POA: Diagnosis not present

## 2023-05-09 DIAGNOSIS — H353121 Nonexudative age-related macular degeneration, left eye, early dry stage: Secondary | ICD-10-CM | POA: Diagnosis not present

## 2023-05-09 DIAGNOSIS — H31091 Other chorioretinal scars, right eye: Secondary | ICD-10-CM | POA: Diagnosis not present

## 2023-05-09 DIAGNOSIS — H10413 Chronic giant papillary conjunctivitis, bilateral: Secondary | ICD-10-CM | POA: Diagnosis not present

## 2023-05-11 ENCOUNTER — Ambulatory Visit (INDEPENDENT_AMBULATORY_CARE_PROVIDER_SITE_OTHER): Payer: Medicare Other | Admitting: Physician Assistant

## 2023-05-11 ENCOUNTER — Encounter (INDEPENDENT_AMBULATORY_CARE_PROVIDER_SITE_OTHER): Payer: Self-pay | Admitting: Physician Assistant

## 2023-05-11 VITALS — BP 145/82 | HR 97 | Temp 97.4°F | Ht 61.0 in | Wt 195.0 lb

## 2023-05-11 DIAGNOSIS — E669 Obesity, unspecified: Secondary | ICD-10-CM

## 2023-05-11 DIAGNOSIS — I10 Essential (primary) hypertension: Secondary | ICD-10-CM | POA: Diagnosis not present

## 2023-05-11 DIAGNOSIS — F439 Reaction to severe stress, unspecified: Secondary | ICD-10-CM | POA: Diagnosis not present

## 2023-05-11 DIAGNOSIS — Z6837 Body mass index (BMI) 37.0-37.9, adult: Secondary | ICD-10-CM | POA: Diagnosis not present

## 2023-05-11 DIAGNOSIS — E559 Vitamin D deficiency, unspecified: Secondary | ICD-10-CM | POA: Diagnosis not present

## 2023-05-11 DIAGNOSIS — E785 Hyperlipidemia, unspecified: Secondary | ICD-10-CM

## 2023-05-11 DIAGNOSIS — E7800 Pure hypercholesterolemia, unspecified: Secondary | ICD-10-CM

## 2023-05-11 MED ORDER — VITAMIN D (ERGOCALCIFEROL) 1.25 MG (50000 UNIT) PO CAPS: 50000.0000 [IU] | ORAL_CAPSULE | ORAL | 0 refills | Status: DC

## 2023-05-11 NOTE — Progress Notes (Addendum)
 SUBJECTIVE: Discussed the use of AI scribe software for clinical note transcription with the patient, who gave verbal consent to proceed.  Chief Complaint: Obesity  Interim History: She is down 3 lbs since her last visit.  Down 17 lbs overall TBW loss of 8.12%  [name] is here to discuss her progress with her obesity treatment plan. She is on the Category 1 Plan and states she is following her eating plan approximately 50 % of the time. She states she is exercising Aerobics classes 40 minutes 5 times per week.  Margaret Reese is a 78 year old female who presents for follow-up of her obesity treatment plan.  She has lost three pounds since her last visit and a total of seventeen pounds overall.  She follows a category one plan approximately fifty percent of the time and exercises regularly, attending aerobics classes twice a week and exercising at home three times a week for forty minutes each session, totaling five days of exercise weekly.   She is currently taking ergocalciferol 50,000 units every seven days, Benicar 20 mg  and Cardizem CD 120 mg daily for hypertension, and Zocor 20 mg daily for hypercholesterolemia.   Menu planning each morning is working well for her, although she sometimes struggles to follow it completely. She has increased her protein intake and reduced 'the bad stuff', but notes that a recent recommendation from a psychologist did not work as well for her.  She experiences stress related to her husband's health and caregiving responsibilities. Her husband has been experiencing dizziness for months and recently wore a heart monitor, which showed insignificant arrhythmia. He sometimes takes his morning pills at night, which includes heart medication, leading to a scare that required consultation with poison control. This has contributed to stress eating at times.   She uses a CPAP machine for sleep apnea but feels she is not getting quality sleep, waking up frequently  and not feeling rested. She uses the nasal CPAP with a humidifier but still wakes up at night. OBJECTIVE: Visit Diagnoses: Problem List Items Addressed This Visit     Essential hypertension - Primary   Hyperlipidemia   Vitamin D deficiency   Relevant Medications   Vitamin D, Ergocalciferol, (DRISDOL) 1.25 MG (50000 UNIT) CAPS capsule   Obesity (HCC)- Start BMI 40.06   Stress   Other Visit Diagnoses       BMI 37.0-37.9, adult Current BMI 37.0         Obesity She has lost a total of 17 pounds, including 3 pounds since her last visit. She adheres to a category one plan approximately 50% of the time and exercises aerobically five days a week.  She focuses on protein intake and menu planning but struggles with emotional eating and stress related to her husband's health issues.  Emphasized the importance of protein intake to control hunger and advised minimizing indulgence during family visits. - Continue current exercise regimen - Focus on protein intake - Minimize indulgence during family visits - Continue category one plan   Stress with emotional eating. Her husband's dementia diagnosis and recent evaluation for his falls/dizziness are sources of stress impacting her emotional eating habits.  Hypertension Hypertension is managed with Benicar 20 mg daily and Cardizem CD 120 mg daily. No side effects reported.  BP Readings from Last 3 Encounters:  05/11/23 (!) 145/82  03/28/23 (!) 143/73  02/07/23 137/72   Lab Results  Component Value Date   NA 142 03/28/2023   CL 103 03/28/2023  K 4.3 03/28/2023   CO2 26 03/28/2023   BUN 21 03/28/2023   CREATININE 0.87 03/28/2023   EGFR 68 03/28/2023   CALCIUM 10.4 (H) 03/28/2023   ALBUMIN 4.5 03/28/2023   GLUCOSE 77 03/28/2023  - Continue Benicar 20 mg daily and Cardizem CD 120 mg daily. Continue to work on nutrition plan to promote weight loss and improve BP control.    Hypercholesterolemia Hypercholesterolemia is managed with  Zocor 20 mg daily. Her cholesterol levels are well controlled with LDL at 63, triglycerides at 69, and HDL at 60. Exercise will help maintain these levels. Lab Results  Component Value Date   CHOL 137 03/28/2023   CHOL 141 09/08/2022   CHOL 167 12/29/2021   Lab Results  Component Value Date   HDL 60 03/28/2023   HDL 68 09/08/2022   HDL 84 12/29/2021   Lab Results  Component Value Date   LDLCALC 63 03/28/2023   LDLCALC 59 09/08/2022   LDLCALC 72 12/29/2021   Lab Results  Component Value Date   TRIG 69 03/28/2023   TRIG 72 09/08/2022   TRIG 55 12/29/2021   Lab Results  Component Value Date   CHOLHDL 2.2 06/22/2021   CHOLHDL 2.6 06/01/2020   CHOLHDL 2.9 11/20/2018   No results found for: "LDLDIRECT" The 10-year ASCVD risk score (Arnett DK, et al., 2019) is: 35.4%   Values used to calculate the score:     Age: 23 years     Sex: Female     Is Non-Hispanic African American: No     Diabetic: No     Tobacco smoker: No     Systolic Blood Pressure: 145 mmHg     Is BP treated: Yes     HDL Cholesterol: 60 mg/dL     Total Cholesterol: 137 mg/dL  - Continue Zocor 20 mg daily Continue to work on nutrition plan -decreasing simple carbohydrates, increasing lean proteins, decreasing saturated fats and cholesterol , avoiding trans fats and exercise as able to promote weight loss, improve lipids and decrease cardiovascular risks.   Vitamin D Deficiency She is on ergocalciferol 50,000 units weekly for vitamin D deficiency. Her vitamin D levels are in the 30s, which is considered acceptable.  Last vitamin D Lab Results  Component Value Date   VD25OH 35.9 03/28/2023  Low vitamin D levels can be associated with adiposity and may result in leptin resistance and weight gain. Also associated with fatigue.  Currently on vitamin D supplementation without any adverse effects such as nausea, vomiting or muscle weakness.  - Continue ergocalciferol 50,000 units weekly - Refill vitamin D  prescription Meds ordered this encounter  Medications   Vitamin D, Ergocalciferol, (DRISDOL) 1.25 MG (50000 UNIT) CAPS capsule    Sig: Take 1 capsule (50,000 Units total) by mouth every 7 (seven) days.    Dispense:  12 capsule    Refill:  0    General Health Maintenance Her B12 levels have improved, likely due to better dietary adherence. Her A1c is 5.6, indicating good glycemic control. Insulin levels were slightly elevated at 7.9, likely due to recent dietary choices. Calcium levels remain very slightly elevated, and she is under observation for potential hyperparathyroidism. Emphasized the importance of weight-bearing exercise for bone health. - Continue weight-bearing exercise - Monitor calcium levels - Maintain current dietary plan  Follow-up She is scheduled for a follow-up visit in six weeks. She has an upcoming well visit in May, which will include a review of her calcium levels. She also  has a new patient appointment with pulmonary care for sleep apnea management for her CPAP therapy. - Schedule follow-up visit in six weeks - Attend well visit in May - Attend pulmonary care appointment for sleep apnea  Vitals Temp: (!) 97.4 F (36.3 C) BP: (!) 145/82 Pulse Rate: 97 SpO2: 95 %   Anthropometric Measurements Height: 5\' 1"  (1.549 m) Weight: 195 lb (88.5 kg) BMI (Calculated): 36.86 Weight at Last Visit: 198lb Weight Lost Since Last Visit: 3lb Weight Gained Since Last Visit: 0 Starting Weight: 212lb Total Weight Loss (lbs): 17 lb (7.711 kg) Peak Weight: 307lb   Body Composition  Body Fat %: 49.6 % Fat Mass (lbs): 97 lbs Muscle Mass (lbs): 93.6 lbs Total Body Water (lbs): 75.6 lbs Visceral Fat Rating : 17   Other Clinical Data Fasting: yes Labs: no Today's Visit #: 45 Starting Date: 03/26/19     ASSESSMENT AND PLAN:  Diet: Margaret Reese is currently in the action stage of change. As such, her goal is to continue with weight loss efforts. She has agreed to  Category 1 Plan.  Exercise: Jonelle has been instructed to continue exercising as is for weight loss and overall health benefits.   Behavior Modification:  We discussed the following Behavioral Modification Strategies today: increasing lean protein intake, decreasing simple carbohydrates, increasing vegetables, increase H2O intake, increase high fiber foods, meal planning and cooking strategies, better snacking choices, emotional eating strategies , avoiding temptations, and planning for success. We discussed various medication options to help Devita with her weight loss efforts and we both agreed to continue to work on nutritional and behavioral strategies to promote weight loss.  .  Return in about 5 weeks (around 06/15/2023).Marland Kitchen She was informed of the importance of frequent follow up visits to maximize her success with intensive lifestyle modifications for her multiple health conditions.  Attestation Statements:   Reviewed by clinician on day of visit: allergies, medications, problem list, medical history, surgical history, family history, social history, and previous encounter notes.   Time spent on visit including pre-visit chart review and post-visit care and charting was 41 minutes.    Cruz Bong, PA-C

## 2023-05-24 NOTE — Addendum Note (Signed)
 Addended by: Elease Etienne A on: 05/24/2023 01:24 PM   Modules accepted: Orders

## 2023-05-24 NOTE — Progress Notes (Signed)
 Remote pacemaker transmission.

## 2023-05-30 ENCOUNTER — Other Ambulatory Visit: Payer: Self-pay | Admitting: Adult Health

## 2023-05-30 DIAGNOSIS — Z1231 Encounter for screening mammogram for malignant neoplasm of breast: Secondary | ICD-10-CM | POA: Diagnosis not present

## 2023-05-30 LAB — HM MAMMOGRAPHY

## 2023-06-01 ENCOUNTER — Ambulatory Visit (INDEPENDENT_AMBULATORY_CARE_PROVIDER_SITE_OTHER): Admitting: Family Medicine

## 2023-06-05 ENCOUNTER — Telehealth (INDEPENDENT_AMBULATORY_CARE_PROVIDER_SITE_OTHER): Admitting: Psychology

## 2023-06-05 DIAGNOSIS — F32A Depression, unspecified: Secondary | ICD-10-CM

## 2023-06-05 DIAGNOSIS — F419 Anxiety disorder, unspecified: Secondary | ICD-10-CM

## 2023-06-05 DIAGNOSIS — F5089 Other specified eating disorder: Secondary | ICD-10-CM

## 2023-06-05 NOTE — Progress Notes (Signed)
  Office: [phone]  /  Fax: [phone]    Date: June 05, 2023  Appointment Start Time: 4:01pm Duration: 39 minutes Provider: Catherene Close, Psy.D. Type of Session: Individual Therapy  Location of Patient: Home (private location) Location of Provider: Provider's Home (private office) Type of Contact: Telepsychological Visit via MyChart Video Visit  Session Content: Margaret Reese is a 78 y.o. female presenting for a follow-up appointment to address the previously established treatment goal of increasing coping skills.Today's appointment was a telepsychological visit. Margaret Reese provided verbal consent for today's telepsychological appointment and she is aware she is responsible for securing confidentiality on her end of the session. Prior to proceeding with today's appointment, Margaret Reese's physical location at the time of this appointment was obtained as well a phone number she could be reached at in the event of technical difficulties. Margaret Reese and this provider participated in today's telepsychological service.   This provider conducted a brief check-in. Margaret Reese stated she celebrated her 58th wedding anniversary. She disclosed eating is "not going well at all." Margaret Reese explained she continues to "experience a lot, a lot of stress." Further explored and processed. Recommended traditional therapeutic services. Margaret Reese expressed ambivalence and noted she is "very overwhelmed." Associated thoughts and feelings were processed further. Overall, Margaret Reese was receptive to today's appointment as evidenced by openness to sharing and responsiveness to feedback.  Mental Status Examination:  Appearance: neat Behavior: appropriate to circumstances Mood: sad Affect: mood congruent; tearful at times Speech: WNL Eye Contact: appropriate Psychomotor Activity: WNL Gait: unable to assess Thought Process: linear, logical, and goal directed and denies suicidal, homicidal, and self-harm ideation, plan and intent  Thought  Content/Perception: no hallucinations, delusions, bizarre thinking or behavior endorsed or observed Orientation: AAOx4 Memory/Concentration: intact Insight: fair Judgment: fair  Interventions:  Conducted a brief chart review Provided empathic reflections and validation Provided positive reinforcement Employed supportive psychotherapy interventions to facilitate reduced distress and to improve coping skills with identified stressors Recommended/discussed options for longer-term therapeutic services  DSM-5 Diagnosis(es):  F50.89 Other Specified Feeding or Eating Disorder, Emotional Eating Behaviors, F41.9 Unspecified Anxiety Disorder, and  F32.A Unspecified Depressive Disorder  Treatment Goal & Progress: During the initial appointment with this provider, the following treatment goal was established: increase coping skills. [name] has demonstrated progress in her goal as evidenced by increased awareness of hunger patterns. [name] also continues to demonstrate willingness to engage in learned skill(s).   Plan: Based on today's appointment, the next appointment is scheduled for 08/01/2023 at 8:30am per Margaret Reese's request, which will be via MyChart Video Visit. The next session will focus on working towards the established treatment goal.   Catherene Close, PsyD

## 2023-06-10 DIAGNOSIS — K5900 Constipation, unspecified: Secondary | ICD-10-CM | POA: Diagnosis not present

## 2023-06-10 DIAGNOSIS — K209 Esophagitis, unspecified without bleeding: Secondary | ICD-10-CM | POA: Diagnosis not present

## 2023-06-10 DIAGNOSIS — R1111 Vomiting without nausea: Secondary | ICD-10-CM | POA: Diagnosis not present

## 2023-06-10 DIAGNOSIS — R1013 Epigastric pain: Secondary | ICD-10-CM | POA: Diagnosis not present

## 2023-06-13 ENCOUNTER — Ambulatory Visit (INDEPENDENT_AMBULATORY_CARE_PROVIDER_SITE_OTHER): Admitting: Physician Assistant

## 2023-06-13 ENCOUNTER — Encounter (INDEPENDENT_AMBULATORY_CARE_PROVIDER_SITE_OTHER): Payer: Self-pay | Admitting: Physician Assistant

## 2023-06-13 VITALS — BP 109/67 | HR 76 | Temp 97.4°F | Ht 61.0 in | Wt 193.0 lb

## 2023-06-13 DIAGNOSIS — I10 Essential (primary) hypertension: Secondary | ICD-10-CM | POA: Diagnosis not present

## 2023-06-13 DIAGNOSIS — F5089 Other specified eating disorder: Secondary | ICD-10-CM

## 2023-06-13 DIAGNOSIS — R1310 Dysphagia, unspecified: Secondary | ICD-10-CM | POA: Diagnosis not present

## 2023-06-13 DIAGNOSIS — E559 Vitamin D deficiency, unspecified: Secondary | ICD-10-CM | POA: Diagnosis not present

## 2023-06-13 DIAGNOSIS — E669 Obesity, unspecified: Secondary | ICD-10-CM

## 2023-06-13 DIAGNOSIS — Z6836 Body mass index (BMI) 36.0-36.9, adult: Secondary | ICD-10-CM

## 2023-06-13 DIAGNOSIS — F439 Reaction to severe stress, unspecified: Secondary | ICD-10-CM

## 2023-06-13 DIAGNOSIS — K219 Gastro-esophageal reflux disease without esophagitis: Secondary | ICD-10-CM

## 2023-06-13 MED ORDER — VITAMIN D (ERGOCALCIFEROL) 1.25 MG (50000 UNIT) PO CAPS: 50000.0000 [IU] | ORAL_CAPSULE | ORAL | 0 refills | Status: DC

## 2023-06-13 NOTE — Progress Notes (Signed)
 SUBJECTIVE: Discussed the use of AI scribe software for clinical note transcription with the patient, who gave verbal consent to proceed.  Chief Complaint: Obesity  Interim History: She is down 2 pounds since her last visit Down 19 lbs overall TBW loss ~9% Margaret Reese is here to discuss her progress with her obesity treatment plan. She is on the Category 1 Plan and states she is following her eating plan approximately 50 % of the time. She states she is not exercising.  Yanely Daneen Dunk is a 78 year old female who presents for follow-up of her obesity treatment plan.  She experienced a recent episode of dysphagia after swallowing a piece of meat, which led to pain radiating from her ear to her stomach, vomiting, and difficulty swallowing, including an inability to swallow water and her blood pressure pills. An x-ray at urgent care showed no blockages. She has since been able to eat but remains cautious.  She has a history of hypertension, vitamin D  deficiency, and stress-related emotional eating. Her current medications include blood pressure pills,Benicar  and diltiazem  which she takes regularly. She has lost two pounds since her last visit, totaling a weight loss of nineteen pounds. She is currently consuming more high-protein foods such as scrambled eggs, cottage cheese, and spinach quiche to maintain her nutrition.  She mentions a large hernia and experiences abdominal pain that sometimes radiates to her back, which she attributes to recent physical activity, including cleaning. She also reports leg swelling, which she does not usually experience, and attributes it to overexertion.  She has a history of emotional eating related to stress and has been managing her diet by incorporating more protein-rich foods. She has not used protein shakes in the past but is considering them to maintain nutrition.   Her husband has a history of esophageal dilatation, but she has not undergone this procedure. She  has not noticed any prior swallowing difficulties and typically takes multiple pills at night without issue. She reports a history of constipation, which has improved with medication from a previous visit.  She describes a recent family gathering for Easter and a birthday celebration, which involved significant food preparation and cleaning, contributing to her stress and physical exertion. She plans to take a break and travel to [employer] [place] [employer] for rest. OBJECTIVE: Visit Diagnoses: Problem List Items Addressed This Visit     Essential hypertension   GERD (gastroesophageal reflux disease)   Vitamin D  deficiency   Relevant Medications   Vitamin D , Ergocalciferol , (DRISDOL ) 1.25 MG (50000 UNIT) CAPS capsule   Obesity (HCC)- Start BMI 40.06   Stress   Other Visit Diagnoses       Dysphagia, unspecified type    -  Primary     BMI 36.0-36.9,adult Current BMI 36.5          Obesity Ongoing obesity management with a weight loss of 2 pounds since last visit, totaling 19 pounds lost. Current weight is 193 pounds with a goal of 177 pounds. Dietary adjustments include high protein intake with foods like scrambled eggs, cottage cheese, and quiche. Considering protein shakes to supplement nutrition. - Continue current dietary plan focusing on high protein intake - Incorporate protein shakes for additional nutrition if having difficulty with oral intake.  - Encourage continued weight loss efforts towards goal weight of 177 pounds  Dysphagia Recent episode of dysphagia with difficulty swallowing, particularly with meat, accompanied by vomiting and inability to swallow water or pills. Symptoms have improved, but there is concern for esophageal dysfunction  or other underlying issues. No prior esophageal issues or need for dilatation. Protein shakes suggested for nutritional backup if swallowing difficulties recur. - Contact primary care physician, Dr. Robina Chol, to discuss recent dysphagia episode and consider  earlier appointment - Refer to gastroenterologist for evaluation of potential esophageal dysfunction or other issues - Use protein shakes as a backup plan for nutrition if swallowing difficulties recur  Gastroesophageal reflux disease (GERD) GERD symptoms, including burping and possible reflux, may contribute to dysphagia and esophageal discomfort. No blockages on recent abdominal x-ray per patient at Urgent Care and cannot see work up in system .   Hypertension Hypertension is well-controlled with current medication regimen. Patient reports Blood pressure was elevated at 171 mmHg on Saturday, likely due to anxiety, but has since decreased to 101 mmHg at home earlier today.  BP Readings from Last 3 Encounters:  06/13/23 109/67  05/11/23 (!) 145/82  03/28/23 (!) 143/73   Continue to work on nutrition plan to promote weight loss and improve BP control.  - Continue current antihypertensive medication regimen   Vitamin D  Deficiency Vitamin D  is not at goal of 50.  Most recent vitamin D  level was 35.9. She is on  prescription ergocalciferol  50,000 IU weekly. No N/V or muscle weakness with Ergo.  Lab Results  Component Value Date   VD25OH 35.9 03/28/2023   VD25OH 39.5 09/08/2022   VD25OH 27.5 (L) 12/29/2021    Plan: Continue and refill  prescription ergocalciferol  50,000 IU weekly Low vitamin D  levels can be associated with adiposity and may result in leptin resistance and weight gain. Also associated with fatigue.  Currently on vitamin D  supplementation without any adverse effects such as nausea, vomiting or muscle weakness.    Eating disorder/emotional eating Janera has had issues with stress/emotional eating. Currently this is moderately controlled. Overall mood is stable. Medication(s): None She plans on going to mountains where she feels relaxed and more rested following a very busy schedule with multiple family visiting as well as events like her birthday and [employer] which  increased her responsibilities. Her husband has also been having increased difficulties with memory and this has increased stressors.  Plan:  Continue to work on emotional eating strategies along with Dr. Delaine Favorite.  Discussed continuing to work on stress reduction strategies - like taking trips to the mountains.    Vitals Temp: (!) 97.4 F (36.3 C) BP: 109/67 Pulse Rate: 76 SpO2: 94 %   Anthropometric Measurements Height: 5\' 1"  (1.549 m) Weight: 193 lb (87.5 kg) BMI (Calculated): 36.49 Weight at Last Visit: 195 lb Weight Lost Since Last Visit: 2 lb Weight Gained Since Last Visit: 0 Starting Weight: 212 lb Total Weight Loss (lbs): 19 lb (8.618 kg) Peak Weight: 307 lb   Body Composition  Body Fat %: 49.2 % Fat Mass (lbs): 95 lbs Muscle Mass (lbs): 93.2 lbs Total Body Water (lbs): 74 lbs Visceral Fat Rating : 16   Other Clinical Data Fasting: yes Labs: no Today's Visit #: 46 Starting Date: 03/26/19     ASSESSMENT AND PLAN:  Diet: Aaron is currently in the action stage of change. As such, her goal is to continue with weight loss efforts. She has agreed to Category 1 Plan.  Exercise: Chamari has been instructed to continue exercising as is and continue AHOY program  for weight loss and overall health benefits.   Behavior Modification:  We discussed the following Behavioral Modification Strategies today: increasing lean protein intake, decreasing simple carbohydrates, increasing vegetables, increase H2O intake, no  skipping meals, meal planning and cooking strategies, avoiding temptations, planning for success, and consider protein shakes for protein supplementation if having any further difficulty with swallowing . We discussed various medication options to help Minha with her weight loss efforts and we both agreed to continue to work on nutritional and behavioral strategies to promote weight loss.  .  Return in about 6 weeks (around 07/25/2023).Aaron Aas She was informed of the  importance of frequent follow up visits to maximize her success with intensive lifestyle modifications for her multiple health conditions.  Attestation Statements:   Reviewed by clinician on day of visit: allergies, medications, problem list, medical history, surgical history, family history, social history, and previous encounter notes.   Time spent on visit including pre-visit chart review and post-visit care and charting was 43 minutes.    Jatavia Keltner, PA-C

## 2023-06-17 ENCOUNTER — Other Ambulatory Visit: Payer: Self-pay | Admitting: Cardiology

## 2023-06-17 DIAGNOSIS — I10 Essential (primary) hypertension: Secondary | ICD-10-CM

## 2023-06-29 ENCOUNTER — Ambulatory Visit: Payer: Medicare Other | Admitting: Pulmonary Disease

## 2023-06-29 ENCOUNTER — Encounter: Payer: Self-pay | Admitting: Pulmonary Disease

## 2023-06-29 VITALS — BP 148/73 | HR 82 | Ht 61.0 in | Wt 202.4 lb

## 2023-06-29 DIAGNOSIS — G4733 Obstructive sleep apnea (adult) (pediatric): Secondary | ICD-10-CM

## 2023-06-29 MED ORDER — ALBUTEROL SULFATE HFA 108 (90 BASE) MCG/ACT IN AERS
1.0000 | INHALATION_SPRAY | Freq: Four times a day (QID) | RESPIRATORY_TRACT | 2 refills | Status: AC | PRN
Start: 2023-06-29 — End: ?

## 2023-06-29 NOTE — Progress Notes (Signed)
 Margaret Reese    [id number]    05-Feb-1946  Primary Care Physician:Lalonde, Theadora Fines, MD  Referring Physician: Watson Hacking, MD [address] [place],  [place] [address]  Chief complaint:   In for follow-up today  HPI:  Patient with a history of obstructive sleep apnea on CPAP therapy - Sometimes does take the mask off Sometimes when she wakes up in the middle of the night to use the bathroom, does not put the mask back on  She feels her mouth is dry on most days  Breathing has been relatively stable History of mild persistent asthma Has been on Breo  Has not been needing albuterol , feels albuterol  does make her jittery  Tries to use her CPAP on a nightly basis and does benefit from a CPAP Uses a nasal mask but feels her mouth hangs open sometimes   Outpatient Encounter Medications as of 06/29/2023  Medication Sig   albuterol  (VENTOLIN  HFA) 108 (90 Base) MCG/ACT inhaler Inhale 1-2 puffs into the lungs every 6 (six) hours as needed for wheezing or shortness of breath.   aspirin  81 MG tablet Take 81 mg by mouth daily.   BREO ELLIPTA  100-25 MCG/ACT AEPB INHALE 1 PUFF INTO THE LUNGS DAILY   diltiazem  (CARDIZEM  CD) 120 MG 24 hr capsule TAKE 1 CAPSULE BY MOUTH DAILY; HOLD IF BLOOD PRESSURE IS LESS THAN 100 MMHG   loratadine (CLARITIN) 10 MG tablet Take 10 mg by mouth daily as needed for allergies.   Multiple Vitamins-Minerals (PRESERVISION AREDS 2) CAPS Take 1 capsule by mouth 2 (two) times daily.    olmesartan  (BENICAR ) 20 MG tablet TAKE 1 TABLET BY MOUTH EVERY DAY   potassium citrate  (UROCIT-K ) 10 MEQ (1080 MG) SR tablet Take 10 mEq by mouth 2 (two) times daily.   simvastatin  (ZOCOR ) 20 MG tablet TAKE 1 TABLET(20 MG) BY MOUTH EVERY EVENING   Vitamin D , Ergocalciferol , (DRISDOL ) 1.25 MG (50000 UNIT) CAPS capsule Take 1 capsule (50,000 Units total) by mouth every 7 (seven) days.   No facility-administered encounter medications on file as of 06/29/2023.     Allergies as of 06/29/2023 - Review Complete 06/29/2023  Allergen Reaction Noted   Toprol  xl [metoprolol  tartrate]  05/01/2020    Past Medical History:  Diagnosis Date   Allergy    Anxiety    Arthritis    Asthma    Complication of anesthesia    pt states has difficulty awakening; also has increased sinus drainage   Constipation    Edema, lower extremity    Encounter for interrogation of cardiac pacemaker 09/05/2018   Falls    GERD (gastroesophageal reflux disease)    H/O back injury    History of bronchitis    History of colon polyps    Hypertension    Imbalance    Kidney cysts    pt states not sure which kidney does see kidney specialist yearly pt states every thing okay currently    Legally blind in right eye, as defined in [place]     Mobitz type II atrioventricular block 11/08/2016   Multiple gastric ulcers    Obesity    OSA (obstructive sleep apnea) 05/03/2022   Pacemaker S/P St Jude Medical Assurity MRI model [id number] 05/20/2016   Renal stone    Retinal vein occlusion    Shortness of breath    Sinus node dysfunction (HCC) 12/16/2018   Tingling    left arm    Trace cataracts  Urinary frequency    Vitamin D  deficiency     Past Surgical History:  Procedure Laterality Date   CARDIOVASCULAR STRESS TEST  dec 2016   CHOLECYSTECTOMY     COLONOSCOPY  2011   EYE SURGERY Bilateral    Cataract surgery.    LEFT HEART CATH AND CORONARY ANGIOGRAPHY N/A 05/19/2016   Procedure: Left Heart Cath and Coronary Angiography;  Surgeon: Pasqual Bone, MD;  Location: [facility];  Service: Cardiovascular;  Laterality: N/A;   PACEMAKER IMPLANT N/A 05/20/2016   Procedure: Pacemaker Implant;  Surgeon: Will Cortland Ding, MD;  Location: [facility];  Service: Cardiovascular;  Laterality: N/A;   TONSILLECTOMY     TOTAL HIP ARTHROPLASTY Right 01/30/2015   Procedure: RIGHT TOTAL HIP ARTHROPLASTY ANTERIOR APPROACH AND REMOVAL LIPOMA RIGHT HIP;  Surgeon: Arnie Lao, MD;   Location: [facility];  Service: Orthopedics;  Laterality: Right;   UPPER GI ENDOSCOPY      Family History  Problem Relation Age of Onset   Depression Mother    Stroke Mother    Kidney disease Mother    Bipolar disorder Mother    Liver disease Mother    Eating disorder Mother    Obesity Mother    Asthma Brother     Social History   Socioeconomic History   Marital status: Married    Spouse name: Leighton Punches "[place]"   Number of children: 2   Years of education: Not on file   Highest education level: Not on file  Occupational History   Occupation: retired Child psychotherapist  Tobacco Use   Smoking status: Never    Passive exposure: Never   Smokeless tobacco: Never  Vaping Use   Vaping status: Never Used  Substance and Sexual Activity   Alcohol use: No   Drug use: No   Sexual activity: Yes  Other Topics Concern   Not on file  Social History Narrative   Not on file   Social Drivers of Health   Financial Resource Strain: Not on file  Food Insecurity: Not on file  Transportation Needs: Not on file  Physical Activity: Not on file  Stress: Not on file  Social Connections: Not on file  Intimate Partner Violence: Not on file    Review of Systems  Constitutional:  Negative for fatigue.  Respiratory:  Positive for apnea and shortness of breath.   Psychiatric/Behavioral:  Positive for sleep disturbance.     Vitals:   06/29/23 1310  BP: (!) 148/73  Pulse: 82  SpO2: 94%     Physical Exam Constitutional:      Appearance: She is obese.  HENT:     Head: Normocephalic.     Mouth/Throat:     Mouth: Mucous membranes are moist.  Eyes:     General: No scleral icterus. Cardiovascular:     Rate and Rhythm: Normal rate and regular rhythm.     Heart sounds: No murmur heard.    No friction rub.  Pulmonary:     Effort: No respiratory distress.     Breath sounds: No stridor. No wheezing or rhonchi.  Musculoskeletal:     Cervical back: No rigidity or tenderness.   Neurological:     Mental Status: She is alert.  Psychiatric:        Mood and Affect: Mood normal.     Data Reviewed: CPAP download reviewed showing excellent compliance at 97% Average use of 7 hours 22 minutes AutoSet 5-15 Residual AHI of 0.8  Assessment:  Mild obstructive sleep apnea with AHI of 10.5 - Tolerating CPAP well  Encouraged to continue using CPAP  Did try a chinstrap that did not help - Discussed about the use of mouth tapes to be used in association with CPAP therapy  Plan/Recommendations: Trial with mouth tape  Continue current CPAP settings  Continue Breo  Will place a prescription for albuterol  to be used as needed  Follow-up in about 3 months  Encouraged to call with significant concerns     Myer Artis MD [facility] 06/29/2023, 1:20 PM  CC: Watson Hacking, MD

## 2023-06-29 NOTE — Patient Instructions (Signed)
 I will see you back in about 3 months  Continue using your CPAP  Prescription for albuterol  be sent to pharmacy for you - Its only to be used as needed  Continue with your Breo  Try a mouth tape, kinesiology tape that is cut to size will suffice  Call us  with significant concerns  The download from your CPAP machine shows it is working well

## 2023-07-11 ENCOUNTER — Ambulatory Visit (INDEPENDENT_AMBULATORY_CARE_PROVIDER_SITE_OTHER): Payer: Medicare Other | Admitting: Family Medicine

## 2023-07-11 ENCOUNTER — Encounter: Payer: Self-pay | Admitting: Family Medicine

## 2023-07-11 VITALS — BP 112/60 | HR 85 | Ht 60.0 in | Wt 196.4 lb

## 2023-07-11 DIAGNOSIS — Z950 Presence of cardiac pacemaker: Secondary | ICD-10-CM

## 2023-07-11 DIAGNOSIS — M1990 Unspecified osteoarthritis, unspecified site: Secondary | ICD-10-CM

## 2023-07-11 DIAGNOSIS — E782 Mixed hyperlipidemia: Secondary | ICD-10-CM | POA: Diagnosis not present

## 2023-07-11 DIAGNOSIS — I10 Essential (primary) hypertension: Secondary | ICD-10-CM | POA: Diagnosis not present

## 2023-07-11 DIAGNOSIS — Z0000 Encounter for general adult medical examination without abnormal findings: Secondary | ICD-10-CM | POA: Diagnosis not present

## 2023-07-11 DIAGNOSIS — Z6379 Other stressful life events affecting family and household: Secondary | ICD-10-CM

## 2023-07-11 DIAGNOSIS — I5032 Chronic diastolic (congestive) heart failure: Secondary | ICD-10-CM | POA: Diagnosis not present

## 2023-07-11 DIAGNOSIS — G4733 Obstructive sleep apnea (adult) (pediatric): Secondary | ICD-10-CM | POA: Diagnosis not present

## 2023-07-11 DIAGNOSIS — I272 Pulmonary hypertension, unspecified: Secondary | ICD-10-CM

## 2023-07-11 DIAGNOSIS — I442 Atrioventricular block, complete: Secondary | ICD-10-CM

## 2023-07-11 DIAGNOSIS — Z96641 Presence of right artificial hip joint: Secondary | ICD-10-CM | POA: Diagnosis not present

## 2023-07-11 DIAGNOSIS — E785 Hyperlipidemia, unspecified: Secondary | ICD-10-CM

## 2023-07-11 DIAGNOSIS — J301 Allergic rhinitis due to pollen: Secondary | ICD-10-CM

## 2023-07-11 DIAGNOSIS — K219 Gastro-esophageal reflux disease without esophagitis: Secondary | ICD-10-CM

## 2023-07-11 MED ORDER — SIMVASTATIN 20 MG PO TABS
20.0000 mg | ORAL_TABLET | Freq: Every day | ORAL | 3 refills | Status: AC
Start: 2023-07-11 — End: ?

## 2023-07-11 NOTE — Progress Notes (Signed)
 Tenisha G Meth is a 78 y.o. female who presents for annual wellness visit,CPE and follow-up on chronic medical conditions.  She states that her husband was recently diagnosed with Alzheimer's and she is now starting to have to help take care of him as well.  She follows up regularly with cardiology as well as pulmonary.  Apparently there is a concern about her not really necessarily needing Breo.  She does have OSA and states the CPAP is working quite well.  She is not having any difficulty with reflux type symptoms.  She is starting to have some difficulty with her his food and does plan to see orthopedics for follow-up on that.  She is not having any difficulty with back pain.  Her weight is relatively stable.  Immunizations and Health Maintenance Immunization History  Administered Date(s) Administered   Fluad Trivalent(High Dose 65+) 11/03/2022   Influenza Split 11/07/2011, 12/01/2012   Influenza Whole 01/11/2006, 11/28/2008, 11/30/2010   Influenza-Unspecified 12/08/2014, 12/07/2015, 11/21/2016, 11/21/2017, 11/16/2018, 11/03/2020, 01/15/2022   PFIZER(Purple Top)SARS-COV-2 Vaccination 04/07/2019, 04/30/2019, 10/24/2019   Pfizer Covid-19 Vaccine Bivalent Booster 23yrs & up 12/25/2020   Pfizer(Comirnaty)Fall Seasonal Vaccine 12 years and older 11/03/2022   Pneumococcal Conjugate-13 09/15/2015   Pneumococcal Polysaccharide-23 09/07/2004, 09/11/2012   Td 09/17/2004   Tdap 06/01/2015   Zoster, Live 12/16/2009   Health Maintenance Due  Topic Date Due   Zoster Vaccines- Shingrix (1 of 2) 06/15/1994   Medicare Annual Wellness (AWV)  06/23/2022    Last Pap smear:2010 Last mammogram:05/30/2023 Last colonoscopy:10/17/2014 Last DEXA:03/18/2014 Dentist:next Tues (every 6 months) Ophtho: saw recently, generally every 2 years Exercise:2 days weekly 40 min class-then repeats at home daily  Other doctors caring for patient include: GI:Dr.Hung Dentist: Dr. Otho Blitz MWM: Adolm Ahumada Rayburn PA Optho: Candi Chafe  Eye Care Cards: Albert Huff Pulm: Dr. Gaynell Keeler Urology: Dr. Nolon Baxter Ortho:Dr. Lucienne Ryder    Advanced directives:yes    Depression screen:  See questionnaire below.     06/28/2022    8:19 AM 06/22/2021    8:26 AM 06/22/2021    8:22 AM 06/01/2020    2:34 PM 08/21/2019   10:59 AM  Depression screen PHQ 2/9  Decreased Interest 0 0 0 0 0  Down, Depressed, Hopeless 0 0 0 0 1  PHQ - 2 Score 0 0 0 0 1  Altered sleeping 1 0     Tired, decreased energy 1 1     Change in appetite 1 3     Feeling bad or failure about yourself  0 0     Trouble concentrating 0 0     Moving slowly or fidgety/restless 1 0     Suicidal thoughts 0 0     PHQ-9 Score 4 4     Difficult doing work/chores Not difficult at all Not difficult at all       Fall Risk Screen: see questionnaire below.    06/29/2023    1:10 PM 01/03/2023    9:13 AM 06/28/2022    8:20 AM 06/22/2021    8:21 AM 06/01/2020    2:33 PM  Fall Risk   Falls in the past year? 0 0 0 0 0  Number falls in past yr:   0 0 0  Injury with Fall?   0 0 0  Risk for fall due to : Impaired mobility;Impaired balance/gait No Fall Risks No Fall Risks No Fall Risks No Fall Risks  Follow up   Falls evaluation completed Falls evaluation completed Falls evaluation completed    ADL  screen:  See questionnaire below Functional Status Survey:     Review of Systems Constitutional: -, -unexpected weight change, -anorexia, -fatigue Allergy: -sneezing, -itching, -congestion Dermatology: denies changing moles, rash, lumps ENT: -runny nose, -ear pain, -sore throat,  Cardiology:  -chest pain, -palpitations, -orthopnea, Respiratory: -cough, -shortness of breath, -dyspnea on exertion, -wheezing,  Gastroenterology: -abdominal pain, -nausea, -vomiting, -diarrhea, -constipation, -dysphagia Hematology: -bleeding or bruising problems Musculoskeletal: -arthralgias, -myalgias, -joint swelling, -back pain, - Ophthalmology: -vision changes,  Urology: -dysuria, -difficulty urinating,   -urinary frequency, -urgency, incontinence Neurology: -, -numbness, , -memory loss, -falls, -dizziness    PHYSICAL EXAM:   General Appearance: Alert, cooperative, no distress, appears stated age Head: Normocephalic, without obvious abnormality, atraumatic Eyes: PERRL, conjunctiva/corneas clear, EOM's intact,  Ears: Normal TM's and external ear canals Nose: Nares normal, mucosa normal, no drainage or sinus tenderness Throat: Lips, mucosa, and tongue normal; teeth and gums normal Neck: Supple, no lymphadenopathy;  thyroid :  no enlargement/tenderness/nodules; no carotid bruit or JVD Lungs: Clear to auscultation bilaterally without wheezes, rales or ronchi; respirations unlabored Heart: Regular rate and rhythm, S1 and S2 normal, no murmur, rubor gallop Skin:  Skin color, texture, turgor normal, no rashes or lesions Lymph nodes: Cervical, supraclavicular, and axillary nodes normal Neurologic:  CNII-XII intact, normal strength, sensation and gait; reflexes 2+ and symmetric throughout Psych: Normal mood, affect, hygiene and grooming.  ASSESSMENT/PLAN: Pacemaker S/P St Jude Medical Assurity MRI model [id number]  Status post total replacement of right hip  Pulmonary hypertension (HCC)  OSA (obstructive sleep apnea)  Obesity (HCC)- Start BMI 40.06  Mixed hyperlipidemia  Heart block AV complete (HCC)  Gastroesophageal reflux disease without esophagitis  Essential hypertension  Arthritis  Non-seasonal allergic rhinitis due to pollen  Chronic diastolic (congestive) heart failure (HCC)  Stress due to illness of family member  Hyperlipidemia, unspecified hyperlipidemia type - Plan: simvastatin  (ZOCOR ) 20 MG tablet She will continue to be followed by cardiology as well as pulmonary.   Discussed Alzheimer's disease with her and potentially getting involved in support groups to help manage the present situation.  Immunization recommendations discussed.   Medicare Attestation I have  personally reviewed: The patient's medical and social history Their use of alcohol, tobacco or illicit drugs Their current medications and supplements The patient's functional ability including ADLs,fall risks, home safety risks, cognitive, and hearing and visual impairment Diet and physical activities Evidence for depression or mood disorders  The patient's weight, height, and BMI have been recorded in the chart.  I have made referrals, counseling, and provided education to the patient based on review of the above and I have provided the patient with a written personalized care plan for preventive services.     Ron Cobbs, MD   07/11/2023

## 2023-07-18 ENCOUNTER — Ambulatory Visit (INDEPENDENT_AMBULATORY_CARE_PROVIDER_SITE_OTHER): Payer: Medicare Other

## 2023-07-18 DIAGNOSIS — I441 Atrioventricular block, second degree: Secondary | ICD-10-CM

## 2023-07-19 LAB — CUP PACEART REMOTE DEVICE CHECK
Battery Remaining Longevity: 28 mo
Battery Remaining Percentage: 28 %
Battery Voltage: 2.93 V
Brady Statistic AP VP Percent: 41 %
Brady Statistic AP VS Percent: 1 %
Brady Statistic AS VP Percent: 59 %
Brady Statistic AS VS Percent: 1 %
Brady Statistic RA Percent Paced: 41 %
Brady Statistic RV Percent Paced: 99 %
Date Time Interrogation Session: 20250527020013
Implantable Lead Connection Status: 753985
Implantable Lead Connection Status: 753985
Implantable Lead Implant Date: 20180330
Implantable Lead Implant Date: 20180330
Implantable Lead Location: 753859
Implantable Lead Location: 753860
Implantable Pulse Generator Implant Date: 20180330
Lead Channel Impedance Value: 390 Ohm
Lead Channel Impedance Value: 460 Ohm
Lead Channel Pacing Threshold Amplitude: 0.75 V
Lead Channel Pacing Threshold Amplitude: 0.75 V
Lead Channel Pacing Threshold Pulse Width: 0.5 ms
Lead Channel Pacing Threshold Pulse Width: 0.5 ms
Lead Channel Sensing Intrinsic Amplitude: 10.3 mV
Lead Channel Sensing Intrinsic Amplitude: 4.2 mV
Lead Channel Setting Pacing Amplitude: 2 V
Lead Channel Setting Pacing Amplitude: 2.5 V
Lead Channel Setting Pacing Pulse Width: 0.5 ms
Lead Channel Setting Sensing Sensitivity: 4 mV
Pulse Gen Model: 2272
Pulse Gen Serial Number: 7998534

## 2023-07-23 ENCOUNTER — Ambulatory Visit: Payer: Self-pay | Admitting: Cardiology

## 2023-07-24 NOTE — Progress Notes (Signed)
 SUBJECTIVE: Discussed the use of AI scribe software for clinical note transcription with the patient, who gave verbal consent to proceed.  Chief Complaint: Obesity  Interim History: She is up 2 lbs since her last visit.  Down 17 lbs overall TBW loss of 8.0 %  Margaret Reese is here to discuss her progress with her obesity treatment plan. She is on the Category 1 Plan and states she is following her eating plan approximately 35 % of the time. She states she is exercising 40 minutes 5 times per week. She is currently doing low impact aerobics for exercise. Margaret Reese is a 78 year old female who presents for follow-up on her obesity treatment plan.  She has experienced a weight gain of two pounds since her last visit in April, with a decrease in muscle mass and an increase in adipose tissue by 2.2 pounds. She attributes this weight gain to stress and emotional eating, particularly in the afternoons, and notes a craving for sugar. She adheres to her dietary plan about 35% of the time, with breakfast being the most consistent meal and reports she starts to get off track in the afternoon after lunchtime.  She has a history of obstructive sleep apnea, mixed hyperlipidemia, hypertension, and vitamin D  deficiency. She experiences swelling in her legs, primarily in the Right thigh that underwent surgery- THR , which is unusual for her. The swelling has increased recently, accompanied by significant pain, limiting her ability to exercise. The swelling has been isolated to the hip and thigh area and stops at the knee. She has not followed up with orthopedics.   Her current medications include CPAP for sleep apnea. She has been advised to stop vitamin D  supplementation by her primary care physician. She also has a supply of Breo, which she plans to finish despite being advised to discontinue it.    OBJECTIVE: Visit Diagnoses: Problem List Items Addressed This Visit     Essential hypertension   Obesity  (HCC)- Start BMI 40.06   Stress   Relevant Medications   buPROPion ER (WELLBUTRIN SR) 100 MG 12 hr tablet   Other Visit Diagnoses       Right hip pain    -  Primary     Vitamin D  deficiency         BMI 36.0-36.9,adult Current BMI 36.8         Hip Pain and Swelling/ S/P THR Swelling primarily in the leg above the knee recently and increased pain with weight bearing. Hx of previous R THR. Swelling is more pronounced in the thigh, causing significant pain and limiting exercise. - Refer to Dr. Lucienne Ryder for evaluation of hip for follow up and provided office number to patient who will call to schedule.  Obesity Weight increased by two pounds since April, with decreased muscle mass and increased adipose mass by 2.2 pounds. Reports stress and emotional eating, particularly sugar consumption in the afternoon. Discussed Wellbutrin (bupropion) for cravings and stress management, noting potential productive energy and reduced stress, but risk of increased anxiety. Wellbutrin can be stopped without tapering if not tolerated.Patient denies any history of glaucoma, no hx of seizures. Has HTN, but generally well controlled .  - Start Wellbutrin at 100 mg daily in the morning with breakfast. - Advise to eliminate sugar from the home and focus on protein snacks. - Encourage follow-up if experiencing negative effects from Wellbutrin.   Hypertension Hypertension asymptomatic, reasonably well controlled, and no significant medication side effects noted.  Medication(s):  diltiazem  CD 120 mg daily   Olmesartan  20 mg daily.  Normal renal.   BP Readings from Last 3 Encounters:  07/25/23 139/79  07/11/23 112/60  06/29/23 (!) 148/73   Lab Results  Component Value Date   CREATININE 0.87 03/28/2023   CREATININE 0.73 09/08/2022   CREATININE 0.86 12/29/2021   No results found for: "GFR"  Plan: Continue all antihypertensives at current dosages. Continue to work on nutrition plan to promote weight loss  and improve BP control.  Monitor Bp as starting low dose bupropion.    Vitamin D  Deficiency Vitamin D  level previously 35, slightly below desired range. PCP recommended discontinuing vitamin D  supplementation due to elevated calcium levels. Last vitamin D  Lab Results  Component Value Date   VD25OH 35.9 03/28/2023   Lab Results  Component Value Date   NA 142 03/28/2023   CL 103 03/28/2023   K 4.3 03/28/2023   CO2 26 03/28/2023   BUN 21 03/28/2023   CREATININE 0.87 03/28/2023   EGFR 68 03/28/2023   CALCIUM 10.4 (H) 03/28/2023   ALBUMIN 4.5 03/28/2023   GLUCOSE 77 03/28/2023    - Discontinue vitamin D  supplementation as per PCP's recommendation. Low vitamin D  levels can be associated with adiposity and may result in leptin resistance and weight gain. Also associated with fatigue.  Currently on vitamin D  supplementation without any adverse effects such as nausea, vomiting or muscle weakness.  Will plan to recheck both vitamin D  level and calcium levels over the next 1-2 visits.    Eating disorder/emotional eating/ caregiver stress Margaret Reese has had issues with stress/emotional eating. Currently this is poorly controlled. Overall mood is stable. Medication(s): None Discussed starting low dose bupropion to help manage cravings as well as stress.   Plan: Start Bupropion SR 100 mg daily in am She continues to work with Dr. Delaine Favorite to address emotional eating behaviors.   General Health Maintenance Discussed husband's health and need for coping strategies for her behavioral issues. Suggested exploring community resources and neurology group recommendations for managing behavioral issues. - Contact neurology group for recommendations on coping strategies and resources for managing husband's behavioral issues.  Vitals Temp: 97.8 F (36.6 C) BP: 139/79 Pulse Rate: 89 SpO2: 96 %   Anthropometric Measurements Height: 5\' 1"  (1.549 m) Weight: 195 lb (88.5 kg) BMI (Calculated):  36.86 Weight at Last Visit: 193 lb Weight Lost Since Last Visit: 0 Weight Gained Since Last Visit: 2 lb Starting Weight: 212 lb Total Weight Loss (lbs): 17 lb (7.711 kg) Peak Weight: 307 lb   Body Composition  Body Fat %: 49.8 % Fat Mass (lbs): 97.2 lbs Muscle Mass (lbs): 92.8 lbs Total Body Water (lbs): 75.4 lbs Visceral Fat Rating : 17   Other Clinical Data Fasting: No Labs: No Today's Visit #: 63 Starting Date: 03/26/19     ASSESSMENT AND PLAN:  Diet: Margaret Reese is currently in the action stage of change. As such, her goal is to continue with weight loss efforts. She has agreed to Category 1 Plan.  Exercise: Margaret Reese has been instructed to try a geriatric exercise plan and that some exercise is better than none for weight loss and overall health benefits.   Behavior Modification:  We discussed the following Behavioral Modification Strategies today: increasing lean protein intake, decreasing simple carbohydrates, increasing vegetables, increase H2O intake, increase high fiber foods, meal planning and cooking strategies, emotional eating strategies , avoiding temptations, and planning for success. We discussed various medication options to help Margaret Reese with her weight  loss efforts and we both agreed to start bupropion for cravings and stress and continue other current treatment plan, continue to work on nutritional and behavioral strategies to promote weight loss.  .  Return in about 8 weeks (around 09/19/2023).Aaron Aas She was informed of the importance of frequent follow up visits to maximize her success with intensive lifestyle modifications for her multiple health conditions.  Attestation Statements:   Reviewed by clinician on day of visit: allergies, medications, problem list, medical history, surgical history, family history, social history, and previous encounter notes.   Time spent on visit including pre-visit chart review and post-visit care and charting was 45 minutes.     Margaret Maduro, PA-C

## 2023-07-25 ENCOUNTER — Ambulatory Visit (INDEPENDENT_AMBULATORY_CARE_PROVIDER_SITE_OTHER): Admitting: Physician Assistant

## 2023-07-25 ENCOUNTER — Encounter (INDEPENDENT_AMBULATORY_CARE_PROVIDER_SITE_OTHER): Payer: Self-pay | Admitting: Physician Assistant

## 2023-07-25 VITALS — BP 139/79 | HR 89 | Temp 97.8°F | Ht 61.0 in | Wt 195.0 lb

## 2023-07-25 DIAGNOSIS — E669 Obesity, unspecified: Secondary | ICD-10-CM | POA: Diagnosis not present

## 2023-07-25 DIAGNOSIS — Z6836 Body mass index (BMI) 36.0-36.9, adult: Secondary | ICD-10-CM

## 2023-07-25 DIAGNOSIS — E785 Hyperlipidemia, unspecified: Secondary | ICD-10-CM

## 2023-07-25 DIAGNOSIS — M25551 Pain in right hip: Secondary | ICD-10-CM

## 2023-07-25 DIAGNOSIS — F439 Reaction to severe stress, unspecified: Secondary | ICD-10-CM

## 2023-07-25 DIAGNOSIS — I10 Essential (primary) hypertension: Secondary | ICD-10-CM

## 2023-07-25 DIAGNOSIS — E782 Mixed hyperlipidemia: Secondary | ICD-10-CM

## 2023-07-25 DIAGNOSIS — E559 Vitamin D deficiency, unspecified: Secondary | ICD-10-CM

## 2023-07-25 DIAGNOSIS — G4733 Obstructive sleep apnea (adult) (pediatric): Secondary | ICD-10-CM

## 2023-07-25 DIAGNOSIS — F5089 Other specified eating disorder: Secondary | ICD-10-CM

## 2023-07-25 MED ORDER — BUPROPION HCL ER (SR) 100 MG PO TB12: 100.0000 mg | ORAL_TABLET | Freq: Every day | ORAL | 1 refills | Status: DC

## 2023-08-01 ENCOUNTER — Telehealth (INDEPENDENT_AMBULATORY_CARE_PROVIDER_SITE_OTHER): Admitting: Psychology

## 2023-08-16 ENCOUNTER — Ambulatory Visit (INDEPENDENT_AMBULATORY_CARE_PROVIDER_SITE_OTHER): Admitting: Orthopaedic Surgery

## 2023-08-16 ENCOUNTER — Other Ambulatory Visit: Payer: Self-pay

## 2023-08-16 ENCOUNTER — Other Ambulatory Visit (INDEPENDENT_AMBULATORY_CARE_PROVIDER_SITE_OTHER): Payer: Self-pay

## 2023-08-16 ENCOUNTER — Encounter: Payer: Self-pay | Admitting: Orthopaedic Surgery

## 2023-08-16 DIAGNOSIS — Z96641 Presence of right artificial hip joint: Secondary | ICD-10-CM | POA: Diagnosis not present

## 2023-08-16 DIAGNOSIS — M79604 Pain in right leg: Secondary | ICD-10-CM | POA: Diagnosis not present

## 2023-08-16 DIAGNOSIS — G8929 Other chronic pain: Secondary | ICD-10-CM | POA: Diagnosis not present

## 2023-08-16 DIAGNOSIS — M5441 Lumbago with sciatica, right side: Secondary | ICD-10-CM | POA: Diagnosis not present

## 2023-08-16 NOTE — Progress Notes (Signed)
 The patient is a 78 year old female who is almost 9 years out from a right total hip arthroplasty.  She comes in today ambulating with a cane.  She has been having a hard time getting up from a seated position and up from the floor when she is down on the floor.  She reports sciatic pain on the right side and also some left lower extremity and right lower extremity radicular types of symptoms.  She is not a diabetic.  She takes anti-inflammatories over-the-counter on a rare basis.  She denies any change in bowel or bladder function.  On exam both hips move smoothly and fluidly with no blocks or rotation.  Most of her pain seems to be with positive straight leg raise on both sides more so on the right than the left but she does have left lower extremity radicular symptoms as well.  2 views of the lumbar spine show a significant grade 1 spondylolisthesis at L4-L5 with some degenerative changes as well.  An AP pelvis and lateral of the right hip shows a well-seated right total hip arthroplasty.  There is moderate arthritis of the left hip.  I think she would benefit the most from outpatient physical therapy to work on any modalities that can strengthen her back and also calm down her radicular symptoms going down her legs.  She would like to be able to get up from the ground easier and I think therapy can certainly help her with this.  Will work on setting her up for outpatient physical therapy and then we will see her back in a month to see how she is doing overall.  All questions and concerns were addressed and answered.  She agrees with this treatment plan.

## 2023-08-22 ENCOUNTER — Telehealth (INDEPENDENT_AMBULATORY_CARE_PROVIDER_SITE_OTHER): Admitting: Psychology

## 2023-08-22 DIAGNOSIS — F419 Anxiety disorder, unspecified: Secondary | ICD-10-CM

## 2023-08-22 DIAGNOSIS — F32A Depression, unspecified: Secondary | ICD-10-CM | POA: Diagnosis not present

## 2023-08-22 DIAGNOSIS — F5089 Other specified eating disorder: Secondary | ICD-10-CM

## 2023-08-22 NOTE — Progress Notes (Signed)
  Office: [phone]  /  Fax: [phone]    Date: August 22, 2023  Appointment Start Time: 8:37am Duration: 32 minutes Provider: Wyatt Fire, Psy.D. Type of Session: Individual Therapy  Location of Patient: Home (private location) Location of Provider: Provider's Home (private office) Type of Contact: Telepsychological Visit via MyChart Video Visit  Session Content: Margaret Reese is a 78 y.o. female presenting for a follow-up appointment to address the previously established treatment goal of increasing coping skills. Today's appointment was a telepsychological visit. Margaret Reese provided verbal consent for today's telepsychological appointment and she is aware she is responsible for securing confidentiality on her end of the session. Prior to proceeding with today's appointment, Margaret Reese's physical location at the time of this appointment was obtained as well a phone number she could be reached at in the event of technical difficulties. Margaret Reese and this provider participated in today's telepsychological service.   This provider conducted a brief check-in and discussed recent events (e.g., chronic pain, husband's 80[age] birthday), as the last appointment with this provider was on 06/05/2023. Assessed recent stress. She shared she continues to experience stress, but noted I'm feeling a little bit more in control. Psychoeducation provided regarding fight or flight (or freeze) response and its impact on eating habits. Discussed strategies for stress management (e.g., engaging in physical activity, journaling, minimizing screen time, eating a balanced diet, and crafting). Overall, Margaret Reese was receptive to today's appointment as evidenced by openness to sharing, responsiveness to feedback, and willingness to implement discussed strategies .  Mental Status Examination:  Appearance: neat Behavior: appropriate to circumstances Mood: neutral Affect: mood congruent Speech: WNL Eye Contact: appropriate Psychomotor  Activity: WNL Gait: unable to assess Thought Process: linear, logical, and goal directed and no evidence or endorsement of suicidal, homicidal, and self-harm ideation, plan and intent  Thought Content/Perception: no hallucinations, delusions, bizarre thinking or behavior endorsed or observed Orientation: AAOx4 Memory/Concentration: intact Insight: fair Judgment: fair  Interventions:  Conducted a brief chart review Provided empathic reflections and validation Provided positive reinforcement Employed supportive psychotherapy interventions to facilitate reduced distress and to improve coping skills with identified stressors Psychoeducation provided regarding the fight or flight (and freeze) system and coping Engaged in problem solving  DSM-5 Diagnosis(es): F50.89 Other Specified Feeding or Eating Disorder, Emotional Eating Behaviors, F41.9 Unspecified Anxiety Disorder, and  F32.A Unspecified Depressive Disorder  Treatment Goal & Progress: During the initial appointment with this provider, the following treatment goal was established: increase coping skills. Margaret Reese has demonstrated progress in her goal as evidenced by increased awareness of hunger patterns. Margaret Reese also continues to demonstrate willingness to engage in learned skill(s).   Plan: The next appointment is scheduled for 09/04/2023 at 3:30pm, which will be via MyChart Video Visit. The next session will focus on working towards the established treatment goal.   Wyatt Fire, PsyD

## 2023-09-04 ENCOUNTER — Telehealth (INDEPENDENT_AMBULATORY_CARE_PROVIDER_SITE_OTHER): Admitting: Psychology

## 2023-09-04 DIAGNOSIS — F32A Depression, unspecified: Secondary | ICD-10-CM | POA: Diagnosis not present

## 2023-09-04 DIAGNOSIS — F419 Anxiety disorder, unspecified: Secondary | ICD-10-CM | POA: Diagnosis not present

## 2023-09-04 DIAGNOSIS — F5089 Other specified eating disorder: Secondary | ICD-10-CM

## 2023-09-04 NOTE — Progress Notes (Signed)
  Office: [phone]  /  Fax: [phone]    Date: September 04, 2023  Appointment Start Time: 3:18pm Duration: 32 minutes Provider: Wyatt Fire, Psy.D. Type of Session: Individual Therapy  Location of Patient: Home (private location) Location of Provider: Provider's Home (private office) Type of Contact: Telepsychological Visit via MyChart Video Visit  Session Content: Margaret Reese is a 78 y.o. female presenting for a follow-up appointment to address the previously established treatment goal of increasing coping skills. Today's appointment was a telepsychological visit. [name] provided verbal consent for today's telepsychological appointment and she is aware she is responsible for securing confidentiality on her end of the session. Prior to proceeding with today's appointment, [name]'s physical location at the time of this appointment was obtained as well a phone number she could be reached at in the event of technical difficulties. Ameliyah and this provider participated in today's telepsychological service.   This provider conducted a brief check-in. [name] stated her nephew and family visited for her holiday. She further shared she is continuing to have challenges with walking due to pain, noting she will be starting physical therapy tomorrow. Reviewed content from the last appointment. [name] shared she journaled daily to help reduce stress. She shared about her experience. This provider recommended adding a gratitude component to her daily journaling and discussed benefits, including its impact on emotional and physical well-being, reducing stress, and reducing emotional eating behaviors. Overall, [name] was receptive to today's appointment as evidenced by openness to sharing, responsiveness to feedback, and willingness to implement discussed strategies .  Mental Status Examination:  Appearance: neat Behavior: appropriate to circumstances Mood: neutral Affect: mood congruent Speech: WNL Eye Contact:  appropriate Psychomotor Activity: WNL Gait: unable to assess Thought Process: linear, logical, and goal directed and no evidence or endorsement of suicidal, homicidal, and self-harm ideation, plan and intent  Thought Content/Perception: no hallucinations, delusions, bizarre thinking or behavior endorsed or observed Orientation: AAOx4 Memory/Concentration: intact Insight: fair Judgment: fair  Interventions:  Conducted a brief chart review Provided empathic reflections and validation Reviewed content from the previous session Provided positive reinforcement Employed supportive psychotherapy interventions to facilitate reduced distress and to improve coping skills with identified stressors Psychoeducation provided regarding gratitude   DSM-5 Diagnosis(es): F50.89 Other Specified Feeding or Eating Disorder, Emotional Eating Behaviors, F41.9 Unspecified Anxiety Disorder, and  F32.A Unspecified Depressive Disorder  Treatment Goal & Progress: During the initial appointment with this provider, the following treatment goal was established: increase coping skills. [name] has demonstrated progress in her goal as evidenced by increased awareness of hunger patterns. [name] also continues to demonstrate willingness to engage in learned skill(s).   Plan: The next appointment is scheduled for 09/19/2023 at 8:30am, which will be via MyChart Video Visit. The next session will focus on working towards the established treatment goal.   Wyatt Fire, PsyD

## 2023-09-04 NOTE — Progress Notes (Signed)
 Remote pacemaker transmission.

## 2023-09-04 NOTE — Therapy (Signed)
 OUTPATIENT PHYSICAL THERAPY THORACOLUMBAR EVALUATION   Patient Name: Margaret Reese MRN: [id number] DOB:1945/12/06, 78 y.o., female Today's Date: 09/05/2023  END OF SESSION:  PT End of Session - 09/05/23 1011     Visit Number 1    Date for PT Re-Evaluation 11/14/23    Authorization Type Medicare    PT Start Time 1015    PT Stop Time 1100    PT Time Calculation (min) 45 min          Past Medical History:  Diagnosis Date   Allergy    Anxiety    Arthritis    Asthma    Complication of anesthesia    pt states has difficulty awakening; also has increased sinus drainage   Constipation    Edema, lower extremity    Encounter for interrogation of cardiac pacemaker 09/05/2018   Falls    GERD (gastroesophageal reflux disease)    H/O back injury    History of bronchitis    History of colon polyps    Hypertension    Imbalance    Kidney cysts    pt states not sure which kidney does see kidney specialist yearly pt states every thing okay currently    Legally blind in right eye, as defined in [place]     Mobitz type II atrioventricular block 11/08/2016   Multiple gastric ulcers    Obesity    OSA (obstructive sleep apnea) 05/03/2022   Pacemaker S/P St Jude Medical Assurity MRI model [id number] 05/20/2016   Renal stone    Retinal vein occlusion    Shortness of breath    Sinus node dysfunction (HCC) 12/16/2018   Tingling    left arm    Trace cataracts    Urinary frequency    Vitamin D  deficiency    Past Surgical History:  Procedure Laterality Date   CARDIOVASCULAR STRESS TEST  dec 2016   CHOLECYSTECTOMY     COLONOSCOPY  2011   EYE SURGERY Bilateral    Cataract surgery.    LEFT HEART CATH AND CORONARY ANGIOGRAPHY N/A 05/19/2016   Procedure: Left Heart Cath and Coronary Angiography;  Surgeon: Salena Negri, MD;  Location: [facility];  Service: Cardiovascular;  Laterality: N/A;   PACEMAKER IMPLANT N/A 05/20/2016   Procedure: Pacemaker Implant;  Surgeon: Will Gladis Norton, MD;   Location: [facility];  Service: Cardiovascular;  Laterality: N/A;   TONSILLECTOMY     TOTAL HIP ARTHROPLASTY Right 01/30/2015   Procedure: RIGHT TOTAL HIP ARTHROPLASTY ANTERIOR APPROACH AND REMOVAL LIPOMA RIGHT HIP;  Surgeon: Lonni CINDERELLA Poli, MD;  Location: [facility];  Service: Orthopedics;  Laterality: Right;   UPPER GI ENDOSCOPY     Patient Active Problem List   Diagnosis Date Noted   Back pain 12/26/2022   Fluid retention in legs 11/13/2022   Stress 06/14/2022   Obesity (HCC)- Start BMI 40.06 05/12/2022   OSA (obstructive sleep apnea) 05/03/2022   Hyperglycemia 03/15/2022   Heart block AV complete (HCC) 01/05/2022   Pulmonary hypertension (HCC) 11/19/2021   Chronic diastolic (congestive) heart failure (HCC) 06/01/2020   Pacemaker S/P St Jude Medical Assurity MRI model [id number] 05/20/2016   History of total right hip arthroplasty 01/30/2015   Arthritis 09/11/2014   Renal cyst 09/04/2014   Allergic rhinitis due to pollen 09/04/2014   Essential hypertension 07/08/2010   GERD (gastroesophageal reflux disease) 07/08/2010   Calcium oxalate renal stones 07/08/2010   Hyperlipidemia 07/08/2010    PCP: Norleen Jobs  REFERRING PROVIDER: Lonni Poli  REFERRING DIAG:  S03.358 (ICD-10-CM) - History of total right hip arthroplasty  M54.41,G89.29 (ICD-10-CM) - Chronic right-sided low back pain with right-sided sciatica    Rationale for Evaluation and Treatment: Rehabilitation  THERAPY DIAG:  Radiculopathy, lumbosacral region  Bilateral hip pain  Muscle weakness (generalized)  ONSET DATE: 08/16/23 referral  SUBJECTIVE:                                                                                                                                                                                           SUBJECTIVE STATEMENT: I started getting pain, and I thought it was my replacement. The pain was shooting from my groin and across the thigh. Now I have pain on both  sides. I almost fell at the grocery store reaching down to grab something. It felt like electric shock down my leg. Stairs are hard to do because it is painful.   PERTINENT HISTORY:  Patient is a 78 year old female who is almost 9 years out from a right total hip arthroplasty.  She comes in today ambulating with a cane.  She has been having a hard time getting up from a seated position and up from the floor when she is down on the floor.  She reports sciatic pain on the right side and also some left lower extremity and right lower extremity radicular types of symptoms.  She is not a diabetic.  She takes anti-inflammatories over-the-counter on a rare basis.  She denies any change in bowel or bladder function.   On exam both hips move smoothly and fluidly with no blocks or rotation.  Most of her pain seems to be with positive straight leg raise on both sides more so on the right than the left but she does have left lower extremity radicular symptoms as well.  PAIN:  Are you having pain? Yes: NPRS scale: 5/10 Pain location: both hips, into legs, some pain in low back Pain description: becoming more frequent, electrical shock, achy  Aggravating factors: bending, standing too long Relieving factors: nothing really  PRECAUTIONS: None  RED FLAGS: None   WEIGHT BEARING RESTRICTIONS: No  FALLS:  Has patient fallen in last 6 months? No  LIVING ENVIRONMENT: Lives with: lives with their spouse Lives in: House/apartment Stairs: Yes: External: 5 steps; bilateral but cannot reach both Has following equipment at home: Single point cane and Walker - 2 wheeled  PLOF: Independent and Independent with basic ADLs  PATIENT GOALS: to not hurt and I don't want to risk falling  NEXT MD VISIT: 09/20/23  OBJECTIVE:  Note: Objective measures were completed at Evaluation unless otherwise noted.  DIAGNOSTIC FINDINGS:  2 views of the lumbar spine show a significant grade 1 spondylolisthesis at L4-L5 with  some degenerative changes as well. An AP pelvis and lateral of the right hip shows a well-seated right total hip arthroplasty. There is moderate arthritis of the left hip.    COGNITION: Overall cognitive status: Within functional limits for tasks assessed     SENSATION: WFL   POSTURE: rounded shoulders and forward head  PALPATION: Some tenderness in L2-L5  LUMBAR ROM:   AROM eval  Flexion Can touch toes, painful coming back up  Extension 25% with pain  Right lateral flexion Mid thigh with pain  Left lateral flexion Fibular head no pain  Right rotation 25%  Left rotation 50%   (Blank rows = not tested)  LOWER EXTREMITY ROM:   grossly within functional ranges   LOWER EXTREMITY MMT:  grossly 5/5, some pain with resisted knee extension on RLE, hip extension 4/5  LUMBAR SPECIAL TESTS:  Straight leg raise test: Positive and FABER test: Positive  FUNCTIONAL TESTS:  5 times sit to stand: 10.39s Timed up and go (TUG): 15.53s w/o cane  GAIT: Distance walked: in clinic distances Assistive device utilized: Single point cane Level of assistance: Complete Independence  TREATMENT DATE: 09/05/23 EVAL                                                                                                                                PATIENT EDUCATION:  Education details: POC, HEP, sciatica, hip flexor tightness sleeping in recliners Person educated: Patient Education method: Explanation and Demonstration Education comprehension: verbalized understanding and returned demonstration  HOME EXERCISE PROGRAM: Access Code: [email or web address] URL: [email or web address] Date: 09/05/2023 Prepared by: Almetta Fam  Exercises - Supine Lower Trunk Rotation  - 1 x daily - 7 x weekly - 2 sets - 10 reps - Supine Bridge  - 1 x daily - 7 x weekly - 2 sets - 10 reps - Supine Figure 4 Piriformis Stretch  - 1 x daily - 7 x weekly - 2 reps - 15 hold - Modified Thomas Stretch  - 1 x daily - 7 x  weekly - 2 reps - 15 hold  ASSESSMENT:  CLINICAL IMPRESSION: Patient is a 78 y.o. female who was seen today for physical therapy evaluation and treatment for LBP and hip pain. She started using her cane again a few months ago when her pain started becoming more of an issue. She states she mostly takes it outside the house, especially when she knows she has to walk a distance. Patient reports sleeping in the recliner since her hip surgery because pain in the knees and hip and difficulty getting out of bed. She would like to be able to sleep in her bed again at least for a few hours at a time. Her images show grade 1 spondylolisthesis at L4-L5 with some degenerative changes as well. Patient reports some issues with depth perception so she has to be  careful with steps, curbs, and stairs. Patient will benefit from skilled PT to address her LBP and hip pain to increase her activity tolerance.  OBJECTIVE IMPAIRMENTS: Abnormal gait, decreased activity tolerance, decreased balance, decreased ROM, decreased strength, and pain.   ACTIVITY LIMITATIONS: bending, squatting, stairs, and locomotion level  PARTICIPATION LIMITATIONS: cleaning, laundry, driving, shopping, community activity, and yard work  PERSONAL FACTORS: Age, Past/current experiences, Time since onset of injury/illness/exacerbation, and 1-2 comorbidities: arthritis, hx of surgery are also affecting patient's functional outcome.   REHAB POTENTIAL: Good  CLINICAL DECISION MAKING: Stable/uncomplicated  EVALUATION COMPLEXITY: Low  GOALS: Goals reviewed with patient? Yes  SHORT TERM GOALS: Target date: 10/10/23  Patient will be independent with initial HEP.  Baseline:  Goal status: INITIAL  2.  Patient will report centralization of radicular symptoms.  Baseline: pain in both legs Goal status: INITIAL   LONG TERM GOALS: Target date: 11/14/23  Patient will be independent with advanced/ongoing HEP to improve outcomes and carryover.   Baseline:  Goal status: INITIAL  2.  Patient will report 50-75% improvement in low back pain to improve QOL.  Baseline: 5/10 Goal status: INITIAL  3.  Patient will demonstrate full pain free lumbar ROM to perform ADLs.   Baseline: see chart Goal status: INITIAL  4.  Patient will be up and down a set of stairs without pain.  Baseline: pain going up and down Goal status: INITIAL  5.  Patient will be able to sleep in her bed again for at least 3-4 hrs.  Baseline: 1 hr tolerance  Goal status: INITIAL   6.  Patient will tolerate 30 min of standing to perform ADLs and household chores Baseline: ~10 mins Goal status: INITIAL  PLAN:  PT FREQUENCY: 2x/week  PT DURATION: 10 weeks  PLANNED INTERVENTIONS: 97110-Therapeutic exercises, 97530- Therapeutic activity, 97112- Neuromuscular re-education, 97535- Self Care, 02859- Manual therapy, [phone]- Gait training, [phone]- Electrical stimulation (unattended), 97016- Vasopneumatic device, [id number]- Traction (mechanical), 20560 (1-2 muscles), 20561 (3+ muscles)- Dry Needling, Patient/Family education, Balance training, Stair training, Taping, Joint mobilization, Spinal mobilization, Cryotherapy, and Moist heat.  PLAN FOR NEXT SESSION: LBP stretches and mobility, does not tolerate laying on her back for too long, hip strengthening    Almetta Fam, PT 09/05/2023, 11:06 AM

## 2023-09-04 NOTE — Addendum Note (Signed)
 Addended by: TAWNI DRILLING D on: 09/04/2023 03:02 PM   Modules accepted: Orders

## 2023-09-05 ENCOUNTER — Ambulatory Visit: Attending: Orthopaedic Surgery

## 2023-09-05 DIAGNOSIS — M5417 Radiculopathy, lumbosacral region: Secondary | ICD-10-CM | POA: Insufficient documentation

## 2023-09-05 DIAGNOSIS — M25551 Pain in right hip: Secondary | ICD-10-CM | POA: Insufficient documentation

## 2023-09-05 DIAGNOSIS — Z96641 Presence of right artificial hip joint: Secondary | ICD-10-CM | POA: Insufficient documentation

## 2023-09-05 DIAGNOSIS — M5441 Lumbago with sciatica, right side: Secondary | ICD-10-CM | POA: Insufficient documentation

## 2023-09-05 DIAGNOSIS — M25552 Pain in left hip: Secondary | ICD-10-CM | POA: Diagnosis not present

## 2023-09-05 DIAGNOSIS — M6281 Muscle weakness (generalized): Secondary | ICD-10-CM | POA: Insufficient documentation

## 2023-09-05 DIAGNOSIS — G8929 Other chronic pain: Secondary | ICD-10-CM | POA: Diagnosis not present

## 2023-09-14 ENCOUNTER — Ambulatory Visit: Admitting: Physical Therapy

## 2023-09-14 DIAGNOSIS — M6281 Muscle weakness (generalized): Secondary | ICD-10-CM | POA: Diagnosis not present

## 2023-09-14 DIAGNOSIS — M25551 Pain in right hip: Secondary | ICD-10-CM

## 2023-09-14 DIAGNOSIS — M25552 Pain in left hip: Secondary | ICD-10-CM | POA: Diagnosis not present

## 2023-09-14 DIAGNOSIS — M5417 Radiculopathy, lumbosacral region: Secondary | ICD-10-CM | POA: Diagnosis not present

## 2023-09-14 DIAGNOSIS — Z96641 Presence of right artificial hip joint: Secondary | ICD-10-CM | POA: Diagnosis not present

## 2023-09-14 DIAGNOSIS — M5441 Lumbago with sciatica, right side: Secondary | ICD-10-CM | POA: Diagnosis not present

## 2023-09-14 NOTE — Therapy (Signed)
 OUTPATIENT PHYSICAL THERAPY THORACOLUMBAR    Patient Name: Margaret Reese MRN: [id number] DOB:October 10, 1945, 78 y.o., female Today's Date: 09/14/2023  END OF SESSION:  PT End of Session - 09/14/23 0855     Visit Number 2    Date for PT Re-Evaluation 11/14/23    Authorization Type Medicare    PT Start Time 0845    PT Stop Time 0930    PT Time Calculation (min) 45 min          Past Medical History:  Diagnosis Date   Allergy    Anxiety    Arthritis    Asthma    Complication of anesthesia    pt states has difficulty awakening; also has increased sinus drainage   Constipation    Edema, lower extremity    Encounter for interrogation of cardiac pacemaker 09/05/2018   Falls    GERD (gastroesophageal reflux disease)    H/O back injury    History of bronchitis    History of colon polyps    Hypertension    Imbalance    Kidney cysts    pt states not sure which kidney does see kidney specialist yearly pt states every thing okay currently    Legally blind in right [place], as defined in [place]     Mobitz type II atrioventricular block 11/08/2016   Multiple gastric ulcers    Obesity    OSA (obstructive sleep apnea) 05/03/2022   Pacemaker S/P St Jude Medical Assurity MRI model [id number] 05/20/2016   Renal stone    Retinal vein occlusion    Shortness of breath    Sinus node dysfunction (HCC) 12/16/2018   Tingling    left arm    Trace cataracts    Urinary frequency    Vitamin D  deficiency    Past Surgical History:  Procedure Laterality Date   CARDIOVASCULAR STRESS TEST  dec 2016   CHOLECYSTECTOMY     COLONOSCOPY  2011   EYE SURGERY Bilateral    Cataract surgery.    LEFT HEART CATH AND CORONARY ANGIOGRAPHY N/A 05/19/2016   Procedure: Left Heart Cath and Coronary Angiography;  Surgeon: Salena Negri, MD;  Location: [facility];  Service: Cardiovascular;  Laterality: N/A;   PACEMAKER IMPLANT N/A 05/20/2016   Procedure: Pacemaker Implant;  Surgeon: Will Gladis Norton, MD;  Location:  [facility];  Service: Cardiovascular;  Laterality: N/A;   TONSILLECTOMY     TOTAL HIP ARTHROPLASTY Right 01/30/2015   Procedure: RIGHT TOTAL HIP ARTHROPLASTY ANTERIOR APPROACH AND REMOVAL LIPOMA RIGHT HIP;  Surgeon: Lonni CINDERELLA Poli, MD;  Location: [facility];  Service: Orthopedics;  Laterality: Right;   UPPER GI ENDOSCOPY     Patient Active Problem List   Diagnosis Date Noted   Back pain 12/26/2022   Fluid retention in legs 11/13/2022   Stress 06/14/2022   Obesity (HCC)- Start BMI 40.06 05/12/2022   OSA (obstructive sleep apnea) 05/03/2022   Hyperglycemia 03/15/2022   Heart block AV complete (HCC) 01/05/2022   Pulmonary hypertension (HCC) 11/19/2021   Chronic diastolic (congestive) heart failure (HCC) 06/01/2020   Pacemaker S/P St Jude Medical Assurity MRI model EF7727 05/20/2016   History of total right hip arthroplasty 01/30/2015   Arthritis 09/11/2014   Renal cyst 09/04/2014   Allergic rhinitis due to pollen 09/04/2014   Essential hypertension 07/08/2010   GERD (gastroesophageal reflux disease) 07/08/2010   Calcium oxalate renal stones 07/08/2010   Hyperlipidemia 07/08/2010    PCP: Norleen Jobs  REFERRING PROVIDER: Lonni Poli  REFERRING DIAG:  S03.358 (ICD-10-CM) - History of total right hip arthroplasty  M54.41,G89.29 (ICD-10-CM) - Chronic right-sided low back pain with right-sided sciatica    Rationale for Evaluation and Treatment: Rehabilitation  THERAPY DIAG:  Radiculopathy, lumbosacral region  Bilateral hip pain  Muscle weakness (generalized)  ONSET DATE: 08/16/23 referral  SUBJECTIVE:                                                                                                                                                                                           SUBJECTIVE STATEMENT: doing okay, ROM is limited with HEP but doing   I started getting pain, and I thought it was my replacement. The pain was shooting from my groin and  across the thigh. Now I have pain on both sides. I almost fell at the grocery store reaching down to grab something. It felt like electric shock down my leg. Stairs are hard to do because it is painful.   PERTINENT HISTORY:  Patient is a 78 year old female who is almost 9 years out from a right total hip arthroplasty.  She comes in today ambulating with a cane.  She has been having a hard time getting up from a seated position and up from the floor when she is down on the floor.  She reports sciatic pain on the right side and also some left lower extremity and right lower extremity radicular types of symptoms.  She is not a diabetic.  She takes anti-inflammatories over-the-counter on a rare basis.  She denies any change in bowel or bladder function.   On exam both hips move smoothly and fluidly with no blocks or rotation.  Most of her pain seems to be with positive straight leg raise on both sides more so on the right than the left but she does have left lower extremity radicular symptoms as well.  PAIN:  Are you having pain? Yes: NPRS scale: 5/10 Pain location: both hips, into legs, some pain in low back Pain description: becoming more frequent, electrical shock, achy  Aggravating factors: bending, standing too long Relieving factors: nothing really  PRECAUTIONS: None  RED FLAGS: None   WEIGHT BEARING RESTRICTIONS: No  FALLS:  Has patient fallen in last 6 months? No  LIVING ENVIRONMENT: Lives with: lives with their spouse Lives in: House/apartment Stairs: Yes: External: 5 steps; bilateral but cannot reach both Has following equipment at home: Single point cane and Walker - 2 wheeled  PLOF: Independent and Independent with basic ADLs  PATIENT GOALS: to not hurt and I don't want to risk falling  NEXT MD VISIT: 09/20/23  OBJECTIVE:  Note: Objective measures  were completed at Evaluation unless otherwise noted.  DIAGNOSTIC FINDINGS:  2 views of the lumbar spine show a significant  grade 1 spondylolisthesis at L4-L5 with some degenerative changes as well. An AP pelvis and lateral of the right hip shows a well-seated right total hip arthroplasty. There is moderate arthritis of the left hip.    COGNITION: Overall cognitive status: Within functional limits for tasks assessed     SENSATION: WFL   POSTURE: rounded shoulders and forward head  PALPATION: Some tenderness in L2-L5  LUMBAR ROM:   AROM eval  Flexion Can touch toes, painful coming back up  Extension 25% with pain  Right lateral flexion Mid thigh with pain  Left lateral flexion Fibular head no pain  Right rotation 25%  Left rotation 50%   (Blank rows = not tested)  LOWER EXTREMITY ROM:   grossly within functional ranges   LOWER EXTREMITY MMT:  grossly 5/5, some pain with resisted knee extension on RLE, hip extension 4/5  LUMBAR SPECIAL TESTS:  Straight leg raise test: Positive and FABER test: Positive  FUNCTIONAL TESTS:  5 times sit to stand: 10.39s Timed up and go (TUG): 15.53s w/o cane  GAIT: Distance walked: in clinic distances Assistive device utilized: Single point cane Level of assistance: Complete Independence  TREATMENT DATE:  09/14/23 Nustep L 5 LE only at 4 1/2 min decreased resistance to 3 Black tband trunk ext 2 sets 10 ADD ball squeeze 2 sets 10 Clams red tband 2 sets 10  Red tband hip flex 2 sets 10 Iso abdominals 2 set s 10 Wt ball chest press then obl 10 x each seated 3# alt LAQ 20 x 3 # hip abd with flexion 10 x BIL 2 sets  Standing 3# hip ext and abd 10 x Legs on ball bridge 2 sets 10 Legs on ball obl 20 x     09/05/23 EVAL                                                                                                                                PATIENT EDUCATION:  Education details: POC, HEP, sciatica, hip flexor tightness sleeping in recliners Person educated: Patient Education method: Medical illustrator Education comprehension:  verbalized understanding and returned demonstration  HOME EXERCISE PROGRAM: Access Code: [email or web address] URL: [email or web address] Date: 09/05/2023 Prepared by: Almetta Fam  Exercises - Supine Lower Trunk Rotation  - 1 x daily - 7 x weekly - 2 sets - 10 reps - Supine Bridge  - 1 x daily - 7 x weekly - 2 sets - 10 reps - Supine Figure 4 Piriformis Stretch  - 1 x daily - 7 x weekly - 2 reps - 15 hold - Modified Thomas Stretch  - 1 x daily - 7 x weekly - 2 reps - 15 hold  ASSESSMENT:  CLINICAL IMPRESSION: pt arrives for first visit after eval, states doing HEP with limited ROM. Pt tolerated all ex  very well but needed constant cuing for speed and control of ex. Left leg 1 inch shorter than RT  ( pt states its bene like this since surgery). Very tight in Left hip rotators.   Patient is a 78 y.o. female who was seen today for physical therapy evaluation and treatment for LBP and hip pain. She started using her cane again a few months ago when her pain started becoming more of an issue. She states she mostly takes it outside the house, especially when she knows she has to walk a distance. Patient reports sleeping in the recliner since her hip surgery because pain in the knees and hip and difficulty getting out of bed. She would like to be able to sleep in her bed again at least for a few hours at a time. Her images show grade 1 spondylolisthesis at L4-L5 with some degenerative changes as well. Patient reports some issues with depth perception so she has to be careful with steps, curbs, and stairs. Patient will benefit from skilled PT to address her LBP and hip pain to increase her activity tolerance.  OBJECTIVE IMPAIRMENTS: Abnormal gait, decreased activity tolerance, decreased balance, decreased ROM, decreased strength, and pain.   ACTIVITY LIMITATIONS: bending, squatting, stairs, and locomotion level  PARTICIPATION LIMITATIONS: cleaning, laundry, driving, shopping, community activity,  and yard work  PERSONAL FACTORS: Age, Past/current experiences, Time since onset of injury/illness/exacerbation, and 1-2 comorbidities: arthritis, hx of surgery are also affecting patient's functional outcome.   REHAB POTENTIAL: Good  CLINICAL DECISION MAKING: Stable/uncomplicated  EVALUATION COMPLEXITY: Low  GOALS: Goals reviewed with patient? Yes  SHORT TERM GOALS: Target date: 10/10/23  Patient will be independent with initial HEP.  Baseline:  Goal status: 09/14/23 progressing  2.  Patient will report centralization of radicular symptoms.  Baseline: pain in both legs Goal status: INITIAL   LONG TERM GOALS: Target date: 11/14/23  Patient will be independent with advanced/ongoing HEP to improve outcomes and carryover.  Baseline:  Goal status: INITIAL  2.  Patient will report 50-75% improvement in low back pain to improve QOL.  Baseline: 5/10 Goal status: INITIAL  3.  Patient will demonstrate full pain free lumbar ROM to perform ADLs.   Baseline: see chart Goal status: INITIAL  4.  Patient will be up and down a set of stairs without pain.  Baseline: pain going up and down Goal status: INITIAL  5.  Patient will be able to sleep in her bed again for at least 3-4 hrs.  Baseline: 1 hr tolerance  Goal status: INITIAL   6.  Patient will tolerate 30 min of standing to perform ADLs and household chores Baseline: ~10 mins Goal status: INITIAL  PLAN:  PT FREQUENCY: 2x/week  PT DURATION: 10 weeks  PLANNED INTERVENTIONS: 97110-Therapeutic exercises, 97530- Therapeutic activity, 97112- Neuromuscular re-education, 97535- Self Care, 02859- Manual therapy, [phone]- Gait training, [phone]- Electrical stimulation (unattended), 97016- Vasopneumatic device, [id number]- Traction (mechanical), 20560 (1-2 muscles), 20561 (3+ muscles)- Dry Needling, Patient/Family education, Balance training, Stair training, Taping, Joint mobilization, Spinal mobilization, Cryotherapy, and Moist heat.  PLAN FOR  NEXT SESSION: LBP stretches and mobility, does not tolerate laying on her back for too long, hip strengthening    Amala Petion,ANGIE, PTA 09/14/2023, 8:56 AM

## 2023-09-19 ENCOUNTER — Encounter (INDEPENDENT_AMBULATORY_CARE_PROVIDER_SITE_OTHER): Payer: Self-pay | Admitting: Physician Assistant

## 2023-09-19 ENCOUNTER — Telehealth (INDEPENDENT_AMBULATORY_CARE_PROVIDER_SITE_OTHER): Admitting: Psychology

## 2023-09-19 ENCOUNTER — Ambulatory Visit (INDEPENDENT_AMBULATORY_CARE_PROVIDER_SITE_OTHER): Admitting: Physician Assistant

## 2023-09-19 VITALS — BP 120/70 | HR 79 | Ht 61.0 in | Wt 190.0 lb

## 2023-09-19 DIAGNOSIS — E559 Vitamin D deficiency, unspecified: Secondary | ICD-10-CM

## 2023-09-19 DIAGNOSIS — M5431 Sciatica, right side: Secondary | ICD-10-CM

## 2023-09-19 DIAGNOSIS — E669 Obesity, unspecified: Secondary | ICD-10-CM

## 2023-09-19 DIAGNOSIS — F439 Reaction to severe stress, unspecified: Secondary | ICD-10-CM

## 2023-09-19 DIAGNOSIS — F32A Depression, unspecified: Secondary | ICD-10-CM

## 2023-09-19 DIAGNOSIS — F5089 Other specified eating disorder: Secondary | ICD-10-CM | POA: Diagnosis not present

## 2023-09-19 DIAGNOSIS — F419 Anxiety disorder, unspecified: Secondary | ICD-10-CM | POA: Diagnosis not present

## 2023-09-19 DIAGNOSIS — I10 Essential (primary) hypertension: Secondary | ICD-10-CM

## 2023-09-19 DIAGNOSIS — Z6835 Body mass index (BMI) 35.0-35.9, adult: Secondary | ICD-10-CM | POA: Diagnosis not present

## 2023-09-19 DIAGNOSIS — M25551 Pain in right hip: Secondary | ICD-10-CM | POA: Diagnosis not present

## 2023-09-19 MED ORDER — BUPROPION HCL ER (SR) 100 MG PO TB12: 100.0000 mg | ORAL_TABLET | Freq: Every day | ORAL | 1 refills | Status: DC

## 2023-09-19 MED ORDER — VITAMIN D (ERGOCALCIFEROL) 1.25 MG (50000 UNIT) PO CAPS: 50000.0000 [IU] | ORAL_CAPSULE | ORAL | 0 refills | Status: AC

## 2023-09-19 NOTE — Progress Notes (Signed)
  Office: [phone]  /  Fax: [phone]    Date: September 19, 2023  Appointment Start Time: 8:39am Duration: 30 minutes Provider: Wyatt Fire, Psy.D. Type of Session: Individual Therapy  Location of Patient: Home (private location) Location of Provider: Provider's Home (private office) Type of Contact: Telepsychological Visit via MyChart Video Visit  Session Content: Margaret Reese is a 78 y.o. female presenting for a follow-up appointment to address the previously established treatment goal of increasing coping skills.Today's appointment was a telepsychological visit. Margaret Reese provided verbal consent for today's telepsychological appointment and she is aware she is responsible for securing confidentiality on her end of the session. Prior to proceeding with today's appointment, Margaret Reese's physical location at the time of this appointment was obtained as well a phone number she could be reached at in the event of technical difficulties. Margaret Reese and this provider participated in today's telepsychological service.   This provider conducted a brief check-in. Berma shared she started PT, noting, It has not been pleasant. She also discussed continuing to implement learned skills, specifically journaling as well as practicing gratitude and tracking its impact on her mood. Explored and processed Margaret Reese's experience of focusing on stress management. Also, discussed the impact of the aforementioned on her eating habits and weight loss goals. Notably, Margaret Reese acknowledged her eating habits are still a major problem. Further explored, including her recent eating habits. It is important to note that Margaret Reese also indicated she lost 9 pounds. The discrepancy was reflected and processed. She disclosed she is terrified about gaining weight back and that has impacted her perception of her progress to date. Thus, for homework, she was encouraged to reflect on non-scale victories in her journal. Overall, Margaret Reese was receptive to  today's appointment as evidenced by openness to sharing, responsiveness to feedback, and willingness to implement discussed strategies .  Mental Status Examination:  Appearance: neat Behavior: appropriate to circumstances Mood: neutral Affect: mood congruent Speech: WNL Eye Contact: appropriate Psychomotor Activity: WNL Gait: unable to assess Thought Process: linear, logical, and goal directed and no evidence or endorsement of suicidal, homicidal, and self-harm ideation, plan and intent  Thought Content/Perception: no hallucinations, delusions, bizarre thinking or behavior endorsed or observed Orientation: AAOx4 Memory/Concentration: intact Insight: fair Judgment: fair   Interventions:  Conducted a brief chart review Provided empathic reflections and validation Reviewed content from the previous session Provided positive reinforcement Employed supportive psychotherapy interventions to facilitate reduced distress and to improve coping skills with identified stressors Employed motivational interviewing skills to assess patient's willingness/desire to adhere to recommended medical treatments and assignments Employed acceptance and commitment interventions to emphasize mindfulness and acceptance without struggle  DSM-5 Diagnosis(es): F50.89 Other Specified Feeding or Eating Disorder, Emotional Eating Behaviors, F41.9 Unspecified Anxiety Disorder, and  F32.A Unspecified Depressive Disorder  Treatment Goal & Progress: During the initial appointment with this provider, the following treatment goal was established: increase coping skills. Margaret Reese has demonstrated progress in her goal as evidenced by increased awareness of hunger patterns. Margaret Reese also continues to demonstrate willingness to engage in learned skills (e.g., journaling).   Plan: The next appointment is scheduled for 10/03/2023 at 3:30pm, which will be via MyChart Video Visit. The next session will focus on working towards the  established treatment goal.   Wyatt Fire, PsyD

## 2023-09-19 NOTE — Progress Notes (Signed)
 SUBJECTIVE: Discussed the use of AI scribe software for clinical note transcription with the patient, who gave verbal consent to proceed.  Chief Complaint: Obesity  Interim History: She is down 5 lbs since her last visit Down 22 lbs overall TBW loss of 10.4%  Margaret Reese is here to discuss her progress with her obesity treatment plan. She is on the Category 1 Plan and states she is following her eating plan approximately 35 % of the time. She states she is exercising 30-40 minutes 2 times per week. Margaret Reese is a 78 year old female who presents for follow-up of her obesity management.  She experiences right hip pain and sciatica, which she attributes to her back. She describes her back as having a curve that causes discomfort and reports that her legs sometimes feel as if they are being beaten with a stick. She uses a cane for mobility, especially outside the house, and engages in 'furniture walking' indoors. She has difficulty sleeping in bed due to leg pain and often resorts to sitting in a recliner chair at night.  She has a history of obesity and has lost five pounds since her last visit. She has been more mindful of her eating habits, especially after reaching a weight of 200 pounds. She has been taking Wellbutrin  for emotional eating, but reports that it has not significantly reduced her cravings. Despite this, she has managed to lose weight and maintain muscle mass.  She has a history of hypertension, vitamin D  deficiency, mixed hyperlipidemia, and obstructive sleep apnea. She takes vitamin D  supplements and is due for a refill. She has not experienced any side effects from her current medications.  No bowel or bladder symptoms, no fever, and no significant changes in her overall health. She experiences pain in her legs and back. She has tried Tylenol  for pain relief, which helps her relax and sleep, but has not used any other pain medications recently.  OBJECTIVE: Visit  Diagnoses: Problem List Items Addressed This Visit     Essential hypertension   Obesity (HCC)- Start BMI 40.06   Stress   Relevant Medications   buPROPion  ER (WELLBUTRIN  SR) 100 MG 12 hr tablet   Other Visit Diagnoses       Right hip pain/sciatica    -  Primary     Vitamin D  deficiency       Relevant Medications   Vitamin D , Ergocalciferol , (DRISDOL ) 1.25 MG (50000 UNIT) CAPS capsule     BMI 35.0-35.9,adult Current BMI 35.9         Right hip and low back pain with sciatica Pain is severe, affecting mobility and sleep, possibly due to arthritis and nerve root compression. No bowel or bladder symptoms. Uses a cane for stability. - Continue physical therapy and follow up with Dr. Vernetta as instructed.  - Recommend lidocaine  patches for pain relief as she is hesitant to take medications - Encourage use of cane for stability.  Obesity with emotional eating behaviors Weight decreased by 5 pounds to 190 pounds. Emotional eating is a concern, but she is more mindful of food choices. Wellbutrin  (bupropion ) started at 100 mg for emotional eating and cravings. No significant side effects, but no increased energy or significant reduction in cravings. Blood pressure is well-controlled. Discussed increasing Wellbutrin  at this point, but she would like to stay at current dosage for another month or so.  - Continue Wellbutrin  (bupropion ) at 100 mg. - Encourage mindfulness of food choices. - Reassess Wellbutrin  effectiveness at  next visit. Meds ordered this encounter  Medications   Vitamin D , Ergocalciferol , (DRISDOL ) 1.25 MG (50000 UNIT) CAPS capsule    Sig: Take 1 capsule (50,000 Units total) by mouth every 7 (seven) days.    Dispense:  12 capsule    Refill:  0   buPROPion  ER (WELLBUTRIN  SR) 100 MG 12 hr tablet    Sig: Take 1 tablet (100 mg total) by mouth daily with breakfast.    Dispense:  30 tablet    Refill:  1    Depression Managed with Wellbutrin  (bupropion ). Some mood  improvement, but no change in energy levels. Emotional eating is a component. - Continue Wellbutrin  (bupropion ) at 100 mg.  Hypertension Hypertension asymptomatic, reasonably well controlled, and no significant medication side effects noted.  Medication(s): olmesartan  20 mg daily   Diltiazem  CD 120 mg daily  BP Readings from Last 3 Encounters:  09/19/23 120/70  07/25/23 139/79  07/11/23 112/60   Lab Results  Component Value Date   CREATININE 0.87 03/28/2023   CREATININE 0.73 09/08/2022   CREATININE 0.86 12/29/2021   No results found for: GFR  Plan: Continue all antihypertensives at current dosages. Continue to work on nutrition plan to promote weight loss and improve BP control.   Vitamin D  Deficiency Vitamin D  is not at goal of 50.  Most recent vitamin D  level was 35.9. She is on  prescription ergocalciferol  50,000 IU weekly. No N/V or muscle weakness with Ergo.  Lab Results  Component Value Date   VD25OH 35.9 03/28/2023   VD25OH 39.5 09/08/2022   VD25OH 27.5 (L) 12/29/2021    Plan: Continue and refill  prescription ergocalciferol  50,000 IU weekly Low vitamin D  levels can be associated with adiposity and may result in leptin resistance and weight gain. Also associated with fatigue.  Currently on vitamin D  supplementation without any adverse effects such as nausea, vomiting or muscle weakness.  Recheck vitamin D  level over the next visit.   Vitals BP: 120/70 Pulse Rate: 79 SpO2: 95 %   Anthropometric Measurements Height: 5' 1 (1.549 m) Weight: 190 lb (86.2 kg) BMI (Calculated): 35.92 Weight at Last Visit: 195 lb Weight Lost Since Last Visit: 5 lb Weight Gained Since Last Visit: 0 Starting Weight: 212 lb Total Weight Loss (lbs): 22 lb (9.979 kg) Peak Weight: 307 lb   Body Composition  Body Fat %: 48.7 % Fat Mass (lbs): 92.6 lbs Muscle Mass (lbs): 92.4 lbs Total Body Water (lbs): 72.4 lbs Visceral Fat Rating : 16   Other Clinical Data Fasting:  No Labs: No Today's Visit #: 48 Starting Date: 03/26/19     ASSESSMENT AND PLAN:  Diet: Lylee is currently in the action stage of change. As such, her goal is to continue with weight loss efforts. She has agreed to Category 1 Plan.  Exercise: Secily has been instructed to try a geriatric exercise plan and continue working with PT to address mobility and pain issues for weight loss and overall health benefits.   Behavior Modification:  We discussed the following Behavioral Modification Strategies today: increasing lean protein intake, decreasing simple carbohydrates, increasing vegetables, increase H2O intake, increase high fiber foods, no skipping meals, meal planning and cooking strategies, emotional eating strategies , avoiding temptations, and planning for success. We discussed various medication options to help Giuseppina with her weight loss efforts and we both agreed to continue current treatment plan.  Return in about 6 weeks (around 10/31/2023).SABRA She was informed of the importance of frequent follow up visits to  maximize her success with intensive lifestyle modifications for her multiple health conditions.  Attestation Statements:   Reviewed by clinician on day of visit: allergies, medications, problem list, medical history, surgical history, family history, social history, and previous encounter notes.   Time spent on visit including pre-visit chart review and post-visit care and charting was 35 minutes.    Braeson Rupe, PA-C

## 2023-09-20 ENCOUNTER — Ambulatory Visit: Admitting: Orthopaedic Surgery

## 2023-09-21 ENCOUNTER — Encounter: Payer: Self-pay | Admitting: Physical Therapy

## 2023-09-21 ENCOUNTER — Ambulatory Visit: Admitting: Physical Therapy

## 2023-09-21 DIAGNOSIS — M5441 Lumbago with sciatica, right side: Secondary | ICD-10-CM | POA: Diagnosis not present

## 2023-09-21 DIAGNOSIS — M25551 Pain in right hip: Secondary | ICD-10-CM

## 2023-09-21 DIAGNOSIS — M5417 Radiculopathy, lumbosacral region: Secondary | ICD-10-CM | POA: Diagnosis not present

## 2023-09-21 DIAGNOSIS — Z96641 Presence of right artificial hip joint: Secondary | ICD-10-CM | POA: Diagnosis not present

## 2023-09-21 DIAGNOSIS — M25552 Pain in left hip: Secondary | ICD-10-CM | POA: Diagnosis not present

## 2023-09-21 DIAGNOSIS — M6281 Muscle weakness (generalized): Secondary | ICD-10-CM

## 2023-09-21 NOTE — Therapy (Signed)
 OUTPATIENT PHYSICAL THERAPY THORACOLUMBAR TREATMENT    Patient Name: Vondra G Borkenhagen MRN: [id number] DOB:[birth year], 78 y.o., female Today's Date: 09/21/2023  END OF SESSION:  PT End of Session - 09/21/23 0959     Visit Number 3    Date for PT Re-Evaluation 11/14/23    Authorization Type Medicare    PT Start Time 0936    PT Stop Time 1014    PT Time Calculation (min) 38 min    Activity Tolerance Patient tolerated treatment well    Behavior During Therapy [facility] for tasks assessed/performed           Past Medical History:  Diagnosis Date   Allergy    Anxiety    Arthritis    Asthma    Complication of anesthesia    pt states has difficulty awakening; also has increased sinus drainage   Constipation    Edema, lower extremity    Encounter for interrogation of cardiac pacemaker 09/05/2018   Falls    GERD (gastroesophageal reflux disease)    H/O back injury    History of bronchitis    History of colon polyps    Hypertension    Imbalance    Kidney cysts    pt states not sure which kidney does see kidney specialist yearly pt states every thing okay currently    Legally blind in right eye, as defined in [place]     Mobitz type II atrioventricular block 11/08/2016   Multiple gastric ulcers    Obesity    OSA (obstructive sleep apnea) 05/03/2022   Pacemaker S/P [employer] Jude [employer]urity MRI model [id number] 05/20/2016   Renal stone    Retinal vein occlusion    Shortness of breath    Sinus node dysfunction (HCC) 12/16/2018   Tingling    left arm    Trace cataracts    Urinary frequency    Vitamin D  deficiency    Past Surgical History:  Procedure Laterality Date   CARDIOVASCULAR STRESS TEST  dec 2016   CHOLECYSTECTOMY     COLONOSCOPY  2011   EYE SURGERY Bilateral    Cataract surgery.    LEFT HEART CATH AND CORONARY ANGIOGRAPHY N/A 05/19/2016   Procedure: Left Heart Cath and Coronary Angiography;  Surgeon: Salena Negri, MD;  Location: [facility];  Service: Cardiovascular;   Laterality: N/A;   PACEMAKER IMPLANT N/A 05/20/2016   Procedure: Pacemaker Implant;  Surgeon: Will Gladis Norton, MD;  Location: [facility];  Service: Cardiovascular;  Laterality: N/A;   TONSILLECTOMY     TOTAL HIP ARTHROPLASTY Right 01/30/2015   Procedure: RIGHT TOTAL HIP ARTHROPLASTY ANTERIOR APPROACH AND REMOVAL LIPOMA RIGHT HIP;  Surgeon: Lonni CINDERELLA Poli, MD;  Location: [facility];  Service: Orthopedics;  Laterality: Right;   UPPER GI ENDOSCOPY     Patient Active Problem List   Diagnosis Date Noted   Back pain 12/26/2022   Fluid retention in legs 11/13/2022   Stress 06/14/2022   Obesity (HCC)- Start BMI 40.06 05/12/2022   OSA (obstructive sleep apnea) 05/03/2022   Hyperglycemia 03/15/2022   Heart block AV complete (HCC) 01/05/2022   Pulmonary hypertension (HCC) 11/19/2021   Chronic diastolic (congestive) heart failure (HCC) 06/01/2020   Pacemaker S/P St Jude Medical Assurity MRI model [id number] 05/20/2016   History of total right hip arthroplasty 01/30/2015   Arthritis 09/11/2014   Renal cyst 09/04/2014   Allergic rhinitis due to pollen 09/04/2014   Essential hypertension 07/08/2010   GERD (gastroesophageal reflux disease) 07/08/2010  Calcium oxalate renal stones 07/08/2010   Hyperlipidemia 07/08/2010    PCP: Norleen Jobs  REFERRING PROVIDER: Lonni Poli  REFERRING DIAG:  [phone] (ICD-10-CM) - History of total right hip arthroplasty  M54.41,G89.29 (ICD-10-CM) - Chronic right-sided low back pain with right-sided sciatica    Rationale for Evaluation and Treatment: Rehabilitation  THERAPY DIAG:  Radiculopathy, lumbosacral region  Bilateral hip pain  Muscle weakness (generalized)  ONSET DATE: 08/16/23 referral  SUBJECTIVE:                                                                                                                                                                                           SUBJECTIVE STATEMENT:   Having a lot more  pain today, have been trying to do low impact exercise classes but I've gotten to the point where I can't even do these things. I went to the grocery store and shopped and then went some place else after PT last time and then went to bed, that was a mistake. I've had arthritis in my hip since I was 23.    EVAL: I started getting pain, and I thought it was my replacement. The pain was shooting from my groin and across the thigh. Now I have pain on both sides. I almost fell at the grocery store reaching down to grab something. It felt like electric shock down my leg. Stairs are hard to do because it is painful.   PERTINENT HISTORY:  Patient is a 78 year old female who is almost 9 years out from a right total hip arthroplasty.  She comes in today ambulating with a cane.  She has been having a hard time getting up from a seated position and up from the floor when she is down on the floor.  She reports sciatic pain on the right side and also some left lower extremity and right lower extremity radicular types of symptoms.  She is not a diabetic.  She takes anti-inflammatories over-the-counter on a rare basis.  She denies any change in bowel or bladder function.   On exam both hips move smoothly and fluidly with no blocks or rotation.  Most of her pain seems to be with positive straight leg raise on both sides more so on the right than the left but she does have left lower extremity radicular symptoms as well.  PAIN:  Are you having pain? Yes: NPRS scale: 4/10 Pain location: both hips, into legs, some pain in low back Pain description: becoming more frequent and intense, electrical shock, achy  Aggravating factors: bending, standing too long Relieving factors: nothing really  PRECAUTIONS: None  RED FLAGS: None  WEIGHT BEARING RESTRICTIONS: No  FALLS:  Has patient fallen in last 6 months? No  LIVING ENVIRONMENT: Lives with: lives with their spouse Lives in: House/apartment Stairs: Yes:  External: 5 steps; bilateral but cannot reach both Has following equipment at home: Single point cane and Walker - 2 wheeled  PLOF: Independent and Independent with basic ADLs  PATIENT GOALS: to not hurt and I don't want to risk falling  NEXT MD VISIT: 09/20/23  OBJECTIVE:  Note: Objective measures were completed at Evaluation unless otherwise noted.  DIAGNOSTIC FINDINGS:  2 views of the lumbar spine show a significant grade 1 spondylolisthesis at L4-L5 with some degenerative changes as well. An AP pelvis and lateral of the right hip shows a well-seated right total hip arthroplasty. There is moderate arthritis of the left hip.    COGNITION: Overall cognitive status: Within functional limits for tasks assessed     SENSATION: WFL   POSTURE: rounded shoulders and forward head  PALPATION: Some tenderness in L2-L5  LUMBAR ROM:   AROM eval  Flexion Can touch toes, painful coming back up  Extension 25% with pain  Right lateral flexion Mid thigh with pain  Left lateral flexion Fibular head no pain  Right rotation 25%  Left rotation 50%   (Blank rows = not tested)  LOWER EXTREMITY ROM:   grossly within functional ranges   LOWER EXTREMITY MMT:  grossly 5/5, some pain with resisted knee extension on RLE, hip extension 4/5  LUMBAR SPECIAL TESTS:  Straight leg raise test: Positive and FABER test: Positive  FUNCTIONAL TESTS:  5 times sit to stand: 10.39s Timed up and go (TUG): 15.53s w/o cane  GAIT: Distance walked: in clinic distances Assistive device utilized: Single point cane Level of assistance: Complete Independence  TREATMENT DATE:  09/21/23  Declined Nustep- last time my pacemaker was really revving up and I was very concerned and surprised, I don't need that stress today   Spent some time discussing and educating on prior imaging findings (spondylolisthesis, hip OA, etc) and how it is possibly affecting (or not affecting) current pain levels   Supine hip  flexor stretch 2x30 seconds B Supine frog stretch 2x30 seconds Piriformis stretch 2x30 seconds B  HS/gastroc stretch 2x30 seconds B  Seated TA set 15x3 seconds Seated TA set + mini-march x12 Seated TA set + opposite UE/LE flexion x10B STS x10 no UEs cues for eccentric lower      09/14/23 Nustep L 5 LE only at 4 1/2 min decreased resistance to 3 Black tband trunk ext 2 sets 10 ADD ball squeeze 2 sets 10 Clams red tband 2 sets 10  Red tband hip flex 2 sets 10 Iso abdominals 2 set s 10 Wt ball chest press then obl 10 x each seated 3# alt LAQ 20 x 3 # hip abd with flexion 10 x BIL 2 sets  Standing 3# hip ext and abd 10 x Legs on ball bridge 2 sets 10 Legs on ball obl 20 x     09/05/23 EVAL  PATIENT EDUCATION:  Education details: POC, HEP, sciatica, hip flexor tightness sleeping in recliners Person educated: Patient Education method: Medical illustrator Education comprehension: verbalized understanding and returned demonstration  HOME EXERCISE PROGRAM: Access Code: [email or web address] URL: [email or web address] Date: 09/05/2023 Prepared by: Almetta Fam  Exercises - Supine Lower Trunk Rotation  - 1 x daily - 7 x weekly - 2 sets - 10 reps - Supine Bridge  - 1 x daily - 7 x weekly - 2 sets - 10 reps - Supine Figure 4 Piriformis Stretch  - 1 x daily - 7 x weekly - 2 reps - 15 hold - Modified Thomas Stretch  - 1 x daily - 7 x weekly - 2 reps - 15 hold  ASSESSMENT:  CLINICAL IMPRESSION:   Very pleasant but very self-limiting and anxious about activity, declined Nustep. PT needed to redirect from multiple lines of catastrophic thoughts/assumptions related to pain. Worked on hip mobility/flexibilty today in supine today, then core/proximal strength in sitting. Very anxious about progress with PT even tho it has only been a couple of tx  sessions, assured her that meaningful gains and progress in PT does take time especially in context of chronic pain. Pain to 2/10 at EOS.    Patient is a 78 y.o. female who was seen today for physical therapy evaluation and treatment for LBP and hip pain. She started using her cane again a few months ago when her pain started becoming more of an issue. She states she mostly takes it outside the house, especially when she knows she has to walk a distance. Patient reports sleeping in the recliner since her hip surgery because pain in the knees and hip and difficulty getting out of bed. She would like to be able to sleep in her bed again at least for a few hours at a time. Her images show grade 1 spondylolisthesis at L4-L5 with some degenerative changes as well. Patient reports some issues with depth perception so she has to be careful with steps, curbs, and stairs. Patient will benefit from skilled PT to address her LBP and hip pain to increase her activity tolerance.  OBJECTIVE IMPAIRMENTS: Abnormal gait, decreased activity tolerance, decreased balance, decreased ROM, decreased strength, and pain.   ACTIVITY LIMITATIONS: bending, squatting, stairs, and locomotion level  PARTICIPATION LIMITATIONS: cleaning, laundry, driving, shopping, community activity, and yard work  PERSONAL FACTORS: Age, Past/current experiences, Time since onset of injury/illness/exacerbation, and 1-2 comorbidities: arthritis, hx of surgery are also affecting patient's functional outcome.   REHAB POTENTIAL: Good  CLINICAL DECISION MAKING: Stable/uncomplicated  EVALUATION COMPLEXITY: Low  GOALS: Goals reviewed with patient? Yes  SHORT TERM GOALS: Target date: 10/10/23  Patient will be independent with initial HEP.  Baseline:  Goal status: 09/14/23 progressing  2.  Patient will report centralization of radicular symptoms.  Baseline: pain in both legs Goal status: INITIAL   LONG TERM GOALS: Target date:  11/14/23  Patient will be independent with advanced/ongoing HEP to improve outcomes and carryover.  Baseline:  Goal status: INITIAL  2.  Patient will report 50-75% improvement in low back pain to improve QOL.  Baseline: 5/10 Goal status: INITIAL  3.  Patient will demonstrate full pain free lumbar ROM to perform ADLs.   Baseline: see chart Goal status: INITIAL  4.  Patient will be up and down a set of stairs without pain.  Baseline: pain going up and down Goal status: INITIAL  5.  Patient will be able to sleep in her bed again for at  least 3-4 hrs.  Baseline: 1 hr tolerance  Goal status: INITIAL   6.  Patient will tolerate 30 min of standing to perform ADLs and household chores Baseline: ~10 mins Goal status: INITIAL  PLAN:  PT FREQUENCY: 2x/week  PT DURATION: 10 weeks  PLANNED INTERVENTIONS: 97110-Therapeutic exercises, 97530- Therapeutic activity, 97112- Neuromuscular re-education, 97535- Self Care, [id number]- Manual therapy, [phone]- Gait training, [phone]- Electrical stimulation (unattended), [id number]- Vasopneumatic device, [id number]- Traction (mechanical), 20560 (1-2 muscles), 20561 (3+ muscles)- Dry Needling, Patient/Family education, Balance training, Stair training, Taping, Joint mobilization, Spinal mobilization, Cryotherapy, and Moist heat.  PLAN FOR NEXT SESSION: LBP stretches and mobility to tolerance, does not tolerate laying on her back for too long, hip strengthening, prefers to avoid Nustep due to her concerns about her pacemaker   Josette Rough, PT, DPT 09/21/23 10:14 AM

## 2023-09-22 NOTE — Therapy (Signed)
 OUTPATIENT PHYSICAL THERAPY THORACOLUMBAR TREATMENT    Patient Name: Margaret Reese MRN: [id number] DOB:11/01/1945, 78 y.o., female Today's Date: 09/25/2023  END OF SESSION:  PT End of Session - 09/25/23 1428     Visit Number 4    Date for PT Re-Evaluation 11/14/23    Authorization Type Medicare    PT Start Time 1430    PT Stop Time 1515    PT Time Calculation (min) 45 min    Activity Tolerance Patient tolerated treatment well    Behavior During Therapy [facility] for tasks assessed/performed            Past Medical History:  Diagnosis Date   Allergy    Anxiety    Arthritis    Asthma    Complication of anesthesia    pt states has difficulty awakening; also has increased sinus drainage   Constipation    Edema, lower extremity    Encounter for interrogation of cardiac pacemaker 09/05/2018   Falls    GERD (gastroesophageal reflux disease)    H/O back injury    History of bronchitis    History of colon polyps    Hypertension    Imbalance    Kidney cysts    pt states not sure which kidney does see kidney specialist yearly pt states every thing okay currently    Legally blind in right eye, as defined in [place]     Mobitz type II atrioventricular block 11/08/2016   Multiple gastric ulcers    Obesity    OSA (obstructive sleep apnea) 05/03/2022   Pacemaker S/P [employer] Jude [employer]urity MRI model [id number] 05/20/2016   Renal stone    Retinal vein occlusion    Shortness of breath    Sinus node dysfunction (HCC) 12/16/2018   Tingling    left arm    Trace cataracts    Urinary frequency    Vitamin D  deficiency    Past Surgical History:  Procedure Laterality Date   CARDIOVASCULAR STRESS TEST  dec 2016   CHOLECYSTECTOMY     COLONOSCOPY  2011   EYE SURGERY Bilateral    Cataract surgery.    LEFT HEART CATH AND CORONARY ANGIOGRAPHY N/A 05/19/2016   Procedure: Left Heart Cath and Coronary Angiography;  Surgeon: Salena Negri, MD;  Location: [facility];  Service: Cardiovascular;   Laterality: N/A;   PACEMAKER IMPLANT N/A 05/20/2016   Procedure: Pacemaker Implant;  Surgeon: Will Gladis Norton, MD;  Location: [facility];  Service: Cardiovascular;  Laterality: N/A;   TONSILLECTOMY     TOTAL HIP ARTHROPLASTY Right 01/30/2015   Procedure: RIGHT TOTAL HIP ARTHROPLASTY ANTERIOR APPROACH AND REMOVAL LIPOMA RIGHT HIP;  Surgeon: Lonni CINDERELLA Poli, MD;  Location: [facility];  Service: Orthopedics;  Laterality: Right;   UPPER GI ENDOSCOPY     Patient Active Problem List   Diagnosis Date Noted   Back pain 12/26/2022   Fluid retention in legs 11/13/2022   Stress 06/14/2022   Obesity (HCC)- Start BMI 40.06 05/12/2022   OSA (obstructive sleep apnea) 05/03/2022   Hyperglycemia 03/15/2022   Heart block AV complete (HCC) 01/05/2022   Pulmonary hypertension (HCC) 11/19/2021   Chronic diastolic (congestive) heart failure (HCC) 06/01/2020   Pacemaker S/P St Jude Medical Assurity MRI model [id number] 05/20/2016   History of total right hip arthroplasty 01/30/2015   Arthritis 09/11/2014   Renal cyst 09/04/2014   Allergic rhinitis due to pollen 09/04/2014   Essential hypertension 07/08/2010   GERD (gastroesophageal reflux disease) 07/08/2010  Calcium oxalate renal stones 07/08/2010   Hyperlipidemia 07/08/2010    PCP: Norleen Jobs  REFERRING PROVIDER: Lonni Poli  REFERRING DIAG:  [phone] (ICD-10-CM) - History of total right hip arthroplasty  M54.41,G89.29 (ICD-10-CM) - Chronic right-sided low back pain with right-sided sciatica    Rationale for Evaluation and Treatment: Rehabilitation  THERAPY DIAG:  Radiculopathy, lumbosacral region  Bilateral hip pain  Muscle weakness (generalized)  ONSET DATE: 08/16/23 referral  SUBJECTIVE:                                                                                                                                                                                           SUBJECTIVE STATEMENT:   Fair, I was a  little bit better after last session have been able to sleep about 5 times now. Getting up from the bed is still hard.    EVAL: I started getting pain, and I thought it was my replacement. The pain was shooting from my groin and across the thigh. Now I have pain on both sides. I almost fell at the grocery store reaching down to grab something. It felt like electric shock down my leg. Stairs are hard to do because it is painful.   PERTINENT HISTORY:  Patient is a 79 year old female who is almost 9 years out from a right total hip arthroplasty.  She comes in today ambulating with a cane.  She has been having a hard time getting up from a seated position and up from the floor when she is down on the floor.  She reports sciatic pain on the right side and also some left lower extremity and right lower extremity radicular types of symptoms.  She is not a diabetic.  She takes anti-inflammatories over-the-counter on a rare basis.  She denies any change in bowel or bladder function.   On exam both hips move smoothly and fluidly with no blocks or rotation.  Most of her pain seems to be with positive straight leg raise on both sides more so on the right than the left but she does have left lower extremity radicular symptoms as well.  PAIN:  Are you having pain? Yes: NPRS scale: 4/10 Pain location: both hips, into legs, some pain in low back Pain description: becoming more frequent and intense, electrical shock, achy  Aggravating factors: bending, standing too long Relieving factors: nothing really  PRECAUTIONS: None  RED FLAGS: None   WEIGHT BEARING RESTRICTIONS: No  FALLS:  Has patient fallen in last 6 months? No  LIVING ENVIRONMENT: Lives with: lives with their spouse Lives in: House/apartment Stairs: Yes: External: 5 steps; bilateral but cannot reach both  Has following equipment at home: Single point cane and Walker - 2 wheeled  PLOF: Independent and Independent with basic ADLs  PATIENT  GOALS: to not hurt and I don't want to risk falling  NEXT MD VISIT: 09/20/23  OBJECTIVE:  Note: Objective measures were completed at Evaluation unless otherwise noted.  DIAGNOSTIC FINDINGS:  2 views of the lumbar spine show a significant grade 1 spondylolisthesis at L4-L5 with some degenerative changes as well. An AP pelvis and lateral of the right hip shows a well-seated right total hip arthroplasty. There is moderate arthritis of the left hip.    COGNITION: Overall cognitive status: Within functional limits for tasks assessed     SENSATION: WFL   POSTURE: rounded shoulders and forward head  PALPATION: Some tenderness in L2-L5  LUMBAR ROM:   AROM eval  Flexion Can touch toes, painful coming back up  Extension 25% with pain  Right lateral flexion Mid thigh with pain  Left lateral flexion Fibular head no pain  Right rotation 25%  Left rotation 50%   (Blank rows = not tested)  LOWER EXTREMITY ROM:   grossly within functional ranges   LOWER EXTREMITY MMT:  grossly 5/5, some pain with resisted knee extension on RLE, hip extension 4/5  LUMBAR SPECIAL TESTS:  Straight leg raise test: Positive and FABER test: Positive  FUNCTIONAL TESTS:  5 times sit to stand: 10.39s Timed up and go (TUG): 15.53s w/o cane  GAIT: Distance walked: in clinic distances Assistive device utilized: Single point cane Level of assistance: Complete Independence  TREATMENT DATE: 09/25/23 Walking 2 laps with SPC   Supine LTR, HS stretch, SKTC Feet on pball rotations and knees to chest  Ball squeeze 2x10 Bridges 2x10 STS 2x10 Calf stretch  Standing rows and ext red band 2x10  09/21/23  Declined Nustep- last time my pacemaker was really revving up and I was very concerned and surprised, I don't need that stress today   Spent some time discussing and educating on prior imaging findings (spondylolisthesis, hip OA, etc) and how it is possibly affecting (or not affecting) current pain levels    Supine hip flexor stretch 2x30 seconds B Supine frog stretch 2x30 seconds Piriformis stretch 2x30 seconds B  HS/gastroc stretch 2x30 seconds B  Seated TA set 15x3 seconds Seated TA set + mini-march x12 Seated TA set + opposite UE/LE flexion x10B STS x10 no UEs cues for eccentric lower     09/14/23 Nustep L 5 LE only at 4 1/2 min decreased resistance to 3 Black tband trunk ext 2 sets 10 ADD ball squeeze 2 sets 10 Clams red tband 2 sets 10  Red tband hip flex 2 sets 10 Iso abdominals 2 set s 10 Wt ball chest press then obl 10 x each seated 3# alt LAQ 20 x 3 # hip abd with flexion 10 x BIL 2 sets  Standing 3# hip ext and abd 10 x Legs on ball bridge 2 sets 10 Legs on ball obl 20 x     09/05/23 EVAL  PATIENT EDUCATION:  Education details: POC, HEP, sciatica, hip flexor tightness sleeping in recliners Person educated: Patient Education method: Medical illustrator Education comprehension: verbalized understanding and returned demonstration  HOME EXERCISE PROGRAM: Access Code: [email or web address] URL: [email or web address] Date: 09/05/2023 Prepared by: Almetta Fam  Exercises - Supine Lower Trunk Rotation  - 1 x daily - 7 x weekly - 2 sets - 10 reps - Supine Bridge  - 1 x daily - 7 x weekly - 2 sets - 10 reps - Supine Figure 4 Piriformis Stretch  - 1 x daily - 7 x weekly - 2 reps - 15 hold - Modified Thomas Stretch  - 1 x daily - 7 x weekly - 2 reps - 15 hold  ASSESSMENT:  CLINICAL IMPRESSION:  Patient continues to have pain in hips. Reports doing exercises at home. Worked on hip mobility/flexibilty today in supine today. States rotations and knees to chest on ball felt good. Pt reports the leg is not catching as much and she has been able to sleep in her bed some nights but getting up can be a struggle at times.    Patient is  a 78 y.o. female who was seen today for physical therapy evaluation and treatment for LBP and hip pain. She started using her cane again a few months ago when her pain started becoming more of an issue. She states she mostly takes it outside the house, especially when she knows she has to walk a distance. Patient reports sleeping in the recliner since her hip surgery because pain in the knees and hip and difficulty getting out of bed. She would like to be able to sleep in her bed again at least for a few hours at a time. Her images show grade 1 spondylolisthesis at L4-L5 with some degenerative changes as well. Patient reports some issues with depth perception so she has to be careful with steps, curbs, and stairs. Patient will benefit from skilled PT to address her LBP and hip pain to increase her activity tolerance.  OBJECTIVE IMPAIRMENTS: Abnormal gait, decreased activity tolerance, decreased balance, decreased ROM, decreased strength, and pain.   ACTIVITY LIMITATIONS: bending, squatting, stairs, and locomotion level  PARTICIPATION LIMITATIONS: cleaning, laundry, driving, shopping, community activity, and yard work  PERSONAL FACTORS: Age, Past/current experiences, Time since onset of injury/illness/exacerbation, and 1-2 comorbidities: arthritis, hx of surgery are also affecting patient's functional outcome.   REHAB POTENTIAL: Good  CLINICAL DECISION MAKING: Stable/uncomplicated  EVALUATION COMPLEXITY: Low  GOALS: Goals reviewed with patient? Yes  SHORT TERM GOALS: Target date: 10/10/23  Patient will be independent with initial HEP.  Baseline:  Goal status: 09/14/23 progressing  2.  Patient will report centralization of radicular symptoms.  Baseline: pain in both legs Goal status: INITIAL   LONG TERM GOALS: Target date: 11/14/23  Patient will be independent with advanced/ongoing HEP to improve outcomes and carryover.  Baseline:  Goal status: INITIAL  2.  Patient will report 50-75%  improvement in low back pain to improve QOL.  Baseline: 5/10 Goal status: INITIAL  3.  Patient will demonstrate full pain free lumbar ROM to perform ADLs.   Baseline: see chart Goal status: INITIAL  4.  Patient will be up and down a set of stairs without pain.  Baseline: pain going up and down Goal status: INITIAL  5.  Patient will be able to sleep in her bed again for at least 3-4 hrs.  Baseline: 1 hr tolerance  Goal status: INITIAL   6.  Patient will  tolerate 30 min of standing to perform ADLs and household chores Baseline: ~10 mins Goal status: INITIAL  PLAN:  PT FREQUENCY: 2x/week  PT DURATION: 10 weeks  PLANNED INTERVENTIONS: 97110-Therapeutic exercises, 97530- Therapeutic activity, 97112- Neuromuscular re-education, 97535- Self Care, [id number]- Manual therapy, [phone]- Gait training, [phone]- Electrical stimulation (unattended), [id number]- Vasopneumatic device, [id number]- Traction (mechanical), 20560 (1-2 muscles), 20561 (3+ muscles)- Dry Needling, Patient/Family education, Balance training, Stair training, Taping, Joint mobilization, Spinal mobilization, Cryotherapy, and Moist heat.  PLAN FOR NEXT SESSION: LBP stretches and mobility to tolerance, does not tolerate laying on her back for too long, hip strengthening, prefers to avoid Nustep due to her concerns about her pacemaker   Almetta Fam PT, DPT 09/25/23 3:11 PM

## 2023-09-25 ENCOUNTER — Ambulatory Visit: Attending: Orthopaedic Surgery

## 2023-09-25 DIAGNOSIS — M25551 Pain in right hip: Secondary | ICD-10-CM | POA: Insufficient documentation

## 2023-09-25 DIAGNOSIS — M6281 Muscle weakness (generalized): Secondary | ICD-10-CM | POA: Diagnosis not present

## 2023-09-25 DIAGNOSIS — M5417 Radiculopathy, lumbosacral region: Secondary | ICD-10-CM | POA: Diagnosis not present

## 2023-09-25 DIAGNOSIS — M25552 Pain in left hip: Secondary | ICD-10-CM | POA: Insufficient documentation

## 2023-09-28 ENCOUNTER — Ambulatory Visit: Admitting: Physical Therapy

## 2023-09-28 DIAGNOSIS — M6281 Muscle weakness (generalized): Secondary | ICD-10-CM

## 2023-09-28 DIAGNOSIS — M25551 Pain in right hip: Secondary | ICD-10-CM | POA: Diagnosis not present

## 2023-09-28 DIAGNOSIS — M5417 Radiculopathy, lumbosacral region: Secondary | ICD-10-CM | POA: Diagnosis not present

## 2023-09-28 DIAGNOSIS — M25552 Pain in left hip: Secondary | ICD-10-CM

## 2023-09-28 NOTE — Therapy (Signed)
 OUTPATIENT PHYSICAL THERAPY THORACOLUMBAR TREATMENT    Patient Name: Margaret Reese MRN: [id number] DOB:August 10, 1945, 78 y.o., female Today's Date: 09/28/2023  END OF SESSION:  PT End of Session - 09/28/23 0923     Visit Number 5    Date for PT Re-Evaluation 11/14/23    Authorization Type Medicare    PT Start Time 0925    PT Stop Time 1010    PT Time Calculation (min) 45 min            Past Medical History:  Diagnosis Date   Allergy    Anxiety    Arthritis    Asthma    Complication of anesthesia    pt states has difficulty awakening; also has increased sinus drainage   Constipation    Edema, lower extremity    Encounter for interrogation of cardiac pacemaker 09/05/2018   Falls    GERD (gastroesophageal reflux disease)    H/O back injury    History of bronchitis    History of colon polyps    Hypertension    Imbalance    Kidney cysts    pt states not sure which kidney does see kidney specialist yearly pt states every thing okay currently    Legally blind in right eye, as defined in [place]     Mobitz type II atrioventricular block 11/08/2016   Multiple gastric ulcers    Obesity    OSA (obstructive sleep apnea) 05/03/2022   Pacemaker S/P St Jude Medical Assurity MRI model [id number] 05/20/2016   Renal stone    Retinal vein occlusion    Shortness of breath    Sinus node dysfunction (HCC) 12/16/2018   Tingling    left arm    Trace cataracts    Urinary frequency    Vitamin D  deficiency    Past Surgical History:  Procedure Laterality Date   CARDIOVASCULAR STRESS TEST  dec 2016   CHOLECYSTECTOMY     COLONOSCOPY  2011   EYE SURGERY Bilateral    Cataract surgery.    LEFT HEART CATH AND CORONARY ANGIOGRAPHY N/A 05/19/2016   Procedure: Left Heart Cath and Coronary Angiography;  Surgeon: Salena Negri, MD;  Location: [facility];  Service: Cardiovascular;  Laterality: N/A;   PACEMAKER IMPLANT N/A 05/20/2016   Procedure: Pacemaker Implant;  Surgeon: Will Gladis Norton,  MD;  Location: [facility];  Service: Cardiovascular;  Laterality: N/A;   TONSILLECTOMY     TOTAL HIP ARTHROPLASTY Right 01/30/2015   Procedure: RIGHT TOTAL HIP ARTHROPLASTY ANTERIOR APPROACH AND REMOVAL LIPOMA RIGHT HIP;  Surgeon: Lonni CINDERELLA Poli, MD;  Location: [facility];  Service: Orthopedics;  Laterality: Right;   UPPER GI ENDOSCOPY     Patient Active Problem List   Diagnosis Date Noted   Back pain 12/26/2022   Fluid retention in legs 11/13/2022   Stress 06/14/2022   Obesity (HCC)- Start BMI 40.06 05/12/2022   OSA (obstructive sleep apnea) 05/03/2022   Hyperglycemia 03/15/2022   Heart block AV complete (HCC) 01/05/2022   Pulmonary hypertension (HCC) 11/19/2021   Chronic diastolic (congestive) heart failure (HCC) 06/01/2020   Pacemaker S/P St Jude Medical Assurity MRI model [id number] 05/20/2016   History of total right hip arthroplasty 01/30/2015   Arthritis 09/11/2014   Renal cyst 09/04/2014   Allergic rhinitis due to pollen 09/04/2014   Essential hypertension 07/08/2010   GERD (gastroesophageal reflux disease) 07/08/2010   Calcium oxalate renal stones 07/08/2010   Hyperlipidemia 07/08/2010    PCP: Norleen Jobs  REFERRING  PROVIDER: Lonni Poli  REFERRING DIAG:  [phone] (ICD-10-CM) - History of total right hip arthroplasty  M54.41,G89.29 (ICD-10-CM) - Chronic right-sided low back pain with right-sided sciatica    Rationale for Evaluation and Treatment: Rehabilitation  THERAPY DIAG:  Radiculopathy, lumbosacral region  Bilateral hip pain  Muscle weakness (generalized)  ONSET DATE: 08/16/23 referral  SUBJECTIVE:                                                                                                                                                                                           SUBJECTIVE STATEMENT:   Did great last session but today is awful . No changes with radiating pain, back hurts I think form using more. Left knee main issue.  RT groin pull with getting in/out car and steps  EVAL: I started getting pain, and I thought it was my replacement. The pain was shooting from my groin and across the thigh. Now I have pain on both sides. I almost fell at the grocery store reaching down to grab something. It felt like electric shock down my leg. Stairs are hard to do because it is painful.   PERTINENT HISTORY:  Patient is a 78 year old female who is almost 9 years out from a right total hip arthroplasty.  She comes in today ambulating with a cane.  She has been having a hard time getting up from a seated position and up from the floor when she is down on the floor.  She reports sciatic pain on the right side and also some left lower extremity and right lower extremity radicular types of symptoms.  She is not a diabetic.  She takes anti-inflammatories over-the-counter on a rare basis.  She denies any change in bowel or bladder function.   On exam both hips move smoothly and fluidly with no blocks or rotation.  Most of her pain seems to be with positive straight leg raise on both sides more so on the right than the left but she does have left lower extremity radicular symptoms as well.  PAIN:  Are you having pain? Yes: NPRS scale: 4/10 Pain location: both hips, into legs, some pain in low back Pain description: becoming more frequent and intense, electrical shock, achy  Aggravating factors: bending, standing too long Relieving factors: nothing really  PRECAUTIONS: None  RED FLAGS: None   WEIGHT BEARING RESTRICTIONS: No  FALLS:  Has patient fallen in last 6 months? No  LIVING ENVIRONMENT: Lives with: lives with their spouse Lives in: House/apartment Stairs: Yes: External: 5 steps; bilateral but cannot reach both Has following equipment at home: Single point cane and Walker - 2  wheeled  PLOF: Independent and Independent with basic ADLs  PATIENT GOALS: to not hurt and I don't want to risk falling  NEXT MD VISIT:  09/20/23  OBJECTIVE:  Note: Objective measures were completed at Evaluation unless otherwise noted.  DIAGNOSTIC FINDINGS:  2 views of the lumbar spine show a significant grade 1 spondylolisthesis at L4-L5 with some degenerative changes as well. An AP pelvis and lateral of the right hip shows a well-seated right total hip arthroplasty. There is moderate arthritis of the left hip.    COGNITION: Overall cognitive status: Within functional limits for tasks assessed     SENSATION: WFL   POSTURE: rounded shoulders and forward head  PALPATION: Some tenderness in L2-L5  LUMBAR ROM:   AROM eval 09/28/23  Flexion Can touch toes, painful coming back up [facility]  Extension 25% with pain 25# with pain  Right lateral flexion Mid thigh with pain Limited 25%  Left lateral flexion Fibular head no pain Limited 25%  Right rotation 25% WFL  Left rotation 50% WFL   (Blank rows = not tested)  LOWER EXTREMITY ROM:   grossly within functional ranges   LOWER EXTREMITY MMT:  grossly 5/5, some pain with resisted knee extension on RLE, hip extension 4/5  LUMBAR SPECIAL TESTS:  Straight leg raise test: Positive and FABER test: Positive  FUNCTIONAL TESTS:  5 times sit to stand: 10.39s Timed up and go (TUG): 15.53s w/o cane  GAIT: Distance walked: in clinic distances Assistive device utilized: Single point cane Level of assistance: Complete Independence  TREATMENT DATE:  09/28/23  Feet on ball bridge,KTC and obl- left knee catch with KTC, cuing with rotation to not twist so much Isometric abdominals 3 sec hold PROM LE and trunk  Left ITB stretching and STW- very tender. Instructed in rolling at home and stretching SL hip 3 way 10 x BIL In // bars on foam roll 3 x  laterally ADD ball squeeze 15 x Clams 2 sets 10 STS with wt ball OH 10 x    09/25/23 Walking 2 laps with SPC   Supine LTR, HS stretch, SKTC Feet on pball rotations and knees to chest  Ball squeeze 2x10 Bridges 2x10 STS  2x10 Calf stretch  Standing rows and ext red band 2x10  09/21/23  Declined Nustep- last time my pacemaker was really revving up and I was very concerned and surprised, I don't need that stress today   Spent some time discussing and educating on prior imaging findings (spondylolisthesis, hip OA, etc) and how it is possibly affecting (or not affecting) current pain levels   Supine hip flexor stretch 2x30 seconds B Supine frog stretch 2x30 seconds Piriformis stretch 2x30 seconds B  HS/gastroc stretch 2x30 seconds B  Seated TA set 15x3 seconds Seated TA set + mini-march x12 Seated TA set + opposite UE/LE flexion x10B STS x10 no UEs cues for eccentric lower     09/14/23 Nustep L 5 LE only at 4 1/2 min decreased resistance to 3 Black tband trunk ext 2 sets 10 ADD ball squeeze 2 sets 10 Clams red tband 2 sets 10  Red tband hip flex 2 sets 10 Iso abdominals 2 set s 10 Wt ball chest press then obl 10 x each seated 3# alt LAQ 20 x 3 # hip abd with flexion 10 x BIL 2 sets  Standing 3# hip ext and abd 10 x Legs on ball bridge 2 sets 10 Legs on ball obl 20 x  09/05/23 EVAL                                                                                                                                PATIENT EDUCATION:  Education details: POC, HEP, sciatica, hip flexor tightness sleeping in recliners Person educated: Patient Education method: Medical illustrator Education comprehension: verbalized understanding and returned demonstration  HOME EXERCISE PROGRAM: Access Code: [email or web address] URL: [email or web address] Date: 09/05/2023 Prepared by: Almetta Fam  Exercises - Supine Lower Trunk Rotation  - 1 x daily - 7 x weekly - 2 sets - 10 reps - Supine Bridge  - 1 x daily - 7 x weekly - 2 sets - 10 reps - Supine Figure 4 Piriformis Stretch  - 1 x daily - 7 x weekly - 2 reps - 15 hold - Modified Thomas Stretch  - 1 x daily - 7 x weekly - 2 reps - 15  hold  ASSESSMENT:  CLINICAL IMPRESSION:  Pt states no changes in leg pain esp Left knee and RT groin. Pt states no back pain at start of therapy but now back pain probably from increased mvmt Pt ordered heel lift as she has known leg difference and I told her I thought this would be beneficial as ITB on left side is very flared up- educ in STW with rolling pin, stretching EOB and heat and ice. Pt struggled with ex today with catching in left leg. Goals assessed and documented.  Patient is a 78 y.o. female who was seen today for physical therapy evaluation and treatment for LBP and hip pain. She started using her cane again a few months ago when her pain started becoming more of an issue. She states she mostly takes it outside the house, especially when she knows she has to walk a distance. Patient reports sleeping in the recliner since her hip surgery because pain in the knees and hip and difficulty getting out of bed. She would like to be able to sleep in her bed again at least for a few hours at a time. Her images show grade 1 spondylolisthesis at L4-L5 with some degenerative changes as well. Patient reports some issues with depth perception so she has to be careful with steps, curbs, and stairs. Patient will benefit from skilled PT to address her LBP and hip pain to increase her activity tolerance.  OBJECTIVE IMPAIRMENTS: Abnormal gait, decreased activity tolerance, decreased balance, decreased ROM, decreased strength, and pain.   ACTIVITY LIMITATIONS: bending, squatting, stairs, and locomotion level  PARTICIPATION LIMITATIONS: cleaning, laundry, driving, shopping, community activity, and yard work  PERSONAL FACTORS: Age, Past/current experiences, Time since onset of injury/illness/exacerbation, and 1-2 comorbidities: arthritis, hx of surgery are also affecting patient's functional outcome.   REHAB POTENTIAL: Good  CLINICAL DECISION MAKING: Stable/uncomplicated  EVALUATION COMPLEXITY:  Low  GOALS: Goals reviewed with patient? Yes  SHORT TERM GOALS: Target date: 10/10/23  Patient will be independent with initial HEP.  Baseline:  Goal status: 09/14/23 progressing  09/28/23 MET  2.  Patient will report centralization of radicular symptoms.  Baseline: pain in both legs Goal status: 09/28/23 no changes   LONG TERM GOALS: Target date: 11/14/23  Patient will be independent with advanced/ongoing HEP to improve outcomes and carryover.  Baseline:  Goal status: INITIAL  2.  Patient will report 50-75% improvement in low back pain to improve QOL.  Baseline: 5/10 Goal status: INITIAL  3.  Patient will demonstrate full pain free lumbar ROM to perform ADLs.   Baseline: see chart Goal status: INITIAL  4.  Patient will be up and down a set of stairs without pain.  Baseline: pain going up and down Goal status: INITIAL  5.  Patient will be able to sleep in her bed again for at least 3-4 hrs.  Baseline: 1 hr tolerance  Goal status: INITIAL   6.  Patient will tolerate 30 min of standing to perform ADLs and household chores Baseline: ~10 mins Goal status: INITIAL  PLAN:  PT FREQUENCY: 2x/week  PT DURATION: 10 weeks  PLANNED INTERVENTIONS: 97110-Therapeutic exercises, 97530- Therapeutic activity, 97112- Neuromuscular re-education, 97535- Self Care, [id number]- Manual therapy, [phone]- Gait training, [phone]- Electrical stimulation (unattended), [id number]- Vasopneumatic device, [id number]- Traction (mechanical), 20560 (1-2 muscles), 20561 (3+ muscles)- Dry Needling, Patient/Family education, Balance training, Stair training, Taping, Joint mobilization, Spinal mobilization, Cryotherapy, and Moist heat.  PLAN FOR NEXT SESSION: assess heel lift. Assess ITB Jon Or PTA 09/28/23 9:23 AM

## 2023-10-03 ENCOUNTER — Ambulatory Visit: Admitting: Physical Therapy

## 2023-10-03 ENCOUNTER — Telehealth (INDEPENDENT_AMBULATORY_CARE_PROVIDER_SITE_OTHER): Admitting: Psychology

## 2023-10-03 DIAGNOSIS — M25552 Pain in left hip: Secondary | ICD-10-CM | POA: Diagnosis not present

## 2023-10-03 DIAGNOSIS — F419 Anxiety disorder, unspecified: Secondary | ICD-10-CM | POA: Diagnosis not present

## 2023-10-03 DIAGNOSIS — F32A Depression, unspecified: Secondary | ICD-10-CM | POA: Diagnosis not present

## 2023-10-03 DIAGNOSIS — M25551 Pain in right hip: Secondary | ICD-10-CM

## 2023-10-03 DIAGNOSIS — M6281 Muscle weakness (generalized): Secondary | ICD-10-CM

## 2023-10-03 DIAGNOSIS — M5417 Radiculopathy, lumbosacral region: Secondary | ICD-10-CM

## 2023-10-03 DIAGNOSIS — F5089 Other specified eating disorder: Secondary | ICD-10-CM

## 2023-10-03 NOTE — Progress Notes (Signed)
  Office: [phone]  /  Fax: [phone]    Date: October 03, 2023  Appointment Start Time: 3:37pm Duration: 31 minutes Provider: Wyatt Fire, Psy.D. Type of Session: Individual Therapy  Location of Patient: Home (private location) Location of Provider: Provider's Home (private office) Type of Contact: Telepsychological Visit via MyChart Video Visit  Session Content: Margaret Reese is a 78 y.o. female presenting for a follow-up appointment to address the previously established treatment goal of increasing coping skills. Today's appointment was a telepsychological visit. Margaret Reese provided verbal consent for today's telepsychological appointment and she is aware she is responsible for securing confidentiality on her end of the session. Prior to proceeding with today's appointment, Margaret Reese's physical location at the time of this appointment was obtained as well a phone number she could be reached at in the event of technical difficulties. Margaret Reese and this provider participated in today's telepsychological service.   This provider conducted a brief check-in. Margaret Reese shared she is experiencing ongoing physical pain, noting she has an appointment with a surgeon tomorrow. Due to ongoing pain, she disclosed engagement in emotional eating behaviors. She further shared she prefers not to take medications; therefore, she is lying down often. Further psychoeducation provided regarding mindfulness and its benefits. Given recent circumstances, Margaret Reese's pain was assessed on a scale of 0-10, where 0 is no pain and 10 is the worst pain ever experienced. She noted her pain as 4. Margaret Reese was engaged in a body scan mindfulness exercise to assist with coping. Her experience was processed and pain was re-assessed using the same scale. She noted her pain as 3 following the exercise and her experience was processed. Discussed using Margaret Reese' body scan audio on YouTube to practice daily. Overall, Margaret Reese was receptive to today's  appointment as evidenced by openness to sharing, responsiveness to feedback, and willingness to implement discussed strategies .  Mental Status Examination:  Appearance: neat Behavior: appropriate to circumstances Mood: neutral Affect: mood congruent Speech: WNL Eye Contact: appropriate Psychomotor Activity: WNL Gait: unable to assess Thought Process: linear, logical, and goal directed and no evidence or endorsement of suicidal, homicidal, and self-harm ideation, plan and intent  Thought Content/Perception: no hallucinations, delusions, bizarre thinking or behavior endorsed or observed Orientation: AAOx4 Memory/Concentration: intact Insight: fair Judgment: fair  Interventions:  Conducted a brief chart review Provided empathic reflections and validation Reviewed content from the previous session Provided positive reinforcement Employed supportive psychotherapy interventions to facilitate reduced distress and to improve coping skills with identified stressors Employed acceptance and commitment interventions to emphasize mindfulness and acceptance without struggle  DSM-5 Diagnosis(es): F50.89 Other Specified Feeding or Eating Disorder, Emotional Eating Behaviors, F41.9 Unspecified Anxiety Disorder, and  F32.A Unspecified Depressive Disorder  Treatment Goal & Progress: During the initial appointment with this provider, the following treatment goal was established: increase coping skills. Margaret Reese has demonstrated progress in her goal as evidenced by increased awareness of hunger patterns. Margaret Reese also continues to demonstrate willingness to engage in learned skills (e.g., journaling, mindfulness).  Plan: The next appointment is scheduled for 10/24/2023 at 12:30pm, which will be via MyChart Video Visit. The next session will focus on working towards the established treatment goal. Margaret Reese will continue to journal and engage in body scan exercise.    Wyatt Fire, PsyD

## 2023-10-03 NOTE — Therapy (Signed)
 OUTPATIENT PHYSICAL THERAPY THORACOLUMBAR TREATMENT    Patient Name: Margaret Reese MRN: [id number] DOB:1945/08/26, 78 y.o., female Today's Date: 10/03/2023  END OF SESSION:  PT End of Session - 10/03/23 0834     Visit Number 6    Date for PT Re-Evaluation 11/14/23    Authorization Type Medicare    PT Start Time 0835    PT Stop Time 0920    PT Time Calculation (min) 45 min            Past Medical History:  Diagnosis Date   Allergy    Anxiety    Arthritis    Asthma    Complication of anesthesia    pt states has difficulty awakening; also has increased sinus drainage   Constipation    Edema, lower extremity    Encounter for interrogation of cardiac pacemaker 09/05/2018   Falls    GERD (gastroesophageal reflux disease)    H/O back injury    History of bronchitis    History of colon polyps    Hypertension    Imbalance    Kidney cysts    pt states not sure which kidney does see kidney specialist yearly pt states every thing okay currently    Legally blind in right eye, as defined in [place]     Mobitz type II atrioventricular block 11/08/2016   Multiple gastric ulcers    Obesity    OSA (obstructive sleep apnea) 05/03/2022   Pacemaker S/P [employer] Jude [employer]urity MRI model [id number] 05/20/2016   Renal stone    Retinal vein occlusion    Shortness of breath    Sinus node dysfunction (HCC) 12/16/2018   Tingling    left arm    Trace cataracts    Urinary frequency    Vitamin D  deficiency    Past Surgical History:  Procedure Laterality Date   CARDIOVASCULAR STRESS TEST  dec 2016   CHOLECYSTECTOMY     COLONOSCOPY  2011   EYE SURGERY Bilateral    Cataract surgery.    LEFT HEART CATH AND CORONARY ANGIOGRAPHY N/A 05/19/2016   Procedure: Left Heart Cath and Coronary Angiography;  Surgeon: Salena Negri, MD;  Location: [facility];  Service: Cardiovascular;  Laterality: N/A;   PACEMAKER IMPLANT N/A 05/20/2016   Procedure: Pacemaker Implant;  Surgeon: Will Gladis Norton,  MD;  Location: [facility];  Service: Cardiovascular;  Laterality: N/A;   TONSILLECTOMY     TOTAL HIP ARTHROPLASTY Right 01/30/2015   Procedure: RIGHT TOTAL HIP ARTHROPLASTY ANTERIOR APPROACH AND REMOVAL LIPOMA RIGHT HIP;  Surgeon: Lonni CINDERELLA Poli, MD;  Location: [facility];  Service: Orthopedics;  Laterality: Right;   UPPER GI ENDOSCOPY     Patient Active Problem List   Diagnosis Date Noted   Back pain 12/26/2022   Fluid retention in legs 11/13/2022   Stress 06/14/2022   Obesity (HCC)- Start BMI 40.06 05/12/2022   OSA (obstructive sleep apnea) 05/03/2022   Hyperglycemia 03/15/2022   Heart block AV complete (HCC) 01/05/2022   Pulmonary hypertension (HCC) 11/19/2021   Chronic diastolic (congestive) heart failure (HCC) 06/01/2020   Pacemaker S/P St Jude Medical Assurity MRI model [id number] 05/20/2016   History of total right hip arthroplasty 01/30/2015   Arthritis 09/11/2014   Renal cyst 09/04/2014   Allergic rhinitis due to pollen 09/04/2014   Essential hypertension 07/08/2010   GERD (gastroesophageal reflux disease) 07/08/2010   Calcium oxalate renal stones 07/08/2010   Hyperlipidemia 07/08/2010    PCP: Norleen Jobs  REFERRING  PROVIDER: Lonni Poli  REFERRING DIAG:  [phone] (ICD-10-CM) - History of total right hip arthroplasty  M54.41,G89.29 (ICD-10-CM) - Chronic right-sided low back pain with right-sided sciatica    Rationale for Evaluation and Treatment: Rehabilitation  THERAPY DIAG:  Radiculopathy, lumbosacral region  Bilateral hip pain  Muscle weakness (generalized)  ONSET DATE: 08/16/23 referral  SUBJECTIVE:                                                                                                                                                                                           SUBJECTIVE STATEMENT: using rolling pin and it is helping some but it hurts. Using show lift now too   EVAL: I started getting pain, and I thought it was my  replacement. The pain was shooting from my groin and across the thigh. Now I have pain on both sides. I almost fell at the grocery store reaching down to grab something. It felt like electric shock down my leg. Stairs are hard to do because it is painful.   PERTINENT HISTORY:  Patient is a 77 year old female who is almost 9 years out from a right total hip arthroplasty.  She comes in today ambulating with a cane.  She has been having a hard time getting up from a seated position and up from the floor when she is down on the floor.  She reports sciatic pain on the right side and also some left lower extremity and right lower extremity radicular types of symptoms.  She is not a diabetic.  She takes anti-inflammatories over-the-counter on a rare basis.  She denies any change in bowel or bladder function.   On exam both hips move smoothly and fluidly with no blocks or rotation.  Most of her pain seems to be with positive straight leg raise on both sides more so on the right than the left but she does have left lower extremity radicular symptoms as well.  PAIN:  Are you having pain? Yes: NPRS scale: 4/10 Pain location: both hips, into legs, some pain in low back Pain description: becoming more frequent and intense, electrical shock, achy  Aggravating factors: bending, standing too long Relieving factors: nothing really  PRECAUTIONS: None  RED FLAGS: None   WEIGHT BEARING RESTRICTIONS: No  FALLS:  Has patient fallen in last 6 months? No  LIVING ENVIRONMENT: Lives with: lives with their spouse Lives in: House/apartment Stairs: Yes: External: 5 steps; bilateral but cannot reach both Has following equipment at home: Single point cane and Walker - 2 wheeled  PLOF: Independent and Independent with basic ADLs  PATIENT GOALS: to not hurt and I don't want  to risk falling  NEXT MD VISIT: 09/20/23  OBJECTIVE:  Note: Objective measures were completed at Evaluation unless otherwise  noted.  DIAGNOSTIC FINDINGS:  2 views of the lumbar spine show a significant grade 1 spondylolisthesis at L4-L5 with some degenerative changes as well. An AP pelvis and lateral of the right hip shows a well-seated right total hip arthroplasty. There is moderate arthritis of the left hip.    COGNITION: Overall cognitive status: Within functional limits for tasks assessed     SENSATION: WFL   POSTURE: rounded shoulders and forward head  PALPATION: Some tenderness in L2-L5  LUMBAR ROM:   AROM eval 09/28/23 10/03/23  Flexion Can touch toes, painful coming back up [facility] WFLs  Extension 25% with pain 25# with pain Limited 75%  Right lateral flexion Mid thigh with pain Limited 25% Limited 25%  Left lateral flexion Fibular head no pain Limited 25% Limited 25%  Right rotation 25% [facility] WFL  Left rotation 50% WFL WFL   (Blank rows = not tested)  LOWER EXTREMITY ROM:   grossly within functional ranges   LOWER EXTREMITY MMT:  grossly 5/5, some pain with resisted knee extension on RLE, hip extension 4/5  LUMBAR SPECIAL TESTS:  Straight leg raise test: Positive and FABER test: Positive  FUNCTIONAL TESTS:  5 times sit to stand: 10.39s Timed up and go (TUG): 15.53s w/o cane  GAIT: Distance walked: in clinic distances Assistive device utilized: Single point cane Level of assistance: Complete Independence  TREATMENT DATE:  10/03/23 PROM LE and trunk STW to distal ITB Feet on ball bridge,KTC and obl Green tband clams BIL 10 x then SL 10 x Green tband marching 10 x BIL  2 sets ADD ball squeeze 10 x then 10x with bridge SLR 10x BIL then SLR with abd 10 x 3# LAQ 20 x alt 3# hip flex 20 x alt 3# standing hip flex,ext and abd 10 x each  6in 3# step tap with SPC fwd and laterally 10 x each  09/28/23  Feet on ball bridge,KTC and obl- left knee catch with KTC, cuing with rotation to not twist so much Isometric abdominals 3 sec hold PROM LE and trunk  Left ITB stretching and STW- very  tender. Instructed in rolling at home and stretching SL hip 3 way 10 x BIL In // bars on foam roll 3 x  laterally ADD ball squeeze 15 x Clams 2 sets 10 STS with wt ball OH 10 x    09/25/23 Walking 2 laps with SPC   Supine LTR, HS stretch, SKTC Feet on pball rotations and knees to chest  Ball squeeze 2x10 Bridges 2x10 STS 2x10 Calf stretch  Standing rows and ext red band 2x10  09/21/23  Declined Nustep- last time my pacemaker was really revving up and I was very concerned and surprised, I don't need that stress today   Spent some time discussing and educating on prior imaging findings (spondylolisthesis, hip OA, etc) and how it is possibly affecting (or not affecting) current pain levels   Supine hip flexor stretch 2x30 seconds B Supine frog stretch 2x30 seconds Piriformis stretch 2x30 seconds B  HS/gastroc stretch 2x30 seconds B  Seated TA set 15x3 seconds Seated TA set + mini-march x12 Seated TA set + opposite UE/LE flexion x10B STS x10 no UEs cues for eccentric lower     09/14/23 Nustep L 5 LE only at 4 1/2 min decreased resistance to 3 Black tband trunk ext 2 sets 10 ADD  ball squeeze 2 sets 10 Clams red tband 2 sets 10  Red tband hip flex 2 sets 10 Iso abdominals 2 set s 10 Wt ball chest press then obl 10 x each seated 3# alt LAQ 20 x 3 # hip abd with flexion 10 x BIL 2 sets  Standing 3# hip ext and abd 10 x Legs on ball bridge 2 sets 10 Legs on ball obl 20 x     09/05/23 EVAL                                                                                                                                PATIENT EDUCATION:  Education details: POC, HEP, sciatica, hip flexor tightness sleeping in recliners Person educated: Patient Education method: Medical illustrator Education comprehension: verbalized understanding and returned demonstration  HOME EXERCISE PROGRAM: Access Code: [email or web address] URL: [email or web address] Date:  10/03/2023 Prepared by: Kordel Leavy  Exercises - Supine Bridge  - 1 x daily - 7 x weekly - 1 sets - 10 reps - 3 hold - Supine Hip Adduction Isometric with Ball  - 1 x daily - 7 x weekly - 2 sets - 10 reps - Supine March  - 1 x daily - 7 x weekly - 2 sets - 10 reps - Hooklying Clamshell with Resistance  - 1 x daily - 7 x weekly - 2 sets - 10 reps - Supine Straight Leg Raises  - 1 x daily - 7 x weekly - 3 sets - 10 reps - Seated Long Arc Quad  - 1 x daily - 7 x weekly - 2 sets - 10 reps - Seated March  - 1 x daily - 7 x weekly - 2 sets - 10 reps - Seated Hip Flexion and External Rotation  - 1 x daily - 7 x weekly - 2 sets - 10 reps - Standing March with Counter Support  - 1 x daily - 7 x weekly - 1 sets - 10 reps - Standing Hip Abduction with Counter Support  - 1 x daily - 7 x weekly - 1 sets - 10 reps - Standing Hip Extension with Counter Support  - 1 x daily - 7 x weekly - 1 sets - 10 reps Access Code: [email or web address] URL: [email or web address] Date: 09/05/2023 Prepared by: Almetta Fam  Exercises - Supine Lower Trunk Rotation  - 1 x daily - 7 x weekly - 2 sets - 10 reps - Supine Bridge  - 1 x daily - 7 x weekly - 2 sets - 10 reps - Supine Figure 4 Piriformis Stretch  - 1 x daily - 7 x weekly - 2 reps - 15 hold - Modified Thomas Stretch  - 1 x daily - 7 x weekly - 2 reps - 15 hold  ASSESSMENT:  CLINICAL IMPRESSION:  Pt feels ITB rolling at home has helped some on Left esp up towards hip, distal at knee tight and painful.Pt is now wearing  lift in left show and that seems to help her gait, less trend[place]umburg. Pt states no back pain at start of therapy but now back pain probably from increased mvmt Pt has catch in RT hip flex/thigh with mvmt impeding stairs and car transfers. Overall pt feels PT is helpful [facility] Talked with pt about need to get stronger to help support joints and lessen pain and she VU. Increased HEP   Patient is a 78 y.o. female who  was seen today for physical therapy evaluation and treatment for LBP and hip pain. She started using her cane again a few months ago when her pain started becoming more of an issue. She states she mostly takes it outside the house, especially when she knows she has to walk a distance. Patient reports sleeping in the recliner since her hip surgery because pain in the knees and hip and difficulty getting out of bed. She would like to be able to sleep in her bed again at least for a few hours at a time. Her images show grade 1 spondylolisthesis at L4-L5 with some degenerative changes as well. Patient reports some issues with depth perception so she has to be careful with steps, curbs, and stairs. Patient will benefit from skilled PT to address her LBP and hip pain to increase her activity tolerance.  OBJECTIVE IMPAIRMENTS: Abnormal gait, decreased activity tolerance, decreased balance, decreased ROM, decreased strength, and pain.   ACTIVITY LIMITATIONS: bending, squatting, stairs, and locomotion level  PARTICIPATION LIMITATIONS: cleaning, laundry, driving, shopping, community activity, and yard work  PERSONAL FACTORS: Age, Past/current experiences, Time since onset of injury/illness/exacerbation, and 1-2 comorbidities: arthritis, hx of surgery are also affecting patient's functional outcome.   REHAB POTENTIAL: Good  CLINICAL DECISION MAKING: Stable/uncomplicated  EVALUATION COMPLEXITY: Low  GOALS: Goals reviewed with patient? Yes  SHORT TERM GOALS: Target date: 10/10/23  Patient will be independent with initial HEP.  Baseline:  Goal status: 09/14/23 progressing  09/28/23 MET  2.  Patient will report centralization of radicular symptoms.  Baseline: pain in both legs Goal status: 09/28/23 no changes   10/03/23 progressing   LONG TERM GOALS: Target date: 11/14/23  Patient will be independent with advanced/ongoing HEP to improve outcomes and carryover.  Baseline:  Goal status: INITIAL  2.   Patient will report 50-75% improvement in low back pain to improve QOL.  Baseline: 5/10 Goal status: 10/03/23 progressing  3.  Patient will demonstrate full pain free lumbar ROM to perform ADLs.   Baseline: see chart Goal status: 10/03/23 progressing  4.  Patient will be up and down a set of stairs without pain.  Baseline: pain going up and down Goal status: ongoing 10/03/23  5.  Patient will be able to sleep in her bed again for at least 3-4 hrs.  Baseline: 1 hr tolerance  Goal status: 10/03/23 progressing  6.  Patient will tolerate 30 min of standing to perform ADLs and household chores Baseline: ~10 mins Goal status: 10/03/23 progressing  PLAN:  PT FREQUENCY: 2x/week  PT DURATION: 10 weeks  PLANNED INTERVENTIONS: 97110-Therapeutic exercises, 97530- Therapeutic activity, 97112- Neuromuscular re-education, 97535- Self Care, [id number]- Manual therapy, [phone]- Gait training, [phone]- Electrical stimulation (unattended), [id number]- Vasopneumatic device, [id number]- Traction (mechanical), [phone] (1-2 muscles), [id number] (3+ muscles)- Dry Needling, Patient/Family education, Balance training, Stair training, Taping, Joint mobilization, Spinal mobilization, Cryotherapy, and Moist heat.  PLAN FOR NEXT SESSION: assess MD appointment Jon Eesha Schmaltz PTA 10/03/23 9:19 AM

## 2023-10-04 ENCOUNTER — Ambulatory Visit: Admitting: Orthopaedic Surgery

## 2023-10-04 ENCOUNTER — Encounter: Payer: Self-pay | Admitting: Orthopaedic Surgery

## 2023-10-04 DIAGNOSIS — M5441 Lumbago with sciatica, right side: Secondary | ICD-10-CM | POA: Diagnosis not present

## 2023-10-04 DIAGNOSIS — M79604 Pain in right leg: Secondary | ICD-10-CM | POA: Diagnosis not present

## 2023-10-04 DIAGNOSIS — G8929 Other chronic pain: Secondary | ICD-10-CM | POA: Diagnosis not present

## 2023-10-04 DIAGNOSIS — Z96641 Presence of right artificial hip joint: Secondary | ICD-10-CM

## 2023-10-04 NOTE — Progress Notes (Signed)
 The patient is a 78 year old female who comes in for follow-up after having physical therapy to help with her generalized mobility.  She does ambulate with a cane.  We replaced her right hip back in 2016.  She is having some issues as relates to her spine and a grade 1 spondylolisthesis at L4-L5.  More recently she has been dealing with left hip pain over the trochanteric area of her left hip that radiates down the IT band.  She does wear a lift on that side that is been recommended recently.  X-rays of the pelvis and hips in June did not show any significant arthritis of the left hip.  On exam most of her pain is over the left hip trochanteric area and IT band.  The hip otherwise moves smoothly and fluidly.  I agree with the notes that therapy is send us  about the things that they can work on to get this to feel better.  I have also recommended over-the-counter topical Voltaren gel as well as Salonpas patches to try and alternate.  She can alternate ice and heat as well.  Will see her back in 6 weeks to see what progress she is making.  If she is improving significantly she can cancel that appointment.  I did offer a steroid injection but she is deferred at this since she is making some improvements and wants to see what therapy does for her as well as the topical anti-inflammatories.

## 2023-10-05 ENCOUNTER — Ambulatory Visit: Admitting: Physical Therapy

## 2023-10-05 DIAGNOSIS — M25551 Pain in right hip: Secondary | ICD-10-CM

## 2023-10-05 DIAGNOSIS — M25552 Pain in left hip: Secondary | ICD-10-CM | POA: Diagnosis not present

## 2023-10-05 DIAGNOSIS — M6281 Muscle weakness (generalized): Secondary | ICD-10-CM | POA: Diagnosis not present

## 2023-10-05 DIAGNOSIS — M5417 Radiculopathy, lumbosacral region: Secondary | ICD-10-CM

## 2023-10-05 NOTE — Therapy (Signed)
 OUTPATIENT PHYSICAL THERAPY THORACOLUMBAR TREATMENT    Patient Name: Margaret Reese MRN: [id number] DOB:[birth year], 78 y.o., female Today's Date: 10/05/2023  END OF SESSION:  PT End of Session - 10/05/23 0924     Visit Number 7    Date for PT Re-Evaluation 11/14/23    Authorization Type Medicare    PT Start Time 0925    PT Stop Time 1005    PT Time Calculation (min) 40 min            Past Medical History:  Diagnosis Date   Allergy    Anxiety    Arthritis    Asthma    Complication of anesthesia    pt states has difficulty awakening; also has increased sinus drainage   Constipation    Edema, lower extremity    Encounter for interrogation of cardiac pacemaker 09/05/2018   Falls    GERD (gastroesophageal reflux disease)    H/O back injury    History of bronchitis    History of colon polyps    Hypertension    Imbalance    Kidney cysts    pt states not sure which kidney does see kidney specialist yearly pt states every thing okay currently    Legally blind in right [place], [place] defined in [place]     Mobitz type II atrioventricular block 11/08/2016   Multiple gastric ulcers    Obesity    OSA (obstructive sleep apnea) 05/03/2022   Pacemaker S/P St Jude Medical Assurity MRI model [id number] 05/20/2016   Renal stone    Retinal vein occlusion    Shortness of breath    Sinus node dysfunction (HCC) 12/16/2018   Tingling    left arm    Trace cataracts    Urinary frequency    Vitamin D  deficiency    Past Surgical History:  Procedure Laterality Date   CARDIOVASCULAR STRESS TEST  dec 2016   CHOLECYSTECTOMY     COLONOSCOPY  2011   EYE SURGERY Bilateral    Cataract surgery.    LEFT HEART CATH AND CORONARY ANGIOGRAPHY N/A 05/19/2016   Procedure: Left Heart Cath and Coronary Angiography;  Surgeon: Salena Negri, MD;  [facility]: [facility];  Service: Cardiovascular;  Laterality: N/A;   PACEMAKER IMPLANT N/A 05/20/2016   Procedure: Pacemaker Implant;  Surgeon: Will Gladis Norton,  MD;  Location: [facility];  Service: Cardiovascular;  Laterality: N/A;   TONSILLECTOMY     TOTAL HIP ARTHROPLASTY Right 01/30/2015   Procedure: RIGHT TOTAL HIP ARTHROPLASTY ANTERIOR APPROACH AND REMOVAL LIPOMA RIGHT HIP;  Surgeon: Lonni CINDERELLA Poli, MD;  Location: [facility];  Service: Orthopedics;  Laterality: Right;   UPPER GI ENDOSCOPY     Patient Active Problem List   Diagnosis Date Noted   Back pain 12/26/2022   Fluid retention in legs 11/13/2022   Stress 06/14/2022   Obesity (HCC)- Start BMI 40.06 05/12/2022   OSA (obstructive sleep apnea) 05/03/2022   Hyperglycemia 03/15/2022   Heart block AV complete (HCC) 01/05/2022   Pulmonary hypertension (HCC) 11/19/2021   Chronic diastolic (congestive) heart failure (HCC) 06/01/2020   Pacemaker S/P St Jude Medical Assurity MRI model [id number] 05/20/2016   History of total right hip arthroplasty 01/30/2015   Arthritis 09/11/2014   Renal cyst 09/04/2014   Allergic rhinitis due to pollen 09/04/2014   Essential hypertension 07/08/2010   GERD (gastroesophageal reflux disease) 07/08/2010   Calcium oxalate renal stones 07/08/2010   Hyperlipidemia 07/08/2010    PCP: Norleen Jobs  REFERRING  PROVIDER: Lonni Poli  REFERRING DIAG:  [phone] (ICD-10-CM) - History of total right hip arthroplasty  M54.41,G89.29 (ICD-10-CM) - Chronic right-sided low back pain with right-sided sciatica    Rationale for Evaluation and Treatment: Rehabilitation  THERAPY DIAG:  Radiculopathy, lumbosacral region  Bilateral hip pain  Muscle weakness (generalized)  ONSET DATE: 08/16/23 referral  SUBJECTIVE:                                                                                                                                                                                           SUBJECTIVE STATEMENT: not much said at MD appt just continue PT RT thigh very sore. PT helping but wears me out. Doing HEP. Pt reports left leg caught  yesterday and I almost fell.  EVAL: I started getting pain, and I thought it was my replacement. The pain was shooting from my groin and across the thigh. Now I have pain on both sides. I almost fell at the grocery store reaching down to grab something. It felt like electric shock down my leg. Stairs are hard to do because it is painful.   PERTINENT HISTORY:  Patient is a 78 year old female who is almost 9 years out from a right total hip arthroplasty.  She comes in today ambulating with a cane.  She has been having a hard time getting up from a seated position and up from the floor when she is down on the floor.  She reports sciatic pain on the right side and also some left lower extremity and right lower extremity radicular types of symptoms.  She is not a diabetic.  She takes anti-inflammatories over-the-counter on a rare basis.  She denies any change in bowel or bladder function.   On exam both hips move smoothly and fluidly with no blocks or rotation.  Most of her pain seems to be with positive straight leg raise on both sides more so on the right than the left but she does have left lower extremity radicular symptoms as well.  PAIN:  Are you having pain? Yes: NPRS scale: 4/10 Pain location: both hips, into legs, some pain in low back Pain description: becoming more frequent and intense, electrical shock, achy  Aggravating factors: bending, standing too long Relieving factors: nothing really  PRECAUTIONS: None  RED FLAGS: None   WEIGHT BEARING RESTRICTIONS: No  FALLS:  Has patient fallen in last 6 months? No  LIVING ENVIRONMENT: Lives with: lives with their spouse Lives in: House/apartment Stairs: Yes: External: 5 steps; bilateral but cannot reach both Has following equipment at home: Single point cane and Walker - 2 wheeled  PLOF: Independent and  Independent with basic ADLs  PATIENT GOALS: to not hurt and I don't want to risk falling  NEXT MD VISIT: 09/20/23  OBJECTIVE:   Note: Objective measures were completed at Evaluation unless otherwise noted.  DIAGNOSTIC FINDINGS:  2 views of the lumbar spine show a significant grade 1 spondylolisthesis at L4-L5 with some degenerative changes as well. An AP pelvis and lateral of the right hip shows a well-seated right total hip arthroplasty. There is moderate arthritis of the left hip.    COGNITION: Overall cognitive status: Within functional limits for tasks assessed     SENSATION: WFL   POSTURE: rounded shoulders and forward head  PALPATION: Some tenderness in L2-L5  LUMBAR ROM:   AROM eval 09/28/23 10/03/23  Flexion Can touch toes, painful coming back up [facility] WFLs  Extension 25% with pain 25# with pain Limited 75%  Right lateral flexion Mid thigh with pain Limited 25% Limited 25%  Left lateral flexion Fibular head no pain Limited 25% Limited 25%  Right rotation 25% [facility] WFL  Left rotation 50% WFL WFL   (Blank rows = not tested)  LOWER EXTREMITY ROM:   grossly within functional ranges   LOWER EXTREMITY MMT:  grossly 5/5, some pain with resisted knee extension on RLE, hip extension 4/5  LUMBAR SPECIAL TESTS:  Straight leg raise test: Positive and FABER test: Positive  FUNCTIONAL TESTS:  5 times sit to stand: 10.39s Timed up and go (TUG): 15.53s w/o cane  GAIT: Distance walked: in clinic distances Assistive device utilized: Single point cane Level of assistance: Complete Independence  TREATMENT DATE:  10/05/23 PROM LE and trunk  STW RT hip flex and quad on a stretch STW to Left ITB on stretch Pelvic ROM and stab ex on dyna disc 4 in step up fwd with opp hip ext 10 x with UE 4 in step up with opp hip abd x 10 with UE Seated trunk flex and ext 2 sets 10 blue tband Educ in hip flex stretch off bed and adding rolling to RT thigh  10/03/23 PROM LE and trunk STW to distal ITB Feet on ball bridge,KTC and obl Green tband clams BIL 10 x then SL 10 x Green tband marching 10 x BIL  2 sets ADD  ball squeeze 10 x then 10x with bridge SLR 10x BIL then SLR with abd 10 x 3# LAQ 20 x alt 3# hip flex 20 x alt 3# standing hip flex,ext and abd 10 x each  6in 3# step tap with SPC fwd and laterally 10 x each  09/28/23  Feet on ball bridge,KTC and obl- left knee catch with KTC, cuing with rotation to not twist so much Isometric abdominals 3 sec hold PROM LE and trunk  Left ITB stretching and STW- very tender. Instructed in rolling at home and stretching SL hip 3 way 10 x BIL In // bars on foam roll 3 x  laterally ADD ball squeeze 15 x Clams 2 sets 10 STS with wt ball OH 10 x    09/25/23 Walking 2 laps with SPC   Supine LTR, HS stretch, SKTC Feet on pball rotations and knees to chest  Ball squeeze 2x10 Bridges 2x10 STS 2x10 Calf stretch  Standing rows and ext red band 2x10  09/21/23  Declined Nustep- last time my pacemaker was really revving up and I was very concerned and surprised, I don't need that stress today   Spent some time discussing and educating on prior imaging findings (spondylolisthesis, hip OA, etc)  and how it is possibly affecting (or not affecting) current pain levels   Supine hip flexor stretch 2x30 seconds B Supine frog stretch 2x30 seconds Piriformis stretch 2x30 seconds B  HS/gastroc stretch 2x30 seconds B  Seated TA set 15x3 seconds Seated TA set + mini-march x12 Seated TA set + opposite UE/LE flexion x10B STS x10 no UEs cues for eccentric lower     09/14/23 Nustep L 5 LE only at 4 1/2 min decreased resistance to 3 Black tband trunk ext 2 sets 10 ADD ball squeeze 2 sets 10 Clams red tband 2 sets 10  Red tband hip flex 2 sets 10 Iso abdominals 2 set s 10 Wt ball chest press then obl 10 x each seated 3# alt LAQ 20 x 3 # hip abd with flexion 10 x BIL 2 sets  Standing 3# hip ext and abd 10 x Legs on ball bridge 2 sets 10 Legs on ball obl 20 x     09/05/23 EVAL                                                                                                                                 PATIENT EDUCATION:  Education details: POC, HEP, sciatica, hip flexor tightness sleeping in recliners Person educated: Patient Education method: Medical illustrator Education comprehension: verbalized understanding and returned demonstration  HOME EXERCISE PROGRAM: Access Code: [email or web address] URL: [email or web address] Date: 10/03/2023 Prepared by: Staisha Winiarski  Exercises - Supine Bridge  - 1 x daily - 7 x weekly - 1 sets - 10 reps - 3 hold - Supine Hip Adduction Isometric with Ball  - 1 x daily - 7 x weekly - 2 sets - 10 reps - Supine March  - 1 x daily - 7 x weekly - 2 sets - 10 reps - Hooklying Clamshell with Resistance  - 1 x daily - 7 x weekly - 2 sets - 10 reps - Supine Straight Leg Raises  - 1 x daily - 7 x weekly - 3 sets - 10 reps - Seated Long Arc Quad  - 1 x daily - 7 x weekly - 2 sets - 10 reps - Seated March  - 1 x daily - 7 x weekly - 2 sets - 10 reps - Seated Hip Flexion and External Rotation  - 1 x daily - 7 x weekly - 2 sets - 10 reps - Standing March with Counter Support  - 1 x daily - 7 x weekly - 1 sets - 10 reps - Standing Hip Abduction with Counter Support  - 1 x daily - 7 x weekly - 1 sets - 10 reps - Standing Hip Extension with Counter Support  - 1 x daily - 7 x weekly - 1 sets - 10 reps Access Code: [email or web address] URL: [email or web address] Date: 09/05/2023 Prepared by: Almetta Fam  Exercises - Supine Lower Trunk Rotation  - 1 x daily - 7 x weekly - 2  sets - 10 reps - Supine Bridge  - 1 x daily - 7 x weekly - 2 sets - 10 reps - Supine Figure 4 Piriformis Stretch  - 1 x daily - 7 x weekly - 2 reps - 15 hold - Modified Thomas Stretch  - 1 x daily - 7 x weekly - 2 reps - 15 hold  ASSESSMENT:  CLINICAL IMPRESSION:  Progressed ex with cuing. Added RT hip flexor and quad stretch and STW her and educ for home. Talked with pt about following up with heart MD ( as she is due fir a  check) regarding low energy and increased fatgue. Patient is a 78 y.o. female who was seen today for physical therapy evaluation and treatment for LBP and hip pain. She started using her cane again a few months ago when her pain started becoming more of an issue. She states she mostly takes it outside the house, especially when she knows she has to walk a distance. Patient reports sleeping in the recliner since her hip surgery because pain in the knees and hip and difficulty getting out of bed. She would like to be able to sleep in her bed again at least for a few hours at a time. Her images show grade 1 spondylolisthesis at L4-L5 with some degenerative changes as well. Patient reports some issues with depth perception so she has to be careful with steps, curbs, and stairs. Patient will benefit from skilled PT to address her LBP and hip pain to increase her activity tolerance.  OBJECTIVE IMPAIRMENTS: Abnormal gait, decreased activity tolerance, decreased balance, decreased ROM, decreased strength, and pain.   ACTIVITY LIMITATIONS: bending, squatting, stairs, and locomotion level  PARTICIPATION LIMITATIONS: cleaning, laundry, driving, shopping, community activity, and yard work  PERSONAL FACTORS: Age, Past/current experiences, Time since onset of injury/illness/exacerbation, and 1-2 comorbidities: arthritis, hx of surgery are also affecting patient's functional outcome.   REHAB POTENTIAL: Good  CLINICAL DECISION MAKING: Stable/uncomplicated  EVALUATION COMPLEXITY: Low  GOALS: Goals reviewed with patient? Yes  SHORT TERM GOALS: Target date: 10/10/23  Patient will be independent with initial HEP.  Baseline:  Goal status: 09/14/23 progressing  09/28/23 MET  2.  Patient will report centralization of radicular symptoms.  Baseline: pain in both legs Goal status: 09/28/23 no changes   10/03/23 progressing   LONG TERM GOALS: Target date: 11/14/23  Patient will be independent with advanced/ongoing HEP  to improve outcomes and carryover.  Baseline:  Goal status: INITIAL  2.  Patient will report 50-75% improvement in low back pain to improve QOL.  Baseline: 5/10 Goal status: 10/03/23 progressing  3.  Patient will demonstrate full pain free lumbar ROM to perform ADLs.   Baseline: see chart Goal status: 10/03/23 progressing  4.  Patient will be up and down a set of stairs without pain.  Baseline: pain going up and down Goal status: ongoing 10/03/23  5.  Patient will be able to sleep in her bed again for at least 3-4 hrs.  Baseline: 1 hr tolerance  Goal status: 10/03/23 progressing  6.  Patient will tolerate 30 min of standing to perform ADLs and household chores Baseline: ~10 mins Goal status: 10/03/23 progressing  PLAN:  PT FREQUENCY: 2x/week  PT DURATION: 10 weeks  PLANNED INTERVENTIONS: 97110-Therapeutic exercises, 97530- Therapeutic activity, 97112- Neuromuscular re-education, 97535- Self Care, 02859- Manual therapy, [id number]- Gait training, [phone]- Electrical stimulation (unattended), 97016- Vasopneumatic device, [id number]- Traction (mechanical), 20560 (1-2 muscles), 20561 (3+ muscles)- Dry Needling, Patient/Family education, Balance training, Stair  training, Taping, Joint mobilization, Spinal mobilization, Cryotherapy, and Moist heat.  PLAN FOR NEXT SESSION: assess and progress Jon Or PTA 10/05/23 10:07 AM

## 2023-10-10 ENCOUNTER — Ambulatory Visit (INDEPENDENT_AMBULATORY_CARE_PROVIDER_SITE_OTHER): Admitting: Pulmonary Disease

## 2023-10-10 ENCOUNTER — Encounter: Payer: Self-pay | Admitting: Pulmonary Disease

## 2023-10-10 VITALS — BP 127/75 | HR 86 | Temp 98.0°F | Ht 60.0 in | Wt 200.0 lb

## 2023-10-10 DIAGNOSIS — G4733 Obstructive sleep apnea (adult) (pediatric): Secondary | ICD-10-CM | POA: Diagnosis not present

## 2023-10-10 DIAGNOSIS — J301 Allergic rhinitis due to pollen: Secondary | ICD-10-CM

## 2023-10-10 NOTE — Patient Instructions (Addendum)
 The download from your machine shows that is working well  I will see you a year from now  Call us  with significant concerns  Continue regular exercises  Continue using your CPAP on a nightly basis

## 2023-10-10 NOTE — Progress Notes (Signed)
 Margaret Reese    [id number]    1945/08/12  Primary Care Physician:Lalonde, Margaret [name], MD  Referring Physician: Joyce Margaret [name], MD [address] [place],  [place] [address]  Chief complaint:    In for follow-up today  HPI:  Sleep apnea is well controlled Tolerating CPAP well  Waking up feeling like she got a good nights rest  She is having some difficulty with keeping her balance and some pain in her left leg - Being evaluated at present - Receiving physical therapy  Breathing has been relatively stable  She does have a history of mild persistent asthma - Has been able to stay off Breo with no significant increase in use for albuterol   She tries to use a CPAP on a nightly basis - Benefiting from CPAP use   Outpatient Encounter Medications as of 10/10/2023  Medication Sig   aspirin  81 MG tablet Take 81 mg by mouth daily.   BREO ELLIPTA  100-25 MCG/ACT AEPB INHALE 1 PUFF INTO THE LUNGS DAILY   buPROPion  ER (WELLBUTRIN  SR) 100 MG 12 hr tablet Take 1 tablet (100 mg total) by mouth daily with breakfast.   diltiazem  (CARDIZEM  CD) 120 MG 24 hr capsule TAKE 1 CAPSULE BY MOUTH DAILY; HOLD IF BLOOD PRESSURE IS LESS THAN 100 MMHG   loratadine (CLARITIN) 10 MG tablet Take 10 mg by mouth daily as needed for allergies.   Multiple Vitamins-Minerals (PRESERVISION AREDS 2) CAPS Take 1 capsule by mouth 2 (two) times daily.    olmesartan  (BENICAR ) 20 MG tablet TAKE 1 TABLET BY MOUTH EVERY DAY   potassium citrate  (UROCIT-K ) 10 MEQ (1080 MG) SR tablet Take 10 mEq by mouth 2 (two) times daily.   simvastatin  (ZOCOR ) 20 MG tablet Take 1 tablet (20 mg total) by mouth daily.   Vitamin D , Ergocalciferol , (DRISDOL ) 1.25 MG (50000 UNIT) CAPS capsule Take 1 capsule (50,000 Units total) by mouth every 7 (seven) days.   albuterol  (VENTOLIN  HFA) 108 (90 Base) MCG/ACT inhaler Inhale 1-2 puffs into the lungs every 6 (six) hours as needed for wheezing or shortness of breath. (Patient not  taking: Reported on 10/10/2023)   No facility-administered encounter medications on file as of 10/10/2023.    Allergies as of 10/10/2023 - Review Complete 10/10/2023  Allergen Reaction Noted   Toprol  xl [metoprolol  tartrate]  05/01/2020    Past Medical History:  Diagnosis Date   Allergy    Anxiety    Arthritis    Asthma    Complication of anesthesia    pt states has difficulty awakening; also has increased sinus drainage   Constipation    Edema, lower extremity    Encounter for interrogation of cardiac pacemaker 09/05/2018   Falls    GERD (gastroesophageal reflux disease)    H/O back injury    History of bronchitis    History of colon polyps    Hypertension    Imbalance    Kidney cysts    pt states not sure which kidney does see kidney specialist yearly pt states every thing okay currently    Legally blind in right eye, as defined in [place]     Mobitz type II atrioventricular block 11/08/2016   Multiple gastric ulcers    Obesity    OSA (obstructive sleep apnea) 05/03/2022   Pacemaker S/P St Jude Medical Assurity MRI model [id number] 05/20/2016   Renal stone    Retinal vein occlusion    Shortness of breath  Sinus node dysfunction (HCC) 12/16/2018   Tingling    left arm    Trace cataracts    Urinary frequency    Vitamin D  deficiency     Past Surgical History:  Procedure Laterality Date   CARDIOVASCULAR STRESS TEST  dec 2016   CHOLECYSTECTOMY     COLONOSCOPY  2011   EYE SURGERY Bilateral    Cataract surgery.    LEFT HEART CATH AND CORONARY ANGIOGRAPHY N/A 05/19/2016   Procedure: Left Heart Cath and Coronary Angiography;  Surgeon: Salena Negri, MD;  Location: [facility];  Service: Cardiovascular;  Laterality: N/A;   PACEMAKER IMPLANT N/A 05/20/2016   Procedure: Pacemaker Implant;  Surgeon: Will Gladis Norton, MD;  Location: [facility];  Service: Cardiovascular;  Laterality: N/A;   TONSILLECTOMY     TOTAL HIP ARTHROPLASTY Right 01/30/2015   Procedure: RIGHT  TOTAL HIP ARTHROPLASTY ANTERIOR APPROACH AND REMOVAL LIPOMA RIGHT HIP;  Surgeon: Lonni CINDERELLA Poli, MD;  Location: [facility];  Service: Orthopedics;  Laterality: Right;   UPPER GI ENDOSCOPY      Family History  Problem Relation Age of Onset   Depression Mother    Stroke Mother    Kidney disease Mother    Bipolar disorder Mother    Liver disease Mother    Eating disorder Mother    Obesity Mother    Asthma Brother     Social History   Socioeconomic History   Marital status: Married    Spouse name: [name]   Number of children: 2   Years of education: Not on file   Highest education level: Not on file  Occupational History   Occupation: retired Child psychotherapist  Tobacco Use   Smoking status: Never    Passive exposure: Never   Smokeless tobacco: Never  Vaping Use   Vaping status: Never Used  Substance and Sexual Activity   Alcohol use: No   Drug use: No   Sexual activity: Yes  Other Topics Concern   Not on file  Social History Narrative   Not on file   Social Drivers of Health   Financial Resource Strain: Low Risk  (07/11/2023)   Overall Financial Resource Strain (CARDIA)    Difficulty of Paying Living Expenses: Not very hard  Food Insecurity: No Food Insecurity (07/11/2023)   Hunger Vital Sign    Worried About Running Out of Food in the Last Year: Never true    Ran Out of Food in the Last Year: Never true  Transportation Needs: No Transportation Needs (07/11/2023)   PRAPARE - Administrator, Civil Service (Medical): No    Lack of Transportation (Non-Medical): No  Physical Activity: Sufficiently Active (07/11/2023)   Exercise Vital Sign    Days of Exercise per Week: 6 days    Minutes of Exercise per Session: 30 min  Stress: Stress Concern Present (07/11/2023)   [employer] of Occupational Health - Occupational Stress Questionnaire    Feeling of Stress : Rather much  Social Connections: Socially Integrated (07/11/2023)   Social  Connection and Isolation Panel    Frequency of Communication with Friends and Family: Three times a week    Frequency of Social Gatherings with Friends and Family: Three times a week    Attends Religious Services: More than 4 times per year    Active Member of Clubs or Organizations: Yes    Attends Banker Meetings: More than 4 times per year    Marital Status: Married  Intimate Partner Violence: Not At Risk (07/11/2023)   Humiliation, Afraid, Rape, and Kick questionnaire    Fear of Current or Ex-Partner: No    Emotionally Abused: No    Physically Abused: No    Sexually Abused: No    Review of Systems  Constitutional:  Negative for fatigue.  Respiratory:  Positive for apnea and shortness of breath. Negative for wheezing.   Psychiatric/Behavioral:  Positive for sleep disturbance.     Vitals:   10/10/23 0905  BP: 127/75  Pulse: 86  Temp: 98 F (36.7 C)  SpO2: 96%     Physical Exam Constitutional:      Appearance: She is obese.  HENT:     Head: Normocephalic.     Mouth/Throat:     Mouth: Mucous membranes are moist.  Eyes:     General: No scleral icterus. Cardiovascular:     Rate and Rhythm: Normal rate and regular rhythm.     Heart sounds: No murmur heard.    No friction rub.  Pulmonary:     Effort: No respiratory distress.     Breath sounds: No stridor. No wheezing or rhonchi.  Musculoskeletal:     Cervical back: No rigidity or tenderness.  Neurological:     Mental Status: She is alert.  Psychiatric:        Mood and Affect: Mood normal.     Data Reviewed: CPAP compliance reviewed showing excellent compliance with CPAP 100% AutoSet 5-15 95 percentile pressure of 11.3 Residual AHI of 0.7  Assessment:  Mild obstructive sleep apnea, AHI of 10.5 when she was diagnosed - Has been using CPAP on a regular basis and tolerating it well  Has not had any significant issues since her last visit  She has been able to stay off Breo with no increased use  of albuterol  for asthma  Not having significant mask leaks any longer  Musculoskeletal pain and discomfort - Receiving physical therapy  Plan/Recommendations: Continue using CPAP nightly  Graded exercise as tolerated  Follow-up a year from now  Encouraged to give us  a call with significant concerns      Jennet Epley MD [facility] 10/10/2023, 9:16 AM  CC: Margaret Margaret [name], MD

## 2023-10-13 ENCOUNTER — Ambulatory Visit: Admitting: Physical Therapy

## 2023-10-13 DIAGNOSIS — M6281 Muscle weakness (generalized): Secondary | ICD-10-CM | POA: Diagnosis not present

## 2023-10-13 DIAGNOSIS — M5417 Radiculopathy, lumbosacral region: Secondary | ICD-10-CM | POA: Diagnosis not present

## 2023-10-13 DIAGNOSIS — M25552 Pain in left hip: Secondary | ICD-10-CM | POA: Diagnosis not present

## 2023-10-13 DIAGNOSIS — M25551 Pain in right hip: Secondary | ICD-10-CM

## 2023-10-13 NOTE — Therapy (Signed)
 OUTPATIENT PHYSICAL THERAPY THORACOLUMBAR TREATMENT    Patient Name: Margaret Reese MRN: [id number] DOB:1945/06/30, 78 y.o., female Today's Date: 10/13/2023  END OF SESSION:  PT End of Session - 10/13/23 0924     Visit Number 8    Date for PT Re-Evaluation 11/14/23    Authorization Type Medicare    PT Start Time 0930    PT Stop Time 1015    PT Time Calculation (min) 45 min            Past Medical History:  Diagnosis Date   Allergy    Anxiety    Arthritis    Asthma    Complication of anesthesia    pt states has difficulty awakening; also has increased sinus drainage   Constipation    Edema, lower extremity    Encounter for interrogation of cardiac pacemaker 09/05/2018   Falls    GERD (gastroesophageal reflux disease)    H/O back injury    History of bronchitis    History of colon polyps    Hypertension    Imbalance    Kidney cysts    pt states not sure which kidney does see kidney specialist yearly pt states every thing okay currently    Legally blind in right eye, as defined in [place]     Mobitz type II atrioventricular block 11/08/2016   Multiple gastric ulcers    Obesity    OSA (obstructive sleep apnea) 05/03/2022   Pacemaker S/P [employer] Jude [employer]urity MRI model [id number] 05/20/2016   Renal stone    Retinal vein occlusion    Shortness of breath    Sinus node dysfunction (HCC) 12/16/2018   Tingling    left arm    Trace cataracts    Urinary frequency    Vitamin D  deficiency    Past Surgical History:  Procedure Laterality Date   CARDIOVASCULAR STRESS TEST  dec 2016   CHOLECYSTECTOMY     COLONOSCOPY  2011   EYE SURGERY Bilateral    Cataract surgery.    LEFT HEART CATH AND CORONARY ANGIOGRAPHY N/A 05/19/2016   Procedure: Left Heart Cath and Coronary Angiography;  Surgeon: Salena Negri, MD;  Location: [facility];  Service: Cardiovascular;  Laterality: N/A;   PACEMAKER IMPLANT N/A 05/20/2016   Procedure: Pacemaker Implant;  Surgeon: Will Gladis Norton,  MD;  Location: [facility];  Service: Cardiovascular;  Laterality: N/A;   TONSILLECTOMY     TOTAL HIP ARTHROPLASTY Right 01/30/2015   Procedure: RIGHT TOTAL HIP ARTHROPLASTY ANTERIOR APPROACH AND REMOVAL LIPOMA RIGHT HIP;  Surgeon: Lonni CINDERELLA Poli, MD;  Location: [facility];  Service: Orthopedics;  Laterality: Right;   UPPER GI ENDOSCOPY     Patient Active Problem List   Diagnosis Date Noted   Back pain 12/26/2022   Fluid retention in legs 11/13/2022   Stress 06/14/2022   Obesity (HCC)- Start BMI 40.06 05/12/2022   OSA (obstructive sleep apnea) 05/03/2022   Hyperglycemia 03/15/2022   Heart block AV complete (HCC) 01/05/2022   Pulmonary hypertension (HCC) 11/19/2021   Chronic diastolic (congestive) heart failure (HCC) 06/01/2020   Pacemaker S/P St Jude Medical Assurity MRI model [id number] 05/20/2016   History of total right hip arthroplasty 01/30/2015   Arthritis 09/11/2014   Renal cyst 09/04/2014   Allergic rhinitis due to pollen 09/04/2014   Essential hypertension 07/08/2010   GERD (gastroesophageal reflux disease) 07/08/2010   Calcium oxalate renal stones 07/08/2010   Hyperlipidemia 07/08/2010    PCP: Norleen Jobs  REFERRING  PROVIDER: Lonni Poli  REFERRING DIAG:  [phone] (ICD-10-CM) - History of total right hip arthroplasty  M54.41,G89.29 (ICD-10-CM) - Chronic right-sided low back pain with right-sided sciatica    Rationale for Evaluation and Treatment: Rehabilitation  THERAPY DIAG:  Radiculopathy, lumbosacral region  Bilateral hip pain  Muscle weakness (generalized)  ONSET DATE: 08/16/23 referral  SUBJECTIVE:                                                                                                                                                                                           SUBJECTIVE STATEMENT: overall feeling better. STW and stretching after last session and at home is really helping  PERTINENT HISTORY:  Patient is a  78 year old female who is almost 9 years out from a right total hip arthroplasty.  She comes in today ambulating with a cane.  She has been having a hard time getting up from a seated position and up from the floor when she is down on the floor.  She reports sciatic pain on the right side and also some left lower extremity and right lower extremity radicular types of symptoms.  She is not a diabetic.  She takes anti-inflammatories over-the-counter on a rare basis.  She denies any change in bowel or bladder function.   On exam both hips move smoothly and fluidly with no blocks or rotation.  Most of her pain seems to be with positive straight leg raise on both sides more so on the right than the left but she does have left lower extremity radicular symptoms as well.  PAIN:  Are you having pain? Yes: NPRS scale: 4/10 Pain location: both hips, into legs, some pain in low back Pain description: becoming more frequent and intense, electrical shock, achy  Aggravating factors: bending, standing too long Relieving factors: nothing really  PRECAUTIONS: None  RED FLAGS: None   WEIGHT BEARING RESTRICTIONS: No  FALLS:  Has patient fallen in last 6 months? No  LIVING ENVIRONMENT: Lives with: lives with their spouse Lives in: House/apartment Stairs: Yes: External: 5 steps; bilateral but cannot reach both Has following equipment at home: Single point cane and Walker - 2 wheeled  PLOF: Independent and Independent with basic ADLs  PATIENT GOALS: to not hurt and I don't want to risk falling  NEXT MD VISIT: 09/20/23  OBJECTIVE:  Note: Objective measures were completed at Evaluation unless otherwise noted.  DIAGNOSTIC FINDINGS:  2 views of the lumbar spine show a significant grade 1 spondylolisthesis at L4-L5 with some degenerative changes as well. An AP pelvis and lateral of the right hip shows a well-seated right total hip arthroplasty. There is  moderate arthritis of the left hip.     COGNITION: Overall cognitive status: Within functional limits for tasks assessed     SENSATION: WFL   POSTURE: rounded shoulders and forward head  PALPATION: Some tenderness in L2-L5  LUMBAR ROM:   AROM eval 09/28/23 10/03/23  Flexion Can touch toes, painful coming back up [facility] WFLs  Extension 25% with pain 25# with pain Limited 75%  Right lateral flexion Mid thigh with pain Limited 25% Limited 25%  Left lateral flexion Fibular head no pain Limited 25% Limited 25%  Right rotation 25% [facility] WFL  Left rotation 50% WFL WFL   (Blank rows = not tested)  LOWER EXTREMITY ROM:   grossly within functional ranges   LOWER EXTREMITY MMT:  grossly 5/5, some pain with resisted knee extension on RLE, hip extension 4/5  LUMBAR SPECIAL TESTS:  Straight leg raise test: Positive and FABER test: Positive  FUNCTIONAL TESTS:  5 times sit to stand: 10.39s Timed up and go (TUG): 15.53s w/o cane  GAIT: Distance walked: in clinic distances Assistive device utilized: Single point cane Level of assistance: Complete Independence  TREATMENT DATE:  10/13/22\5 Green tband seated - HS curl , hip flex and clams 2 sets 10 Seated 3 # LAQ 2 sets 10 BIl Seated 3# ER/IR 10 x BIL HHA 3# marching fwd and back 15 feet 2 x HHA 3# side stepping 15 feet 2x 3# ankle wt alt step tap 6 inch 20 x with SPC 2 sets 3# ankle step tap laterally 6 inch 10 x each STS on airex CGA 10 x Foam beam in // bars tandem fwd and back and side stepping Gentle STW RT thigh/hip flex and left ITB Gentle PROM LE and trunk- cuing to relax  10/05/23 PROM LE and trunk  STW RT hip flex and quad on a stretch STW to Left ITB on stretch Pelvic ROM and stab ex on dyna disc 4 in step up fwd with opp hip ext 10 x with UE 4 in step up with opp hip abd x 10 with UE Seated trunk flex and ext 2 sets 10 blue tband Educ in hip flex stretch off bed and adding rolling to RT thigh  10/03/23 PROM LE and trunk STW to distal ITB Feet on ball  bridge,KTC and obl Green tband clams BIL 10 x then SL 10 x Green tband marching 10 x BIL  2 sets ADD ball squeeze 10 x then 10x with bridge SLR 10x BIL then SLR with abd 10 x 3# LAQ 20 x alt 3# hip flex 20 x alt 3# standing hip flex,ext and abd 10 x each  6in 3# step tap with SPC fwd and laterally 10 x each  09/28/23  Feet on ball bridge,KTC and obl- left knee catch with KTC, cuing with rotation to not twist so much Isometric abdominals 3 sec hold PROM LE and trunk  Left ITB stretching and STW- very tender. Instructed in rolling at home and stretching SL hip 3 way 10 x BIL In // bars on foam roll 3 x  laterally ADD ball squeeze 15 x Clams 2 sets 10 STS with wt ball OH 10 x    09/25/23 Walking 2 laps with SPC   Supine LTR, HS stretch, SKTC Feet on pball rotations and knees to chest  Ball squeeze 2x10 Bridges 2x10 STS 2x10 Calf stretch  Standing rows and ext red band 2x10  09/21/23  Declined Nustep- last time my pacemaker was really revving up and  I was very concerned and surprised, I don't need that stress today   Spent some time discussing and educating on prior imaging findings (spondylolisthesis, hip OA, etc) and how it is possibly affecting (or not affecting) current pain levels   Supine hip flexor stretch 2x30 seconds B Supine frog stretch 2x30 seconds Piriformis stretch 2x30 seconds B  HS/gastroc stretch 2x30 seconds B  Seated TA set 15x3 seconds Seated TA set + mini-march x12 Seated TA set + opposite UE/LE flexion x10B STS x10 no UEs cues for eccentric lower     09/14/23 Nustep L 5 LE only at 4 1/2 min decreased resistance to 3 Black tband trunk ext 2 sets 10 ADD ball squeeze 2 sets 10 Clams red tband 2 sets 10  Red tband hip flex 2 sets 10 Iso abdominals 2 set s 10 Wt ball chest press then obl 10 x each seated 3# alt LAQ 20 x 3 # hip abd with flexion 10 x BIL 2 sets  Standing 3# hip ext and abd 10 x Legs on ball bridge 2 sets 10 Legs on ball obl  20 x     09/05/23 EVAL                                                                                                                                PATIENT EDUCATION:  Education details: POC, HEP, sciatica, hip flexor tightness sleeping in recliners Person educated: Patient Education method: Medical illustrator Education comprehension: verbalized understanding and returned demonstration  HOME EXERCISE PROGRAM: Access Code: [email or web address] URL: [email or web address] Date: 10/03/2023 Prepared by: Lyal Husted  Exercises - Supine Bridge  - 1 x daily - 7 x weekly - 1 sets - 10 reps - 3 hold - Supine Hip Adduction Isometric with Ball  - 1 x daily - 7 x weekly - 2 sets - 10 reps - Supine March  - 1 x daily - 7 x weekly - 2 sets - 10 reps - Hooklying Clamshell with Resistance  - 1 x daily - 7 x weekly - 2 sets - 10 reps - Supine Straight Leg Raises  - 1 x daily - 7 x weekly - 3 sets - 10 reps - Seated Long Arc Quad  - 1 x daily - 7 x weekly - 2 sets - 10 reps - Seated March  - 1 x daily - 7 x weekly - 2 sets - 10 reps - Seated Hip Flexion and External Rotation  - 1 x daily - 7 x weekly - 2 sets - 10 reps - Standing March with Counter Support  - 1 x daily - 7 x weekly - 1 sets - 10 reps - Standing Hip Abduction with Counter Support  - 1 x daily - 7 x weekly - 1 sets - 10 reps - Standing Hip Extension with Counter Support  - 1 x daily - 7 x weekly - 1 sets - 10 reps Access  Code: [email or web address] URL: [email or web address] Date: 09/05/2023 Prepared by: Almetta Fam  Exercises - Supine Lower Trunk Rotation  - 1 x daily - 7 x weekly - 2 sets - 10 reps - Supine Bridge  - 1 x daily - 7 x weekly - 2 sets - 10 reps - Supine Figure 4 Piriformis Stretch  - 1 x daily - 7 x weekly - 2 reps - 15 hold - Modified Thomas Stretch  - 1 x daily - 7 x weekly - 2 reps - 15 hold  ASSESSMENT:  CLINICAL IMPRESSION:  pt arrived feeling better. States doing HEP and its helping.  Progressed ex and stretching with cuing. Pt struggling with hip flexion ie lifting legs and steps d/t weakness. Educ on slow progression as to not flar pt up and she VU     OBJECTIVE IMPAIRMENTS: Abnormal gait, decreased activity tolerance, decreased balance, decreased ROM, decreased strength, and pain.   ACTIVITY LIMITATIONS: bending, squatting, stairs, and locomotion level  PARTICIPATION LIMITATIONS: cleaning, laundry, driving, shopping, community activity, and yard work  PERSONAL FACTORS: Age, Past/current experiences, Time since onset of injury/illness/exacerbation, and 1-2 comorbidities: arthritis, hx of surgery are also affecting patient's functional outcome.   REHAB POTENTIAL: Good  CLINICAL DECISION MAKING: Stable/uncomplicated  EVALUATION COMPLEXITY: Low  GOALS: Goals reviewed with patient? Yes  SHORT TERM GOALS: Target date: 10/10/23  Patient will be independent with initial HEP.  Baseline:  Goal status: 09/14/23 progressing  09/28/23 MET  2.  Patient will report centralization of radicular symptoms.  Baseline: pain in both legs Goal status: 09/28/23 no changes   10/03/23 progressing   LONG TERM GOALS: Target date: 11/14/23  Patient will be independent with advanced/ongoing HEP to improve outcomes and carryover.  Baseline:  Goal status: INITIAL  2.  Patient will report 50-75% improvement in low back pain to improve QOL.  Baseline: 5/10 Goal status: 10/03/23 progressing  3.  Patient will demonstrate full pain free lumbar ROM to perform ADLs.   Baseline: see chart Goal status: 10/03/23 progressing  4.  Patient will be up and down a set of stairs without pain.  Baseline: pain going up and down Goal status: ongoing 10/03/23  5.  Patient will be able to sleep in her bed again for at least 3-4 hrs.  Baseline: 1 hr tolerance  Goal status: 10/03/23 progressing  6.  Patient will tolerate 30 min of standing to perform ADLs and household chores Baseline: ~10 mins Goal  status: 10/03/23 progressing  PLAN:  PT FREQUENCY: 2x/week  PT DURATION: 10 weeks  PLANNED INTERVENTIONS: 97110-Therapeutic exercises, 97530- Therapeutic activity, 97112- Neuromuscular re-education, 97535- Self Care, [id number]- Manual therapy, [phone]- Gait training, [phone]- Electrical stimulation (unattended), [id number]- Vasopneumatic device, [id number]- Traction (mechanical), [phone] (1-2 muscles), [phone] (3+ muscles)- Dry Needling, Patient/Family education, Balance training, Stair training, Taping, Joint mobilization, Spinal mobilization, Cryotherapy, and Moist heat.  PLAN FOR NEXT SESSION: assess and progress Jon Or PTA 10/13/23 10:17 AM

## 2023-10-17 ENCOUNTER — Ambulatory Visit (INDEPENDENT_AMBULATORY_CARE_PROVIDER_SITE_OTHER): Payer: Medicare Other

## 2023-10-17 DIAGNOSIS — I441 Atrioventricular block, second degree: Secondary | ICD-10-CM | POA: Diagnosis not present

## 2023-10-18 ENCOUNTER — Ambulatory Visit: Payer: Self-pay | Admitting: Cardiology

## 2023-10-18 LAB — CUP PACEART REMOTE DEVICE CHECK
Battery Remaining Longevity: 24 mo
Battery Remaining Percentage: 25 %
Battery Voltage: 2.92 V
Brady Statistic AP VP Percent: 40 %
Brady Statistic AP VS Percent: 1 %
Brady Statistic AS VP Percent: 60 %
Brady Statistic AS VS Percent: 1 %
Brady Statistic RA Percent Paced: 40 %
Brady Statistic RV Percent Paced: 99 %
Date Time Interrogation Session: 20250826020013
Implantable Lead Connection Status: 753985
Implantable Lead Connection Status: 753985
Implantable Lead Implant Date: 20180330
Implantable Lead Implant Date: 20180330
Implantable Lead Location: 753859
Implantable Lead Location: 753860
Implantable Pulse Generator Implant Date: 20180330
Lead Channel Impedance Value: 390 Ohm
Lead Channel Impedance Value: 460 Ohm
Lead Channel Pacing Threshold Amplitude: 0.75 V
Lead Channel Pacing Threshold Amplitude: 0.75 V
Lead Channel Pacing Threshold Pulse Width: 0.5 ms
Lead Channel Pacing Threshold Pulse Width: 0.5 ms
Lead Channel Sensing Intrinsic Amplitude: 10.3 mV
Lead Channel Sensing Intrinsic Amplitude: 4.1 mV
Lead Channel Setting Pacing Amplitude: 2 V
Lead Channel Setting Pacing Amplitude: 2.5 V
Lead Channel Setting Pacing Pulse Width: 0.5 ms
Lead Channel Setting Sensing Sensitivity: 4 mV
Pulse Gen Model: 2272
Pulse Gen Serial Number: 7998534

## 2023-10-20 ENCOUNTER — Ambulatory Visit: Admitting: Physical Therapy

## 2023-10-20 DIAGNOSIS — M25551 Pain in right hip: Secondary | ICD-10-CM

## 2023-10-20 DIAGNOSIS — M25552 Pain in left hip: Secondary | ICD-10-CM | POA: Diagnosis not present

## 2023-10-20 DIAGNOSIS — M5417 Radiculopathy, lumbosacral region: Secondary | ICD-10-CM | POA: Diagnosis not present

## 2023-10-20 DIAGNOSIS — M6281 Muscle weakness (generalized): Secondary | ICD-10-CM

## 2023-10-20 NOTE — Therapy (Signed)
 OUTPATIENT PHYSICAL THERAPY THORACOLUMBAR TREATMENT    Patient Name: Margaret Reese MRN: [id number] DOB:Oct 08, 1945, 78 y.o., female Today's Date: 10/20/2023  END OF SESSION:  PT End of Session - 10/20/23 0911     Visit Number 9    Date for PT Re-Evaluation 11/14/23    Authorization Type Medicare    PT Start Time 0915    PT Stop Time 1000    PT Time Calculation (min) 45 min            Past Medical History:  Diagnosis Date   Allergy    Anxiety    Arthritis    Asthma    Complication of anesthesia    pt states has difficulty awakening; also has increased sinus drainage   Constipation    Edema, lower extremity    Encounter for interrogation of cardiac pacemaker 09/05/2018   Falls    GERD (gastroesophageal reflux disease)    H/O back injury    History of bronchitis    History of colon polyps    Hypertension    Imbalance    Kidney cysts    pt states not sure which kidney does see kidney specialist yearly pt states every thing okay currently    Legally blind in right eye, as defined in [place]     Mobitz type II atrioventricular block 11/08/2016   Multiple gastric ulcers    Obesity    OSA (obstructive sleep apnea) 05/03/2022   Pacemaker S/P St Jude [employer]urity MRI model [id number] 05/20/2016   Renal stone    Retinal vein occlusion    Shortness of breath    Sinus node dysfunction (HCC) 12/16/2018   Tingling    left arm    Trace cataracts    Urinary frequency    Vitamin D  deficiency    Past Surgical History:  Procedure Laterality Date   CARDIOVASCULAR STRESS TEST  dec 2016   CHOLECYSTECTOMY     COLONOSCOPY  2011   EYE SURGERY Bilateral    Cataract surgery.    LEFT HEART CATH AND CORONARY ANGIOGRAPHY N/A 05/19/2016   Procedure: Left Heart Cath and Coronary Angiography;  Surgeon: Salena Negri, MD;  Location: [facility];  Service: Cardiovascular;  Laterality: N/A;   PACEMAKER IMPLANT N/A 05/20/2016   Procedure: Pacemaker Implant;  Surgeon: Will Gladis Norton,  MD;  Location: [facility];  Service: Cardiovascular;  Laterality: N/A;   TONSILLECTOMY     TOTAL HIP ARTHROPLASTY Right 01/30/2015   Procedure: RIGHT TOTAL HIP ARTHROPLASTY ANTERIOR APPROACH AND REMOVAL LIPOMA RIGHT HIP;  Surgeon: Lonni CINDERELLA Poli, MD;  Location: [facility];  Service: Orthopedics;  Laterality: Right;   UPPER GI ENDOSCOPY     Patient Active Problem List   Diagnosis Date Noted   Back pain 12/26/2022   Fluid retention in legs 11/13/2022   Stress 06/14/2022   Obesity (HCC)- Start BMI 40.06 05/12/2022   OSA (obstructive sleep apnea) 05/03/2022   Hyperglycemia 03/15/2022   Heart block AV complete (HCC) 01/05/2022   Pulmonary hypertension (HCC) 11/19/2021   Chronic diastolic (congestive) heart failure (HCC) 06/01/2020   Pacemaker S/P St Jude Medical Assurity MRI model [id number] 05/20/2016   History of total right hip arthroplasty 01/30/2015   Arthritis 09/11/2014   Renal cyst 09/04/2014   Allergic rhinitis due to pollen 09/04/2014   Essential hypertension 07/08/2010   GERD (gastroesophageal reflux disease) 07/08/2010   Calcium oxalate renal stones 07/08/2010   Hyperlipidemia 07/08/2010    PCP: Norleen Jobs  REFERRING  PROVIDER: Lonni Poli  REFERRING DIAG:  [phone] (ICD-10-CM) - History of total right hip arthroplasty  M54.41,G89.29 (ICD-10-CM) - Chronic right-sided low back pain with right-sided sciatica    Rationale for Evaluation and Treatment: Rehabilitation  THERAPY DIAG:  Radiculopathy, lumbosacral region  Bilateral hip pain  Muscle weakness (generalized)  ONSET DATE: 08/16/23 referral  SUBJECTIVE:                                                                                                                                                                                           SUBJECTIVE STATEMENT:   Back pain comes and goes. Other stuff 40% better. Still rolling at home and doing HEP. Going to reg ex class but Wed had to stop- legs  heavy and weak. I feel like I am walking better . Would like to do steps better without pulling with UE and step too.  PERTINENT HISTORY:  Patient is a 78 year old female who is almost 9 years out from a right total hip arthroplasty.  She comes in today ambulating with a cane.  She has been having a hard time getting up from a seated position and up from the floor when she is down on the floor.  She reports sciatic pain on the right side and also some left lower extremity and right lower extremity radicular types of symptoms.  She is not a diabetic.  She takes anti-inflammatories over-the-counter on a rare basis.  She denies any change in bowel or bladder function.   On exam both hips move smoothly and fluidly with no blocks or rotation.  Most of her pain seems to be with positive straight leg raise on both sides more so on the right than the left but she does have left lower extremity radicular symptoms as well.  PAIN:  Are you having pain? Yes: NPRS scale: 4/10 Pain location: both hips, into legs, some pain in low back Pain description: becoming more frequent and intense, electrical shock, achy  Aggravating factors: bending, standing too long Relieving factors: nothing really  PRECAUTIONS: None  RED FLAGS: None   WEIGHT BEARING RESTRICTIONS: No  FALLS:  Has patient fallen in last 6 months? No  LIVING ENVIRONMENT: Lives with: lives with their spouse Lives in: House/apartment Stairs: Yes: External: 5 steps; bilateral but cannot reach both Has following equipment at home: Single point cane and Walker - 2 wheeled  PLOF: Independent and Independent with basic ADLs  PATIENT GOALS: to not hurt and I don't want to risk falling  NEXT MD VISIT: 09/20/23  OBJECTIVE:  Note: Objective measures were completed at Evaluation unless otherwise noted.  DIAGNOSTIC FINDINGS:  2 views of the lumbar spine show a significant grade 1 spondylolisthesis at L4-L5 with some degenerative changes as well.  An AP pelvis and lateral of the right hip shows a well-seated right total hip arthroplasty. There is moderate arthritis of the left hip.    COGNITION: Overall cognitive status: Within functional limits for tasks assessed     SENSATION: WFL   POSTURE: rounded shoulders and forward head  PALPATION: Some tenderness in L2-L5  LUMBAR ROM:   AROM eval 09/28/23 10/03/23 10/20/23  Flexion Can touch toes, painful coming back up [facility] WFLS  Extension 25% with pain 25# with pain Limited 75% Limited 25%  Right lateral flexion Mid thigh with pain Limited 25% Limited 25% Limited 25%  Left lateral flexion Fibular head no pain Limited 25% Limited 25% WFLs  Right rotation 25% [facility] WFL   Left rotation 50% WFL WFL    (Blank rows = not tested)  LOWER EXTREMITY ROM:   grossly within functional ranges   LOWER EXTREMITY MMT:  grossly 5/5, some pain with resisted knee extension on RLE, hip extension 4/5  LUMBAR SPECIAL TESTS:  Straight leg raise test: Positive and FABER test: Positive  FUNCTIONAL TESTS:  5 times sit to stand: 10.39s Timed up and go (TUG): 15.53s w/o cane  GAIT: Distance walked: in clinic distances Assistive device utilized: Single point cane Level of assistance: Complete Independence  TREATMENT DATE:  10/20/23 Assessed ROM and goals Nustep L 3 Step over step 2 rail 4 inch 4 x Step over step 2 rail 6 inch 3 x Step up 5 x each leg 6 inch 1 rail Step down 5 x each leg 6 inch 2 rail- RT leg weaker Leg press 20# 3 sets 10 20# resisted gait 5 x fwd and back, 3 x each side STS with yellow wt ball 10 x HS curl 20# 2 sets 10 PROM LE and trunk with STW to RT quad/hip flexor and Left ITB  10/13/22\5 Green tband seated - HS curl , hip flex and clams 2 sets 10 Seated 3 # LAQ 2 sets 10 BIl Seated 3# ER/IR 10 x BIL HHA 3# marching fwd and back 15 feet 2 x HHA 3# side stepping 15 feet 2x 3# ankle wt alt step tap 6 inch 20 x with SPC 2 sets 3# ankle step tap laterally 6  inch 10 x each STS on airex CGA 10 x Foam beam in // bars tandem fwd and back and side stepping Gentle STW RT thigh/hip flex and left ITB Gentle PROM LE and trunk- cuing to relax  10/05/23 PROM LE and trunk  STW RT hip flex and quad on a stretch STW to Left ITB on stretch Pelvic ROM and stab ex on dyna disc 4 in step up fwd with opp hip ext 10 x with UE 4 in step up with opp hip abd x 10 with UE Seated trunk flex and ext 2 sets 10 blue tband Educ in hip flex stretch off bed and adding rolling to RT thigh  10/03/23 PROM LE and trunk STW to distal ITB Feet on ball bridge,KTC and obl Green tband clams BIL 10 x then SL 10 x Green tband marching 10 x BIL  2 sets ADD ball squeeze 10 x then 10x with bridge SLR 10x BIL then SLR with abd 10 x 3# LAQ 20 x alt 3# hip flex 20 x alt 3# standing hip flex,ext and abd 10 x each  6in 3# step tap with  SPC fwd and laterally 10 x each  09/28/23  Feet on ball bridge,KTC and obl- left knee catch with KTC, cuing with rotation to not twist so much Isometric abdominals 3 sec hold PROM LE and trunk  Left ITB stretching and STW- very tender. Instructed in rolling at home and stretching SL hip 3 way 10 x BIL In // bars on foam roll 3 x  laterally ADD ball squeeze 15 x Clams 2 sets 10 STS with wt ball OH 10 x    09/25/23 Walking 2 laps with SPC   Supine LTR, HS stretch, SKTC Feet on pball rotations and knees to chest  Ball squeeze 2x10 Bridges 2x10 STS 2x10 Calf stretch  Standing rows and ext red band 2x10  09/21/23  Declined Nustep- last time my pacemaker was really revving up and I was very concerned and surprised, I don't need that stress today   Spent some time discussing and educating on prior imaging findings (spondylolisthesis, hip OA, etc) and how it is possibly affecting (or not affecting) current pain levels   Supine hip flexor stretch 2x30 seconds B Supine frog stretch 2x30 seconds Piriformis stretch 2x30 seconds B  HS/gastroc  stretch 2x30 seconds B  Seated TA set 15x3 seconds Seated TA set + mini-march x12 Seated TA set + opposite UE/LE flexion x10B STS x10 no UEs cues for eccentric lower     09/14/23 Nustep L 5 LE only at 4 1/2 min decreased resistance to 3 Black tband trunk ext 2 sets 10 ADD ball squeeze 2 sets 10 Clams red tband 2 sets 10  Red tband hip flex 2 sets 10 Iso abdominals 2 set s 10 Wt ball chest press then obl 10 x each seated 3# alt LAQ 20 x 3 # hip abd with flexion 10 x BIL 2 sets  Standing 3# hip ext and abd 10 x Legs on ball bridge 2 sets 10 Legs on ball obl 20 x     09/05/23 EVAL                                                                                                                                PATIENT EDUCATION:  Education details: POC, HEP, sciatica, hip flexor tightness sleeping in recliners Person educated: Patient Education method: Medical illustrator Education comprehension: verbalized understanding and returned demonstration  HOME EXERCISE PROGRAM: Access Code: [email or web address] URL: [email or web address] Date: 10/03/2023 Prepared by: Newell Wafer  Exercises - Supine Bridge  - 1 x daily - 7 x weekly - 1 sets - 10 reps - 3 hold - Supine Hip Adduction Isometric with Ball  - 1 x daily - 7 x weekly - 2 sets - 10 reps - Supine March  - 1 x daily - 7 x weekly - 2 sets - 10 reps - Hooklying Clamshell with Resistance  - 1 x daily - 7 x weekly - 2 sets - 10 reps - Supine Straight Leg  Raises  - 1 x daily - 7 x weekly - 3 sets - 10 reps - Seated Long Arc Quad  - 1 x daily - 7 x weekly - 2 sets - 10 reps - Seated March  - 1 x daily - 7 x weekly - 2 sets - 10 reps - Seated Hip Flexion and External Rotation  - 1 x daily - 7 x weekly - 2 sets - 10 reps - Standing March with Counter Support  - 1 x daily - 7 x weekly - 1 sets - 10 reps - Standing Hip Abduction with Counter Support  - 1 x daily - 7 x weekly - 1 sets - 10 reps - Standing Hip Extension  with Counter Support  - 1 x daily - 7 x weekly - 1 sets - 10 reps Access Code: [email or web address] URL: [email or web address] Date: 09/05/2023 Prepared by: Almetta Fam  Exercises - Supine Lower Trunk Rotation  - 1 x daily - 7 x weekly - 2 sets - 10 reps - Supine Bridge  - 1 x daily - 7 x weekly - 2 sets - 10 reps - Supine Figure 4 Piriformis Stretch  - 1 x daily - 7 x weekly - 2 reps - 15 hold - Modified Thomas Stretch  - 1 x daily - 7 x weekly - 2 reps - 15 hold  ASSESSMENT:  CLINICAL IMPRESSION:  per pt Back pain comes and goes,Other stuff 40% better. Still rolling at home and doing HEP. Going to reg ex class but Wed had to stop- legs heavy and weak. I feel like I am walking better . Would like to do steps better without pulling with UE and step too- so we spent time working on steps and step ups with cuing for safety when doing steps. Progressed ex with cuing. Assessed goals and documented, making slow steady progress.   OBJECTIVE IMPAIRMENTS: Abnormal gait, decreased activity tolerance, decreased balance, decreased ROM, decreased strength, and pain.   ACTIVITY LIMITATIONS: bending, squatting, stairs, and locomotion level  PARTICIPATION LIMITATIONS: cleaning, laundry, driving, shopping, community activity, and yard work  PERSONAL FACTORS: Age, Past/current experiences, Time since onset of injury/illness/exacerbation, and 1-2 comorbidities: arthritis, hx of surgery are also affecting patient's functional outcome.   REHAB POTENTIAL: Good  CLINICAL DECISION MAKING: Stable/uncomplicated  EVALUATION COMPLEXITY: Low  GOALS: Goals reviewed with patient? Yes  SHORT TERM GOALS: Target date: 10/10/23  Patient will be independent with initial HEP.  Baseline:  Goal status: 09/14/23 progressing  09/28/23 MET  2.  Patient will report centralization of radicular symptoms.  Baseline: pain in both legs Goal status: 09/28/23 no changes   10/03/23 progressing   MET 10/20/23   LONG TERM GOALS:  Target date: 11/14/23  Patient will be independent with advanced/ongoing HEP to improve outcomes and carryover.  Baseline:  Goal status: MET 10/20/23 btwn HEP and ex class  2.  Patient will report 50-75% improvement in low back pain to improve QOL.  Baseline: 5/10 Goal status: 10/03/23 progressing  10/20/23 40% progressing  3.  Patient will demonstrate full pain free lumbar ROM to perform ADLs.   Baseline: see chart Goal status: 10/03/23 progressing  and 10/20/23  4.  Patient will be up and down a set of stairs without pain.  Baseline: pain going up and down Goal status: ongoing 10/03/23  progressing 10/20/23  5.  Patient will be able to sleep in her bed again for at least 3-4 hrs.  Baseline: 1 hr tolerance  Goal status: 10/03/23  progressing  and 10/20/23  6.  Patient will tolerate 30 min of standing to perform ADLs and household chores Baseline: ~10 mins Goal status: 10/03/23 progressing 10/20/23 progressing 15-20  PLAN:  PT FREQUENCY: 2x/week  PT DURATION: 10 weeks  PLANNED INTERVENTIONS: 97110-Therapeutic exercises, 97530- Therapeutic activity, 97112- Neuromuscular re-education, 97535- Self Care, 02859- Manual therapy, [phone]- Gait training, [phone]- Electrical stimulation (unattended), 97016- Vasopneumatic device, [id number]- Traction (mechanical), [phone] (1-2 muscles), 20561 (3+ muscles)- Dry Needling, Patient/Family education, Balance training, Stair training, Taping, Joint mobilization, Spinal mobilization, Cryotherapy, and Moist heat.  PLAN FOR NEXT SESSION: progress func strength Jon Jaber Dunlow PTA 10/20/23 9:12 AM

## 2023-10-24 ENCOUNTER — Ambulatory Visit: Attending: Family Medicine | Admitting: Physical Therapy

## 2023-10-24 ENCOUNTER — Telehealth (INDEPENDENT_AMBULATORY_CARE_PROVIDER_SITE_OTHER): Admitting: Psychology

## 2023-10-24 DIAGNOSIS — F32A Depression, unspecified: Secondary | ICD-10-CM | POA: Diagnosis not present

## 2023-10-24 DIAGNOSIS — F5089 Other specified eating disorder: Secondary | ICD-10-CM | POA: Diagnosis not present

## 2023-10-24 DIAGNOSIS — M25552 Pain in left hip: Secondary | ICD-10-CM | POA: Diagnosis not present

## 2023-10-24 DIAGNOSIS — M5417 Radiculopathy, lumbosacral region: Secondary | ICD-10-CM | POA: Insufficient documentation

## 2023-10-24 DIAGNOSIS — M25551 Pain in right hip: Secondary | ICD-10-CM | POA: Diagnosis not present

## 2023-10-24 DIAGNOSIS — F419 Anxiety disorder, unspecified: Secondary | ICD-10-CM

## 2023-10-24 DIAGNOSIS — M6281 Muscle weakness (generalized): Secondary | ICD-10-CM | POA: Insufficient documentation

## 2023-10-24 NOTE — Progress Notes (Signed)
  Office: [phone]  /  Fax: [phone]    Date: October 24, 2023  Appointment Start Time: 12:32pm Duration: 34 minutes Provider: Wyatt Fire, Psy.D. Type of Session: Individual Therapy  Location of Patient: Home (private location) Location of Provider: Provider's Home (private office) Type of Contact: Telepsychological Visit via MyChart Video Visit  Session Content: Margaret Reese is a 78 y.o. female presenting for a follow-up appointment to address the previously established treatment goal of increasing coping skills.Today's appointment was a telepsychological visit. [name] provided verbal consent for today's telepsychological appointment and she is aware she is responsible for securing confidentiality on her end of the session. Prior to proceeding with today's appointment, [name]'s physical location at the time of this appointment was obtained as well a phone number she could be reached at in the event of technical difficulties. [name] and this provider participated in today's telepsychological service.   This provider conducted a brief check-in. [name] shared she continues to attend physical therapy, noting an improvement in pain level overall. While there has been an improvement in mobility and pain, she disclosed her eating habits continue to be a challenge. Reviewed mindfulness, specifically body scans. She stated she tried shared videos, noting challenges with duration. Associated thoughts and feelings were further explored and processed. [name] indicated she was often thinking about the next thing to do, feeling she had to address distractions, or wondering if she was doing it correctly, which resulted in unpleasant experiences. She also felt challenges with focusing on her breathing and found it difficult to determine the point. Psychoeducation provided regarding the breath serving as an anchor. Discussed the impact of what if scenarios and recent pain-related challenges (e.g., decreased  mobility) on engagement in emotional eating behaviors. Remainder of today's appointment focused on reviewing 5-4-3-2-1 exercise to assist with cultivating mindfulness and assist with grounding. Her experience was processed. Reviewed utilization of this exercise to help with coping with emotional eating behaviors. Overall, [name] was receptive to today's appointment as evidenced by openness to sharing, responsiveness to feedback, and willingness to implement discussed strategies .  Mental Status Examination:  Appearance: neat Behavior: appropriate to circumstances Mood: neutral Affect: mood congruent Speech: WNL Eye Contact: appropriate Psychomotor Activity: WNL Gait: unable to assess Thought Process: linear, logical, and goal directed and no evidence or endorsement of suicidal, homicidal, and self-harm ideation, plan and intent  Thought Content/Perception: no hallucinations, delusions, bizarre thinking or behavior endorsed or observed Orientation: AAOx4 Memory/Concentration: intact Insight: fair Judgment: fair  Interventions:  Conducted a brief chart review Provided empathic reflections and validation Reviewed content from the previous session Provided positive reinforcement Employed supportive psychotherapy interventions to facilitate reduced distress and to improve coping skills with identified stressors Engaged patient in mindfulness exercise(s)  DSM-5 Diagnosis(es): F50.89 Other Specified Feeding or Eating Disorder, Emotional Eating Behaviors, F41.9 Unspecified Anxiety Disorder, and  F32.A Unspecified Depressive Disorder  Treatment Goal & Progress: During the initial appointment with this provider, the following treatment goal was established: increase coping skills. [name] has demonstrated progress in her goal as evidenced by increased awareness of hunger patterns. [name] also continues to demonstrate willingness to engage in learned skills (e.g., journaling, mindfulness).   Plan: The  next appointment is scheduled for 11/06/2023 at 12:30pm, which will be via MyChart Video Visit. The next session will focus on working towards the established treatment goal.   Wyatt Fire, PsyD

## 2023-10-24 NOTE — Therapy (Signed)
 OUTPATIENT PHYSICAL THERAPY THORACOLUMBAR TREATMENT   Progress Note Reporting Period 09/05/23 to 10/24/23  See note below for Objective Data and Assessment of Progress/Goals.     Patient Name: Margaret Reese MRN: [id number] DOB:29-Oct-1945, 78 y.o., female Today's Date: 10/24/2023  END OF SESSION:  PT End of Session - 10/24/23 0930     Visit Number 10    Date for PT Re-Evaluation 11/14/23    Authorization Type Medicare    PT Start Time 0930    PT Stop Time 1015    PT Time Calculation (min) 45 min            Past Medical History:  Diagnosis Date   Allergy    Anxiety    Arthritis    Asthma    Complication of anesthesia    pt states has difficulty awakening; also has increased sinus drainage   Constipation    Edema, lower extremity    Encounter for interrogation of cardiac pacemaker 09/05/2018   Falls    GERD (gastroesophageal reflux disease)    H/O back injury    History of bronchitis    History of colon polyps    Hypertension    Imbalance    Kidney cysts    pt states not sure which kidney does see kidney specialist yearly pt states every thing okay currently    Legally blind in right eye, as defined in [place]     Mobitz type II atrioventricular block 11/08/2016   Multiple gastric ulcers    Obesity    OSA (obstructive sleep apnea) 05/03/2022   Pacemaker S/P [employer] Jude [employer]urity MRI model [id number] 05/20/2016   Renal stone    Retinal vein occlusion    Shortness of breath    Sinus node dysfunction (HCC) 12/16/2018   Tingling    left arm    Trace cataracts    Urinary frequency    Vitamin D  deficiency    Past Surgical History:  Procedure Laterality Date   CARDIOVASCULAR STRESS TEST  dec 2016   CHOLECYSTECTOMY     COLONOSCOPY  2011   EYE SURGERY Bilateral    Cataract surgery.    LEFT HEART CATH AND CORONARY ANGIOGRAPHY N/A 05/19/2016   Procedure: Left Heart Cath and Coronary Angiography;  Surgeon: Salena Negri, MD;  Location: [facility];  Service:  Cardiovascular;  Laterality: N/A;   PACEMAKER IMPLANT N/A 05/20/2016   Procedure: Pacemaker Implant;  Surgeon: Will Gladis Norton, MD;  Location: [facility];  Service: Cardiovascular;  Laterality: N/A;   TONSILLECTOMY     TOTAL HIP ARTHROPLASTY Right 01/30/2015   Procedure: RIGHT TOTAL HIP ARTHROPLASTY ANTERIOR APPROACH AND REMOVAL LIPOMA RIGHT HIP;  Surgeon: Lonni CINDERELLA Poli, MD;  Location: [facility];  Service: Orthopedics;  Laterality: Right;   UPPER GI ENDOSCOPY     Patient Active Problem List   Diagnosis Date Noted   Back pain 12/26/2022   Fluid retention in legs 11/13/2022   Stress 06/14/2022   Obesity (HCC)- Start BMI 40.06 05/12/2022   OSA (obstructive sleep apnea) 05/03/2022   Hyperglycemia 03/15/2022   Heart block AV complete (HCC) 01/05/2022   Pulmonary hypertension (HCC) 11/19/2021   Chronic diastolic (congestive) heart failure (HCC) 06/01/2020   Pacemaker S/P St Jude Medical Assurity MRI model [id number] 05/20/2016   History of total right hip arthroplasty 01/30/2015   Arthritis 09/11/2014   Renal cyst 09/04/2014   Allergic rhinitis due to pollen 09/04/2014   Essential hypertension 07/08/2010   GERD (gastroesophageal reflux  disease) 07/08/2010   Calcium oxalate renal stones 07/08/2010   Hyperlipidemia 07/08/2010    PCP: Norleen Jobs  REFERRING PROVIDER: Lonni Poli  REFERRING DIAG:  [phone] (ICD-10-CM) - History of total right hip arthroplasty  M54.41,G89.29 (ICD-10-CM) - Chronic right-sided low back pain with right-sided sciatica    Rationale for Evaluation and Treatment: Rehabilitation  THERAPY DIAG:  Radiculopathy, lumbosacral region  Bilateral hip pain  Muscle weakness (generalized)  ONSET DATE: 08/16/23 referral  SUBJECTIVE:                                                                                                                                                                                           SUBJECTIVE STATEMENT: did  well after last session . Some pain but moving in the right direction. Stood 30-45 min at home getting ready for company. Less pull on steps but still step too  PERTINENT HISTORY:  Patient is a 78 year old female who is almost 9 years out from a right total hip arthroplasty.  She comes in today ambulating with a cane.  She has been having a hard time getting up from a seated position and up from the floor when she is down on the floor.  She reports sciatic pain on the right side and also some left lower extremity and right lower extremity radicular types of symptoms.  She is not a diabetic.  She takes anti-inflammatories over-the-counter on a rare basis.  She denies any change in bowel or bladder function.   On exam both hips move smoothly and fluidly with no blocks or rotation.  Most of her pain seems to be with positive straight leg raise on both sides more so on the right than the left but she does have left lower extremity radicular symptoms as well.  PAIN:  Are you having pain? Yes: NPRS scale: 4/10 Pain location: both hips, into legs, some pain in low back Pain description: becoming more frequent and intense, electrical shock, achy  Aggravating factors: bending, standing too long Relieving factors: nothing really  PRECAUTIONS: None  RED FLAGS: None   WEIGHT BEARING RESTRICTIONS: No  FALLS:  Has patient fallen in last 6 months? No  LIVING ENVIRONMENT: Lives with: lives with their spouse Lives in: House/apartment Stairs: Yes: External: 5 steps; bilateral but cannot reach both Has following equipment at home: Single point cane and Walker - 2 wheeled  PLOF: Independent and Independent with basic ADLs  PATIENT GOALS: to not hurt and I don't want to risk falling  NEXT MD VISIT: 09/20/23  OBJECTIVE:  Note: Objective measures were completed at Evaluation unless otherwise noted.  DIAGNOSTIC FINDINGS:  2  views of the lumbar spine show a significant grade 1 spondylolisthesis at  L4-L5 with some degenerative changes as well. An AP pelvis and lateral of the right hip shows a well-seated right total hip arthroplasty. There is moderate arthritis of the left hip.    COGNITION: Overall cognitive status: Within functional limits for tasks assessed     SENSATION: WFL   POSTURE: rounded shoulders and forward head  PALPATION: Some tenderness in L2-L5  LUMBAR ROM:   AROM eval 09/28/23 10/03/23 10/20/23  Flexion Can touch toes, painful coming back up [facility] WFLS  Extension 25% with pain 25# with pain Limited 75% Limited 25%  Right lateral flexion Mid thigh with pain Limited 25% Limited 25% Limited 25%  Left lateral flexion Fibular head no pain Limited 25% Limited 25% WFLs  Right rotation 25% [facility] WFL   Left rotation 50% WFL WFL    (Blank rows = not tested)  LOWER EXTREMITY ROM:   grossly within functional ranges   LOWER EXTREMITY MMT:  grossly 5/5, some pain with resisted knee extension on RLE, hip extension 4/5  LUMBAR SPECIAL TESTS:  Straight leg raise test: Positive and FABER test: Positive  FUNCTIONAL TESTS:  5 times sit to stand: 10.39s Timed up and go (TUG): 15.53s w/o cane  GAIT: Distance walked: in clinic distances Assistive device utilized: Single point cane Level of assistance: Complete Independence  TREATMENT DATE:  10/24/23 5# farmers carry 1 lap each hand. Pain with wt in left hand 5# lateral flexion 15 x each side STS with 5# chest press 10 Nustep L 3 Step up 6 inch 5 x each leg, light hand support then 5 x more from airex Standing on airex ball toss- no LOB Lateral stepping over foam roll on iarex 10 x min a with LOB-setpping left harder Lateral steps 5 x each way with rail 4 in and 6 in 3# stepping over and into 6 inch box 5 x fwd and latrally 3# 6 in step taps 20 x 2 sets CG-minA     10/20/23 Assessed ROM and goals Nustep L 3 Step over step 2 rail 4 inch 4 x Step over step 2 rail 6 inch 3 x Step up 5 x each leg 6  inch 1 rail Step down 5 x each leg 6 inch 2 rail- RT leg weaker Leg press 20# 3 sets 10 20# resisted gait 5 x fwd and back, 3 x each side STS with yellow wt ball 10 x HS curl 20# 2 sets 10 PROM LE and trunk with STW to RT quad/hip flexor and Left ITB  10/13/22\5 Green tband seated - HS curl , hip flex and clams 2 sets 10 Seated 3 # LAQ 2 sets 10 BIl Seated 3# ER/IR 10 x BIL HHA 3# marching fwd and back 15 feet 2 x HHA 3# side stepping 15 feet 2x 3# ankle wt alt step tap 6 inch 20 x with SPC 2 sets 3# ankle step tap laterally 6 inch 10 x each STS on airex CGA 10 x Foam beam in // bars tandem fwd and back and side stepping Gentle STW RT thigh/hip flex and left ITB Gentle PROM LE and trunk- cuing to relax  10/05/23 PROM LE and trunk  STW RT hip flex and quad on a stretch STW to Left ITB on stretch Pelvic ROM and stab ex on dyna disc 4 in step up fwd with opp hip ext 10 x with UE 4 in step up with  opp hip abd x 10 with UE Seated trunk flex and ext 2 sets 10 blue tband Educ in hip flex stretch off bed and adding rolling to RT thigh  10/03/23 PROM LE and trunk STW to distal ITB Feet on ball bridge,KTC and obl Green tband clams BIL 10 x then SL 10 x Green tband marching 10 x BIL  2 sets ADD ball squeeze 10 x then 10x with bridge SLR 10x BIL then SLR with abd 10 x 3# LAQ 20 x alt 3# hip flex 20 x alt 3# standing hip flex,ext and abd 10 x each  6in 3# step tap with SPC fwd and laterally 10 x each  09/28/23  Feet on ball bridge,KTC and obl- left knee catch with KTC, cuing with rotation to not twist so much Isometric abdominals 3 sec hold PROM LE and trunk  Left ITB stretching and STW- very tender. Instructed in rolling at home and stretching SL hip 3 way 10 x BIL In // bars on foam roll 3 x  laterally ADD ball squeeze 15 x Clams 2 sets 10 STS with wt ball OH 10 x    09/25/23 Walking 2 laps with SPC   Supine LTR, HS stretch, SKTC Feet on pball rotations and knees to chest   Ball squeeze 2x10 Bridges 2x10 STS 2x10 Calf stretch  Standing rows and ext red band 2x10  09/21/23  Declined Nustep- last time my pacemaker was really revving up and I was very concerned and surprised, I don't need that stress today   Spent some time discussing and educating on prior imaging findings (spondylolisthesis, hip OA, etc) and how it is possibly affecting (or not affecting) current pain levels   Supine hip flexor stretch 2x30 seconds B Supine frog stretch 2x30 seconds Piriformis stretch 2x30 seconds B  HS/gastroc stretch 2x30 seconds B  Seated TA set 15x3 seconds Seated TA set + mini-march x12 Seated TA set + opposite UE/LE flexion x10B STS x10 no UEs cues for eccentric lower     09/14/23 Nustep L 5 LE only at 4 1/2 min decreased resistance to 3 Black tband trunk ext 2 sets 10 ADD ball squeeze 2 sets 10 Clams red tband 2 sets 10  Red tband hip flex 2 sets 10 Iso abdominals 2 set s 10 Wt ball chest press then obl 10 x each seated 3# alt LAQ 20 x 3 # hip abd with flexion 10 x BIL 2 sets  Standing 3# hip ext and abd 10 x Legs on ball bridge 2 sets 10 Legs on ball obl 20 x     09/05/23 EVAL                                                                                                                                PATIENT EDUCATION:  Education details: POC, HEP, sciatica, hip flexor tightness sleeping in recliners Person educated: Patient Education method: Medical illustrator  Education comprehension: verbalized understanding and returned demonstration  HOME EXERCISE PROGRAM: Access Code: [email or web address] URL: [email or web address] Date: 10/03/2023 Prepared by: Bekah Igoe  Exercises - Supine Bridge  - 1 x daily - 7 x weekly - 1 sets - 10 reps - 3 hold - Supine Hip Adduction Isometric with Ball  - 1 x daily - 7 x weekly - 2 sets - 10 reps - Supine March  - 1 x daily - 7 x weekly - 2 sets - 10 reps - Hooklying Clamshell with  Resistance  - 1 x daily - 7 x weekly - 2 sets - 10 reps - Supine Straight Leg Raises  - 1 x daily - 7 x weekly - 3 sets - 10 reps - Seated Long Arc Quad  - 1 x daily - 7 x weekly - 2 sets - 10 reps - Seated March  - 1 x daily - 7 x weekly - 2 sets - 10 reps - Seated Hip Flexion and External Rotation  - 1 x daily - 7 x weekly - 2 sets - 10 reps - Standing March with Counter Support  - 1 x daily - 7 x weekly - 1 sets - 10 reps - Standing Hip Abduction with Counter Support  - 1 x daily - 7 x weekly - 1 sets - 10 reps - Standing Hip Extension with Counter Support  - 1 x daily - 7 x weekly - 1 sets - 10 reps Access Code: [email or web address] URL: [email or web address] Date: 09/05/2023 Prepared by: Almetta Fam  Exercises - Supine Lower Trunk Rotation  - 1 x daily - 7 x weekly - 2 sets - 10 reps - Supine Bridge  - 1 x daily - 7 x weekly - 2 sets - 10 reps - Supine Figure 4 Piriformis Stretch  - 1 x daily - 7 x weekly - 2 reps - 15 hold - Modified Thomas Stretch  - 1 x daily - 7 x weekly - 2 reps - 15 hold  ASSESSMENT:  CLINICAL IMPRESSION:  pt arrived feeling good after last session. States stood 30-45 min getting ready for company and doing steps with less pull,just step too. Pt has notably weaker left LE vs rt and instability with left SLS. Goals assessed-slow steady progress   OBJECTIVE IMPAIRMENTS: Abnormal gait, decreased activity tolerance, decreased balance, decreased ROM, decreased strength, and pain.   ACTIVITY LIMITATIONS: bending, squatting, stairs, and locomotion level  PARTICIPATION LIMITATIONS: cleaning, laundry, driving, shopping, community activity, and yard work  PERSONAL FACTORS: Age, Past/current experiences, Time since onset of injury/illness/exacerbation, and 1-2 comorbidities: arthritis, hx of surgery are also affecting patient's functional outcome.   REHAB POTENTIAL: Good  CLINICAL DECISION MAKING: Stable/uncomplicated  EVALUATION COMPLEXITY: Low  GOALS: Goals  reviewed with patient? Yes  SHORT TERM GOALS: Target date: 10/10/23  Patient will be independent with initial HEP.  Baseline:  Goal status: 09/14/23 progressing  09/28/23 MET  2.  Patient will report centralization of radicular symptoms.  Baseline: pain in both legs Goal status: 09/28/23 no changes   10/03/23 progressing   MET 10/20/23   LONG TERM GOALS: Target date: 11/14/23  Patient will be independent with advanced/ongoing HEP to improve outcomes and carryover.  Baseline:  Goal status: MET 10/20/23 btwn HEP and ex class  2.  Patient will report 50-75% improvement in low back pain to improve QOL.  Baseline: 5/10 Goal status: 10/03/23 progressing  10/20/23 40% progressing and 10/24/23  3.  Patient will demonstrate full pain free lumbar ROM to  perform ADLs.   Baseline: see chart Goal status: 10/03/23 progressing  and 10/20/23  and 10/24/23  4.  Patient will be up and down a set of stairs without pain.  Baseline: pain going up and down Goal status: ongoing 10/03/23  progressing 10/20/23  and 10/24/23  5.  Patient will be able to sleep in her bed again for at least 3-4 hrs.  Baseline: 1 hr tolerance  Goal status: 10/03/23 progressing  and 10/20/23  6.  Patient will tolerate 30 min of standing to perform ADLs and household chores Baseline: ~10 mins Goal status: 10/03/23 progressing 10/20/23 progressing 15-20  . MET 10/24/23 pt stated stated did 30-45 min   PLAN:  PT FREQUENCY: 2x/week  PT DURATION: 10 weeks  PLANNED INTERVENTIONS: 97110-Therapeutic exercises, 97530- Therapeutic activity, 97112- Neuromuscular re-education, 97535- Self Care, [id number]- Manual therapy, [phone]- Gait training, [phone]- Electrical stimulation (unattended), [id number]- Vasopneumatic device, [id number]- Traction (mechanical), [phone] (1-2 muscles), [phone] (3+ muscles)- Dry Needling, Patient/Family education, Balance training, Stair training, Taping, Joint mobilization, Spinal mobilization, Cryotherapy, and Moist heat.  PLAN FOR NEXT SESSION:  progress func strength   Jon Dail Lerew PTA 10/24/23 10:11 AM

## 2023-10-26 ENCOUNTER — Ambulatory Visit: Admitting: Physical Therapy

## 2023-10-26 DIAGNOSIS — M25551 Pain in right hip: Secondary | ICD-10-CM

## 2023-10-26 DIAGNOSIS — M5417 Radiculopathy, lumbosacral region: Secondary | ICD-10-CM | POA: Diagnosis not present

## 2023-10-26 DIAGNOSIS — M25552 Pain in left hip: Secondary | ICD-10-CM | POA: Diagnosis not present

## 2023-10-26 DIAGNOSIS — M6281 Muscle weakness (generalized): Secondary | ICD-10-CM

## 2023-10-26 NOTE — Therapy (Signed)
 OUTPATIENT PHYSICAL THERAPY THORACOLUMBAR TREATMENT     Patient Name: Margaret Reese MRN: [id number] DOB:1945/12/23, 78 y.o., female Today's Date: 10/26/2023  END OF SESSION:  PT End of Session - 10/26/23 0921     Visit Number 11    Date for PT Re-Evaluation 11/14/23    Authorization Type Medicare    PT Start Time 0930    PT Stop Time 1010    PT Time Calculation (min) 40 min            Past Medical History:  Diagnosis Date   Allergy    Anxiety    Arthritis    Asthma    Complication of anesthesia    pt states has difficulty awakening; also has increased sinus drainage   Constipation    Edema, lower extremity    Encounter for interrogation of cardiac pacemaker 09/05/2018   Falls    GERD (gastroesophageal reflux disease)    H/O back injury    History of bronchitis    History of colon polyps    Hypertension    Imbalance    Kidney cysts    pt states not sure which kidney does see kidney specialist yearly pt states every thing okay currently    Legally blind in right eye, as defined in [place]     Mobitz type II atrioventricular block 11/08/2016   Multiple gastric ulcers    Obesity    OSA (obstructive sleep apnea) 05/03/2022   Pacemaker S/P [employer] Jude [employer]urity MRI model [id number] 05/20/2016   Renal stone    Retinal vein occlusion    Shortness of breath    Sinus node dysfunction (HCC) 12/16/2018   Tingling    left arm    Trace cataracts    Urinary frequency    Vitamin D  deficiency    Past Surgical History:  Procedure Laterality Date   CARDIOVASCULAR STRESS TEST  dec 2016   CHOLECYSTECTOMY     COLONOSCOPY  2011   EYE SURGERY Bilateral    Cataract surgery.    LEFT HEART CATH AND CORONARY ANGIOGRAPHY N/A 05/19/2016   Procedure: Left Heart Cath and Coronary Angiography;  Surgeon: Salena Negri, MD;  Location: [facility];  Service: Cardiovascular;  Laterality: N/A;   PACEMAKER IMPLANT N/A 05/20/2016   Procedure: Pacemaker Implant;  Surgeon: Will Gladis Norton,  MD;  Location: [facility];  Service: Cardiovascular;  Laterality: N/A;   TONSILLECTOMY     TOTAL HIP ARTHROPLASTY Right 01/30/2015   Procedure: RIGHT TOTAL HIP ARTHROPLASTY ANTERIOR APPROACH AND REMOVAL LIPOMA RIGHT HIP;  Surgeon: Lonni CINDERELLA Poli, MD;  Location: [facility];  Service: Orthopedics;  Laterality: Right;   UPPER GI ENDOSCOPY     Patient Active Problem List   Diagnosis Date Noted   Back pain 12/26/2022   Fluid retention in legs 11/13/2022   Stress 06/14/2022   Obesity (HCC)- Start BMI 40.06 05/12/2022   OSA (obstructive sleep apnea) 05/03/2022   Hyperglycemia 03/15/2022   Heart block AV complete (HCC) 01/05/2022   Pulmonary hypertension (HCC) 11/19/2021   Chronic diastolic (congestive) heart failure (HCC) 06/01/2020   Pacemaker S/P St Jude Medical Assurity MRI model [id number] 05/20/2016   History of total right hip arthroplasty 01/30/2015   Arthritis 09/11/2014   Renal cyst 09/04/2014   Allergic rhinitis due to pollen 09/04/2014   Essential hypertension 07/08/2010   GERD (gastroesophageal reflux disease) 07/08/2010   Calcium oxalate renal stones 07/08/2010   Hyperlipidemia 07/08/2010    PCP: Norleen Jobs  REFERRING PROVIDER: Lonni Poli  REFERRING DIAG:  [phone] (ICD-10-CM) - History of total right hip arthroplasty  M54.41,G89.29 (ICD-10-CM) - Chronic right-sided low back pain with right-sided sciatica    Rationale for Evaluation and Treatment: Rehabilitation  THERAPY DIAG:  Radiculopathy, lumbosacral region  Bilateral hip pain  Muscle weakness (generalized)  ONSET DATE: 08/16/23 referral  SUBJECTIVE:                                                                                                                                                                                           SUBJECTIVE STATEMENT:   Seeing improvements. Made it all the way  thru 45 min ex class yesterday.  PERTINENT HISTORY:  Patient is a 78 year old female who  is almost 9 years out from a right total hip arthroplasty.  She comes in today ambulating with a cane.  She has been having a hard time getting up from a seated position and up from the floor when she is down on the floor.  She reports sciatic pain on the right side and also some left lower extremity and right lower extremity radicular types of symptoms.  She is not a diabetic.  She takes anti-inflammatories over-the-counter on a rare basis.  She denies any change in bowel or bladder function.   On exam both hips move smoothly and fluidly with no blocks or rotation.  Most of her pain seems to be with positive straight leg raise on both sides more so on the right than the left but she does have left lower extremity radicular symptoms as well.  PAIN:  Are you having pain? Yes: NPRS scale: 4/10 Pain location: both hips, into legs, some pain in low back Pain description: becoming more frequent and intense, electrical shock, achy  Aggravating factors: bending, standing too long Relieving factors: nothing really  PRECAUTIONS: None  RED FLAGS: None   WEIGHT BEARING RESTRICTIONS: No  FALLS:  Has patient fallen in last 6 months? No  LIVING ENVIRONMENT: Lives with: lives with their spouse Lives in: House/apartment Stairs: Yes: External: 5 steps; bilateral but cannot reach both Has following equipment at home: Single point cane and Walker - 2 wheeled  PLOF: Independent and Independent with basic ADLs  PATIENT GOALS: to not hurt and I don't want to risk falling  NEXT MD VISIT: 09/20/23  OBJECTIVE:  Note: Objective measures were completed at Evaluation unless otherwise noted.  DIAGNOSTIC FINDINGS:  2 views of the lumbar spine show a significant grade 1 spondylolisthesis at L4-L5 with some degenerative changes as well. An AP pelvis and lateral of the right hip shows a well-seated right total hip arthroplasty.  There is moderate arthritis of the left hip.    COGNITION: Overall cognitive  status: Within functional limits for tasks assessed     SENSATION: WFL   POSTURE: rounded shoulders and forward head  PALPATION: Some tenderness in L2-L5  LUMBAR ROM:   AROM eval 09/28/23 10/03/23 10/20/23  Flexion Can touch toes, painful coming back up [facility] WFLS  Extension 25% with pain 25# with pain Limited 75% Limited 25%  Right lateral flexion Mid thigh with pain Limited 25% Limited 25% Limited 25%  Left lateral flexion Fibular head no pain Limited 25% Limited 25% WFLs  Right rotation 25% [facility] WFL   Left rotation 50% WFL WFL    (Blank rows = not tested)  LOWER EXTREMITY ROM:   grossly within functional ranges   LOWER EXTREMITY MMT:  grossly 5/5, some pain with resisted knee extension on RLE, hip extension 4/5  LUMBAR SPECIAL TESTS:  Straight leg raise test: Positive and FABER test: Positive  FUNCTIONAL TESTS:  5 times sit to stand: 10.39s Timed up and go (TUG): 15.53s w/o cane  GAIT: Distance walked: in clinic distances Assistive device utilized: Single point cane Level of assistance: Complete Independence  TREATMENT DATE:  10/26/23 Nustep L 5 Ball toss on airex- CGA Walked 200 feet with ball toss - no LOB 5# lateral flexion 15 x each Resisted trunk rotation 15 x each side- green tband STS with 5# chest press 10 x  Resisted trunk flex and ext seated 2 sets 10- blue tband Resisted gait side stepping 30# 4 x each way ADD ball squeeze 15 x Green tband clams and hip flex 2 sets 10 3# 6 in step taps 20 x 2 sets CGA 3# lateral step tap 12 each leg PROM, stretch and STW ( left IT and RT hip flexor) LE and trunk  10/24/23 5# farmers carry 1 lap each hand. Pain with wt in left hand 5# lateral flexion 15 x each side STS with 5# chest press 10 Nustep L 3 Step up 6 inch 5 x each leg, light hand support then 5 x more from airex Standing on airex ball toss- no LOB Lateral stepping over foam roll on iarex 10 x min a with LOB-setpping left harder Lateral  steps 5 x each way with rail 4 in and 6 in 3# stepping over and into 6 inch box 5 x fwd and latrally 3# 6 in step taps 20 x 2 sets CG-minA     10/20/23 Assessed ROM and goals Nustep L 3 Step over step 2 rail 4 inch 4 x Step over step 2 rail 6 inch 3 x Step up 5 x each leg 6 inch 1 rail Step down 5 x each leg 6 inch 2 rail- RT leg weaker Leg press 20# 3 sets 10 20# resisted gait 5 x fwd and back, 3 x each side STS with yellow wt ball 10 x HS curl 20# 2 sets 10 PROM LE and trunk with STW to RT quad/hip flexor and Left ITB  10/13/22\5 Green tband seated - HS curl , hip flex and clams 2 sets 10 Seated 3 # LAQ 2 sets 10 BIl Seated 3# ER/IR 10 x BIL HHA 3# marching fwd and back 15 feet 2 x HHA 3# side stepping 15 feet 2x 3# ankle wt alt step tap 6 inch 20 x with SPC 2 sets 3# ankle step tap laterally 6 inch 10 x each STS on airex CGA 10 x Foam beam in //  bars tandem fwd and back and side stepping Gentle STW RT thigh/hip flex and left ITB Gentle PROM LE and trunk- cuing to relax  10/05/23 PROM LE and trunk  STW RT hip flex and quad on a stretch STW to Left ITB on stretch Pelvic ROM and stab ex on dyna disc 4 in step up fwd with opp hip ext 10 x with UE 4 in step up with opp hip abd x 10 with UE Seated trunk flex and ext 2 sets 10 blue tband Educ in hip flex stretch off bed and adding rolling to RT thigh  10/03/23 PROM LE and trunk STW to distal ITB Feet on ball bridge,KTC and obl Green tband clams BIL 10 x then SL 10 x Green tband marching 10 x BIL  2 sets ADD ball squeeze 10 x then 10x with bridge SLR 10x BIL then SLR with abd 10 x 3# LAQ 20 x alt 3# hip flex 20 x alt 3# standing hip flex,ext and abd 10 x each  6in 3# step tap with SPC fwd and laterally 10 x each  09/28/23  Feet on ball bridge,KTC and obl- left knee catch with KTC, cuing with rotation to not twist so much Isometric abdominals 3 sec hold PROM LE and trunk  Left ITB stretching and STW- very tender.  Instructed in rolling at home and stretching SL hip 3 way 10 x BIL In // bars on foam roll 3 x  laterally ADD ball squeeze 15 x Clams 2 sets 10 STS with wt ball OH 10 x    09/25/23 Walking 2 laps with SPC   Supine LTR, HS stretch, SKTC Feet on pball rotations and knees to chest  Ball squeeze 2x10 Bridges 2x10 STS 2x10 Calf stretch  Standing rows and ext red band 2x10  09/21/23  Declined Nustep- last time my pacemaker was really revving up and I was very concerned and surprised, I don't need that stress today   Spent some time discussing and educating on prior imaging findings (spondylolisthesis, hip OA, etc) and how it is possibly affecting (or not affecting) current pain levels   Supine hip flexor stretch 2x30 seconds B Supine frog stretch 2x30 seconds Piriformis stretch 2x30 seconds B  HS/gastroc stretch 2x30 seconds B  Seated TA set 15x3 seconds Seated TA set + mini-march x12 Seated TA set + opposite UE/LE flexion x10B STS x10 no UEs cues for eccentric lower     09/14/23 Nustep L 5 LE only at 4 1/2 min decreased resistance to 3 Black tband trunk ext 2 sets 10 ADD ball squeeze 2 sets 10 Clams red tband 2 sets 10  Red tband hip flex 2 sets 10 Iso abdominals 2 set s 10 Wt ball chest press then obl 10 x each seated 3# alt LAQ 20 x 3 # hip abd with flexion 10 x BIL 2 sets  Standing 3# hip ext and abd 10 x Legs on ball bridge 2 sets 10 Legs on ball obl 20 x     09/05/23 EVAL  PATIENT EDUCATION:  Education details: POC, HEP, sciatica, hip flexor tightness sleeping in recliners Person educated: Patient Education method: Medical illustrator Education comprehension: verbalized understanding and returned demonstration  HOME EXERCISE PROGRAM: Access Code: [email or web address] URL: [email or web address] Date:  10/03/2023 Prepared by: Sindhu Nguyen  Exercises - Supine Bridge  - 1 x daily - 7 x weekly - 1 sets - 10 reps - 3 hold - Supine Hip Adduction Isometric with Ball  - 1 x daily - 7 x weekly - 2 sets - 10 reps - Supine March  - 1 x daily - 7 x weekly - 2 sets - 10 reps - Hooklying Clamshell with Resistance  - 1 x daily - 7 x weekly - 2 sets - 10 reps - Supine Straight Leg Raises  - 1 x daily - 7 x weekly - 3 sets - 10 reps - Seated Long Arc Quad  - 1 x daily - 7 x weekly - 2 sets - 10 reps - Seated March  - 1 x daily - 7 x weekly - 2 sets - 10 reps - Seated Hip Flexion and External Rotation  - 1 x daily - 7 x weekly - 2 sets - 10 reps - Standing March with Counter Support  - 1 x daily - 7 x weekly - 1 sets - 10 reps - Standing Hip Abduction with Counter Support  - 1 x daily - 7 x weekly - 1 sets - 10 reps - Standing Hip Extension with Counter Support  - 1 x daily - 7 x weekly - 1 sets - 10 reps Access Code: [email or web address] URL: [email or web address] Date: 09/05/2023 Prepared by: Almetta Fam  Exercises - Supine Lower Trunk Rotation  - 1 x daily - 7 x weekly - 2 sets - 10 reps - Supine Bridge  - 1 x daily - 7 x weekly - 2 sets - 10 reps - Supine Figure 4 Piriformis Stretch  - 1 x daily - 7 x weekly - 2 reps - 15 hold - Modified Thomas Stretch  - 1 x daily - 7 x weekly - 2 reps - 15 hold  ASSESSMENT:  CLINICAL IMPRESSION:  pt amb in carrying SPC. Verb pleased she was able to make it through entire 45 min ex class yesterday and no soreness from last therapy session or class. Pt is getting stronger as noted but ease of mvmt and ex. Still tightness and TP but less  OBJECTIVE IMPAIRMENTS: Abnormal gait, decreased activity tolerance, decreased balance, decreased ROM, decreased strength, and pain.   ACTIVITY LIMITATIONS: bending, squatting, stairs, and locomotion level  PARTICIPATION LIMITATIONS: cleaning, laundry, driving, shopping, community activity, and yard work  PERSONAL  FACTORS: Age, Past/current experiences, Time since onset of injury/illness/exacerbation, and 1-2 comorbidities: arthritis, hx of surgery are also affecting patient's functional outcome.   REHAB POTENTIAL: Good  CLINICAL DECISION MAKING: Stable/uncomplicated  EVALUATION COMPLEXITY: Low  GOALS: Goals reviewed with patient? Yes  SHORT TERM GOALS: Target date: 10/10/23  Patient will be independent with initial HEP.  Baseline:  Goal status: 09/14/23 progressing  09/28/23 MET  2.  Patient will report centralization of radicular symptoms.  Baseline: pain in both legs Goal status: 09/28/23 no changes   10/03/23 progressing   MET 10/20/23   LONG TERM GOALS: Target date: 11/14/23  Patient will be independent with advanced/ongoing HEP to improve outcomes and carryover.  Baseline:  Goal status: MET 10/20/23 btwn HEP and ex class  2.  Patient will report 50-75% improvement in low back pain  to improve QOL.  Baseline: 5/10 Goal status: 10/03/23 progressing  10/20/23 40% progressing and 10/24/23  3.  Patient will demonstrate full pain free lumbar ROM to perform ADLs.   Baseline: see chart Goal status: 10/03/23 progressing  and 10/20/23  and 10/24/23  4.  Patient will be up and down a set of stairs without pain.  Baseline: pain going up and down Goal status: ongoing 10/03/23  progressing 10/20/23  and 10/24/23  5.  Patient will be able to sleep in her bed again for at least 3-4 hrs.  Baseline: 1 hr tolerance  Goal status: 10/03/23 progressing  and 10/20/23  6.  Patient will tolerate 30 min of standing to perform ADLs and household chores Baseline: ~10 mins Goal status: 10/03/23 progressing 10/20/23 progressing 15-20  . MET 10/24/23 pt stated stated did 30-45 min   PLAN:  PT FREQUENCY: 2x/week  PT DURATION: 10 weeks  PLANNED INTERVENTIONS: 97110-Therapeutic exercises, 97530- Therapeutic activity, 97112- Neuromuscular re-education, 97535- Self Care, [id number]- Manual therapy, [phone]- Gait training, [phone]-  Electrical stimulation (unattended), 97016- Vasopneumatic device, [id number]- Traction (mechanical), [phone] (1-2 muscles), [id number] (3+ muscles)- Dry Needling, Patient/Family education, Balance training, Stair training, Taping, Joint mobilization, Spinal mobilization, Cryotherapy, and Moist heat.  PLAN FOR NEXT SESSION: progress func strength   Syon Tews PTA 10/26/23 10:08 AM

## 2023-10-31 ENCOUNTER — Ambulatory Visit: Admitting: Physical Therapy

## 2023-10-31 DIAGNOSIS — M5417 Radiculopathy, lumbosacral region: Secondary | ICD-10-CM

## 2023-10-31 DIAGNOSIS — M6281 Muscle weakness (generalized): Secondary | ICD-10-CM | POA: Diagnosis not present

## 2023-10-31 DIAGNOSIS — M25551 Pain in right hip: Secondary | ICD-10-CM

## 2023-10-31 DIAGNOSIS — M25552 Pain in left hip: Secondary | ICD-10-CM | POA: Diagnosis not present

## 2023-10-31 NOTE — Therapy (Signed)
 OUTPATIENT PHYSICAL THERAPY THORACOLUMBAR TREATMENT     Patient Name: Azaylea G Duclos MRN: [id number] DOB:1945/11/24, 78 y.o., female Today's Date: 10/31/2023  END OF SESSION:  PT End of Session - 10/31/23 1054     Visit Number 12    Date for PT Re-Evaluation 11/14/23    Authorization Type Medicare    PT Start Time 1055    PT Stop Time 1140    PT Time Calculation (min) 45 min            Past Medical History:  Diagnosis Date   Allergy    Anxiety    Arthritis    Asthma    Complication of anesthesia    pt states has difficulty awakening; also has increased sinus drainage   Constipation    Edema, lower extremity    Encounter for interrogation of cardiac pacemaker 09/05/2018   Falls    GERD (gastroesophageal reflux disease)    H/O back injury    History of bronchitis    History of colon polyps    Hypertension    Imbalance    Kidney cysts    pt states not sure which kidney does see kidney specialist yearly pt states every thing okay currently    Legally blind in right eye, as defined in [place]     Mobitz type II atrioventricular block 11/08/2016   Multiple gastric ulcers    Obesity    OSA (obstructive sleep apnea) 05/03/2022   Pacemaker S/P [employer] Jude [employer]urity MRI model [id number] 05/20/2016   Renal stone    Retinal vein occlusion    Shortness of breath    Sinus node dysfunction (HCC) 12/16/2018   Tingling    left arm    Trace cataracts    Urinary frequency    Vitamin D  deficiency    Past Surgical History:  Procedure Laterality Date   CARDIOVASCULAR STRESS TEST  dec 2016   CHOLECYSTECTOMY     COLONOSCOPY  2011   EYE SURGERY Bilateral    Cataract surgery.    LEFT HEART CATH AND CORONARY ANGIOGRAPHY N/A 05/19/2016   Procedure: Left Heart Cath and Coronary Angiography;  Surgeon: Salena Negri, MD;  Location: [facility];  Service: Cardiovascular;  Laterality: N/A;   PACEMAKER IMPLANT N/A 05/20/2016   Procedure: Pacemaker Implant;  Surgeon: Will Gladis Norton,  MD;  Location: [facility];  Service: Cardiovascular;  Laterality: N/A;   TONSILLECTOMY     TOTAL HIP ARTHROPLASTY Right 01/30/2015   Procedure: RIGHT TOTAL HIP ARTHROPLASTY ANTERIOR APPROACH AND REMOVAL LIPOMA RIGHT HIP;  Surgeon: Lonni CINDERELLA Poli, MD;  Location: [facility];  Service: Orthopedics;  Laterality: Right;   UPPER GI ENDOSCOPY     Patient Active Problem List   Diagnosis Date Noted   Back pain 12/26/2022   Fluid retention in legs 11/13/2022   Stress 06/14/2022   Obesity (HCC)- Start BMI 40.06 05/12/2022   OSA (obstructive sleep apnea) 05/03/2022   Hyperglycemia 03/15/2022   Heart block AV complete (HCC) 01/05/2022   Pulmonary hypertension (HCC) 11/19/2021   Chronic diastolic (congestive) heart failure (HCC) 06/01/2020   Pacemaker S/P St Jude Medical Assurity MRI model [id number] 05/20/2016   History of total right hip arthroplasty 01/30/2015   Arthritis 09/11/2014   Renal cyst 09/04/2014   Allergic rhinitis due to pollen 09/04/2014   Essential hypertension 07/08/2010   GERD (gastroesophageal reflux disease) 07/08/2010   Calcium oxalate renal stones 07/08/2010   Hyperlipidemia 07/08/2010    PCP: Norleen Jobs  REFERRING PROVIDER: Lonni Poli  REFERRING DIAG:  [phone] (ICD-10-CM) - History of total right hip arthroplasty  M54.41,G89.29 (ICD-10-CM) - Chronic right-sided low back pain with right-sided sciatica    Rationale for Evaluation and Treatment: Rehabilitation  THERAPY DIAG:  Radiculopathy, lumbosacral region  Bilateral hip pain  Muscle weakness (generalized)  ONSET DATE: 08/16/23 referral  SUBJECTIVE:                                                                                                                                                                                           SUBJECTIVE STATEMENT:   doing pretty good  PERTINENT HISTORY:  Patient is a 78 year old female who is almost 9 years out from a right total hip arthroplasty.   She comes in today ambulating with a cane.  She has been having a hard time getting up from a seated position and up from the floor when she is down on the floor.  She reports sciatic pain on the right side and also some left lower extremity and right lower extremity radicular types of symptoms.  She is not a diabetic.  She takes anti-inflammatories over-the-counter on a rare basis.  She denies any change in bowel or bladder function.   On exam both hips move smoothly and fluidly with no blocks or rotation.  Most of her pain seems to be with positive straight leg raise on both sides more so on the right than the left but she does have left lower extremity radicular symptoms as well.  PAIN:  Are you having pain? Yes: NPRS scale: 4/10 Pain location: both hips, into legs, some pain in low back Pain description: becoming more frequent and intense, electrical shock, achy  Aggravating factors: bending, standing too long Relieving factors: nothing really  PRECAUTIONS: None  RED FLAGS: None   WEIGHT BEARING RESTRICTIONS: No  FALLS:  Has patient fallen in last 6 months? No  LIVING ENVIRONMENT: Lives with: lives with their spouse Lives in: House/apartment Stairs: Yes: External: 5 steps; bilateral but cannot reach both Has following equipment at home: Single point cane and Walker - 2 wheeled  PLOF: Independent and Independent with basic ADLs  PATIENT GOALS: to not hurt and I don't want to risk falling  NEXT MD VISIT: 09/20/23  OBJECTIVE:  Note: Objective measures were completed at Evaluation unless otherwise noted.  DIAGNOSTIC FINDINGS:  2 views of the lumbar spine show a significant grade 1 spondylolisthesis at L4-L5 with some degenerative changes as well. An AP pelvis and lateral of the right hip shows a well-seated right total hip arthroplasty. There is moderate arthritis of the left hip.  COGNITION: Overall cognitive status: Within functional limits for tasks  assessed     SENSATION: WFL   POSTURE: rounded shoulders and forward head  PALPATION: Some tenderness in L2-L5  LUMBAR ROM:   AROM eval 09/28/23 10/03/23 10/20/23  Flexion Can touch toes, painful coming back up [facility] WFLS  Extension 25% with pain 25# with pain Limited 75% Limited 25%  Right lateral flexion Mid thigh with pain Limited 25% Limited 25% Limited 25%  Left lateral flexion Fibular head no pain Limited 25% Limited 25% WFLs  Right rotation 25% [facility] WFL   Left rotation 50% WFL WFL    (Blank rows = not tested)  LOWER EXTREMITY ROM:   grossly within functional ranges   LOWER EXTREMITY MMT:  grossly 5/5, some pain with resisted knee extension on RLE, hip extension 4/5  LUMBAR SPECIAL TESTS:  Straight leg raise test: Positive and FABER test: Positive  FUNCTIONAL TESTS:  5 times sit to stand: 10.39s Timed up and go (TUG): 15.53s w/o cane  GAIT: Distance walked: in clinic distances Assistive device utilized: Single point cane Level of assistance: Complete Independence  TREATMENT DATE:  10/31/23 Amb 250 feet outside without AD without painn but SOB and multi standing rest breaks needed STS with wt ball chest press 10 x Standing wt ball trunk rotation 10 x each side Leg press 20# 3 sets 10 Resisted trunk flex and ext 20 x Seated row 20 # 2 sets 10 Resisted gait side stepping 30# 4 x each way ADD green tband side stepping 10 feet 5 x each Step up fwd 6 in opp leg ext 10 x then side step up with opp leg ABD HS curl 25# 2 sets 10 Knee ext 10 # 2 sets 10 5# lateral flexion standing 15 x each    10/26/23 Nustep L 5 Ball toss on airex- CGA Walked 200 feet with ball toss - no LOB 5# lateral flexion 15 x each Resisted trunk rotation 15 x each side- green tband STS with 5# chest press 10 x  Resisted trunk flex and ext seated 2 sets 10- blue tband Resisted gait side stepping 30# 4 x each way ADD ball squeeze 15 x Green tband clams and hip flex 2 sets 10 3# 6  in step taps 20 x 2 sets CGA 3# lateral step tap 12 each leg PROM, stretch and STW ( left IT and RT hip flexor) LE and trunk  10/24/23 5# farmers carry 1 lap each hand. Pain with wt in left hand 5# lateral flexion 15 x each side STS with 5# chest press 10 Nustep L 3 Step up 6 inch 5 x each leg, light hand support then 5 x more from airex Standing on airex ball toss- no LOB Lateral stepping over foam roll on iarex 10 x min a with LOB-setpping left harder Lateral steps 5 x each way with rail 4 in and 6 in 3# stepping over and into 6 inch box 5 x fwd and latrally 3# 6 in step taps 20 x 2 sets CG-minA     10/20/23 Assessed ROM and goals Nustep L 3 Step over step 2 rail 4 inch 4 x Step over step 2 rail 6 inch 3 x Step up 5 x each leg 6 inch 1 rail Step down 5 x each leg 6 inch 2 rail- RT leg weaker Leg press 20# 3 sets 10 20# resisted gait 5 x fwd and back, 3 x each side STS with yellow  wt ball 10 x HS curl 20# 2 sets 10 PROM LE and trunk with STW to RT quad/hip flexor and Left ITB  10/13/22\5 Green tband seated - HS curl , hip flex and clams 2 sets 10 Seated 3 # LAQ 2 sets 10 BIl Seated 3# ER/IR 10 x BIL HHA 3# marching fwd and back 15 feet 2 x HHA 3# side stepping 15 feet 2x 3# ankle wt alt step tap 6 inch 20 x with SPC 2 sets 3# ankle step tap laterally 6 inch 10 x each STS on airex CGA 10 x Foam beam in // bars tandem fwd and back and side stepping Gentle STW RT thigh/hip flex and left ITB Gentle PROM LE and trunk- cuing to relax  10/05/23 PROM LE and trunk  STW RT hip flex and quad on a stretch STW to Left ITB on stretch Pelvic ROM and stab ex on dyna disc 4 in step up fwd with opp hip ext 10 x with UE 4 in step up with opp hip abd x 10 with UE Seated trunk flex and ext 2 sets 10 blue tband Educ in hip flex stretch off bed and adding rolling to RT thigh  10/03/23 PROM LE and trunk STW to distal ITB Feet on ball bridge,KTC and obl Green tband clams BIL 10  x then SL 10 x Green tband marching 10 x BIL  2 sets ADD ball squeeze 10 x then 10x with bridge SLR 10x BIL then SLR with abd 10 x 3# LAQ 20 x alt 3# hip flex 20 x alt 3# standing hip flex,ext and abd 10 x each  6in 3# step tap with SPC fwd and laterally 10 x each  09/28/23  Feet on ball bridge,KTC and obl- left knee catch with KTC, cuing with rotation to not twist so much Isometric abdominals 3 sec hold PROM LE and trunk  Left ITB stretching and STW- very tender. Instructed in rolling at home and stretching SL hip 3 way 10 x BIL In // bars on foam roll 3 x  laterally ADD ball squeeze 15 x Clams 2 sets 10 STS with wt ball OH 10 x    09/25/23 Walking 2 laps with SPC   Supine LTR, HS stretch, SKTC Feet on pball rotations and knees to chest  Ball squeeze 2x10 Bridges 2x10 STS 2x10 Calf stretch  Standing rows and ext red band 2x10  09/21/23  Declined Nustep- last time my pacemaker was really revving up and I was very concerned and surprised, I don't need that stress today   Spent some time discussing and educating on prior imaging findings (spondylolisthesis, hip OA, etc) and how it is possibly affecting (or not affecting) current pain levels   Supine hip flexor stretch 2x30 seconds B Supine frog stretch 2x30 seconds Piriformis stretch 2x30 seconds B  HS/gastroc stretch 2x30 seconds B  Seated TA set 15x3 seconds Seated TA set + mini-march x12 Seated TA set + opposite UE/LE flexion x10B STS x10 no UEs cues for eccentric lower     09/14/23 Nustep L 5 LE only at 4 1/2 min decreased resistance to 3 Black tband trunk ext 2 sets 10 ADD ball squeeze 2 sets 10 Clams red tband 2 sets 10  Red tband hip flex 2 sets 10 Iso abdominals 2 set s 10 Wt ball chest press then obl 10 x each seated 3# alt LAQ 20 x 3 # hip abd with flexion 10 x BIL 2 sets  Standing 3# hip ext and abd 10 x Legs on ball bridge 2 sets 10 Legs on ball obl 20 x     09/05/23 EVAL                                                                                                                                 PATIENT EDUCATION:  Education details: POC, HEP, sciatica, hip flexor tightness sleeping in recliners Person educated: Patient Education method: Medical illustrator Education comprehension: verbalized understanding and returned demonstration  HOME EXERCISE PROGRAM: Access Code: [email or web address] URL: [email or web address] Date: 10/03/2023 Prepared by: Irby Fails  Exercises - Supine Bridge  - 1 x daily - 7 x weekly - 1 sets - 10 reps - 3 hold - Supine Hip Adduction Isometric with Ball  - 1 x daily - 7 x weekly - 2 sets - 10 reps - Supine March  - 1 x daily - 7 x weekly - 2 sets - 10 reps - Hooklying Clamshell with Resistance  - 1 x daily - 7 x weekly - 2 sets - 10 reps - Supine Straight Leg Raises  - 1 x daily - 7 x weekly - 3 sets - 10 reps - Seated Long Arc Quad  - 1 x daily - 7 x weekly - 2 sets - 10 reps - Seated March  - 1 x daily - 7 x weekly - 2 sets - 10 reps - Seated Hip Flexion and External Rotation  - 1 x daily - 7 x weekly - 2 sets - 10 reps - Standing March with Counter Support  - 1 x daily - 7 x weekly - 1 sets - 10 reps - Standing Hip Abduction with Counter Support  - 1 x daily - 7 x weekly - 1 sets - 10 reps - Standing Hip Extension with Counter Support  - 1 x daily - 7 x weekly - 1 sets - 10 reps Access Code: [email or web address] URL: [email or web address] Date: 09/05/2023 Prepared by: Almetta Fam  Exercises - Supine Lower Trunk Rotation  - 1 x daily - 7 x weekly - 2 sets - 10 reps - Supine Bridge  - 1 x daily - 7 x weekly - 2 sets - 10 reps - Supine Figure 4 Piriformis Stretch  - 1 x daily - 7 x weekly - 2 reps - 15 hold - Modified Thomas Stretch  - 1 x daily - 7 x weekly - 2 reps - 15 hold  ASSESSMENT:  CLINICAL IMPRESSION:  pt amb outside without AD but very SOB, she states its the air as she does not get SOB in side. Progressed  strength with increased wt added to step up ex. Pt still struggles with getting comfortable in bed. Catch in left knee comes and goes .   OBJECTIVE IMPAIRMENTS: Abnormal gait, decreased activity tolerance, decreased balance, decreased ROM, decreased strength, and pain.   ACTIVITY LIMITATIONS: bending, squatting, stairs, and locomotion level  PARTICIPATION LIMITATIONS: cleaning, laundry, driving, shopping, community activity, and yard work  PERSONAL FACTORS: Age, Past/current experiences, Time since onset of injury/illness/exacerbation, and 1-2 comorbidities: arthritis, hx of surgery are also affecting patient's functional outcome.   REHAB POTENTIAL: Good  CLINICAL DECISION MAKING: Stable/uncomplicated  EVALUATION COMPLEXITY: Low  GOALS: Goals reviewed with patient? Yes  SHORT TERM GOALS: Target date: 10/10/23  Patient will be independent with initial HEP.  Baseline:  Goal status: 09/14/23 progressing  09/28/23 MET  2.  Patient will report centralization of radicular symptoms.  Baseline: pain in both legs Goal status: 09/28/23 no changes   10/03/23 progressing   MET 10/20/23   LONG TERM GOALS: Target date: 11/14/23  Patient will be independent with advanced/ongoing HEP to improve outcomes and carryover.  Baseline:  Goal status: MET 10/20/23 btwn HEP and ex class  2.  Patient will report 50-75% improvement in low back pain to improve QOL.  Baseline: 5/10 Goal status: 10/03/23 progressing  10/20/23 40% progressing and 10/24/23 10/31/23 progressing  3.  Patient will demonstrate full pain free lumbar ROM to perform ADLs.   Baseline: see chart Goal status: 10/03/23 progressing  and 10/20/23  and 10/24/23. 9/9 WFL except ext limited 50%  4.  Patient will be up and down a set of stairs without pain.  Baseline: pain going up and down Goal status: ongoing 10/03/23  progressing 10/20/23  and 10/24/23  5.  Patient will be able to sleep in her bed again for at least 3-4 hrs.  Baseline: 1 hr tolerance   Goal status: 10/03/23 progressing  and 10/20/23. 10/31/23 progressing slowly- hard to get comfortable  6.  Patient will tolerate 30 min of standing to perform ADLs and household chores Baseline: ~10 mins Goal status: 10/03/23 progressing 10/20/23 progressing 15-20  . MET 10/24/23 pt stated stated did 30-45 min   PLAN:  PT FREQUENCY: 2x/week  PT DURATION: 10 weeks  PLANNED INTERVENTIONS: 97110-Therapeutic exercises, 97530- Therapeutic activity, 97112- Neuromuscular re-education, 97535- Self Care, [id number]- Manual therapy, [phone]- Gait training, [phone]- Electrical stimulation (unattended), [id number]- Vasopneumatic device, [id number]- Traction (mechanical), [phone] (1-2 muscles), 20561 (3+ muscles)- Dry Needling, Patient/Family education, Balance training, Stair training, Taping, Joint mobilization, Spinal mobilization, Cryotherapy, and Moist heat.  PLAN FOR NEXT SESSION: progress func strength   Reda Citron PTA 10/31/23 10:55 AM

## 2023-11-03 ENCOUNTER — Ambulatory Visit: Admitting: Physical Therapy

## 2023-11-03 DIAGNOSIS — M25551 Pain in right hip: Secondary | ICD-10-CM

## 2023-11-03 DIAGNOSIS — M25552 Pain in left hip: Secondary | ICD-10-CM | POA: Diagnosis not present

## 2023-11-03 DIAGNOSIS — M5417 Radiculopathy, lumbosacral region: Secondary | ICD-10-CM | POA: Diagnosis not present

## 2023-11-03 DIAGNOSIS — M6281 Muscle weakness (generalized): Secondary | ICD-10-CM | POA: Diagnosis not present

## 2023-11-03 NOTE — Therapy (Signed)
 OUTPATIENT PHYSICAL THERAPY THORACOLUMBAR TREATMENT     Patient Name: Faithe G Enis MRN: [id number] DOB:19-Jan-1946, 78 y.o., female Today's Date: 11/03/2023  END OF SESSION:  PT End of Session - 11/03/23 0926     Visit Number 13    Date for PT Re-Evaluation 11/14/23    Authorization Type Medicare    PT Start Time 0930    PT Stop Time 1010    PT Time Calculation (min) 40 min            Past Medical History:  Diagnosis Date   Allergy    Anxiety    Arthritis    Asthma    Complication of anesthesia    pt states has difficulty awakening; also has increased sinus drainage   Constipation    Edema, lower extremity    Encounter for interrogation of cardiac pacemaker 09/05/2018   Falls    GERD (gastroesophageal reflux disease)    H/O back injury    History of bronchitis    History of colon polyps    Hypertension    Imbalance    Kidney cysts    pt states not sure which kidney does see kidney specialist yearly pt states every thing okay currently    Legally blind in right eye, as defined in [place]     Mobitz type II atrioventricular block 11/08/2016   Multiple gastric ulcers    Obesity    OSA (obstructive sleep apnea) 05/03/2022   Pacemaker S/P [employer] Jude [employer]urity MRI model [id number] 05/20/2016   Renal stone    Retinal vein occlusion    Shortness of breath    Sinus node dysfunction (HCC) 12/16/2018   Tingling    left arm    Trace cataracts    Urinary frequency    Vitamin D  deficiency    Past Surgical History:  Procedure Laterality Date   CARDIOVASCULAR STRESS TEST  dec 2016   CHOLECYSTECTOMY     COLONOSCOPY  2011   EYE SURGERY Bilateral    Cataract surgery.    LEFT HEART CATH AND CORONARY ANGIOGRAPHY N/A 05/19/2016   Procedure: Left Heart Cath and Coronary Angiography;  Surgeon: Salena Negri, MD;  Location: [facility];  Service: Cardiovascular;  Laterality: N/A;   PACEMAKER IMPLANT N/A 05/20/2016   Procedure: Pacemaker Implant;  Surgeon: Will Gladis Norton, MD;  Location: [facility];  Service: Cardiovascular;  Laterality: N/A;   TONSILLECTOMY     TOTAL HIP ARTHROPLASTY Right 01/30/2015   Procedure: RIGHT TOTAL HIP ARTHROPLASTY ANTERIOR APPROACH AND REMOVAL LIPOMA RIGHT HIP;  Surgeon: Lonni CINDERELLA Poli, MD;  Location: [facility];  Service: Orthopedics;  Laterality: Right;   UPPER GI ENDOSCOPY     Patient Active Problem List   Diagnosis Date Noted   Back pain 12/26/2022   Fluid retention in legs 11/13/2022   Stress 06/14/2022   Obesity (HCC)- Start BMI 40.06 05/12/2022   OSA (obstructive sleep apnea) 05/03/2022   Hyperglycemia 03/15/2022   Heart block AV complete (HCC) 01/05/2022   Pulmonary hypertension (HCC) 11/19/2021   Chronic diastolic (congestive) heart failure (HCC) 06/01/2020   Pacemaker S/P St Jude Medical Assurity MRI model [id number] 05/20/2016   History of total right hip arthroplasty 01/30/2015   Arthritis 09/11/2014   Renal cyst 09/04/2014   Allergic rhinitis due to pollen 09/04/2014   Essential hypertension 07/08/2010   GERD (gastroesophageal reflux disease) 07/08/2010   Calcium oxalate renal stones 07/08/2010   Hyperlipidemia 07/08/2010    PCP: Norleen Jobs  REFERRING PROVIDER: Lonni Poli  REFERRING DIAG:  [phone] (ICD-10-CM) - History of total right hip arthroplasty  M54.41,G89.29 (ICD-10-CM) - Chronic right-sided low back pain with right-sided sciatica    Rationale for Evaluation and Treatment: Rehabilitation  THERAPY DIAG:  Radiculopathy, lumbosacral region  Bilateral hip pain  Muscle weakness (generalized)  ONSET DATE: 08/16/23 referral  SUBJECTIVE:                                                                                                                                                                                           SUBJECTIVE STATEMENT:   feeling better. Did well going out to eat last night without cane but curb was hard  PERTINENT HISTORY:  Patient is a  78 year old female who is almost 9 years out from a right total hip arthroplasty.  She comes in today ambulating with a cane.  She has been having a hard time getting up from a seated position and up from the floor when she is down on the floor.  She reports sciatic pain on the right side and also some left lower extremity and right lower extremity radicular types of symptoms.  She is not a diabetic.  She takes anti-inflammatories over-the-counter on a rare basis.  She denies any change in bowel or bladder function.   On exam both hips move smoothly and fluidly with no blocks or rotation.  Most of her pain seems to be with positive straight leg raise on both sides more so on the right than the left but she does have left lower extremity radicular symptoms as well.  PAIN:  Are you having pain? Yes: NPRS scale: 4/10 Pain location: both hips, into legs, some pain in low back Pain description: becoming more frequent and intense, electrical shock, achy  Aggravating factors: bending, standing too long Relieving factors: nothing really  PRECAUTIONS: None  RED FLAGS: None   WEIGHT BEARING RESTRICTIONS: No  FALLS:  Has patient fallen in last 6 months? No  LIVING ENVIRONMENT: Lives with: lives with their spouse Lives in: House/apartment Stairs: Yes: External: 5 steps; bilateral but cannot reach both Has following equipment at home: Single point cane and Walker - 2 wheeled  PLOF: Independent and Independent with basic ADLs  PATIENT GOALS: to not hurt and I don't want to risk falling  NEXT MD VISIT: 09/20/23  OBJECTIVE:  Note: Objective measures were completed at Evaluation unless otherwise noted.  DIAGNOSTIC FINDINGS:  2 views of the lumbar spine show a significant grade 1 spondylolisthesis at L4-L5 with some degenerative changes as well. An AP pelvis and lateral of the right hip shows a well-seated right total  hip arthroplasty. There is moderate arthritis of the left hip.     COGNITION: Overall cognitive status: Within functional limits for tasks assessed     SENSATION: WFL   POSTURE: rounded shoulders and forward head  PALPATION: Some tenderness in L2-L5  LUMBAR ROM:   AROM eval 09/28/23 10/03/23 10/20/23  Flexion Can touch toes, painful coming back up [facility] WFLS  Extension 25% with pain 25# with pain Limited 75% Limited 25%  Right lateral flexion Mid thigh with pain Limited 25% Limited 25% Limited 25%  Left lateral flexion Fibular head no pain Limited 25% Limited 25% WFLs  Right rotation 25% [facility] WFL   Left rotation 50% WFL WFL    (Blank rows = not tested)  LOWER EXTREMITY ROM:   grossly within functional ranges   LOWER EXTREMITY MMT:  grossly 5/5, some pain with resisted knee extension on RLE, hip extension 4/5  LUMBAR SPECIAL TESTS:  Straight leg raise test: Positive and FABER test: Positive  FUNCTIONAL TESTS:  5 times sit to stand: 10.39s Timed up and go (TUG): 15.53s w/o cane  GAIT: Distance walked: in clinic distances Assistive device utilized: Single point cane Level of assistance: Complete Independence  TREATMENT DATE:  11/03/23 Nustep L 5 STS with wt ball chest press 10 x Standing wt ball trunk rotation 10 x each side 1 lap with farmers carry each hand Obstacle course Side stepping laterally in /out 6 in box- CGA Side stepping foam bean 6 x HHA 5# SL alt SL hip flex,ext and abd Sitting 5# lateral step up onto 6 in 20 x each 5# marching on airex 20 x 2 sets  10/31/23 Amb 250 feet outside without AD without painn but SOB and multi standing rest breaks needed STS with wt ball chest press 10 x Standing wt ball trunk rotation 10 x each side Leg press 20# 3 sets 10 Resisted trunk flex and ext 20 x Seated row 20 # 2 sets 10 Resisted gait side stepping 30# 4 x each way ADD green tband side stepping 10 feet 5 x each Step up fwd 6 in opp leg ext 10 x then side step up with opp leg ABD HS curl 25# 2 sets 10 Knee ext  10 # 2 sets 10 5# lateral flexion standing 15 x each    10/26/23 Nustep L 5 Ball toss on airex- CGA Walked 200 feet with ball toss - no LOB 5# lateral flexion 15 x each Resisted trunk rotation 15 x each side- green tband STS with 5# chest press 10 x  Resisted trunk flex and ext seated 2 sets 10- blue tband Resisted gait side stepping 30# 4 x each way ADD ball squeeze 15 x Green tband clams and hip flex 2 sets 10 3# 6 in step taps 20 x 2 sets CGA 3# lateral step tap 12 each leg PROM, stretch and STW ( left IT and RT hip flexor) LE and trunk  10/24/23 5# farmers carry 1 lap each hand. Pain with wt in left hand 5# lateral flexion 15 x each side STS with 5# chest press 10 Nustep L 3 Step up 6 inch 5 x each leg, light hand support then 5 x more from airex Standing on airex ball toss- no LOB Lateral stepping over foam roll on iarex 10 x min a with LOB-setpping left harder Lateral steps 5 x each way with rail 4 in and 6 in 3# stepping over and into 6 inch box 5 x  fwd and latrally 3# 6 in step taps 20 x 2 sets CG-minA     10/20/23 Assessed ROM and goals Nustep L 3 Step over step 2 rail 4 inch 4 x Step over step 2 rail 6 inch 3 x Step up 5 x each leg 6 inch 1 rail Step down 5 x each leg 6 inch 2 rail- RT leg weaker Leg press 20# 3 sets 10 20# resisted gait 5 x fwd and back, 3 x each side STS with yellow wt ball 10 x HS curl 20# 2 sets 10 PROM LE and trunk with STW to RT quad/hip flexor and Left ITB  10/13/22\5 Green tband seated - HS curl , hip flex and clams 2 sets 10 Seated 3 # LAQ 2 sets 10 BIl Seated 3# ER/IR 10 x BIL HHA 3# marching fwd and back 15 feet 2 x HHA 3# side stepping 15 feet 2x 3# ankle wt alt step tap 6 inch 20 x with SPC 2 sets 3# ankle step tap laterally 6 inch 10 x each STS on airex CGA 10 x Foam beam in // bars tandem fwd and back and side stepping Gentle STW RT thigh/hip flex and left ITB Gentle PROM LE and trunk- cuing to  relax  10/05/23 PROM LE and trunk  STW RT hip flex and quad on a stretch STW to Left ITB on stretch Pelvic ROM and stab ex on dyna disc 4 in step up fwd with opp hip ext 10 x with UE 4 in step up with opp hip abd x 10 with UE Seated trunk flex and ext 2 sets 10 blue tband Educ in hip flex stretch off bed and adding rolling to RT thigh  10/03/23 PROM LE and trunk STW to distal ITB Feet on ball bridge,KTC and obl Green tband clams BIL 10 x then SL 10 x Green tband marching 10 x BIL  2 sets ADD ball squeeze 10 x then 10x with bridge SLR 10x BIL then SLR with abd 10 x 3# LAQ 20 x alt 3# hip flex 20 x alt 3# standing hip flex,ext and abd 10 x each  6in 3# step tap with SPC fwd and laterally 10 x each  09/28/23  Feet on ball bridge,KTC and obl- left knee catch with KTC, cuing with rotation to not twist so much Isometric abdominals 3 sec hold PROM LE and trunk  Left ITB stretching and STW- very tender. Instructed in rolling at home and stretching SL hip 3 way 10 x BIL In // bars on foam roll 3 x  laterally ADD ball squeeze 15 x Clams 2 sets 10 STS with wt ball OH 10 x    09/25/23 Walking 2 laps with SPC   Supine LTR, HS stretch, SKTC Feet on pball rotations and knees to chest  Ball squeeze 2x10 Bridges 2x10 STS 2x10 Calf stretch  Standing rows and ext red band 2x10  09/21/23  Declined Nustep- last time my pacemaker was really revving up and I was very concerned and surprised, I don't need that stress today   Spent some time discussing and educating on prior imaging findings (spondylolisthesis, hip OA, etc) and how it is possibly affecting (or not affecting) current pain levels   Supine hip flexor stretch 2x30 seconds B Supine frog stretch 2x30 seconds Piriformis stretch 2x30 seconds B  HS/gastroc stretch 2x30 seconds B  Seated TA set 15x3 seconds Seated TA set + mini-march x12 Seated TA set + opposite UE/LE flexion  x10B STS x10 no UEs cues for eccentric lower      09/14/23 Nustep L 5 LE only at 4 1/2 min decreased resistance to 3 Black tband trunk ext 2 sets 10 ADD ball squeeze 2 sets 10 Clams red tband 2 sets 10  Red tband hip flex 2 sets 10 Iso abdominals 2 set s 10 Wt ball chest press then obl 10 x each seated 3# alt LAQ 20 x 3 # hip abd with flexion 10 x BIL 2 sets  Standing 3# hip ext and abd 10 x Legs on ball bridge 2 sets 10 Legs on ball obl 20 x     09/05/23 EVAL                                                                                                                                PATIENT EDUCATION:  Education details: POC, HEP, sciatica, hip flexor tightness sleeping in recliners Person educated: Patient Education method: Medical illustrator Education comprehension: verbalized understanding and returned demonstration  HOME EXERCISE PROGRAM: Access Code: [email or web address] URL: [email or web address] Date: 10/03/2023 Prepared by: Kimanh Templeman  Exercises - Supine Bridge  - 1 x daily - 7 x weekly - 1 sets - 10 reps - 3 hold - Supine Hip Adduction Isometric with Ball  - 1 x daily - 7 x weekly - 2 sets - 10 reps - Supine March  - 1 x daily - 7 x weekly - 2 sets - 10 reps - Hooklying Clamshell with Resistance  - 1 x daily - 7 x weekly - 2 sets - 10 reps - Supine Straight Leg Raises  - 1 x daily - 7 x weekly - 3 sets - 10 reps - Seated Long Arc Quad  - 1 x daily - 7 x weekly - 2 sets - 10 reps - Seated March  - 1 x daily - 7 x weekly - 2 sets - 10 reps - Seated Hip Flexion and External Rotation  - 1 x daily - 7 x weekly - 2 sets - 10 reps - Standing March with Counter Support  - 1 x daily - 7 x weekly - 1 sets - 10 reps - Standing Hip Abduction with Counter Support  - 1 x daily - 7 x weekly - 1 sets - 10 reps - Standing Hip Extension with Counter Support  - 1 x daily - 7 x weekly - 1 sets - 10 reps Access Code: [email or web address] URL: [email or web address] Date: 09/05/2023 Prepared by: Almetta Fam  Exercises - Supine Lower Trunk Rotation  - 1 x daily - 7 x weekly - 2 sets - 10 reps - Supine Bridge  - 1 x daily - 7 x weekly - 2 sets - 10 reps - Supine Figure 4 Piriformis Stretch  - 1 x daily - 7 x weekly - 2 reps - 15 hold - Modified Thomas Stretch  - 1 x daily - 7 x  weekly - 2 reps - 15 hold  ASSESSMENT:  CLINICAL IMPRESSION:  pt is overall doing much better, stronger and moving with increased ease feeling of uneasiness and unsteadiness. Still occasional left knee catching. Goals progressing   OBJECTIVE IMPAIRMENTS: Abnormal gait, decreased activity tolerance, decreased balance, decreased ROM, decreased strength, and pain.   ACTIVITY LIMITATIONS: bending, squatting, stairs, and locomotion level  PARTICIPATION LIMITATIONS: cleaning, laundry, driving, shopping, community activity, and yard work  PERSONAL FACTORS: Age, Past/current experiences, Time since onset of injury/illness/exacerbation, and 1-2 comorbidities: arthritis, hx of surgery are also affecting patient's functional outcome.   REHAB POTENTIAL: Good  CLINICAL DECISION MAKING: Stable/uncomplicated  EVALUATION COMPLEXITY: Low  GOALS: Goals reviewed with patient? Yes  SHORT TERM GOALS: Target date: 10/10/23  Patient will be independent with initial HEP.  Baseline:  Goal status: 09/14/23 progressing  09/28/23 MET  2.  Patient will report centralization of radicular symptoms.  Baseline: pain in both legs Goal status: 09/28/23 no changes   10/03/23 progressing   MET 10/20/23   LONG TERM GOALS: Target date: 11/14/23  Patient will be independent with advanced/ongoing HEP to improve outcomes and carryover.  Baseline:  Goal status: MET 10/20/23 btwn HEP and ex class  2.  Patient will report 50-75% improvement in low back pain to improve QOL.  Baseline: 5/10 Goal status: 10/03/23 progressing  10/20/23 40% progressing and 10/24/23 10/31/23 progressing  3.  Patient will demonstrate full pain free lumbar ROM to perform  ADLs.   Baseline: see chart Goal status: 10/03/23 progressing  and 10/20/23  and 10/24/23. 9/9 WFL except ext limited 50%  4.  Patient will be up and down a set of stairs without pain.  Baseline: pain going up and down Goal status: ongoing 10/03/23  progressing 10/20/23  and 10/24/23  5.  Patient will be able to sleep in her bed again for at least 3-4 hrs.  Baseline: 1 hr tolerance  Goal status: 10/03/23 progressing  and 10/20/23. 10/31/23 progressing slowly- hard to get comfortable  6.  Patient will tolerate 30 min of standing to perform ADLs and household chores Baseline: ~10 mins Goal status: 10/03/23 progressing 10/20/23 progressing 15-20  . MET 10/24/23 pt stated stated did 30-45 min   PLAN:  PT FREQUENCY: 2x/week  PT DURATION: 10 weeks  PLANNED INTERVENTIONS: 97110-Therapeutic exercises, 97530- Therapeutic activity, 97112- Neuromuscular re-education, 97535- Self Care, 02859- Manual therapy, [phone]- Gait training, [phone]- Electrical stimulation (unattended), 97016- Vasopneumatic device, [id number]- Traction (mechanical), [phone] (1-2 muscles), 20561 (3+ muscles)- Dry Needling, Patient/Family education, Balance training, Stair training, Taping, Joint mobilization, Spinal mobilization, Cryotherapy, and Moist heat.  PLAN FOR NEXT SESSION: progress func strength and BALANCE   Jon Maybelline Kolarik PTA 11/03/23 10:09 AM

## 2023-11-06 ENCOUNTER — Telehealth (INDEPENDENT_AMBULATORY_CARE_PROVIDER_SITE_OTHER): Payer: PRIVATE HEALTH INSURANCE | Admitting: Psychology

## 2023-11-06 DIAGNOSIS — F5089 Other specified eating disorder: Secondary | ICD-10-CM

## 2023-11-06 DIAGNOSIS — F32A Depression, unspecified: Secondary | ICD-10-CM | POA: Diagnosis not present

## 2023-11-06 DIAGNOSIS — F419 Anxiety disorder, unspecified: Secondary | ICD-10-CM

## 2023-11-06 NOTE — Progress Notes (Signed)
  Office: [phone]  /  Fax: [phone]    Date: November 06, 2023  Appointment Start Time: 12:30pm Duration: 24 minutes Provider: Wyatt Fire, Psy.D. Type of Session: Individual Therapy  Location of Patient: Home (private location) Location of Provider: Provider's Home (private office) Type of Contact: Telepsychological Visit via MyChart Video Visit  Session Content: Lada is a 78 y.o. female presenting for a follow-up appointment to address the previously established treatment goal of increasing coping skills. Today's appointment was a telepsychological visit. Aya provided verbal consent for today's telepsychological appointment and she is aware she is responsible for securing confidentiality on her end of the session. Prior to proceeding with today's appointment, Karlei's physical location at the time of this appointment was obtained as well a phone number she could be reached at in the event of technical difficulties. Clarece and this provider participated in today's telepsychological service.   This provider conducted a brief check-in. Enyah shared rehab is physically and emotionally exhausting resulting in her sleeping more and eating what her husband cooks. She also discussed being just so wobbly and is at an increased risk for falls. This provider reviewed her role with the clinic and recommended traditional therapeutic services due to ongoing stressors. She noted, I don't know I'm ready for that yet. Ambivalence further explored and processed. She was receptive to referrals. Overall, Jazmon was receptive to today's appointment as evidenced by openness to sharing and responsiveness to feedback.  Mental Status Examination:  Appearance: neat Behavior: appropriate to circumstances Mood: sad Affect: mood congruent Speech: WNL Eye Contact: appropriate Psychomotor Activity: WNL Gait: unable to assess Thought Process: linear, logical, and goal directed and no evidence or  endorsement of suicidal, homicidal, and self-harm ideation, plan and intent  Thought Content/Perception: no hallucinations, delusions, bizarre thinking or behavior endorsed or observed Orientation: AAOx4 Memory/Concentration: intact Insight: fair Judgment: fair  Interventions:  Conducted a brief chart review Provided empathic reflections and validation Employed supportive psychotherapy interventions to facilitate reduced distress and to improve coping skills with identified stressors Recommended traditional therapeutic services  DSM-5 Diagnosis(es): F50.89 Other Specified Feeding or Eating Disorder, Emotional Eating Behaviors, F41.9 Unspecified Anxiety Disorder, and  F32.A Unspecified Depressive Disorder  Treatment Goal & Progress: During the initial appointment with this provider, the following treatment goal was established: increase coping skills. Anairis has demonstrated progress in her goal as evidenced by increased awareness of hunger patterns. Shaniah also continues to demonstrate willingness to engage in learned skills (e.g., journaling, mindfulness).   Plan: Per Ryleah's request, the next appointment is scheduled for 12/12/2023 at 10:00am, which will be via MyChart Video Visit. The next session will focus on working towards the established treatment goal. Girl provided verbal consent for this provider to e-mail referral options.    Wyatt Fire, PsyD

## 2023-11-06 NOTE — Progress Notes (Signed)
 Remote PPM Transmission

## 2023-11-06 NOTE — Progress Notes (Unsigned)
 SUBJECTIVE: Discussed the use of AI scribe software for clinical note transcription with the patient, who gave verbal consent to proceed.  Chief Complaint: Obesity  Interim History: She is up 4 lbs since her last visit. Down 18 lbs overall TBW loss of 8.5%  Dorine is here to discuss her progress with her obesity treatment plan. She is on the Category 1 Plan and states she is following her eating plan approximately 20 % of the time. She states she is exercising Going to Physical therapy for sciatica 40+minutes 2 times per week.  Leatrice G Amparo is a 78 year old female with obesity who presents for follow-up of her obesity treatment plan.  She experiences ongoing hip and back pain. She has been doing physical therapy, but she feels it worsened her back pain initially. She does feel it may be getting a bit better now and is hopeful to continue with PT. She describes stiffness on one side and inflammation in a large muscle in her leg, treated with physical therapy and massage using a rolling pin by her husband. Despite these efforts, she continues to experience significant pain and muscle spasms, impacting her ability to attend exercise classes regularly.  She has been taking Wellbutrin  for emotional eating but notes no noticeable improvement in energy or cravings. Her blood pressure has been fluctuating, with some instances of it being too low to take her Cardizem . She monitors her blood pressure daily and adjusts her medication accordingly- holding cardizem  as directed. Her BP was up today, but she did hold medication yesterday as her BP was low.   She is concerned about her husband who has been experiencing health issues, including fluid retention due to the beginnings of congestive heart failure, managed with diuretics. He has lost 35 pounds of fluid weight but still has some fluid retention. She notes that he has short-term memory issues and requires reminders for daily tasks.  She  experiences fatigue and has been sleeping more than usual, which she attributes to her current health challenges. Despite this, she is able to sleep at night, although her sleep is sometimes disrupted by her husband's conversations and frequent bathroom trips. She is not taking pain medication regularly and prefers to manage her pain through physical therapy and other non-pharmacological methods.  She is planning a trip to the beach and is concerned about her ability to drive long distances due to her current physical limitations. She has been able to stand and cook for about an hour to an hour and a half but finds it exhausting and experiences increased pain afterward. OBJECTIVE: Visit Diagnoses: Problem List Items Addressed This Visit     Essential hypertension   Obesity (HCC)- Start BMI 40.06   Stress   Relevant Medications   buPROPion  (WELLBUTRIN  SR) 150 MG 12 hr tablet   Other Visit Diagnoses       Lumbar radicular pain    -  Primary     BMI 36.0-36.9,adult Current BMI 36.7          Obesity with emotional eating Obesity with emotional eating. Current bupropion  treatment shows no significant improvement in cravings or energy levels. - Increase bupropion  to 150 mg daily. - Monitor blood pressure regularly at home. - Educate on potential side effects of bupropion , including increased blood pressure and insomnia. - Encourage meal planning and preparation as tolerated given decreased tolerance with standing to prep meals.   Stress/-emotional eating Increased stress-related symptoms, possibly exacerbated by recent physical health challenges and  caregiving responsibilities. Current bupropion  treatment may aid stress management and emotional eating. Today's BP elevated as patient reports BP lower yesterday and did not take usual medication- diltiazem  due to lower BP.  BP earlier today at home 124/72. Feels well.  - Increase bupropion  SR to 150 mg daily. - monitor BP closely for potential  increase with increased medications.  Meds ordered this encounter  Medications   buPROPion  (WELLBUTRIN  SR) 150 MG 12 hr tablet    Sig: Take 1 tablet (150 mg total) by mouth daily.    Dispense:  30 tablet    Refill:  1    Lumbar radicular pain syndrome pain in the hip and leg, possibly due to sciatica, currently undergoing physical therapy. Reports stiffness and muscle inflammation. Improvement in strength noted, but significant pain and muscle spasms persist. Plan: continue PT as directed.  She is hopeful to be able to continue as she does feel it is getting some better and she feels a little stronger.    Essential hypertension Essential hypertension with fluctuating blood pressure readings. Recent low readings led to holding diltiazem . Bupropion 's potential impact on blood pressure necessitates close monitoring.Today's BP elevated as patient reports BP lower yesterday and did not take usual medication- diltiazem .  BP earlier today at home 124/72. Feels well.  - Monitor blood pressure daily at home. - Take diltiazem  as needed based on blood pressure readings. - Contact provider if blood pressure consistently exceeds 150/90 mmHg. Vitals Temp: 97.6 F (36.4 C) BP: (!) 156/90 Pulse Rate: 81 SpO2: 90 %   Anthropometric Measurements Height: 5' 1 (1.549 m) Weight: 194 lb (88 kg) BMI (Calculated): 36.67 Weight at Last Visit: 190 lb Weight Lost Since Last Visit: 0 Weight Gained Since Last Visit: 4 lb Starting Weight: 212 lb Total Weight Loss (lbs): 18 lb (8.165 kg) Peak Weight: 307 lb   Body Composition  Body Fat %: 49.3 % Fat Mass (lbs): 95.6 lbs Muscle Mass (lbs): 93.4 lbs Total Body Water (lbs): 75.6 lbs Visceral Fat Rating : 17   Other Clinical Data Fasting: No Labs: No Today's Visit #: 27 Starting Date: 03/26/19     ASSESSMENT AND PLAN:  Diet: Latressa is currently in the action stage of change. As such, her goal is to get back to weight loss efforts. She has  agreed to Category 2 Plan.  Exercise: Christi has been instructed to try a geriatric exercise plan, that some exercise is better than none, and continue working with PT for safe exercises  for weight loss and overall health benefits.   Behavior Modification:  We discussed the following Behavioral Modification Strategies today: increasing lean protein intake, decreasing simple carbohydrates, increasing vegetables, increase H2O intake, increase high fiber foods, no skipping meals, meal planning and cooking strategies, emotional eating strategies , avoiding temptations, and planning for success. We discussed various medication options to help Camika with her weight loss efforts and we both agreed to continue current treatment plan and increase Wellbutrin  SR to 150 mg daily for emotional eating/stress and monitor BP closely.  Return in about 4 weeks (around 12/05/2023).SABRA She was informed of the importance of frequent follow up visits to maximize her success with intensive lifestyle modifications for her multiple health conditions.  Attestation Statements:   Reviewed by clinician on day of visit: allergies, medications, problem list, medical history, surgical history, family history, social history, and previous encounter notes.   Time spent on visit including pre-visit chart review and post-visit care and charting was 39 minutes.  Amalee Olsen, PA-C

## 2023-11-07 ENCOUNTER — Ambulatory Visit (INDEPENDENT_AMBULATORY_CARE_PROVIDER_SITE_OTHER): Admitting: Physician Assistant

## 2023-11-07 ENCOUNTER — Encounter (INDEPENDENT_AMBULATORY_CARE_PROVIDER_SITE_OTHER): Payer: Self-pay | Admitting: Physician Assistant

## 2023-11-07 ENCOUNTER — Ambulatory Visit: Admitting: Physical Therapy

## 2023-11-07 VITALS — BP 156/90 | HR 81 | Temp 97.6°F | Ht 61.0 in | Wt 194.0 lb

## 2023-11-07 DIAGNOSIS — I10 Essential (primary) hypertension: Secondary | ICD-10-CM

## 2023-11-07 DIAGNOSIS — M6281 Muscle weakness (generalized): Secondary | ICD-10-CM | POA: Diagnosis not present

## 2023-11-07 DIAGNOSIS — E7800 Pure hypercholesterolemia, unspecified: Secondary | ICD-10-CM

## 2023-11-07 DIAGNOSIS — M25551 Pain in right hip: Secondary | ICD-10-CM | POA: Diagnosis not present

## 2023-11-07 DIAGNOSIS — M5417 Radiculopathy, lumbosacral region: Secondary | ICD-10-CM

## 2023-11-07 DIAGNOSIS — Z6836 Body mass index (BMI) 36.0-36.9, adult: Secondary | ICD-10-CM

## 2023-11-07 DIAGNOSIS — F5089 Other specified eating disorder: Secondary | ICD-10-CM

## 2023-11-07 DIAGNOSIS — F439 Reaction to severe stress, unspecified: Secondary | ICD-10-CM | POA: Diagnosis not present

## 2023-11-07 DIAGNOSIS — E559 Vitamin D deficiency, unspecified: Secondary | ICD-10-CM

## 2023-11-07 DIAGNOSIS — E669 Obesity, unspecified: Secondary | ICD-10-CM | POA: Diagnosis not present

## 2023-11-07 DIAGNOSIS — M5416 Radiculopathy, lumbar region: Secondary | ICD-10-CM

## 2023-11-07 DIAGNOSIS — M25552 Pain in left hip: Secondary | ICD-10-CM | POA: Diagnosis not present

## 2023-11-07 MED ORDER — BUPROPION HCL ER (SR) 150 MG PO TB12: 150.0000 mg | ORAL_TABLET | Freq: Every day | ORAL | 1 refills | Status: AC

## 2023-11-07 NOTE — Therapy (Signed)
 OUTPATIENT PHYSICAL THERAPY THORACOLUMBAR TREATMENT     Patient Name: Margaret Reese MRN: [id number] DOB:[birth year], 78 y.o., female Today's Date: 11/07/2023  END OF SESSION:  PT End of Session - 11/07/23 1430     Visit Number 14    Date for PT Re-Evaluation 11/14/23    Authorization Type Medicare    PT Start Time 1430    PT Stop Time 1510    PT Time Calculation (min) 40 min            Past Medical History:  Diagnosis Date   Allergy    Anxiety    Arthritis    Asthma    Complication of anesthesia    pt states has difficulty awakening; also has increased sinus drainage   Constipation    Edema, lower extremity    Encounter for interrogation of cardiac pacemaker 09/05/2018   Falls    GERD (gastroesophageal reflux disease)    H/O back injury    History of bronchitis    History of colon polyps    Hypertension    Imbalance    Kidney cysts    pt states not sure which kidney does see kidney specialist yearly pt states every thing okay currently    Legally blind in right eye, as defined in [place]     Mobitz type II atrioventricular block 11/08/2016   Multiple gastric ulcers    Obesity    OSA (obstructive sleep apnea) 05/03/2022   Pacemaker S/P [employer] Jude [employer]urity MRI model [id number] 05/20/2016   Renal stone    Retinal vein occlusion    Shortness of breath    Sinus node dysfunction (HCC) 12/16/2018   Tingling    left arm    Trace cataracts    Urinary frequency    Vitamin D  deficiency    Past Surgical History:  Procedure Laterality Date   CARDIOVASCULAR STRESS TEST  dec 2016   CHOLECYSTECTOMY     COLONOSCOPY  2011   EYE SURGERY Bilateral    Cataract surgery.    LEFT HEART CATH AND CORONARY ANGIOGRAPHY N/A 05/19/2016   Procedure: Left Heart Cath and Coronary Angiography;  Surgeon: Salena Negri, MD;  Location: [facility];  Service: Cardiovascular;  Laterality: N/A;   PACEMAKER IMPLANT N/A 05/20/2016   Procedure: Pacemaker Implant;  Surgeon: Will Gladis Norton, MD;  Location: [facility];  Service: Cardiovascular;  Laterality: N/A;   TONSILLECTOMY     TOTAL HIP ARTHROPLASTY Right 01/30/2015   Procedure: RIGHT TOTAL HIP ARTHROPLASTY ANTERIOR APPROACH AND REMOVAL LIPOMA RIGHT HIP;  Surgeon: Lonni CINDERELLA Poli, MD;  Location: [facility];  Service: Orthopedics;  Laterality: Right;   UPPER GI ENDOSCOPY     Patient Active Problem List   Diagnosis Date Noted   Back pain 12/26/2022   Fluid retention in legs 11/13/2022   Stress 06/14/2022   Obesity (HCC)- Start BMI 40.06 05/12/2022   OSA (obstructive sleep apnea) 05/03/2022   Hyperglycemia 03/15/2022   Heart block AV complete (HCC) 01/05/2022   Pulmonary hypertension (HCC) 11/19/2021   Chronic diastolic (congestive) heart failure (HCC) 06/01/2020   Pacemaker S/P St Jude Medical Assurity MRI model [id number] 05/20/2016   History of total right hip arthroplasty 01/30/2015   Arthritis 09/11/2014   Renal cyst 09/04/2014   Allergic rhinitis due to pollen 09/04/2014   Essential hypertension 07/08/2010   GERD (gastroesophageal reflux disease) 07/08/2010   Calcium oxalate renal stones 07/08/2010   Hyperlipidemia 07/08/2010    PCP: Norleen Jobs  REFERRING PROVIDER: Lonni Poli  REFERRING DIAG:  [phone] (ICD-10-CM) - History of total right hip arthroplasty  M54.41,G89.29 (ICD-10-CM) - Chronic right-sided low back pain with right-sided sciatica    Rationale for Evaluation and Treatment: Rehabilitation  THERAPY DIAG:  Radiculopathy, lumbosacral region  Bilateral hip pain  Muscle weakness (generalized)  ONSET DATE: 08/16/23 referral  SUBJECTIVE:                                                                                                                                                                                           SUBJECTIVE STATEMENT:    Doing pretty good   PERTINENT HISTORY:  Patient is a 78 year old female who is almost 9 years out from a right total hip  arthroplasty.  She comes in today ambulating with a cane.  She has been having a hard time getting up from a seated position and up from the floor when she is down on the floor.  She reports sciatic pain on the right side and also some left lower extremity and right lower extremity radicular types of symptoms.  She is not a diabetic.  She takes anti-inflammatories over-the-counter on a rare basis.  She denies any change in bowel or bladder function.   On exam both hips move smoothly and fluidly with no blocks or rotation.  Most of her pain seems to be with positive straight leg raise on both sides more so on the right than the left but she does have left lower extremity radicular symptoms as well.  PAIN:  Are you having pain? Yes: NPRS scale: 4/10 Pain location: both hips, into legs, some pain in low back Pain description: becoming more frequent and intense, electrical shock, achy  Aggravating factors: bending, standing too long Relieving factors: nothing really  PRECAUTIONS: None  RED FLAGS: None   WEIGHT BEARING RESTRICTIONS: No  FALLS:  Has patient fallen in last 6 months? No  LIVING ENVIRONMENT: Lives with: lives with their spouse Lives in: House/apartment Stairs: Yes: External: 5 steps; bilateral but cannot reach both Has following equipment at home: Single point cane and Walker - 2 wheeled  PLOF: Independent and Independent with basic ADLs  PATIENT GOALS: to not hurt and I don't want to risk falling  NEXT MD VISIT: 09/20/23  OBJECTIVE:  Note: Objective measures were completed at Evaluation unless otherwise noted.  DIAGNOSTIC FINDINGS:  2 views of the lumbar spine show a significant grade 1 spondylolisthesis at L4-L5 with some degenerative changes as well. An AP pelvis and lateral of the right hip shows a well-seated right total hip arthroplasty. There is moderate arthritis of the left hip.  COGNITION: Overall cognitive status: Within functional limits for tasks  assessed     SENSATION: WFL   POSTURE: rounded shoulders and forward head  PALPATION: Some tenderness in L2-L5  LUMBAR ROM:   AROM eval 09/28/23 10/03/23 10/20/23  Flexion Can touch toes, painful coming back up [facility] WFLS  Extension 25% with pain 25# with pain Limited 75% Limited 25%  Right lateral flexion Mid thigh with pain Limited 25% Limited 25% Limited 25%  Left lateral flexion Fibular head no pain Limited 25% Limited 25% WFLs  Right rotation 25% [facility] WFL   Left rotation 50% WFL WFL    (Blank rows = not tested)  LOWER EXTREMITY ROM:   grossly within functional ranges   LOWER EXTREMITY MMT:  grossly 5/5, some pain with resisted knee extension on RLE, hip extension 4/5  LUMBAR SPECIAL TESTS:  Straight leg raise test: Positive and FABER test: Positive  FUNCTIONAL TESTS:  5 times sit to stand: 10.39s Timed up and go (TUG): 15.53s w/o cane  GAIT: Distance walked: in clinic distances Assistive device utilized: Single point cane Level of assistance: Complete Independence  TREATMENT DATE:  11/17/23 5# cable pulley shld ext 2 sets 10 10# cable row 2 sets 10 10# cable pulleys hip 4 way 10x BIL Nustep L 5 Airex to 6 inch box step ups 5 x each leg then alternate 20 x- CGA and cuing HS curl 25# 2 sets 12 Knee ext 10 # 2 sets 12 Leg Press 30# 10x feet 3 way for hips STS with wt ball chest press 10 x then OH 10 x   11/03/23 Nustep L 5 STS with wt ball chest press 10 x Standing wt ball trunk rotation 10 x each side 1 lap with farmers carry each hand Obstacle course Side stepping laterally in /out 6 in box- CGA Side stepping foam bean 6 x HHA 5# SL alt SL hip flex,ext and abd Sitting 5# lateral step up onto 6 in 20 x each 5# marching on airex 20 x 2 sets  10/31/23 Amb 250 feet outside without AD without painn but SOB and multi standing rest breaks needed STS with wt ball chest press 10 x Standing wt ball trunk rotation 10 x each side Leg press 20# 3 sets  10 Resisted trunk flex and ext 20 x Seated row 20 # 2 sets 10 Resisted gait side stepping 30# 4 x each way ADD green tband side stepping 10 feet 5 x each Step up fwd 6 in opp leg ext 10 x then side step up with opp leg ABD HS curl 25# 2 sets 10 Knee ext 10 # 2 sets 10 5# lateral flexion standing 15 x each    10/26/23 Nustep L 5 Ball toss on airex- CGA Walked 200 feet with ball toss - no LOB 5# lateral flexion 15 x each Resisted trunk rotation 15 x each side- green tband STS with 5# chest press 10 x  Resisted trunk flex and ext seated 2 sets 10- blue tband Resisted gait side stepping 30# 4 x each way ADD ball squeeze 15 x Green tband clams and hip flex 2 sets 10 3# 6 in step taps 20 x 2 sets CGA 3# lateral step tap 12 each leg PROM, stretch and STW ( left IT and RT hip flexor) LE and trunk  10/24/23 5# farmers carry 1 lap each hand. Pain with wt in left hand 5# lateral flexion 15 x each side STS with 5# chest  press 10 Nustep L 3 Step up 6 inch 5 x each leg, light hand support then 5 x more from airex Standing on airex ball toss- no LOB Lateral stepping over foam roll on iarex 10 x min a with LOB-setpping left harder Lateral steps 5 x each way with rail 4 in and 6 in 3# stepping over and into 6 inch box 5 x fwd and latrally 3# 6 in step taps 20 x 2 sets CG-minA     10/20/23 Assessed ROM and goals Nustep L 3 Step over step 2 rail 4 inch 4 x Step over step 2 rail 6 inch 3 x Step up 5 x each leg 6 inch 1 rail Step down 5 x each leg 6 inch 2 rail- RT leg weaker Leg press 20# 3 sets 10 20# resisted gait 5 x fwd and back, 3 x each side STS with yellow wt ball 10 x HS curl 20# 2 sets 10 PROM LE and trunk with STW to RT quad/hip flexor and Left ITB  10/13/22\5 Green tband seated - HS curl , hip flex and clams 2 sets 10 Seated 3 # LAQ 2 sets 10 BIl Seated 3# ER/IR 10 x BIL HHA 3# marching fwd and back 15 feet 2 x HHA 3# side stepping 15 feet 2x 3# ankle wt  alt step tap 6 inch 20 x with SPC 2 sets 3# ankle step tap laterally 6 inch 10 x each STS on airex CGA 10 x Foam beam in // bars tandem fwd and back and side stepping Gentle STW RT thigh/hip flex and left ITB Gentle PROM LE and trunk- cuing to relax  10/05/23 PROM LE and trunk  STW RT hip flex and quad on a stretch STW to Left ITB on stretch Pelvic ROM and stab ex on dyna disc 4 in step up fwd with opp hip ext 10 x with UE 4 in step up with opp hip abd x 10 with UE Seated trunk flex and ext 2 sets 10 blue tband Educ in hip flex stretch off bed and adding rolling to RT thigh  10/03/23 PROM LE and trunk STW to distal ITB Feet on ball bridge,KTC and obl Green tband clams BIL 10 x then SL 10 x Green tband marching 10 x BIL  2 sets ADD ball squeeze 10 x then 10x with bridge SLR 10x BIL then SLR with abd 10 x 3# LAQ 20 x alt 3# hip flex 20 x alt 3# standing hip flex,ext and abd 10 x each  6in 3# step tap with SPC fwd and laterally 10 x each  09/28/23  Feet on ball bridge,KTC and obl- left knee catch with KTC, cuing with rotation to not twist so much Isometric abdominals 3 sec hold PROM LE and trunk  Left ITB stretching and STW- very tender. Instructed in rolling at home and stretching SL hip 3 way 10 x BIL In // bars on foam roll 3 x  laterally ADD ball squeeze 15 x Clams 2 sets 10 STS with wt ball OH 10 x    09/25/23 Walking 2 laps with SPC   Supine LTR, HS stretch, SKTC Feet on pball rotations and knees to chest  Ball squeeze 2x10 Bridges 2x10 STS 2x10 Calf stretch  Standing rows and ext red band 2x10  09/21/23  Declined Nustep- last time my pacemaker was really revving up and I was very concerned and surprised, I don't need that stress today   Spent some  time discussing and educating on prior imaging findings (spondylolisthesis, hip OA, etc) and how it is possibly affecting (or not affecting) current pain levels   Supine hip flexor stretch 2x30 seconds B Supine  frog stretch 2x30 seconds Piriformis stretch 2x30 seconds B  HS/gastroc stretch 2x30 seconds B  Seated TA set 15x3 seconds Seated TA set + mini-march x12 Seated TA set + opposite UE/LE flexion x10B STS x10 no UEs cues for eccentric lower     09/14/23 Nustep L 5 LE only at 4 1/2 min decreased resistance to 3 Black tband trunk ext 2 sets 10 ADD ball squeeze 2 sets 10 Clams red tband 2 sets 10  Red tband hip flex 2 sets 10 Iso abdominals 2 set s 10 Wt ball chest press then obl 10 x each seated 3# alt LAQ 20 x 3 # hip abd with flexion 10 x BIL 2 sets  Standing 3# hip ext and abd 10 x Legs on ball bridge 2 sets 10 Legs on ball obl 20 x     09/05/23 EVAL                                                                                                                                PATIENT EDUCATION:  Education details: POC, HEP, sciatica, hip flexor tightness sleeping in recliners Person educated: Patient Education method: Medical illustrator Education comprehension: verbalized understanding and returned demonstration  HOME EXERCISE PROGRAM: Access Code: [email or web address] URL: [email or web address] Date: 10/03/2023 Prepared by: Giovana Faciane  Exercises - Supine Bridge  - 1 x daily - 7 x weekly - 1 sets - 10 reps - 3 hold - Supine Hip Adduction Isometric with Ball  - 1 x daily - 7 x weekly - 2 sets - 10 reps - Supine March  - 1 x daily - 7 x weekly - 2 sets - 10 reps - Hooklying Clamshell with Resistance  - 1 x daily - 7 x weekly - 2 sets - 10 reps - Supine Straight Leg Raises  - 1 x daily - 7 x weekly - 3 sets - 10 reps - Seated Long Arc Quad  - 1 x daily - 7 x weekly - 2 sets - 10 reps - Seated March  - 1 x daily - 7 x weekly - 2 sets - 10 reps - Seated Hip Flexion and External Rotation  - 1 x daily - 7 x weekly - 2 sets - 10 reps - Standing March with Counter Support  - 1 x daily - 7 x weekly - 1 sets - 10 reps - Standing Hip Abduction with Counter  Support  - 1 x daily - 7 x weekly - 1 sets - 10 reps - Standing Hip Extension with Counter Support  - 1 x daily - 7 x weekly - 1 sets - 10 reps Access Code: [email or web address] URL: [email or web address] Date: 09/05/2023 Prepared by: Almetta Fam  Exercises - Supine Lower  Trunk Rotation  - 1 x daily - 7 x weekly - 2 sets - 10 reps - Supine Bridge  - 1 x daily - 7 x weekly - 2 sets - 10 reps - Supine Figure 4 Piriformis Stretch  - 1 x daily - 7 x weekly - 2 reps - 15 hold - Modified Thomas Stretch  - 1 x daily - 7 x weekly - 2 reps - 15 hold  ASSESSMENT:  CLINICAL IMPRESSION:  focus session more on strengthening today. Pt is getting stronger as noted by ease of mvmt. Walker quicker without AD. Transition on/off machines easier.cuing to engage core with ex  OBJECTIVE IMPAIRMENTS: Abnormal gait, decreased activity tolerance, decreased balance, decreased ROM, decreased strength, and pain.   ACTIVITY LIMITATIONS: bending, squatting, stairs, and locomotion level  PARTICIPATION LIMITATIONS: cleaning, laundry, driving, shopping, community activity, and yard work  PERSONAL FACTORS: Age, Past/current experiences, Time since onset of injury/illness/exacerbation, and 1-2 comorbidities: arthritis, hx of surgery are also affecting patient's functional outcome.   REHAB POTENTIAL: Good  CLINICAL DECISION MAKING: Stable/uncomplicated  EVALUATION COMPLEXITY: Low  GOALS: Goals reviewed with patient? Yes  SHORT TERM GOALS: Target date: 10/10/23  Patient will be independent with initial HEP.  Baseline:  Goal status: 09/14/23 progressing  09/28/23 MET  2.  Patient will report centralization of radicular symptoms.  Baseline: pain in both legs Goal status: 09/28/23 no changes   10/03/23 progressing   MET 10/20/23   LONG TERM GOALS: Target date: 11/14/23  Patient will be independent with advanced/ongoing HEP to improve outcomes and carryover.  Baseline:  Goal status: MET 10/20/23 btwn HEP and ex  class  2.  Patient will report 50-75% improvement in low back pain to improve QOL.  Baseline: 5/10 Goal status: 10/03/23 progressing  10/20/23 40% progressing and 10/24/23 10/31/23 progressing  3.  Patient will demonstrate full pain free lumbar ROM to perform ADLs.   Baseline: see chart Goal status: 10/03/23 progressing  and 10/20/23  and 10/24/23. 9/9 WFL except ext limited 50%  4.  Patient will be up and down a set of stairs without pain.  Baseline: pain going up and down Goal status: ongoing 10/03/23  progressing 10/20/23  and 10/24/23  5.  Patient will be able to sleep in her bed again for at least 3-4 hrs.  Baseline: 1 hr tolerance  Goal status: 10/03/23 progressing  and 10/20/23. 10/31/23 progressing slowly- hard to get comfortable  6.  Patient will tolerate 30 min of standing to perform ADLs and household chores Baseline: ~10 mins Goal status: 10/03/23 progressing 10/20/23 progressing 15-20  . MET 10/24/23 pt stated stated did 30-45 min   PLAN:  PT FREQUENCY: 2x/week  PT DURATION: 10 weeks  PLANNED INTERVENTIONS: 97110-Therapeutic exercises, 97530- Therapeutic activity, 97112- Neuromuscular re-education, 97535- Self Care, [id number]- Manual therapy, [phone]- Gait training, [phone]- Electrical stimulation (unattended), [id number]- Vasopneumatic device, [id number]- Traction (mechanical), [phone] (1-2 muscles), [id number] (3+ muscles)- Dry Needling, Patient/Family education, Balance training, Stair training, Taping, Joint mobilization, Spinal mobilization, Cryotherapy, and Moist heat.  PLAN FOR NEXT SESSION: progress func strength and BALANCE   Jon Gizella [place] PTA 11/07/23 3:14 PM

## 2023-11-10 ENCOUNTER — Ambulatory Visit: Admitting: Physical Therapy

## 2023-11-10 DIAGNOSIS — M25552 Pain in left hip: Secondary | ICD-10-CM | POA: Diagnosis not present

## 2023-11-10 DIAGNOSIS — M25551 Pain in right hip: Secondary | ICD-10-CM | POA: Diagnosis not present

## 2023-11-10 DIAGNOSIS — M6281 Muscle weakness (generalized): Secondary | ICD-10-CM

## 2023-11-10 DIAGNOSIS — M5417 Radiculopathy, lumbosacral region: Secondary | ICD-10-CM

## 2023-11-10 NOTE — Therapy (Signed)
 OUTPATIENT PHYSICAL THERAPY THORACOLUMBAR TREATMENT     Patient Name: Margaret Reese MRN: [id number] DOB:[birth year], 78 y.o., female Today's Date: 11/10/2023  END OF SESSION:  PT End of Session - 11/10/23 0913     Visit Number 15    Date for Recertification  11/14/23    Authorization Type Medicare    PT Start Time 0930    PT Stop Time 1015    PT Time Calculation (min) 45 min            Past Medical History:  Diagnosis Date   Allergy    Anxiety    Arthritis    Asthma    Complication of anesthesia    pt states has difficulty awakening; also has increased sinus drainage   Constipation    Edema, lower extremity    Encounter for interrogation of cardiac pacemaker 09/05/2018   Falls    GERD (gastroesophageal reflux disease)    H/O back injury    History of bronchitis    History of colon polyps    Hypertension    Imbalance    Kidney cysts    pt states not sure which kidney does see kidney specialist yearly pt states every thing okay currently    Legally blind in right eye, as defined in [place]     Mobitz type II atrioventricular block 11/08/2016   Multiple gastric ulcers    Obesity    OSA (obstructive sleep apnea) 05/03/2022   Pacemaker S/P [employer] Jude [employer]urity MRI model [id number] 05/20/2016   Renal stone    Retinal vein occlusion    Shortness of breath    Sinus node dysfunction (HCC) 12/16/2018   Tingling    left arm    Trace cataracts    Urinary frequency    Vitamin D  deficiency    Past Surgical History:  Procedure Laterality Date   CARDIOVASCULAR STRESS TEST  dec 2016   CHOLECYSTECTOMY     COLONOSCOPY  2011   EYE SURGERY Bilateral    Cataract surgery.    LEFT HEART CATH AND CORONARY ANGIOGRAPHY N/A 05/19/2016   Procedure: Left Heart Cath and Coronary Angiography;  Surgeon: Salena Negri, MD;  Location: [facility];  Service: Cardiovascular;  Laterality: N/A;   PACEMAKER IMPLANT N/A 05/20/2016   Procedure: Pacemaker Implant;  Surgeon: Will Gladis Norton, MD;  Location: [facility];  Service: Cardiovascular;  Laterality: N/A;   TONSILLECTOMY     TOTAL HIP ARTHROPLASTY Right 01/30/2015   Procedure: RIGHT TOTAL HIP ARTHROPLASTY ANTERIOR APPROACH AND REMOVAL LIPOMA RIGHT HIP;  Surgeon: Lonni CINDERELLA Poli, MD;  Location: [facility];  Service: Orthopedics;  Laterality: Right;   UPPER GI ENDOSCOPY     Patient Active Problem List   Diagnosis Date Noted   Back pain 12/26/2022   Fluid retention in legs 11/13/2022   Stress 06/14/2022   Obesity (HCC)- Start BMI 40.06 05/12/2022   OSA (obstructive sleep apnea) 05/03/2022   Hyperglycemia 03/15/2022   Heart block AV complete (HCC) 01/05/2022   Pulmonary hypertension (HCC) 11/19/2021   Chronic diastolic (congestive) heart failure (HCC) 06/01/2020   Pacemaker S/P St Jude Medical Assurity MRI model [id number] 05/20/2016   History of total right hip arthroplasty 01/30/2015   Arthritis 09/11/2014   Renal cyst 09/04/2014   Allergic rhinitis due to pollen 09/04/2014   Essential hypertension 07/08/2010   GERD (gastroesophageal reflux disease) 07/08/2010   Calcium oxalate renal stones 07/08/2010   Hyperlipidemia 07/08/2010    PCP: Norleen Jobs  REFERRING PROVIDER: Lonni Poli  REFERRING DIAG:  [phone] (ICD-10-CM) - History of total right hip arthroplasty  M54.41,G89.29 (ICD-10-CM) - Chronic right-sided low back pain with right-sided sciatica    Rationale for Evaluation and Treatment: Rehabilitation  THERAPY DIAG:  Radiculopathy, lumbosacral region  Bilateral hip pain  Muscle weakness (generalized)  ONSET DATE: 08/16/23 referral  SUBJECTIVE:                                                                                                                                                                                           SUBJECTIVE STATEMENT:    Overall better, left knee still catches but if I press just right it eases off   PERTINENT HISTORY:  Patient is a  78 year old female who is almost 9 years out from a right total hip arthroplasty.  She comes in today ambulating with a cane.  She has been having a hard time getting up from a seated position and up from the floor when she is down on the floor.  She reports sciatic pain on the right side and also some left lower extremity and right lower extremity radicular types of symptoms.  She is not a diabetic.  She takes anti-inflammatories over-the-counter on a rare basis.  She denies any change in bowel or bladder function.   On exam both hips move smoothly and fluidly with no blocks or rotation.  Most of her pain seems to be with positive straight leg raise on both sides more so on the right than the left but she does have left lower extremity radicular symptoms as well.  PAIN:  Are you having pain? Yes: NPRS scale: 4/10 Pain location: both hips, into legs, some pain in low back Pain description: becoming more frequent and intense, electrical shock, achy  Aggravating factors: bending, standing too long Relieving factors: nothing really  PRECAUTIONS: None  RED FLAGS: None   WEIGHT BEARING RESTRICTIONS: No  FALLS:  Has patient fallen in last 6 months? No  LIVING ENVIRONMENT: Lives with: lives with their spouse Lives in: House/apartment Stairs: Yes: External: 5 steps; bilateral but cannot reach both Has following equipment at home: Single point cane and Walker - 2 wheeled  PLOF: Independent and Independent with basic ADLs  PATIENT GOALS: to not hurt and I don't want to risk falling  NEXT MD VISIT: 09/20/23  OBJECTIVE:  Note: Objective measures were completed at Evaluation unless otherwise noted.  DIAGNOSTIC FINDINGS:  2 views of the lumbar spine show a significant grade 1 spondylolisthesis at L4-L5 with some degenerative changes as well. An AP pelvis and lateral of the right hip shows a well-seated right  total hip arthroplasty. There is moderate arthritis of the left hip.     COGNITION: Overall cognitive status: Within functional limits for tasks assessed     SENSATION: WFL   POSTURE: rounded shoulders and forward head  PALPATION: Some tenderness in L2-L5  LUMBAR ROM:   AROM eval 09/28/23 10/03/23 10/20/23  Flexion Can touch toes, painful coming back up [facility] WFLS  Extension 25% with pain 25# with pain Limited 75% Limited 25%  Right lateral flexion Mid thigh with pain Limited 25% Limited 25% Limited 25%  Left lateral flexion Fibular head no pain Limited 25% Limited 25% WFLs  Right rotation 25% [facility] WFL   Left rotation 50% WFL WFL    (Blank rows = not tested)  LOWER EXTREMITY ROM:   grossly within functional ranges   LOWER EXTREMITY MMT:  grossly 5/5, some pain with resisted knee extension on RLE, hip extension 4/5  LUMBAR SPECIAL TESTS:  Straight leg raise test: Positive and FABER test: Positive  FUNCTIONAL TESTS:  5 times sit to stand: 10.39s Timed up and go (TUG): 15.53s w/o cane  GAIT: Distance walked: in clinic distances Assistive device utilized: Single point cane Level of assistance: Complete Independence  TREATMENT DATE:  11/10/23 Nustep L 5 VMO squeeze with LAQ 2 sets 10 Standing on airex vextor taps CGA HHA on airex stepping laterally over rolls Double HHA to single HHA - CG-min A Obstacle course CGA with 1 LOB requiring min A to prevent fall Agility ladder- CGA 6 inch step up 5 x each leg without UE Up and down 2 steps 1 step at a time no hands cg- min A  11/07/23 5# cable pulley shld ext 2 sets 10 10# cable row 2 sets 10 10# cable pulleys hip 4 way 10x BIL Nustep L 5 Airex to 6 inch box step ups 5 x each leg then alternate 20 x- CGA and cuing HS curl 25# 2 sets 12 Knee ext 10 # 2 sets 12 Leg Press 30# 10x feet 3 way for hips STS with wt ball chest press 10 x then OH 10 x   11/03/23 Nustep L 5 STS with wt ball chest press 10 x Standing wt ball trunk rotation 10 x each side 1 lap with farmers  carry each hand Obstacle course Side stepping laterally in /out 6 in box- CGA Side stepping foam bean 6 x HHA 5# SL alt SL hip flex,ext and abd Sitting 5# lateral step up onto 6 in 20 x each 5# marching on airex 20 x 2 sets  10/31/23 Amb 250 feet outside without AD without painn but SOB and multi standing rest breaks needed STS with wt ball chest press 10 x Standing wt ball trunk rotation 10 x each side Leg press 20# 3 sets 10 Resisted trunk flex and ext 20 x Seated row 20 # 2 sets 10 Resisted gait side stepping 30# 4 x each way ADD green tband side stepping 10 feet 5 x each Step up fwd 6 in opp leg ext 10 x then side step up with opp leg ABD HS curl 25# 2 sets 10 Knee ext 10 # 2 sets 10 5# lateral flexion standing 15 x each    10/26/23 Nustep L 5 Ball toss on airex- CGA Walked 200 feet with ball toss - no LOB 5# lateral flexion 15 x each Resisted trunk rotation 15 x each side- green tband STS with 5# chest press 10 x  Resisted trunk flex  and ext seated 2 sets 10- blue tband Resisted gait side stepping 30# 4 x each way ADD ball squeeze 15 x Green tband clams and hip flex 2 sets 10 3# 6 in step taps 20 x 2 sets CGA 3# lateral step tap 12 each leg PROM, stretch and STW ( left IT and RT hip flexor) LE and trunk  10/24/23 5# farmers carry 1 lap each hand. Pain with wt in left hand 5# lateral flexion 15 x each side STS with 5# chest press 10 Nustep L 3 Step up 6 inch 5 x each leg, light hand support then 5 x more from airex Standing on airex ball toss- no LOB Lateral stepping over foam roll on iarex 10 x min a with LOB-setpping left harder Lateral steps 5 x each way with rail 4 in and 6 in 3# stepping over and into 6 inch box 5 x fwd and latrally 3# 6 in step taps 20 x 2 sets CG-minA     10/20/23 Assessed ROM and goals Nustep L 3 Step over step 2 rail 4 inch 4 x Step over step 2 rail 6 inch 3 x Step up 5 x each leg 6 inch 1 rail Step down 5 x each leg 6  inch 2 rail- RT leg weaker Leg press 20# 3 sets 10 20# resisted gait 5 x fwd and back, 3 x each side STS with yellow wt ball 10 x HS curl 20# 2 sets 10 PROM LE and trunk with STW to RT quad/hip flexor and Left ITB  10/13/22\5 Green tband seated - HS curl , hip flex and clams 2 sets 10 Seated 3 # LAQ 2 sets 10 BIl Seated 3# ER/IR 10 x BIL HHA 3# marching fwd and back 15 feet 2 x HHA 3# side stepping 15 feet 2x 3# ankle wt alt step tap 6 inch 20 x with SPC 2 sets 3# ankle step tap laterally 6 inch 10 x each STS on airex CGA 10 x Foam beam in // bars tandem fwd and back and side stepping Gentle STW RT thigh/hip flex and left ITB Gentle PROM LE and trunk- cuing to relax  10/05/23 PROM LE and trunk  STW RT hip flex and quad on a stretch STW to Left ITB on stretch Pelvic ROM and stab ex on dyna disc 4 in step up fwd with opp hip ext 10 x with UE 4 in step up with opp hip abd x 10 with UE Seated trunk flex and ext 2 sets 10 blue tband Educ in hip flex stretch off bed and adding rolling to RT thigh  10/03/23 PROM LE and trunk STW to distal ITB Feet on ball bridge,KTC and obl Green tband clams BIL 10 x then SL 10 x Green tband marching 10 x BIL  2 sets ADD ball squeeze 10 x then 10x with bridge SLR 10x BIL then SLR with abd 10 x 3# LAQ 20 x alt 3# hip flex 20 x alt 3# standing hip flex,ext and abd 10 x each  6in 3# step tap with SPC fwd and laterally 10 x each  09/28/23  Feet on ball bridge,KTC and obl- left knee catch with KTC, cuing with rotation to not twist so much Isometric abdominals 3 sec hold PROM LE and trunk  Left ITB stretching and STW- very tender. Instructed in rolling at home and stretching SL hip 3 way 10 x BIL In // bars on foam roll 3 x  laterally  ADD ball squeeze 15 x Clams 2 sets 10 STS with wt ball OH 10 x    09/25/23 Walking 2 laps with SPC   Supine LTR, HS stretch, SKTC Feet on pball rotations and knees to chest  Ball squeeze 2x10 Bridges 2x10 STS  2x10 Calf stretch  Standing rows and ext red band 2x10  09/21/23  Declined Nustep- last time my pacemaker was really revving up and I was very concerned and surprised, I don't need that stress today   Spent some time discussing and educating on prior imaging findings (spondylolisthesis, hip OA, etc) and how it is possibly affecting (or not affecting) current pain levels   Supine hip flexor stretch 2x30 seconds B Supine frog stretch 2x30 seconds Piriformis stretch 2x30 seconds B  HS/gastroc stretch 2x30 seconds B  Seated TA set 15x3 seconds Seated TA set + mini-march x12 Seated TA set + opposite UE/LE flexion x10B STS x10 no UEs cues for eccentric lower     09/14/23 Nustep L 5 LE only at 4 1/2 min decreased resistance to 3 Black tband trunk ext 2 sets 10 ADD ball squeeze 2 sets 10 Clams red tband 2 sets 10  Red tband hip flex 2 sets 10 Iso abdominals 2 set s 10 Wt ball chest press then obl 10 x each seated 3# alt LAQ 20 x 3 # hip abd with flexion 10 x BIL 2 sets  Standing 3# hip ext and abd 10 x Legs on ball bridge 2 sets 10 Legs on ball obl 20 x     09/05/23 EVAL                                                                                                                                PATIENT EDUCATION:  Education details: POC, HEP, sciatica, hip flexor tightness sleeping in recliners Person educated: Patient Education method: Medical illustrator Education comprehension: verbalized understanding and returned demonstration  HOME EXERCISE PROGRAM: Access Code: [email or web address] URL: [email or web address] Date: 10/03/2023 Prepared by: Aine Strycharz  Exercises - Supine Bridge  - 1 x daily - 7 x weekly - 1 sets - 10 reps - 3 hold - Supine Hip Adduction Isometric with Ball  - 1 x daily - 7 x weekly - 2 sets - 10 reps - Supine March  - 1 x daily - 7 x weekly - 2 sets - 10 reps - Hooklying Clamshell with Resistance  - 1 x daily - 7 x weekly - 2  sets - 10 reps - Supine Straight Leg Raises  - 1 x daily - 7 x weekly - 3 sets - 10 reps - Seated Long Arc Quad  - 1 x daily - 7 x weekly - 2 sets - 10 reps - Seated March  - 1 x daily - 7 x weekly - 2 sets - 10 reps - Seated Hip Flexion and External Rotation  - 1 x daily - 7 x  weekly - 2 sets - 10 reps - Standing March with Counter Support  - 1 x daily - 7 x weekly - 1 sets - 10 reps - Standing Hip Abduction with Counter Support  - 1 x daily - 7 x weekly - 1 sets - 10 reps - Standing Hip Extension with Counter Support  - 1 x daily - 7 x weekly - 1 sets - 10 reps Access Code: [email or web address] URL: [email or web address] Date: 09/05/2023 Prepared by: Almetta Fam  Exercises - Supine Lower Trunk Rotation  - 1 x daily - 7 x weekly - 2 sets - 10 reps - Supine Bridge  - 1 x daily - 7 x weekly - 2 sets - 10 reps - Supine Figure 4 Piriformis Stretch  - 1 x daily - 7 x weekly - 2 reps - 15 hold - Modified Thomas Stretch  - 1 x daily - 7 x weekly - 2 reps - 15 hold  ASSESSMENT:  CLINICAL IMPRESSION:  pt overall pleased with progress. Still left knee catch but better. Pt struggles to relax for me to test patellar mobility but left patella sits lateral so thinking its an alignment issue and issued LAQ with VMO squeeze for home.Assessed goals. Pt would really like to continue and add more balance OBJECTIVE IMPAIRMENTS: Abnormal gait, decreased activity tolerance, decreased balance, decreased ROM, decreased strength, and pain.   ACTIVITY LIMITATIONS: bending, squatting, stairs, and locomotion level  PARTICIPATION LIMITATIONS: cleaning, laundry, driving, shopping, community activity, and yard work  PERSONAL FACTORS: Age, Past/current experiences, Time since onset of injury/illness/exacerbation, and 1-2 comorbidities: arthritis, hx of surgery are also affecting patient's functional outcome.   REHAB POTENTIAL: Good  CLINICAL DECISION MAKING: Stable/uncomplicated  EVALUATION COMPLEXITY:  Low  GOALS: Goals reviewed with patient? Yes  SHORT TERM GOALS: Target date: 10/10/23  Patient will be independent with initial HEP.  Baseline:  Goal status: 09/14/23 progressing  09/28/23 MET  2.  Patient will report centralization of radicular symptoms.  Baseline: pain in both legs Goal status: 09/28/23 no changes   10/03/23 progressing   MET 10/20/23   LONG TERM GOALS: Target date: 11/14/23  Patient will be independent with advanced/ongoing HEP to improve outcomes and carryover.  Baseline:  Goal status: MET 10/20/23 btwn HEP and ex class  2.  Patient will report 50-75% improvement in low back pain to improve QOL.  Baseline: 5/10 Goal status: 10/03/23 progressing  10/20/23 40% progressing and 10/24/23 10/31/23 progressing  3.  Patient will demonstrate full pain free lumbar ROM to perform ADLs.   Baseline: see chart Goal status: 10/03/23 progressing  and 10/20/23  and 10/24/23. 9/9 WFL except ext limited 50%  4.  Patient will be up and down a set of stairs without pain.  Baseline: pain going up and down Goal status: ongoing 10/03/23  progressing 10/20/23  and 10/24/23  and 11/10/23  5.  Patient will be able to sleep in her bed again for at least 3-4 hrs.  Baseline: 1 hr tolerance  Goal status: 10/03/23 progressing  and 10/20/23. 10/31/23 progressing slowly- hard to get comfortable  progressing 11/10/23  6.  Patient will tolerate 30 min of standing to perform ADLs and household chores Baseline: ~10 mins Goal status: 10/03/23 progressing 10/20/23 progressing 15-20  . MET 10/24/23 pt stated stated did 30-45 min   PLAN:  PT FREQUENCY: 2x/week  PT DURATION: 10 weeks  PLANNED INTERVENTIONS: 97110-Therapeutic exercises, 97530- Therapeutic activity, [id number]- Neuromuscular re-education, 97535- Self Care, [id number]- Manual therapy, [id number]- Gait training, [phone]-  Electrical stimulation (unattended), [id number]- Vasopneumatic device, [id number]- Traction (mechanical), [id number] (1-2 muscles), [id number] (3+ muscles)- Dry Needling,  Patient/Family education, Balance training, Stair training, Taping, Joint mobilization, Spinal mobilization, Cryotherapy, and Moist heat.  PLAN FOR NEXT SESSION: renewal next session . Rec 1-2 x a week Do a BERG and add goal. Steps x2 without rail   Jon Brayla Pat PTA 11/10/23 9:14 AM

## 2023-11-14 ENCOUNTER — Ambulatory Visit: Admitting: Physical Therapy

## 2023-11-14 DIAGNOSIS — M6281 Muscle weakness (generalized): Secondary | ICD-10-CM | POA: Diagnosis not present

## 2023-11-14 DIAGNOSIS — M5417 Radiculopathy, lumbosacral region: Secondary | ICD-10-CM

## 2023-11-14 DIAGNOSIS — M25551 Pain in right hip: Secondary | ICD-10-CM

## 2023-11-14 DIAGNOSIS — M25552 Pain in left hip: Secondary | ICD-10-CM | POA: Diagnosis not present

## 2023-11-14 NOTE — Therapy (Signed)
 OUTPATIENT PHYSICAL THERAPY THORACOLUMBAR TREATMENT     Patient Name: Margaret Reese MRN: [id number] DOB:24-Jul-1945, 78 y.o., female Today's Date: 11/14/2023  END OF SESSION:  PT End of Session - 11/14/23 0907     Visit Number 16    Authorization Type Medicare    PT Start Time 0910    PT Stop Time 0955    PT Time Calculation (min) 45 min            Past Medical History:  Diagnosis Date   Allergy    Anxiety    Arthritis    Asthma    Complication of anesthesia    pt states has difficulty awakening; also has increased sinus drainage   Constipation    Edema, lower extremity    Encounter for interrogation of cardiac pacemaker 09/05/2018   Falls    GERD (gastroesophageal reflux disease)    H/O back injury    History of bronchitis    History of colon polyps    Hypertension    Imbalance    Kidney cysts    pt states not sure which kidney does see kidney specialist yearly pt states every thing okay currently    Legally blind in right eye, as defined in [place]     Mobitz type II atrioventricular block 11/08/2016   Multiple gastric ulcers    Obesity    OSA (obstructive sleep apnea) 05/03/2022   Pacemaker S/P [employer] Jude [employer]urity MRI model [id number] 05/20/2016   Renal stone    Retinal vein occlusion    Shortness of breath    Sinus node dysfunction (HCC) 12/16/2018   Tingling    left arm    Trace cataracts    Urinary frequency    Vitamin D  deficiency    Past Surgical History:  Procedure Laterality Date   CARDIOVASCULAR STRESS TEST  dec 2016   CHOLECYSTECTOMY     COLONOSCOPY  2011   EYE SURGERY Bilateral    Cataract surgery.    LEFT HEART CATH AND CORONARY ANGIOGRAPHY N/A 05/19/2016   Procedure: Left Heart Cath and Coronary Angiography;  Surgeon: Salena Negri, MD;  Location: [facility];  Service: Cardiovascular;  Laterality: N/A;   PACEMAKER IMPLANT N/A 05/20/2016   Procedure: Pacemaker Implant;  Surgeon: Will Gladis Norton, MD;  Location: [facility];   Service: Cardiovascular;  Laterality: N/A;   TONSILLECTOMY     TOTAL HIP ARTHROPLASTY Right 01/30/2015   Procedure: RIGHT TOTAL HIP ARTHROPLASTY ANTERIOR APPROACH AND REMOVAL LIPOMA RIGHT HIP;  Surgeon: Lonni CINDERELLA Poli, MD;  Location: [facility];  Service: Orthopedics;  Laterality: Right;   UPPER GI ENDOSCOPY     Patient Active Problem List   Diagnosis Date Noted   Back pain 12/26/2022   Fluid retention in legs 11/13/2022   Stress 06/14/2022   Obesity (HCC)- Start BMI 40.06 05/12/2022   OSA (obstructive sleep apnea) 05/03/2022   Hyperglycemia 03/15/2022   Heart block AV complete (HCC) 01/05/2022   Pulmonary hypertension (HCC) 11/19/2021   Chronic diastolic (congestive) heart failure (HCC) 06/01/2020   Pacemaker S/P St Jude Medical Assurity MRI model [id number] 05/20/2016   History of total right hip arthroplasty 01/30/2015   Arthritis 09/11/2014   Renal cyst 09/04/2014   Allergic rhinitis due to pollen 09/04/2014   Essential hypertension 07/08/2010   GERD (gastroesophageal reflux disease) 07/08/2010   Calcium oxalate renal stones 07/08/2010   Hyperlipidemia 07/08/2010    PCP: Norleen Jobs  REFERRING PROVIDER: Lonni Poli  REFERRING DIAG:  S03.358 (ICD-10-CM) - History of total right hip arthroplasty  M54.41,G89.29 (ICD-10-CM) - Chronic right-sided low back pain with right-sided sciatica    Rationale for Evaluation and Treatment: Rehabilitation  THERAPY DIAG:  Radiculopathy, lumbosacral region  Bilateral hip pain  Muscle weakness (generalized)  ONSET DATE: 08/16/23 referral  SUBJECTIVE:                                                                                                                                                                                           SUBJECTIVE STATEMENT:    Doing better going up steps without railing but can't go down   PERTINENT HISTORY:  Patient is a 78 year old female who is almost 9 years out from a right total hip  arthroplasty.  She comes in today ambulating with a cane.  She has been having a hard time getting up from a seated position and up from the floor when she is down on the floor.  She reports sciatic pain on the right side and also some left lower extremity and right lower extremity radicular types of symptoms.  She is not a diabetic.  She takes anti-inflammatories over-the-counter on a rare basis.  She denies any change in bowel or bladder function.   On exam both hips move smoothly and fluidly with no blocks or rotation.  Most of her pain seems to be with positive straight leg raise on both sides more so on the right than the left but she does have left lower extremity radicular symptoms as well.  PAIN:  Are you having pain? Yes: NPRS scale: 4/10 Pain location: both hips, into legs, some pain in low back Pain description: becoming more frequent and intense, electrical shock, achy  Aggravating factors: bending, standing too long Relieving factors: nothing really  PRECAUTIONS: None  RED FLAGS: None   WEIGHT BEARING RESTRICTIONS: No  FALLS:  Has patient fallen in last 6 months? No  LIVING ENVIRONMENT: Lives with: lives with their spouse Lives in: House/apartment Stairs: Yes: External: 5 steps; bilateral but cannot reach both Has following equipment at home: Single point cane and Walker - 2 wheeled  PLOF: Independent and Independent with basic ADLs  PATIENT GOALS: to not hurt and I don't want to risk falling  NEXT MD VISIT: 09/20/23  OBJECTIVE:  Note: Objective measures were completed at Evaluation unless otherwise noted.  DIAGNOSTIC FINDINGS:  2 views of the lumbar spine show a significant grade 1 spondylolisthesis at L4-L5 with some degenerative changes as well. An AP pelvis and lateral of the right hip shows a well-seated right total hip arthroplasty. There is moderate arthritis of the left hip.  COGNITION: Overall cognitive status: Within functional limits for tasks  assessed     SENSATION: WFL   POSTURE: rounded shoulders and forward head  PALPATION: Some tenderness in L2-L5  LUMBAR ROM:   AROM eval 09/28/23 10/03/23 10/20/23  Flexion Can touch toes, painful coming back up [facility] WFLS  Extension 25% with pain 25# with pain Limited 75% Limited 25%  Right lateral flexion Mid thigh with pain Limited 25% Limited 25% Limited 25%  Left lateral flexion Fibular head no pain Limited 25% Limited 25% WFLs  Right rotation 25% [facility] WFL   Left rotation 50% WFL WFL    (Blank rows = not tested)  LOWER EXTREMITY ROM:   grossly within functional ranges   LOWER EXTREMITY MMT:  grossly 5/5, some pain with resisted knee extension on RLE, hip extension 4/5  LUMBAR SPECIAL TESTS:  Straight leg raise test: Positive and FABER test: Positive  FUNCTIONAL TESTS:  5 times sit to stand: 10.39s Timed up and go (TUG): 15.53s w/o cane  GAIT: Distance walked: in clinic distances Assistive device utilized: Single point cane Level of assistance: Complete Independence  TREATMENT DATE: 11/14/23 BERG 47/56 Nustep L 5 6 inch lateral step across min a with cuing to keep feet fwd 10 x Red tband seated IR/ER 15 x each Red tband side stepping on foam beam no hands CGA 3 x Tandem on beam no hands 2 x CGA Leg Press 30# 3 sets 10 feet 3 ways for hip Navigating various height steps no AD CGA  11/10/23 Nustep L 5 VMO squeeze with LAQ 2 sets 10 Standing on airex vextor taps CGA HHA on airex stepping laterally over rolls Double HHA to single HHA - CG-min A Obstacle course CGA with 1 LOB requiring min A to prevent fall Agility ladder- CGA 6 inch step up 5 x each leg without UE Up and down 2 steps 1 step at a time no hands cg- min A  11/07/23 5# cable pulley shld ext 2 sets 10 10# cable row 2 sets 10 10# cable pulleys hip 4 way 10x BIL Nustep L 5 Airex to 6 inch box step ups 5 x each leg then alternate 20 x- CGA and cuing HS curl 25# 2 sets 12 Knee ext  10 # 2 sets 12 Leg Press 30# 10x feet 3 way for hips STS with wt ball chest press 10 x then OH 10 x   11/03/23 Nustep L 5 STS with wt ball chest press 10 x Standing wt ball trunk rotation 10 x each side 1 lap with farmers carry each hand Obstacle course Side stepping laterally in /out 6 in box- CGA Side stepping foam bean 6 x HHA 5# SL alt SL hip flex,ext and abd Sitting 5# lateral step up onto 6 in 20 x each 5# marching on airex 20 x 2 sets  10/31/23 Amb 250 feet outside without AD without painn but SOB and multi standing rest breaks needed STS with wt ball chest press 10 x Standing wt ball trunk rotation 10 x each side Leg press 20# 3 sets 10 Resisted trunk flex and ext 20 x Seated row 20 # 2 sets 10 Resisted gait side stepping 30# 4 x each way ADD green tband side stepping 10 feet 5 x each Step up fwd 6 in opp leg ext 10 x then side step up with opp leg ABD HS curl 25# 2 sets 10 Knee ext 10 # 2 sets 10 5# lateral  flexion standing 15 x each    10/26/23 Nustep L 5 Ball toss on airex- CGA Walked 200 feet with ball toss - no LOB 5# lateral flexion 15 x each Resisted trunk rotation 15 x each side- green tband STS with 5# chest press 10 x  Resisted trunk flex and ext seated 2 sets 10- blue tband Resisted gait side stepping 30# 4 x each way ADD ball squeeze 15 x Green tband clams and hip flex 2 sets 10 3# 6 in step taps 20 x 2 sets CGA 3# lateral step tap 12 each leg PROM, stretch and STW ( left IT and RT hip flexor) LE and trunk  10/24/23 5# farmers carry 1 lap each hand. Pain with wt in left hand 5# lateral flexion 15 x each side STS with 5# chest press 10 Nustep L 3 Step up 6 inch 5 x each leg, light hand support then 5 x more from airex Standing on airex ball toss- no LOB Lateral stepping over foam roll on iarex 10 x min a with LOB-setpping left harder Lateral steps 5 x each way with rail 4 in and 6 in 3# stepping over and into 6 inch box 5 x fwd and  latrally 3# 6 in step taps 20 x 2 sets CG-minA     10/20/23 Assessed ROM and goals Nustep L 3 Step over step 2 rail 4 inch 4 x Step over step 2 rail 6 inch 3 x Step up 5 x each leg 6 inch 1 rail Step down 5 x each leg 6 inch 2 rail- RT leg weaker Leg press 20# 3 sets 10 20# resisted gait 5 x fwd and back, 3 x each side STS with yellow wt ball 10 x HS curl 20# 2 sets 10 PROM LE and trunk with STW to RT quad/hip flexor and Left ITB  10/13/22\5 Green tband seated - HS curl , hip flex and clams 2 sets 10 Seated 3 # LAQ 2 sets 10 BIl Seated 3# ER/IR 10 x BIL HHA 3# marching fwd and back 15 feet 2 x HHA 3# side stepping 15 feet 2x 3# ankle wt alt step tap 6 inch 20 x with SPC 2 sets 3# ankle step tap laterally 6 inch 10 x each STS on airex CGA 10 x Foam beam in // bars tandem fwd and back and side stepping Gentle STW RT thigh/hip flex and left ITB Gentle PROM LE and trunk- cuing to relax  10/05/23 PROM LE and trunk  STW RT hip flex and quad on a stretch STW to Left ITB on stretch Pelvic ROM and stab ex on dyna disc 4 in step up fwd with opp hip ext 10 x with UE 4 in step up with opp hip abd x 10 with UE Seated trunk flex and ext 2 sets 10 blue tband Educ in hip flex stretch off bed and adding rolling to RT thigh  10/03/23 PROM LE and trunk STW to distal ITB Feet on ball bridge,KTC and obl Green tband clams BIL 10 x then SL 10 x Green tband marching 10 x BIL  2 sets ADD ball squeeze 10 x then 10x with bridge SLR 10x BIL then SLR with abd 10 x 3# LAQ 20 x alt 3# hip flex 20 x alt 3# standing hip flex,ext and abd 10 x each  6in 3# step tap with SPC fwd and laterally 10 x each  09/28/23  Feet on ball bridge,KTC and obl- left  knee catch with KTC, cuing with rotation to not twist so much Isometric abdominals 3 sec hold PROM LE and trunk  Left ITB stretching and STW- very tender. Instructed in rolling at home and stretching SL hip 3 way 10 x BIL In // bars on foam roll  3 x  laterally ADD ball squeeze 15 x Clams 2 sets 10 STS with wt ball OH 10 x    09/25/23 Walking 2 laps with SPC   Supine LTR, HS stretch, SKTC Feet on pball rotations and knees to chest  Ball squeeze 2x10 Bridges 2x10 STS 2x10 Calf stretch  Standing rows and ext red band 2x10  09/21/23  Declined Nustep- last time my pacemaker was really revving up and I was very concerned and surprised, I don't need that stress today   Spent some time discussing and educating on prior imaging findings (spondylolisthesis, hip OA, etc) and how it is possibly affecting (or not affecting) current pain levels   Supine hip flexor stretch 2x30 seconds B Supine frog stretch 2x30 seconds Piriformis stretch 2x30 seconds B  HS/gastroc stretch 2x30 seconds B  Seated TA set 15x3 seconds Seated TA set + mini-march x12 Seated TA set + opposite UE/LE flexion x10B STS x10 no UEs cues for eccentric lower     09/14/23 Nustep L 5 LE only at 4 1/2 min decreased resistance to 3 Black tband trunk ext 2 sets 10 ADD ball squeeze 2 sets 10 Clams red tband 2 sets 10  Red tband hip flex 2 sets 10 Iso abdominals 2 set s 10 Wt ball chest press then obl 10 x each seated 3# alt LAQ 20 x 3 # hip abd with flexion 10 x BIL 2 sets  Standing 3# hip ext and abd 10 x Legs on ball bridge 2 sets 10 Legs on ball obl 20 x     09/05/23 EVAL                                                                                                                                PATIENT EDUCATION:  Education details: POC, HEP, sciatica, hip flexor tightness sleeping in recliners Person educated: Patient Education method: Medical illustrator Education comprehension: verbalized understanding and returned demonstration  HOME EXERCISE PROGRAM: Access Code: [email or web address] URL: [email or web address] Date: 10/03/2023 Prepared by: Kinzy Weyers  Exercises - Supine Bridge  - 1 x daily - 7 x weekly - 1 sets -  10 reps - 3 hold - Supine Hip Adduction Isometric with Ball  - 1 x daily - 7 x weekly - 2 sets - 10 reps - Supine March  - 1 x daily - 7 x weekly - 2 sets - 10 reps - Hooklying Clamshell with Resistance  - 1 x daily - 7 x weekly - 2 sets - 10 reps - Supine Straight Leg Raises  - 1 x daily - 7 x weekly - 3 sets - 10 reps -  Seated Long Arc Quad  - 1 x daily - 7 x weekly - 2 sets - 10 reps - Seated March  - 1 x daily - 7 x weekly - 2 sets - 10 reps - Seated Hip Flexion and External Rotation  - 1 x daily - 7 x weekly - 2 sets - 10 reps - Standing March with Counter Support  - 1 x daily - 7 x weekly - 1 sets - 10 reps - Standing Hip Abduction with Counter Support  - 1 x daily - 7 x weekly - 1 sets - 10 reps - Standing Hip Extension with Counter Support  - 1 x daily - 7 x weekly - 1 sets - 10 reps Access Code: [email or web address] URL: [email or web address] Date: 09/05/2023 Prepared by: Almetta Fam  Exercises - Supine Lower Trunk Rotation  - 1 x daily - 7 x weekly - 2 sets - 10 reps - Supine Bridge  - 1 x daily - 7 x weekly - 2 sets - 10 reps - Supine Figure 4 Piriformis Stretch  - 1 x daily - 7 x weekly - 2 reps - 15 hold - Modified Thomas Stretch  - 1 x daily - 7 x weekly - 2 reps - 15 hold  ASSESSMENT:  CLINICAL IMPRESSION:  pt overall pleased with progress. Still left knee catch but better. Pt would really like to continue and add more balance. BERG 47/56 . Pt will continue to benefit from skilled therapy to maximize func and balance. Left patella sits laterally which is what I believe might be causing catch.Will add BERG goal and walking for 10 min OBJECTIVE IMPAIRMENTS: Abnormal gait, decreased activity tolerance, decreased balance, decreased ROM, decreased strength, and pain.   ACTIVITY LIMITATIONS: bending, squatting, stairs, and locomotion level  PARTICIPATION LIMITATIONS: cleaning, laundry, driving, shopping, community activity, and yard work  PERSONAL FACTORS: Age, Past/current  experiences, Time since onset of injury/illness/exacerbation, and 1-2 comorbidities: arthritis, hx of surgery are also affecting patient's functional outcome.   REHAB POTENTIAL: Good  CLINICAL DECISION MAKING: Stable/uncomplicated  EVALUATION COMPLEXITY: Low  GOALS: Goals reviewed with patient? Yes  SHORT TERM GOALS: Target date: 10/10/23  Patient will be independent with initial HEP.  Baseline:  Goal status: 09/14/23 progressing  09/28/23 MET  2.  Patient will report centralization of radicular symptoms.  Baseline: pain in both legs Goal status: 09/28/23 no changes   10/03/23 progressing   MET 10/20/23   LONG TERM GOALS: Target date: 11/14/23  Patient will be independent with advanced/ongoing HEP to improve outcomes and carryover.  Baseline:  Goal status: MET 10/20/23 btwn HEP and ex class  2.  Patient will report 50-75% improvement in low back pain to improve QOL.  Baseline: 5/10 Goal status: 10/03/23 progressing  10/20/23 40% progressing and 10/24/23 10/31/23 progressing   11/14/23 progressing  3.  Patient will demonstrate full pain free lumbar ROM to perform ADLs.   Baseline: see chart Goal status: 10/03/23 progressing  and 10/20/23  and 10/24/23. 9/9 WFL except ext limited 50%  4.  Patient will be up and down a set of stairs without pain.  Baseline: pain going up and down Goal status: ongoing 10/03/23  progressing 10/20/23  and 10/24/23  and 11/10/23  and 11/14/23  5.  Patient will be able to sleep in her bed again for at least 3-4 hrs.  Baseline: 1 hr tolerance  Goal status: 10/03/23 progressing  and 10/20/23. 10/31/23 progressing slowly- hard to get comfortable  progressing 11/10/23  6.  Patient will tolerate 30 min of standing to perform ADLs and household chores Baseline: ~10 mins Goal status: 10/03/23 progressing 10/20/23 progressing 15-20  . MET 10/24/23 pt stated stated did 30-45 min  7.  Goal added 11/14/23 Will increase Berg to 52/56 for increased safety and function to reduce risk for  falls. Baseline:  11/14/23 47/56  8.  Increase Gait to be able to walk 10 minutes without assistive device. PLAN:  PT FREQUENCY: 2x/week  PT DURATION: 10 weeks  PLANNED INTERVENTIONS: 97110-Therapeutic exercises, 97530- Therapeutic activity, [id number]- Neuromuscular re-education, 97535- Self Care, [id number]- Manual therapy, [phone]- Gait training, [phone]- Electrical stimulation (unattended), [id number]- Vasopneumatic device, [id number]- Traction (mechanical), [phone] (1-2 muscles), 20561 (3+ muscles)- Dry Needling, Patient/Family education, Balance training, Stair training, Taping, Joint mobilization, Spinal mobilization, Cryotherapy, and Moist heat.  PLAN FOR NEXT SESSION: renewal . Pt is on vacation next week and wants to walk length of pier.   Jon Abdalrahman Clementson PTA 11/14/23 9:08 AM

## 2023-11-15 ENCOUNTER — Ambulatory Visit: Admitting: Orthopaedic Surgery

## 2023-11-17 DIAGNOSIS — Z23 Encounter for immunization: Secondary | ICD-10-CM | POA: Diagnosis not present

## 2023-11-18 ENCOUNTER — Other Ambulatory Visit (INDEPENDENT_AMBULATORY_CARE_PROVIDER_SITE_OTHER): Payer: Self-pay | Admitting: Physician Assistant

## 2023-11-18 DIAGNOSIS — F439 Reaction to severe stress, unspecified: Secondary | ICD-10-CM

## 2023-11-28 ENCOUNTER — Ambulatory Visit: Admitting: Physical Therapy

## 2023-12-01 ENCOUNTER — Ambulatory Visit: Attending: Family Medicine | Admitting: Physical Therapy

## 2023-12-01 DIAGNOSIS — M25552 Pain in left hip: Secondary | ICD-10-CM | POA: Diagnosis not present

## 2023-12-01 DIAGNOSIS — M25551 Pain in right hip: Secondary | ICD-10-CM | POA: Diagnosis not present

## 2023-12-01 DIAGNOSIS — M6281 Muscle weakness (generalized): Secondary | ICD-10-CM | POA: Diagnosis not present

## 2023-12-01 DIAGNOSIS — M5417 Radiculopathy, lumbosacral region: Secondary | ICD-10-CM | POA: Diagnosis not present

## 2023-12-01 NOTE — Therapy (Signed)
 OUTPATIENT PHYSICAL THERAPY THORACOLUMBAR TREATMENT     Patient Name: Margaret Reese MRN: [id number] DOB:[birth year], 78 y.o., female Today's Date: 12/01/2023  END OF SESSION:  PT End of Session - 12/01/23 0922     Visit Number 17    Date for Recertification  12/15/23    Authorization Type Medicare    PT Start Time 0925    PT Stop Time 1010    PT Time Calculation (min) 45 min            Past Medical History:  Diagnosis Date   Allergy    Anxiety    Arthritis    Asthma    Complication of anesthesia    pt states has difficulty awakening; also has increased sinus drainage   Constipation    Edema, lower extremity    Encounter for interrogation of cardiac pacemaker 09/05/2018   Falls    GERD (gastroesophageal reflux disease)    H/O back injury    History of bronchitis    History of colon polyps    Hypertension    Imbalance    Kidney cysts    pt states not sure which kidney does see kidney specialist yearly pt states every thing okay currently    Legally blind in right eye, as defined in [place]     Mobitz type II atrioventricular block 11/08/2016   Multiple gastric ulcers    Obesity    OSA (obstructive sleep apnea) 05/03/2022   Pacemaker S/P [employer] Jude [employer]urity MRI model [id number] 05/20/2016   Renal stone    Retinal vein occlusion    Shortness of breath    Sinus node dysfunction (HCC) 12/16/2018   Tingling    left arm    Trace cataracts    Urinary frequency    Vitamin D  deficiency    Past Surgical History:  Procedure Laterality Date   CARDIOVASCULAR STRESS TEST  dec 2016   CHOLECYSTECTOMY     COLONOSCOPY  2011   EYE SURGERY Bilateral    Cataract surgery.    LEFT HEART CATH AND CORONARY ANGIOGRAPHY N/A 05/19/2016   Procedure: Left Heart Cath and Coronary Angiography;  Surgeon: Salena Negri, MD;  Location: [facility];  Service: Cardiovascular;  Laterality: N/A;   PACEMAKER IMPLANT N/A 05/20/2016   Procedure: Pacemaker Implant;  Surgeon: Will Gladis Norton, MD;  Location: [facility];  Service: Cardiovascular;  Laterality: N/A;   TONSILLECTOMY     TOTAL HIP ARTHROPLASTY Right 01/30/2015   Procedure: RIGHT TOTAL HIP ARTHROPLASTY ANTERIOR APPROACH AND REMOVAL LIPOMA RIGHT HIP;  Surgeon: Lonni CINDERELLA Poli, MD;  Location: [facility];  Service: Orthopedics;  Laterality: Right;   UPPER GI ENDOSCOPY     Patient Active Problem List   Diagnosis Date Noted   Back pain 12/26/2022   Fluid retention in legs 11/13/2022   Stress 06/14/2022   Obesity (HCC)- Start BMI 40.06 05/12/2022   OSA (obstructive sleep apnea) 05/03/2022   Hyperglycemia 03/15/2022   Heart block AV complete (HCC) 01/05/2022   Pulmonary hypertension (HCC) 11/19/2021   Chronic diastolic (congestive) heart failure (HCC) 06/01/2020   Pacemaker S/P St Jude Medical Assurity MRI model [id number] 05/20/2016   History of total right hip arthroplasty 01/30/2015   Arthritis 09/11/2014   Renal cyst 09/04/2014   Allergic rhinitis due to pollen 09/04/2014   Essential hypertension 07/08/2010   GERD (gastroesophageal reflux disease) 07/08/2010   Calcium oxalate renal stones 07/08/2010   Hyperlipidemia 07/08/2010    PCP: Norleen Jobs  REFERRING PROVIDER: Lonni Poli  REFERRING DIAG:  [phone] (ICD-10-CM) - History of total right hip arthroplasty  M54.41,G89.29 (ICD-10-CM) - Chronic right-sided low back pain with right-sided sciatica    Rationale for Evaluation and Treatment: Rehabilitation  THERAPY DIAG:  Radiculopathy, lumbosacral region  Bilateral hip pain  Muscle weakness (generalized)  ONSET DATE: 08/16/23 referral  SUBJECTIVE:                                                                                                                                                                                           SUBJECTIVE STATEMENT:   at the beach at a lot of driving and kinda tense. Sore since and back on SPC. Knees just feel wobble   PERTINENT HISTORY:   Patient is a 78 year old female who is almost 9 years out from a right total hip arthroplasty.  She comes in today ambulating with a cane.  She has been having a hard time getting up from a seated position and up from the floor when she is down on the floor.  She reports sciatic pain on the right side and also some left lower extremity and right lower extremity radicular types of symptoms.  She is not a diabetic.  She takes anti-inflammatories over-the-counter on a rare basis.  She denies any change in bowel or bladder function.   On exam both hips move smoothly and fluidly with no blocks or rotation.  Most of her pain seems to be with positive straight leg raise on both sides more so on the right than the left but she does have left lower extremity radicular symptoms as well.  PAIN:  Are you having pain? Yes: NPRS scale: 4/10 Pain location: both hips, into legs, some pain in low back Pain description: becoming more frequent and intense, electrical shock, achy  Aggravating factors: bending, standing too long Relieving factors: nothing really  PRECAUTIONS: None  RED FLAGS: None   WEIGHT BEARING RESTRICTIONS: No  FALLS:  Has patient fallen in last 6 months? No  LIVING ENVIRONMENT: Lives with: lives with their spouse Lives in: House/apartment Stairs: Yes: External: 5 steps; bilateral but cannot reach both Has following equipment at home: Single point cane and Walker - 2 wheeled  PLOF: Independent and Independent with basic ADLs  PATIENT GOALS: to not hurt and I don't want to risk falling  NEXT MD VISIT: 09/20/23  OBJECTIVE:  Note: Objective measures were completed at Evaluation unless otherwise noted.  DIAGNOSTIC FINDINGS:  2 views of the lumbar spine show a significant grade 1 spondylolisthesis at L4-L5 with some degenerative changes as well. An AP pelvis and lateral of the right  hip shows a well-seated right total hip arthroplasty. There is moderate arthritis of the left hip.     COGNITION: Overall cognitive status: Within functional limits for tasks assessed     SENSATION: WFL   POSTURE: rounded shoulders and forward head  PALPATION: Some tenderness in L2-L5  LUMBAR ROM:   AROM eval 09/28/23 10/03/23 10/20/23  Flexion Can touch toes, painful coming back up [facility] WFLS  Extension 25% with pain 25# with pain Limited 75% Limited 25%  Right lateral flexion Mid thigh with pain Limited 25% Limited 25% Limited 25%  Left lateral flexion Fibular head no pain Limited 25% Limited 25% WFLs  Right rotation 25% [facility] WFL   Left rotation 50% WFL WFL    (Blank rows = not tested)  LOWER EXTREMITY ROM:   grossly within functional ranges   LOWER EXTREMITY MMT:  grossly 5/5, some pain with resisted knee extension on RLE, hip extension 4/5  LUMBAR SPECIAL TESTS:  Straight leg raise test: Positive and FABER test: Positive  FUNCTIONAL TESTS:  5 times sit to stand: 10.39s Timed up and go (TUG): 15.53s w/o cane  GAIT: Distance walked: in clinic distances Assistive device utilized: Single point cane Level of assistance: Complete Independence  TREATMENT DATE:  12/01/23 Nustep L 5 Tandem gait on foam beam 5 x minimal UE support Side stepping on foam mat 5 x each way min UE support More square L 2 stepping fwd and SW STS 10 x from standard chair 10 x BIL LE And trunk PROM with STW to left ITB     11/14/23 BERG 47/56 Nustep L 5 6 inch lateral step across min a with cuing to keep feet fwd 10 x Red tband seated IR/ER 15 x each Red tband side stepping on foam beam no hands CGA 3 x Tandem on beam no hands 2 x CGA Leg Press 30# 3 sets 10 feet 3 ways for hip Navigating various height steps no AD CGA  11/10/23 Nustep L 5 VMO squeeze with LAQ 2 sets 10 Standing on airex vextor taps CGA HHA on airex stepping laterally over rolls Double HHA to single HHA - CG-min A Obstacle course CGA with 1 LOB requiring min A to prevent fall Agility ladder-  CGA 6 inch step up 5 x each leg without UE Up and down 2 steps 1 step at a time no hands cg- min A  11/07/23 5# cable pulley shld ext 2 sets 10 10# cable row 2 sets 10 10# cable pulleys hip 4 way 10x BIL Nustep L 5 Airex to 6 inch box step ups 5 x each leg then alternate 20 x- CGA and cuing HS curl 25# 2 sets 12 Knee ext 10 # 2 sets 12 Leg Press 30# 10x feet 3 way for hips STS with wt ball chest press 10 x then OH 10 x   11/03/23 Nustep L 5 STS with wt ball chest press 10 x Standing wt ball trunk rotation 10 x each side 1 lap with farmers carry each hand Obstacle course Side stepping laterally in /out 6 in box- CGA Side stepping foam bean 6 x HHA 5# SL alt SL hip flex,ext and abd Sitting 5# lateral step up onto 6 in 20 x each 5# marching on airex 20 x 2 sets  10/31/23 Amb 250 feet outside without AD without painn but SOB and multi standing rest breaks needed STS with wt ball chest press 10 x Standing wt ball trunk  rotation 10 x each side Leg press 20# 3 sets 10 Resisted trunk flex and ext 20 x Seated row 20 # 2 sets 10 Resisted gait side stepping 30# 4 x each way ADD green tband side stepping 10 feet 5 x each Step up fwd 6 in opp leg ext 10 x then side step up with opp leg ABD HS curl 25# 2 sets 10 Knee ext 10 # 2 sets 10 5# lateral flexion standing 15 x each    10/26/23 Nustep L 5 Ball toss on airex- CGA Walked 200 feet with ball toss - no LOB 5# lateral flexion 15 x each Resisted trunk rotation 15 x each side- green tband STS with 5# chest press 10 x  Resisted trunk flex and ext seated 2 sets 10- blue tband Resisted gait side stepping 30# 4 x each way ADD ball squeeze 15 x Green tband clams and hip flex 2 sets 10 3# 6 in step taps 20 x 2 sets CGA 3# lateral step tap 12 each leg PROM, stretch and STW ( left IT and RT hip flexor) LE and trunk  10/24/23 5# farmers carry 1 lap each hand. Pain with wt in left hand 5# lateral flexion 15 x each side STS  with 5# chest press 10 Nustep L 3 Step up 6 inch 5 x each leg, light hand support then 5 x more from airex Standing on airex ball toss- no LOB Lateral stepping over foam roll on iarex 10 x min a with LOB-setpping left harder Lateral steps 5 x each way with rail 4 in and 6 in 3# stepping over and into 6 inch box 5 x fwd and latrally 3# 6 in step taps 20 x 2 sets CG-minA     10/20/23 Assessed ROM and goals Nustep L 3 Step over step 2 rail 4 inch 4 x Step over step 2 rail 6 inch 3 x Step up 5 x each leg 6 inch 1 rail Step down 5 x each leg 6 inch 2 rail- RT leg weaker Leg press 20# 3 sets 10 20# resisted gait 5 x fwd and back, 3 x each side STS with yellow wt ball 10 x HS curl 20# 2 sets 10 PROM LE and trunk with STW to RT quad/hip flexor and Left ITB  10/13/22\5 Green tband seated - HS curl , hip flex and clams 2 sets 10 Seated 3 # LAQ 2 sets 10 BIl Seated 3# ER/IR 10 x BIL HHA 3# marching fwd and back 15 feet 2 x HHA 3# side stepping 15 feet 2x 3# ankle wt alt step tap 6 inch 20 x with SPC 2 sets 3# ankle step tap laterally 6 inch 10 x each STS on airex CGA 10 x Foam beam in // bars tandem fwd and back and side stepping Gentle STW RT thigh/hip flex and left ITB Gentle PROM LE and trunk- cuing to relax  10/05/23 PROM LE and trunk  STW RT hip flex and quad on a stretch STW to Left ITB on stretch Pelvic ROM and stab ex on dyna disc 4 in step up fwd with opp hip ext 10 x with UE 4 in step up with opp hip abd x 10 with UE Seated trunk flex and ext 2 sets 10 blue tband Educ in hip flex stretch off bed and adding rolling to RT thigh  10/03/23 PROM LE and trunk STW to distal ITB Feet on ball bridge,KTC and obl Green tband clams  BIL 10 x then SL 10 x Green tband marching 10 x BIL  2 sets ADD ball squeeze 10 x then 10x with bridge SLR 10x BIL then SLR with abd 10 x 3# LAQ 20 x alt 3# hip flex 20 x alt 3# standing hip flex,ext and abd 10 x each  6in 3# step tap  with SPC fwd and laterally 10 x each  09/28/23  Feet on ball bridge,KTC and obl- left knee catch with KTC, cuing with rotation to not twist so much Isometric abdominals 3 sec hold PROM LE and trunk  Left ITB stretching and STW- very tender. Instructed in rolling at home and stretching SL hip 3 way 10 x BIL In // bars on foam roll 3 x  laterally ADD ball squeeze 15 x Clams 2 sets 10 STS with wt ball OH 10 x    09/25/23 Walking 2 laps with SPC   Supine LTR, HS stretch, SKTC Feet on pball rotations and knees to chest  Ball squeeze 2x10 Bridges 2x10 STS 2x10 Calf stretch  Standing rows and ext red band 2x10  09/21/23  Declined Nustep- last time my pacemaker was really revving up and I was very concerned and surprised, I don't need that stress today   Spent some time discussing and educating on prior imaging findings (spondylolisthesis, hip OA, etc) and how it is possibly affecting (or not affecting) current pain levels   Supine hip flexor stretch 2x30 seconds B Supine frog stretch 2x30 seconds Piriformis stretch 2x30 seconds B  HS/gastroc stretch 2x30 seconds B  Seated TA set 15x3 seconds Seated TA set + mini-march x12 Seated TA set + opposite UE/LE flexion x10B STS x10 no UEs cues for eccentric lower     09/14/23 Nustep L 5 LE only at 4 1/2 min decreased resistance to 3 Black tband trunk ext 2 sets 10 ADD ball squeeze 2 sets 10 Clams red tband 2 sets 10  Red tband hip flex 2 sets 10 Iso abdominals 2 set s 10 Wt ball chest press then obl 10 x each seated 3# alt LAQ 20 x 3 # hip abd with flexion 10 x BIL 2 sets  Standing 3# hip ext and abd 10 x Legs on ball bridge 2 sets 10 Legs on ball obl 20 x     09/05/23 EVAL                                                                                                                                PATIENT EDUCATION:  Education details: POC, HEP, sciatica, hip flexor tightness sleeping in recliners Person educated:  Patient Education method: Medical illustrator Education comprehension: verbalized understanding and returned demonstration  HOME EXERCISE PROGRAM: Access Code: [email or web address] URL: [email or web address] Date: 10/03/2023 Prepared by: Murel Shenberger  Exercises - Supine Bridge  - 1 x daily - 7 x weekly - 1 sets - 10 reps - 3 hold - Supine Hip  Adduction Isometric with Ball  - 1 x daily - 7 x weekly - 2 sets - 10 reps - Supine March  - 1 x daily - 7 x weekly - 2 sets - 10 reps - Hooklying Clamshell with Resistance  - 1 x daily - 7 x weekly - 2 sets - 10 reps - Supine Straight Leg Raises  - 1 x daily - 7 x weekly - 3 sets - 10 reps - Seated Long Arc Quad  - 1 x daily - 7 x weekly - 2 sets - 10 reps - Seated March  - 1 x daily - 7 x weekly - 2 sets - 10 reps - Seated Hip Flexion and External Rotation  - 1 x daily - 7 x weekly - 2 sets - 10 reps - Standing March with Counter Support  - 1 x daily - 7 x weekly - 1 sets - 10 reps - Standing Hip Abduction with Counter Support  - 1 x daily - 7 x weekly - 1 sets - 10 reps - Standing Hip Extension with Counter Support  - 1 x daily - 7 x weekly - 1 sets - 10 reps Access Code: [email or web address] URL: [email or web address] Date: 09/05/2023 Prepared by: Almetta Fam  Exercises - Supine Lower Trunk Rotation  - 1 x daily - 7 x weekly - 2 sets - 10 reps - Supine Bridge  - 1 x daily - 7 x weekly - 2 sets - 10 reps - Supine Figure 4 Piriformis Stretch  - 1 x daily - 7 x weekly - 2 reps - 15 hold - Modified Thomas Stretch  - 1 x daily - 7 x weekly - 2 reps - 15 hold  ASSESSMENT:  CLINICAL IMPRESSION:   Goals assessed and documents. Pt arrives back after vacation with increased soreness and stiffness and amb with SPC. C/O left knee feeling like it wanted to lock/pop. Left knee issues with stepping ex today with PROM and STW LEft IT tight again and limied tolerance and ability to relax d/t pain. OBJECTIVE IMPAIRMENTS: Abnormal gait,  decreased activity tolerance, decreased balance, decreased ROM, decreased strength, and pain.   ACTIVITY LIMITATIONS: bending, squatting, stairs, and locomotion level  PARTICIPATION LIMITATIONS: cleaning, laundry, driving, shopping, community activity, and yard work  PERSONAL FACTORS: Age, Past/current experiences, Time since onset of injury/illness/exacerbation, and 1-2 comorbidities: arthritis, hx of surgery are also affecting patient's functional outcome.   REHAB POTENTIAL: Good  CLINICAL DECISION MAKING: Stable/uncomplicated  EVALUATION COMPLEXITY: Low  GOALS: Goals reviewed with patient? Yes  SHORT TERM GOALS: Target date: 10/10/23  Patient will be independent with initial HEP.  Baseline:  Goal status: 09/14/23 progressing  09/28/23 MET  2.  Patient will report centralization of radicular symptoms.  Baseline: pain in both legs Goal status: 09/28/23 no changes   10/03/23 progressing   MET 10/20/23   LONG TERM GOALS: Target date: 11/14/23  Patient will be independent with advanced/ongoing HEP to improve outcomes and carryover.  Baseline:  Goal status: MET 10/20/23 btwn HEP and ex class  2.  Patient will report 50-75% improvement in low back pain to improve QOL.  Baseline: 5/10 Goal status: 10/03/23 progressing  10/20/23 40% progressing and 10/24/23 10/31/23 progressing   11/14/23 progressing  3.  Patient will demonstrate full pain free lumbar ROM to perform ADLs.   Baseline: see chart Goal status: 10/03/23 progressing  and 10/20/23  and 10/24/23. 9/9 WFL except ext limited 50%  12/01/23 WFLs ext limited 75%  4.  Patient  will be up and down a set of stairs without pain.  Baseline: pain going up and down Goal status: ongoing 10/03/23  progressing 10/20/23  and 10/24/23  and 11/10/23  and 11/14/23  5.  Patient will be able to sleep in her bed again for at least 3-4 hrs.  Baseline: 1 hr tolerance  Goal status: 10/03/23 progressing  and 10/20/23. 10/31/23 progressing slowly- hard to get comfortable   progressing 11/10/23  12/01/23 on going  6.  Patient will tolerate 30 min of standing to perform ADLs and household chores Baseline: ~10 mins Goal status: 10/03/23 progressing 10/20/23 progressing 15-20  . MET 10/24/23 pt stated stated did 30-45 min  7.  Goal added 11/14/23 Will increase Berg to 52/56 for increased safety and function to reduce risk for falls. Baseline:  11/14/23 47/56  12/01/23 progressing  8.  Increase Gait to be able to walk 10 minutes without assistive device.  12/01/23 progressing PLAN:  PT FREQUENCY: 2x/week  PT DURATION: 10 weeks  PLANNED INTERVENTIONS: 97110-Therapeutic exercises, 97530- Therapeutic activity, 97112- Neuromuscular re-education, 97535- Self Care, [id number]- Manual therapy, [phone]- Gait training, [phone]- Electrical stimulation (unattended), [id number]- Vasopneumatic device, [id number]- Traction (mechanical), [phone] (1-2 muscles), [id number] (3+ muscles)- Dry Needling, Patient/Family education, Balance training, Stair training, Taping, Joint mobilization, Spinal mobilization, Cryotherapy, and Moist heat.  PLAN FOR NEXT SESSION: check BERG. Progress gait.   Jon Shanee Batch PTA 12/01/23 9:23 AM

## 2023-12-05 ENCOUNTER — Ambulatory Visit (INDEPENDENT_AMBULATORY_CARE_PROVIDER_SITE_OTHER): Admitting: Physician Assistant

## 2023-12-08 ENCOUNTER — Ambulatory Visit: Admitting: Physical Therapy

## 2023-12-08 DIAGNOSIS — M5417 Radiculopathy, lumbosacral region: Secondary | ICD-10-CM | POA: Diagnosis not present

## 2023-12-08 DIAGNOSIS — M25551 Pain in right hip: Secondary | ICD-10-CM

## 2023-12-08 DIAGNOSIS — M25552 Pain in left hip: Secondary | ICD-10-CM | POA: Diagnosis not present

## 2023-12-08 DIAGNOSIS — M6281 Muscle weakness (generalized): Secondary | ICD-10-CM | POA: Diagnosis not present

## 2023-12-08 NOTE — Therapy (Signed)
 OUTPATIENT PHYSICAL THERAPY THORACOLUMBAR TREATMENT     Patient Name: Margaret Reese MRN: [id number] DOB:1945/04/02, 78 y.o., female Today's Date: 12/08/2023  END OF SESSION:  PT End of Session - 12/08/23 1006     Visit Number 18    Date for Recertification  12/15/23    Authorization Type Medicare    PT Start Time 1010    PT Stop Time 1055    PT Time Calculation (min) 45 min            Past Medical History:  Diagnosis Date   Allergy    Anxiety    Arthritis    Asthma    Complication of anesthesia    pt states has difficulty awakening; also has increased sinus drainage   Constipation    Edema, lower extremity    Encounter for interrogation of cardiac pacemaker 09/05/2018   Falls    GERD (gastroesophageal reflux disease)    H/O back injury    History of bronchitis    History of colon polyps    Hypertension    Imbalance    Kidney cysts    pt states not sure which kidney does see kidney specialist yearly pt states every thing okay currently    Legally blind in right eye, as defined in [place]     Mobitz type II atrioventricular block 11/08/2016   Multiple gastric ulcers    Obesity    OSA (obstructive sleep apnea) 05/03/2022   Pacemaker S/P [employer] Jude [employer]urity MRI model [id number] 05/20/2016   Renal stone    Retinal vein occlusion    Shortness of breath    Sinus node dysfunction (HCC) 12/16/2018   Tingling    left arm    Trace cataracts    Urinary frequency    Vitamin D  deficiency    Past Surgical History:  Procedure Laterality Date   CARDIOVASCULAR STRESS TEST  dec 2016   CHOLECYSTECTOMY     COLONOSCOPY  2011   EYE SURGERY Bilateral    Cataract surgery.    LEFT HEART CATH AND CORONARY ANGIOGRAPHY N/A 05/19/2016   Procedure: Left Heart Cath and Coronary Angiography;  Surgeon: Salena Negri, MD;  Location: [facility];  Service: Cardiovascular;  Laterality: N/A;   PACEMAKER IMPLANT N/A 05/20/2016   Procedure: Pacemaker Implant;  Surgeon: Will Gladis Norton, MD;  Location: [facility];  Service: Cardiovascular;  Laterality: N/A;   TONSILLECTOMY     TOTAL HIP ARTHROPLASTY Right 01/30/2015   Procedure: RIGHT TOTAL HIP ARTHROPLASTY ANTERIOR APPROACH AND REMOVAL LIPOMA RIGHT HIP;  Surgeon: Lonni CINDERELLA Poli, MD;  Location: [facility];  Service: Orthopedics;  Laterality: Right;   UPPER GI ENDOSCOPY     Patient Active Problem List   Diagnosis Date Noted   Back pain 12/26/2022   Fluid retention in legs 11/13/2022   Stress 06/14/2022   Obesity (HCC)- Start BMI 40.06 05/12/2022   OSA (obstructive sleep apnea) 05/03/2022   Hyperglycemia 03/15/2022   Heart block AV complete (HCC) 01/05/2022   Pulmonary hypertension (HCC) 11/19/2021   Chronic diastolic (congestive) heart failure (HCC) 06/01/2020   Pacemaker S/P St Jude Medical Assurity MRI model [id number] 05/20/2016   History of total right hip arthroplasty 01/30/2015   Arthritis 09/11/2014   Renal cyst 09/04/2014   Allergic rhinitis due to pollen 09/04/2014   Essential hypertension 07/08/2010   GERD (gastroesophageal reflux disease) 07/08/2010   Calcium oxalate renal stones 07/08/2010   Hyperlipidemia 07/08/2010    PCP: Norleen Jobs  REFERRING PROVIDER: Lonni Poli  REFERRING DIAG:  [phone] (ICD-10-CM) - History of total right hip arthroplasty  M54.41,G89.29 (ICD-10-CM) - Chronic right-sided low back pain with right-sided sciatica    Rationale for Evaluation and Treatment: Rehabilitation  THERAPY DIAG:  Radiculopathy, lumbosacral region  Bilateral hip pain  Muscle weakness (generalized)  ONSET DATE: 08/16/23 referral  SUBJECTIVE:                                                                                                                                                                                           SUBJECTIVE STATEMENT:   amb in with LLE stiff and minimal to no knee flexion d/t pain.   PERTINENT HISTORY:  Patient is a 78 year old female who is  almost 9 years out from a right total hip arthroplasty.  She comes in today ambulating with a cane.  She has been having a hard time getting up from a seated position and up from the floor when she is down on the floor.  She reports sciatic pain on the right side and also some left lower extremity and right lower extremity radicular types of symptoms.  She is not a diabetic.  She takes anti-inflammatories over-the-counter on a rare basis.  She denies any change in bowel or bladder function.   On exam both hips move smoothly and fluidly with no blocks or rotation.  Most of her pain seems to be with positive straight leg raise on both sides more so on the right than the left but she does have left lower extremity radicular symptoms as well.  PAIN:  Are you having pain? Yes: NPRS scale: 4/10 Pain location: both hips, into legs, some pain in low back Pain description: becoming more frequent and intense, electrical shock, achy  Aggravating factors: bending, standing too long Relieving factors: nothing really  PRECAUTIONS: None  RED FLAGS: None   WEIGHT BEARING RESTRICTIONS: No  FALLS:  Has patient fallen in last 6 months? No  LIVING ENVIRONMENT: Lives with: lives with their spouse Lives in: House/apartment Stairs: Yes: External: 5 steps; bilateral but cannot reach both Has following equipment at home: Single point cane and Walker - 2 wheeled  PLOF: Independent and Independent with basic ADLs  PATIENT GOALS: to not hurt and I don't want to risk falling  NEXT MD VISIT: 09/20/23  OBJECTIVE:  Note: Objective measures were completed at Evaluation unless otherwise noted.  DIAGNOSTIC FINDINGS:  2 views of the lumbar spine show a significant grade 1 spondylolisthesis at L4-L5 with some degenerative changes as well. An AP pelvis and lateral of the right hip shows a well-seated right total hip arthroplasty.  There is moderate arthritis of the left hip.    COGNITION: Overall cognitive  status: Within functional limits for tasks assessed     SENSATION: WFL   POSTURE: rounded shoulders and forward head  PALPATION: Some tenderness in L2-L5  LUMBAR ROM:   AROM eval 09/28/23 10/03/23 10/20/23  Flexion Can touch toes, painful coming back up [facility] WFLS  Extension 25% with pain 25# with pain Limited 75% Limited 25%  Right lateral flexion Mid thigh with pain Limited 25% Limited 25% Limited 25%  Left lateral flexion Fibular head no pain Limited 25% Limited 25% WFLs  Right rotation 25% [facility] WFL   Left rotation 50% WFL WFL    (Blank rows = not tested)  LOWER EXTREMITY ROM:   grossly within functional ranges   LOWER EXTREMITY MMT:  grossly 5/5, some pain with resisted knee extension on RLE, hip extension 4/5  LUMBAR SPECIAL TESTS:  Straight leg raise test: Positive and FABER test: Positive  FUNCTIONAL TESTS:  5 times sit to stand: 10.39s Timed up and go (TUG): 15.53s w/o cane  GAIT: Distance walked: in clinic distances Assistive device utilized: Single point cane Level of assistance: Complete Independence  TREATMENT DATE: 12/08/23 Nustep L 5 BERG 50/56 LAQ with ball squeeze 3# 2 sets 10 SAQ with ball squeeze 3# 2 sets 10 SLR with ER 3# 2 sets 10 3# side stepping 10 feet 3 x SL ADD 3# 10 x KT tape for laterally shifted left patella   12/01/23 Nustep L 5 Tandem gait on foam beam 5 x minimal UE support Side stepping on foam mat 5 x each way min UE support More square L 2 stepping fwd and SW STS 10 x from standard chair 10 x BIL LE And trunk PROM with STW to left ITB     11/14/23 BERG 47/56 Nustep L 5 6 inch lateral step across min a with cuing to keep feet fwd 10 x Red tband seated IR/ER 15 x each Red tband side stepping on foam beam no hands CGA 3 x Tandem on beam no hands 2 x CGA Leg Press 30# 3 sets 10 feet 3 ways for hip Navigating various height steps no AD CGA  11/10/23 Nustep L 5 VMO squeeze with LAQ 2 sets  10 Standing on airex vextor taps CGA HHA on airex stepping laterally over rolls Double HHA to single HHA - CG-min A Obstacle course CGA with 1 LOB requiring min A to prevent fall Agility ladder- CGA 6 inch step up 5 x each leg without UE Up and down 2 steps 1 step at a time no hands cg- min A  11/07/23 5# cable pulley shld ext 2 sets 10 10# cable row 2 sets 10 10# cable pulleys hip 4 way 10x BIL Nustep L 5 Airex to 6 inch box step ups 5 x each leg then alternate 20 x- CGA and cuing HS curl 25# 2 sets 12 Knee ext 10 # 2 sets 12 Leg Press 30# 10x feet 3 way for hips STS with wt ball chest press 10 x then OH 10 x   11/03/23 Nustep L 5 STS with wt ball chest press 10 x Standing wt ball trunk rotation 10 x each side 1 lap with farmers carry each hand Obstacle course Side stepping laterally in /out 6 in box- CGA Side stepping foam bean 6 x HHA 5# SL alt SL hip flex,ext and abd Sitting 5# lateral step up onto 6  in 20 x each 5# marching on airex 20 x 2 sets  10/31/23 Amb 250 feet outside without AD without painn but SOB and multi standing rest breaks needed STS with wt ball chest press 10 x Standing wt ball trunk rotation 10 x each side Leg press 20# 3 sets 10 Resisted trunk flex and ext 20 x Seated row 20 # 2 sets 10 Resisted gait side stepping 30# 4 x each way ADD green tband side stepping 10 feet 5 x each Step up fwd 6 in opp leg ext 10 x then side step up with opp leg ABD HS curl 25# 2 sets 10 Knee ext 10 # 2 sets 10 5# lateral flexion standing 15 x each    10/26/23 Nustep L 5 Ball toss on airex- CGA Walked 200 feet with ball toss - no LOB 5# lateral flexion 15 x each Resisted trunk rotation 15 x each side- green tband STS with 5# chest press 10 x  Resisted trunk flex and ext seated 2 sets 10- blue tband Resisted gait side stepping 30# 4 x each way ADD ball squeeze 15 x Green tband clams and hip flex 2 sets 10 3# 6 in step taps 20 x 2 sets CGA 3#  lateral step tap 12 each leg PROM, stretch and STW ( left IT and RT hip flexor) LE and trunk  10/24/23 5# farmers carry 1 lap each hand. Pain with wt in left hand 5# lateral flexion 15 x each side STS with 5# chest press 10 Nustep L 3 Step up 6 inch 5 x each leg, light hand support then 5 x more from airex Standing on airex ball toss- no LOB Lateral stepping over foam roll on iarex 10 x min a with LOB-setpping left harder Lateral steps 5 x each way with rail 4 in and 6 in 3# stepping over and into 6 inch box 5 x fwd and latrally 3# 6 in step taps 20 x 2 sets CG-minA     10/20/23 Assessed ROM and goals Nustep L 3 Step over step 2 rail 4 inch 4 x Step over step 2 rail 6 inch 3 x Step up 5 x each leg 6 inch 1 rail Step down 5 x each leg 6 inch 2 rail- RT leg weaker Leg press 20# 3 sets 10 20# resisted gait 5 x fwd and back, 3 x each side STS with yellow wt ball 10 x HS curl 20# 2 sets 10 PROM LE and trunk with STW to RT quad/hip flexor and Left ITB  10/13/22\5 Green tband seated - HS curl , hip flex and clams 2 sets 10 Seated 3 # LAQ 2 sets 10 BIl Seated 3# ER/IR 10 x BIL HHA 3# marching fwd and back 15 feet 2 x HHA 3# side stepping 15 feet 2x 3# ankle wt alt step tap 6 inch 20 x with SPC 2 sets 3# ankle step tap laterally 6 inch 10 x each STS on airex CGA 10 x Foam beam in // bars tandem fwd and back and side stepping Gentle STW RT thigh/hip flex and left ITB Gentle PROM LE and trunk- cuing to relax  10/05/23 PROM LE and trunk  STW RT hip flex and quad on a stretch STW to Left ITB on stretch Pelvic ROM and stab ex on dyna disc 4 in step up fwd with opp hip ext 10 x with UE 4 in step up with opp hip abd x 10 with UE  Seated trunk flex and ext 2 sets 10 blue tband Educ in hip flex stretch off bed and adding rolling to RT thigh  10/03/23 PROM LE and trunk STW to distal ITB Feet on ball bridge,KTC and obl Green tband clams BIL 10 x then SL 10 x Green tband  marching 10 x BIL  2 sets ADD ball squeeze 10 x then 10x with bridge SLR 10x BIL then SLR with abd 10 x 3# LAQ 20 x alt 3# hip flex 20 x alt 3# standing hip flex,ext and abd 10 x each  6in 3# step tap with SPC fwd and laterally 10 x each  09/28/23  Feet on ball bridge,KTC and obl- left knee catch with KTC, cuing with rotation to not twist so much Isometric abdominals 3 sec hold PROM LE and trunk  Left ITB stretching and STW- very tender. Instructed in rolling at home and stretching SL hip 3 way 10 x BIL In // bars on foam roll 3 x  laterally ADD ball squeeze 15 x Clams 2 sets 10 STS with wt ball OH 10 x    09/25/23 Walking 2 laps with SPC   Supine LTR, HS stretch, SKTC Feet on pball rotations and knees to chest  Ball squeeze 2x10 Bridges 2x10 STS 2x10 Calf stretch  Standing rows and ext red band 2x10  09/21/23  Declined Nustep- last time my pacemaker was really revving up and I was very concerned and surprised, I don't need that stress today   Spent some time discussing and educating on prior imaging findings (spondylolisthesis, hip OA, etc) and how it is possibly affecting (or not affecting) current pain levels   Supine hip flexor stretch 2x30 seconds B Supine frog stretch 2x30 seconds Piriformis stretch 2x30 seconds B  HS/gastroc stretch 2x30 seconds B  Seated TA set 15x3 seconds Seated TA set + mini-march x12 Seated TA set + opposite UE/LE flexion x10B STS x10 no UEs cues for eccentric lower     09/14/23 Nustep L 5 LE only at 4 1/2 min decreased resistance to 3 Black tband trunk ext 2 sets 10 ADD ball squeeze 2 sets 10 Clams red tband 2 sets 10  Red tband hip flex 2 sets 10 Iso abdominals 2 set s 10 Wt ball chest press then obl 10 x each seated 3# alt LAQ 20 x 3 # hip abd with flexion 10 x BIL 2 sets  Standing 3# hip ext and abd 10 x Legs on ball bridge 2 sets 10 Legs on ball obl 20 x     09/05/23 EVAL                                                                                                                                 PATIENT EDUCATION:  Education details: POC, HEP, sciatica, hip flexor tightness sleeping in recliners Person educated: Patient Education method: Medical illustrator Education comprehension: verbalized understanding and returned  demonstration  HOME EXERCISE PROGRAM:  Added to HEP. LAQ with ball squeeze, SL hip ADD and SLR with ER Access Code: [email or web address] URL: [email or web address] Date: 10/03/2023 Prepared by: Jonnette Nuon  Exercises - Supine Bridge  - 1 x daily - 7 x weekly - 1 sets - 10 reps - 3 hold - Supine Hip Adduction Isometric with Ball  - 1 x daily - 7 x weekly - 2 sets - 10 reps - Supine March  - 1 x daily - 7 x weekly - 2 sets - 10 reps - Hooklying Clamshell with Resistance  - 1 x daily - 7 x weekly - 2 sets - 10 reps - Supine Straight Leg Raises  - 1 x daily - 7 x weekly - 3 sets - 10 reps - Seated Long Arc Quad  - 1 x daily - 7 x weekly - 2 sets - 10 reps - Seated March  - 1 x daily - 7 x weekly - 2 sets - 10 reps - Seated Hip Flexion and External Rotation  - 1 x daily - 7 x weekly - 2 sets - 10 reps - Standing March with Counter Support  - 1 x daily - 7 x weekly - 1 sets - 10 reps - Standing Hip Abduction with Counter Support  - 1 x daily - 7 x weekly - 1 sets - 10 reps - Standing Hip Extension with Counter Support  - 1 x daily - 7 x weekly - 1 sets - 10 reps Access Code: [email or web address] URL: [email or web address] Date: 09/05/2023 Prepared by: Almetta Fam  Exercises - Supine Lower Trunk Rotation  - 1 x daily - 7 x weekly - 2 sets - 10 reps - Supine Bridge  - 1 x daily - 7 x weekly - 2 sets - 10 reps - Supine Figure 4 Piriformis Stretch  - 1 x daily - 7 x weekly - 2 reps - 15 hold - Modified Thomas Stretch  - 1 x daily - 7 x weekly - 2 reps - 15 hold  ASSESSMENT:  CLINICAL IMPRESSION:   Goals assessed and documents. Pt amb in with stiff Left knee, minimal  knee flexion. Left lateral shift so focused ex on ADD and used KT tape to help stab. Rec calling MD and showed pt some bracing options    OBJECTIVE IMPAIRMENTS: Abnormal gait, decreased activity tolerance, decreased balance, decreased ROM, decreased strength, and pain.     ACTIVITY LIMITATIONS: bending, squatting, stairs, and locomotion level  PARTICIPATION LIMITATIONS: cleaning, laundry, driving, shopping, community activity, and yard work  PERSONAL FACTORS: Age, Past/current experiences, Time since onset of injury/illness/exacerbation, and 1-2 comorbidities: arthritis, hx of surgery are also affecting patient's functional outcome.   REHAB POTENTIAL: Good  CLINICAL DECISION MAKING: Stable/uncomplicated  EVALUATION COMPLEXITY: Low  GOALS: Goals reviewed with patient? Yes  SHORT TERM GOALS: Target date: 10/10/23  Patient will be independent with initial HEP.  Baseline:  Goal status: 09/14/23 progressing  09/28/23 MET  2.  Patient will report centralization of radicular symptoms.  Baseline: pain in both legs Goal status: 09/28/23 no changes   10/03/23 progressing   MET 10/20/23   LONG TERM GOALS: Target date: 11/14/23  Patient will be independent with advanced/ongoing HEP to improve outcomes and carryover.  Baseline:  Goal status: MET 10/20/23 btwn HEP and ex class  2.  Patient will report 50-75% improvement in low back pain to improve QOL.  Baseline: 5/10 Goal status: 10/03/23 progressing  10/20/23 40% progressing and 10/24/23 10/31/23 progressing  11/14/23 progressing  3.  Patient will demonstrate full pain free lumbar ROM to perform ADLs.   Baseline: see chart Goal status: 10/03/23 progressing  and 10/20/23  and 10/24/23. 9/9 WFL except ext limited 50%  12/01/23 WFLs ext limited 75%  4.  Patient will be up and down a set of stairs without pain.  Baseline: pain going up and down Goal status: ongoing 10/03/23  progressing 10/20/23  and 10/24/23  and 11/10/23  and 11/14/23  5.  Patient will  be able to sleep in her bed again for at least 3-4 hrs.  Baseline: 1 hr tolerance  Goal status: 10/03/23 progressing  and 10/20/23. 10/31/23 progressing slowly- hard to get comfortable  progressing 11/10/23  12/01/23 on going  6.  Patient will tolerate 30 min of standing to perform ADLs and household chores Baseline: ~10 mins Goal status: 10/03/23 progressing 10/20/23 progressing 15-20  . MET 10/24/23 pt stated stated did 30-45 min  7.  Goal added 11/14/23 Will increase Berg to 52/56 for increased safety and function to reduce risk for falls. Baseline:  11/14/23 47/56  12/01/23 progressing  8.  Increase Gait to be able to walk 10 minutes without assistive device.  12/01/23 progressing PLAN:  PT FREQUENCY: 2x/week  PT DURATION: 10 weeks  PLANNED INTERVENTIONS: 97110-Therapeutic exercises, 97530- Therapeutic activity, 97112- Neuromuscular re-education, 97535- Self Care, [id number]- Manual therapy, [phone]- Gait training, [phone]- Electrical stimulation (unattended), 97016- Vasopneumatic device, [id number]- Traction (mechanical), [phone] (1-2 muscles), [phone] (3+ muscles)- Dry Needling, Patient/Family education, Balance training, Stair training, Taping, Joint mobilization, Spinal mobilization, Cryotherapy, and Moist heat.  PLAN FOR NEXT SESSION: renewal due next week. Rec calling MD to get appt for knee   Jon Danaria Larsen PTA 12/08/23 10:07 AM

## 2023-12-11 ENCOUNTER — Other Ambulatory Visit (INDEPENDENT_AMBULATORY_CARE_PROVIDER_SITE_OTHER): Payer: Self-pay | Admitting: Physician Assistant

## 2023-12-11 DIAGNOSIS — E559 Vitamin D deficiency, unspecified: Secondary | ICD-10-CM

## 2023-12-11 DIAGNOSIS — Z23 Encounter for immunization: Secondary | ICD-10-CM | POA: Diagnosis not present

## 2023-12-12 ENCOUNTER — Telehealth (INDEPENDENT_AMBULATORY_CARE_PROVIDER_SITE_OTHER): Admitting: Psychology

## 2023-12-14 ENCOUNTER — Ambulatory Visit: Admitting: Physical Therapy

## 2023-12-14 DIAGNOSIS — M5417 Radiculopathy, lumbosacral region: Secondary | ICD-10-CM

## 2023-12-14 DIAGNOSIS — M6281 Muscle weakness (generalized): Secondary | ICD-10-CM | POA: Diagnosis not present

## 2023-12-14 DIAGNOSIS — M25552 Pain in left hip: Secondary | ICD-10-CM | POA: Diagnosis not present

## 2023-12-14 DIAGNOSIS — M25551 Pain in right hip: Secondary | ICD-10-CM | POA: Diagnosis not present

## 2023-12-14 NOTE — Therapy (Signed)
 OUTPATIENT PHYSICAL THERAPY THORACOLUMBAR TREATMENT     Patient Name: Margaret Reese MRN: [id number] DOB:19-Aug-1945, 78 y.o., female Today's Date: 12/14/2023  END OF SESSION:  PT End of Session - 12/14/23 0936     Visit Number 19    Date for Recertification  12/15/23    Authorization Type Medicare    PT Start Time 0930    PT Stop Time 1015    PT Time Calculation (min) 45 min            Past Medical History:  Diagnosis Date   Allergy    Anxiety    Arthritis    Asthma    Complication of anesthesia    pt states has difficulty awakening; also has increased sinus drainage   Constipation    Edema, lower extremity    Encounter for interrogation of cardiac pacemaker 09/05/2018   Falls    GERD (gastroesophageal reflux disease)    H/O back injury    History of bronchitis    History of colon polyps    Hypertension    Imbalance    Kidney cysts    pt states not sure which kidney does see kidney specialist yearly pt states every thing okay currently    Legally blind in right eye, as defined in [place]     Mobitz type II atrioventricular block 11/08/2016   Multiple gastric ulcers    Obesity    OSA (obstructive sleep apnea) 05/03/2022   Pacemaker S/P [employer] Jude [employer]urity MRI model [id number] 05/20/2016   Renal stone    Retinal vein occlusion    Shortness of breath    Sinus node dysfunction (HCC) 12/16/2018   Tingling    left arm    Trace cataracts    Urinary frequency    Vitamin D  deficiency    Past Surgical History:  Procedure Laterality Date   CARDIOVASCULAR STRESS TEST  dec 2016   CHOLECYSTECTOMY     COLONOSCOPY  2011   EYE SURGERY Bilateral    Cataract surgery.    LEFT HEART CATH AND CORONARY ANGIOGRAPHY N/A 05/19/2016   Procedure: Left Heart Cath and Coronary Angiography;  Surgeon: Salena Negri, MD;  Location: [facility];  Service: Cardiovascular;  Laterality: N/A;   PACEMAKER IMPLANT N/A 05/20/2016   Procedure: Pacemaker Implant;  Surgeon: Will Gladis Norton, MD;  Location: [facility];  Service: Cardiovascular;  Laterality: N/A;   TONSILLECTOMY     TOTAL HIP ARTHROPLASTY Right 01/30/2015   Procedure: RIGHT TOTAL HIP ARTHROPLASTY ANTERIOR APPROACH AND REMOVAL LIPOMA RIGHT HIP;  Surgeon: Lonni CINDERELLA Poli, MD;  Location: [facility];  Service: Orthopedics;  Laterality: Right;   UPPER GI ENDOSCOPY     Patient Active Problem List   Diagnosis Date Noted   Back pain 12/26/2022   Fluid retention in legs 11/13/2022   Stress 06/14/2022   Obesity (HCC)- Start BMI 40.06 05/12/2022   OSA (obstructive sleep apnea) 05/03/2022   Hyperglycemia 03/15/2022   Heart block AV complete (HCC) 01/05/2022   Pulmonary hypertension (HCC) 11/19/2021   Chronic diastolic (congestive) heart failure (HCC) 06/01/2020   Pacemaker S/P St Jude Medical Assurity MRI model [id number] 05/20/2016   History of total right hip arthroplasty 01/30/2015   Arthritis 09/11/2014   Renal cyst 09/04/2014   Allergic rhinitis due to pollen 09/04/2014   Essential hypertension 07/08/2010   GERD (gastroesophageal reflux disease) 07/08/2010   Calcium oxalate renal stones 07/08/2010   Hyperlipidemia 07/08/2010    PCP: Norleen Jobs  REFERRING PROVIDER: Lonni Poli  REFERRING DIAG:  [phone] (ICD-10-CM) - History of total right hip arthroplasty  M54.41,G89.29 (ICD-10-CM) - Chronic right-sided low back pain with right-sided sciatica    Rationale for Evaluation and Treatment: Rehabilitation  THERAPY DIAG:  Radiculopathy, lumbosacral region  Bilateral hip pain  Muscle weakness (generalized)  ONSET DATE: 08/16/23 referral  SUBJECTIVE:                                                                                                                                                                                           SUBJECTIVE STATEMENT:   tape helps some, did not get brace- was not sure. Seeing MD in 2 weeks  PERTINENT HISTORY:  Patient is a 78 year old female  who is almost 9 years out from a right total hip arthroplasty.  She comes in today ambulating with a cane.  She has been having a hard time getting up from a seated position and up from the floor when she is down on the floor.  She reports sciatic pain on the right side and also some left lower extremity and right lower extremity radicular types of symptoms.  She is not a diabetic.  She takes anti-inflammatories over-the-counter on a rare basis.  She denies any change in bowel or bladder function.   On exam both hips move smoothly and fluidly with no blocks or rotation.  Most of her pain seems to be with positive straight leg raise on both sides more so on the right than the left but she does have left lower extremity radicular symptoms as well.  PAIN:  Are you having pain? Yes: NPRS scale: 4/10 Pain location: both hips, into legs, some pain in low back Pain description: becoming more frequent and intense, electrical shock, achy  Aggravating factors: bending, standing too long Relieving factors: nothing really  PRECAUTIONS: None  RED FLAGS: None   WEIGHT BEARING RESTRICTIONS: No  FALLS:  Has patient fallen in last 6 months? No  LIVING ENVIRONMENT: Lives with: lives with their spouse Lives in: House/apartment Stairs: Yes: External: 5 steps; bilateral but cannot reach both Has following equipment at home: Single point cane and Walker - 2 wheeled  PLOF: Independent and Independent with basic ADLs  PATIENT GOALS: to not hurt and I don't want to risk falling  NEXT MD VISIT: 09/20/23  OBJECTIVE:  Note: Objective measures were completed at Evaluation unless otherwise noted.  DIAGNOSTIC FINDINGS:  2 views of the lumbar spine show a significant grade 1 spondylolisthesis at L4-L5 with some degenerative changes as well. An AP pelvis and lateral of the right hip shows a well-seated right total hip  arthroplasty. There is moderate arthritis of the left hip.    COGNITION: Overall cognitive  status: Within functional limits for tasks assessed     SENSATION: WFL   POSTURE: rounded shoulders and forward head  PALPATION: Some tenderness in L2-L5  LUMBAR ROM:   AROM eval 09/28/23 10/03/23 10/20/23  Flexion Can touch toes, painful coming back up [facility] WFLS  Extension 25% with pain 25# with pain Limited 75% Limited 25%  Right lateral flexion Mid thigh with pain Limited 25% Limited 25% Limited 25%  Left lateral flexion Fibular head no pain Limited 25% Limited 25% WFLs  Right rotation 25% [employer] WFL   Left rotation 50% WFL WFL    (Blank rows = not tested)  LOWER EXTREMITY ROM:   grossly within functional ranges   LOWER EXTREMITY MMT:  grossly 5/5, some pain with resisted knee extension on RLE, hip extension 4/5  LUMBAR SPECIAL TESTS:  Straight leg raise test: Positive and FABER test: Positive  FUNCTIONAL TESTS:  5 times sit to stand: 10.39s Timed up and go (TUG): 15.53s w/o cane  GAIT: Distance walked: in clinic distances Assistive device utilized: Single point cane Level of assistance: Complete Independence  TREATMENT DATE:  12/14/23 Nustep L 5 8 min Step up for ADD LEFT LE 2 sets 10 with HHA with 4  inch ( 6 inch too hard/high) Foam mat side stepping 5 x in // bars Knee ext 10# 2 sets 10 with ball squeeze for VMO HS curl 25# 2 sets 10 with ball squeeze Trunk flex and ext with resistance 20 x with VMO ball squeeze     12/08/23 Nustep L 5 BERG 50/56 LAQ with ball squeeze 3# 2 sets 10 SAQ with ball squeeze 3# 2 sets 10 SLR with ER 3# 2 sets 10 3# side stepping 10 feet 3 x SL ADD 3# 10 x KT tape for laterally shifted left patella   12/01/23 Nustep L 5 Tandem gait on foam beam 5 x minimal UE support Side stepping on foam mat 5 x each way min UE support More square L 2 stepping fwd and SW STS 10 x from standard chair 10 x BIL LE And trunk PROM with STW to left ITB     11/14/23 BERG 47/56 Nustep L 5 6 inch lateral step across min  a with cuing to keep feet fwd 10 x Red tband seated IR/ER 15 x each Red tband side stepping on foam beam no hands CGA 3 x Tandem on beam no hands 2 x CGA Leg Press 30# 3 sets 10 feet 3 ways for hip Navigating various height steps no AD CGA  11/10/23 Nustep L 5 VMO squeeze with LAQ 2 sets 10 Standing on airex vextor taps CGA HHA on airex stepping laterally over rolls Double HHA to single HHA - CG-min A Obstacle course CGA with 1 LOB requiring min A to prevent fall Agility ladder- CGA 6 inch step up 5 x each leg without UE Up and down 2 steps 1 step at a time no hands cg- min A  11/07/23 5# cable pulley shld ext 2 sets 10 10# cable row 2 sets 10 10# cable pulleys hip 4 way 10x BIL Nustep L 5 Airex to 6 inch box step ups 5 x each leg then alternate 20 x- CGA and cuing HS curl 25# 2 sets 12 Knee ext 10 # 2 sets 12 Leg Press 30# 10x feet 3 way for hips STS with wt  ball chest press 10 x then OH 10 x   11/03/23 Nustep L 5 STS with wt ball chest press 10 x Standing wt ball trunk rotation 10 x each side 1 lap with farmers carry each hand Obstacle course Side stepping laterally in /out 6 in box- CGA Side stepping foam bean 6 x HHA 5# SL alt SL hip flex,ext and abd Sitting 5# lateral step up onto 6 in 20 x each 5# marching on airex 20 x 2 sets  10/31/23 Amb 250 feet outside without AD without painn but SOB and multi standing rest breaks needed STS with wt ball chest press 10 x Standing wt ball trunk rotation 10 x each side Leg press 20# 3 sets 10 Resisted trunk flex and ext 20 x Seated row 20 # 2 sets 10 Resisted gait side stepping 30# 4 x each way ADD green tband side stepping 10 feet 5 x each Step up fwd 6 in opp leg ext 10 x then side step up with opp leg ABD HS curl 25# 2 sets 10 Knee ext 10 # 2 sets 10 5# lateral flexion standing 15 x each    10/26/23 Nustep L 5 Ball toss on airex- CGA Walked 200 feet with ball toss - no LOB 5# lateral flexion 15 x  each Resisted trunk rotation 15 x each side- green tband STS with 5# chest press 10 x  Resisted trunk flex and ext seated 2 sets 10- blue tband Resisted gait side stepping 30# 4 x each way ADD ball squeeze 15 x Green tband clams and hip flex 2 sets 10 3# 6 in step taps 20 x 2 sets CGA 3# lateral step tap 12 each leg PROM, stretch and STW ( left IT and RT hip flexor) LE and trunk  10/24/23 5# farmers carry 1 lap each hand. Pain with wt in left hand 5# lateral flexion 15 x each side STS with 5# chest press 10 Nustep L 3 Step up 6 inch 5 x each leg, light hand support then 5 x more from airex Standing on airex ball toss- no LOB Lateral stepping over foam roll on iarex 10 x min a with LOB-setpping left harder Lateral steps 5 x each way with rail 4 in and 6 in 3# stepping over and into 6 inch box 5 x fwd and latrally 3# 6 in step taps 20 x 2 sets CG-minA     10/20/23 Assessed ROM and goals Nustep L 3 Step over step 2 rail 4 inch 4 x Step over step 2 rail 6 inch 3 x Step up 5 x each leg 6 inch 1 rail Step down 5 x each leg 6 inch 2 rail- RT leg weaker Leg press 20# 3 sets 10 20# resisted gait 5 x fwd and back, 3 x each side STS with yellow wt ball 10 x HS curl 20# 2 sets 10 PROM LE and trunk with STW to RT quad/hip flexor and Left ITB  10/13/22\5 Green tband seated - HS curl , hip flex and clams 2 sets 10 Seated 3 # LAQ 2 sets 10 BIl Seated 3# ER/IR 10 x BIL HHA 3# marching fwd and back 15 feet 2 x HHA 3# side stepping 15 feet 2x 3# ankle wt alt step tap 6 inch 20 x with SPC 2 sets 3# ankle step tap laterally 6 inch 10 x each STS on airex CGA 10 x Foam beam in // bars tandem fwd and back and  side stepping Gentle STW RT thigh/hip flex and left ITB Gentle PROM LE and trunk- cuing to relax  10/05/23 PROM LE and trunk  STW RT hip flex and quad on a stretch STW to Left ITB on stretch Pelvic ROM and stab ex on dyna disc 4 in step up fwd with opp hip ext 10 x with  UE 4 in step up with opp hip abd x 10 with UE Seated trunk flex and ext 2 sets 10 blue tband Educ in hip flex stretch off bed and adding rolling to RT thigh  10/03/23 PROM LE and trunk STW to distal ITB Feet on ball bridge,KTC and obl Green tband clams BIL 10 x then SL 10 x Green tband marching 10 x BIL  2 sets ADD ball squeeze 10 x then 10x with bridge SLR 10x BIL then SLR with abd 10 x 3# LAQ 20 x alt 3# hip flex 20 x alt 3# standing hip flex,ext and abd 10 x each  6in 3# step tap with SPC fwd and laterally 10 x each  09/28/23  Feet on ball bridge,KTC and obl- left knee catch with KTC, cuing with rotation to not twist so much Isometric abdominals 3 sec hold PROM LE and trunk  Left ITB stretching and STW- very tender. Instructed in rolling at home and stretching SL hip 3 way 10 x BIL In // bars on foam roll 3 x  laterally ADD ball squeeze 15 x Clams 2 sets 10 STS with wt ball OH 10 x    09/25/23 Walking 2 laps with SPC   Supine LTR, HS stretch, SKTC Feet on pball rotations and knees to chest  Ball squeeze 2x10 Bridges 2x10 STS 2x10 Calf stretch  Standing rows and ext red band 2x10  09/21/23  Declined Nustep- last time my pacemaker was really revving up and I was very concerned and surprised, I don't need that stress today   Spent some time discussing and educating on prior imaging findings (spondylolisthesis, hip OA, etc) and how it is possibly affecting (or not affecting) current pain levels   Supine hip flexor stretch 2x30 seconds B Supine frog stretch 2x30 seconds Piriformis stretch 2x30 seconds B  HS/gastroc stretch 2x30 seconds B  Seated TA set 15x3 seconds Seated TA set + mini-march x12 Seated TA set + opposite UE/LE flexion x10B STS x10 no UEs cues for eccentric lower     09/14/23 Nustep L 5 LE only at 4 1/2 min decreased resistance to 3 Black tband trunk ext 2 sets 10 ADD ball squeeze 2 sets 10 Clams red tband 2 sets 10  Red tband hip flex 2  sets 10 Iso abdominals 2 set s 10 Wt ball chest press then obl 10 x each seated 3# alt LAQ 20 x 3 # hip abd with flexion 10 x BIL 2 sets  Standing 3# hip ext and abd 10 x Legs on ball bridge 2 sets 10 Legs on ball obl 20 x     09/05/23 EVAL  PATIENT EDUCATION:  Education details: POC, HEP, sciatica, hip flexor tightness sleeping in recliners Person educated: Patient Education method: Medical illustrator Education comprehension: verbalized understanding and returned demonstration  HOME EXERCISE PROGRAM:  Added to HEP. LAQ with ball squeeze, SL hip ADD and SLR with ER Access Code: [email or web address] URL: [email or web address] Date: 10/03/2023 Prepared by: Neymar Dowe  Exercises - Supine Bridge  - 1 x daily - 7 x weekly - 1 sets - 10 reps - 3 hold - Supine Hip Adduction Isometric with Ball  - 1 x daily - 7 x weekly - 2 sets - 10 reps - Supine March  - 1 x daily - 7 x weekly - 2 sets - 10 reps - Hooklying Clamshell with Resistance  - 1 x daily - 7 x weekly - 2 sets - 10 reps - Supine Straight Leg Raises  - 1 x daily - 7 x weekly - 3 sets - 10 reps - Seated Long Arc Quad  - 1 x daily - 7 x weekly - 2 sets - 10 reps - Seated March  - 1 x daily - 7 x weekly - 2 sets - 10 reps - Seated Hip Flexion and External Rotation  - 1 x daily - 7 x weekly - 2 sets - 10 reps - Standing March with Counter Support  - 1 x daily - 7 x weekly - 1 sets - 10 reps - Standing Hip Abduction with Counter Support  - 1 x daily - 7 x weekly - 1 sets - 10 reps - Standing Hip Extension with Counter Support  - 1 x daily - 7 x weekly - 1 sets - 10 reps Access Code: [email or web address] URL: [email or web address] Date: 09/05/2023 Prepared by: Almetta Fam  Exercises - Supine Lower Trunk Rotation  - 1 x daily - 7 x weekly - 2 sets - 10 reps - Supine Bridge  - 1 x daily  - 7 x weekly - 2 sets - 10 reps - Supine Figure 4 Piriformis Stretch  - 1 x daily - 7 x weekly - 2 reps - 15 hold - Modified Thomas Stretch  - 1 x daily - 7 x weekly - 2 reps - 15 hold  ASSESSMENT:  CLINICAL IMPRESSION:   Progressed LE strengthening with emphasis on VMO activation. Will do reneal next session.patella better alignment  OBJECTIVE IMPAIRMENTS: Abnormal gait, decreased activity tolerance, decreased balance, decreased ROM, decreased strength, and pain.     ACTIVITY LIMITATIONS: bending, squatting, stairs, and locomotion level  PARTICIPATION LIMITATIONS: cleaning, laundry, driving, shopping, community activity, and yard work  PERSONAL FACTORS: Age, Past/current experiences, Time since onset of injury/illness/exacerbation, and 1-2 comorbidities: arthritis, hx of surgery are also affecting patient's functional outcome.   REHAB POTENTIAL: Good  CLINICAL DECISION MAKING: Stable/uncomplicated  EVALUATION COMPLEXITY: Low  GOALS: Goals reviewed with patient? Yes  SHORT TERM GOALS: Target date: 10/10/23  Patient will be independent with initial HEP.  Baseline:  Goal status: 09/14/23 progressing  09/28/23 MET  2.  Patient will report centralization of radicular symptoms.  Baseline: pain in both legs Goal status: 09/28/23 no changes   10/03/23 progressing   MET 10/20/23   LONG TERM GOALS: Target date: 11/14/23  Patient will be independent with advanced/ongoing HEP to improve outcomes and carryover.  Baseline:  Goal status: MET 10/20/23 btwn HEP and ex class  2.  Patient will report 50-75% improvement in low back pain to improve QOL.  Baseline: 5/10 Goal status: 10/03/23 progressing  10/20/23 40% progressing and  10/24/23 10/31/23 progressing   11/14/23 progressing  3.  Patient will demonstrate full pain free lumbar ROM to perform ADLs.   Baseline: see chart Goal status: 10/03/23 progressing  and 10/20/23  and 10/24/23. 9/9 WFL except ext limited 50%  12/01/23 WFLs ext limited 75%  4.   Patient will be up and down a set of stairs without pain.  Baseline: pain going up and down Goal status: ongoing 10/03/23  progressing 10/20/23  and 10/24/23  and 11/10/23  and 11/14/23  5.  Patient will be able to sleep in her bed again for at least 3-4 hrs.  Baseline: 1 hr tolerance  Goal status: 10/03/23 progressing  and 10/20/23. 10/31/23 progressing slowly- hard to get comfortable  progressing 11/10/23  12/01/23 on going  6.  Patient will tolerate 30 min of standing to perform ADLs and household chores Baseline: ~10 mins Goal status: 10/03/23 progressing 10/20/23 progressing 15-20  . MET 10/24/23 pt stated stated did 30-45 min  7.  Goal added 11/14/23 Will increase Berg to 52/56 for increased safety and function to reduce risk for falls. Baseline:  11/14/23 47/56  12/01/23 progressing  8.  Increase Gait to be able to walk 10 minutes without assistive device.  12/01/23 progressing PLAN:  PT FREQUENCY: 2x/week  PT DURATION: 10 weeks  PLANNED INTERVENTIONS: 97110-Therapeutic exercises, 97530- Therapeutic activity, 97112- Neuromuscular re-education, 97535- Self Care, [id number]- Manual therapy, [phone]- Gait training, [phone]- Electrical stimulation (unattended), [id number]- Vasopneumatic device, [id number]- Traction (mechanical), [phone] (1-2 muscles), [phone] (3+ muscles)- Dry Needling, Patient/Family education, Balance training, Stair training, Taping, Joint mobilization, Spinal mobilization, Cryotherapy, and Moist heat.  PLAN FOR NEXT SESSION: renewal next visit   Jon Or PTA 12/14/23 9:37 AM

## 2023-12-15 ENCOUNTER — Ambulatory Visit: Admitting: Physical Therapy

## 2023-12-15 DIAGNOSIS — M25552 Pain in left hip: Secondary | ICD-10-CM | POA: Diagnosis not present

## 2023-12-15 DIAGNOSIS — M6281 Muscle weakness (generalized): Secondary | ICD-10-CM

## 2023-12-15 DIAGNOSIS — M5417 Radiculopathy, lumbosacral region: Secondary | ICD-10-CM

## 2023-12-15 DIAGNOSIS — M25551 Pain in right hip: Secondary | ICD-10-CM | POA: Diagnosis not present

## 2023-12-15 NOTE — Therapy (Signed)
 OUTPATIENT PHYSICAL THERAPY THORACOLUMBAR TREATMENT     Patient Name: Margaret Reese MRN: [id number] DOB:January 06, 1946, 78 y.o., female Today's Date: 12/15/2023  END OF SESSION:  PT End of Session - 12/15/23 0914     Visit Number 20    PT Start Time 0925    PT Stop Time 1005    PT Time Calculation (min) 40 min            Past Medical History:  Diagnosis Date   Allergy    Anxiety    Arthritis    Asthma    Complication of anesthesia    pt states has difficulty awakening; also has increased sinus drainage   Constipation    Edema, lower extremity    Encounter for interrogation of cardiac pacemaker 09/05/2018   Falls    GERD (gastroesophageal reflux disease)    H/O back injury    History of bronchitis    History of colon polyps    Hypertension    Imbalance    Kidney cysts    pt states not sure which kidney does see kidney specialist yearly pt states every thing okay currently    Legally blind in right eye, as defined in [place]     Mobitz type II atrioventricular block 11/08/2016   Multiple gastric ulcers    Obesity    OSA (obstructive sleep apnea) 05/03/2022   Pacemaker S/P [employer] Jude [employer]urity MRI model [id number] 05/20/2016   Renal stone    Retinal vein occlusion    Shortness of breath    Sinus node dysfunction (HCC) 12/16/2018   Tingling    left arm    Trace cataracts    Urinary frequency    Vitamin D  deficiency    Past Surgical History:  Procedure Laterality Date   CARDIOVASCULAR STRESS TEST  dec 2016   CHOLECYSTECTOMY     COLONOSCOPY  2011   EYE SURGERY Bilateral    Cataract surgery.    LEFT HEART CATH AND CORONARY ANGIOGRAPHY N/A 05/19/2016   Procedure: Left Heart Cath and Coronary Angiography;  Surgeon: Salena Negri, MD;  Location: [facility];  Service: Cardiovascular;  Laterality: N/A;   PACEMAKER IMPLANT N/A 05/20/2016   Procedure: Pacemaker Implant;  Surgeon: Will Gladis Norton, MD;  Location: [facility];  Service: Cardiovascular;   Laterality: N/A;   TONSILLECTOMY     TOTAL HIP ARTHROPLASTY Right 01/30/2015   Procedure: RIGHT TOTAL HIP ARTHROPLASTY ANTERIOR APPROACH AND REMOVAL LIPOMA RIGHT HIP;  Surgeon: Lonni CINDERELLA Poli, MD;  Location: [facility];  Service: Orthopedics;  Laterality: Right;   UPPER GI ENDOSCOPY     Patient Active Problem List   Diagnosis Date Noted   Back pain 12/26/2022   Fluid retention in legs 11/13/2022   Stress 06/14/2022   Obesity (HCC)- Start BMI 40.06 05/12/2022   OSA (obstructive sleep apnea) 05/03/2022   Hyperglycemia 03/15/2022   Heart block AV complete (HCC) 01/05/2022   Pulmonary hypertension (HCC) 11/19/2021   Chronic diastolic (congestive) heart failure (HCC) 06/01/2020   Pacemaker S/P St Jude Medical Assurity MRI model [id number] 05/20/2016   History of total right hip arthroplasty 01/30/2015   Arthritis 09/11/2014   Renal cyst 09/04/2014   Allergic rhinitis due to pollen 09/04/2014   Essential hypertension 07/08/2010   GERD (gastroesophageal reflux disease) 07/08/2010   Calcium oxalate renal stones 07/08/2010   Hyperlipidemia 07/08/2010    PCP: Norleen Jobs  REFERRING PROVIDER: Lonni Poli  REFERRING DIAG:  [phone] (ICD-10-CM) - History of total  right hip arthroplasty  M54.41,G89.29 (ICD-10-CM) - Chronic right-sided low back pain with right-sided sciatica    Rationale for Evaluation and Treatment: Rehabilitation  THERAPY DIAG:  Radiculopathy, lumbosacral region  Bilateral hip pain  Muscle weakness (generalized)  ONSET DATE: 08/16/23 referral  SUBJECTIVE:                                                                                                                                                                                           SUBJECTIVE STATEMENT:   amb in with SPC states very sore from yesterday PERTINENT HISTORY:  Patient is a 78 year old female who is almost 9 years out from a right total hip arthroplasty.  She comes in today ambulating  with a cane.  She has been having a hard time getting up from a seated position and up from the floor when she is down on the floor.  She reports sciatic pain on the right side and also some left lower extremity and right lower extremity radicular types of symptoms.  She is not a diabetic.  She takes anti-inflammatories over-the-counter on a rare basis.  She denies any change in bowel or bladder function.   On exam both hips move smoothly and fluidly with no blocks or rotation.  Most of her pain seems to be with positive straight leg raise on both sides more so on the right than the left but she does have left lower extremity radicular symptoms as well.  PAIN:  Are you having pain? 5/10 LLE  PRECAUTIONS: None  RED FLAGS: None   WEIGHT BEARING RESTRICTIONS: No  FALLS:  Has patient fallen in last 6 months? No  LIVING ENVIRONMENT: Lives with: lives with their spouse Lives in: House/apartment Stairs: Yes: External: 5 steps; bilateral but cannot reach both Has following equipment at home: Single point cane and Walker - 2 wheeled  PLOF: Independent and Independent with basic ADLs  PATIENT GOALS: to not hurt and I don't want to risk falling  NEXT MD VISIT: 09/20/23  OBJECTIVE:  Note: Objective measures were completed at Evaluation unless otherwise noted.  DIAGNOSTIC FINDINGS:  2 views of the lumbar spine show a significant grade 1 spondylolisthesis at L4-L5 with some degenerative changes as well. An AP pelvis and lateral of the right hip shows a well-seated right total hip arthroplasty. There is moderate arthritis of the left hip.    COGNITION: Overall cognitive status: Within functional limits for tasks assessed     SENSATION: WFL   POSTURE: rounded shoulders and forward head  PALPATION: Some tenderness in L2-L5  LUMBAR ROM:   AROM eval 09/28/23 10/03/23 10/20/23  Flexion Can  touch toes, painful coming back up [facility] WFLS  Extension 25% with pain 25# with pain Limited  75% Limited 25%  Right lateral flexion Mid thigh with pain Limited 25% Limited 25% Limited 25%  Left lateral flexion Fibular head no pain Limited 25% Limited 25% WFLs  Right rotation 25% [facility] WFL   Left rotation 50% WFL WFL    (Blank rows = not tested)  LOWER EXTREMITY ROM:   grossly within functional ranges   LOWER EXTREMITY MMT:  grossly 5/5, some pain with resisted knee extension on RLE, hip extension 4/5  LUMBAR SPECIAL TESTS:  Straight leg raise test: Positive and FABER test: Positive  FUNCTIONAL TESTS:  5 times sit to stand: 10.39s Timed up and go (TUG): 15.53s w/o cane  GAIT: Distance walked: in clinic distances Assistive device utilized: Single point cane Level of assistance: Complete Independence  TREATMENT DATE:  12/15/23 BERG 50/56 Nustep L 5 More Square- balance and stepping activities Vector taps with green tband 10 x BIL Side stepping green tband 10 feet 5 x each Standing on BIL dyna disc func reaching and mini squats 6 inch alt step taps 20 x 2 sets KT tape to left patella to shift medially    12/14/23 Nustep L 5 8 min Step up for ADD LEFT LE 2 sets 10 with HHA with 4  inch ( 6 inch too hard/high) Foam mat side stepping 5 x in // bars Knee ext 10# 2 sets 10 with ball squeeze for VMO HS curl 25# 2 sets 10 with ball squeeze Trunk flex and ext with resistance 20 x with VMO ball squeeze     12/08/23 Nustep L 5 BERG 50/56 LAQ with ball squeeze 3# 2 sets 10 SAQ with ball squeeze 3# 2 sets 10 SLR with ER 3# 2 sets 10 3# side stepping 10 feet 3 x SL ADD 3# 10 x KT tape for laterally shifted left patella   12/01/23 Nustep L 5 Tandem gait on foam beam 5 x minimal UE support Side stepping on foam mat 5 x each way min UE support More square L 2 stepping fwd and SW STS 10 x from standard chair 10 x BIL LE And trunk PROM with STW to left ITB     11/14/23 BERG 47/56 Nustep L 5 6 inch lateral step across min a with cuing to  keep feet fwd 10 x Red tband seated IR/ER 15 x each Red tband side stepping on foam beam no hands CGA 3 x Tandem on beam no hands 2 x CGA Leg Press 30# 3 sets 10 feet 3 ways for hip Navigating various height steps no AD CGA       09/05/23 EVAL                                                                                                                                PATIENT EDUCATION:  Education details:  POC, HEP, sciatica, hip flexor tightness sleeping in recliners Person educated: Patient Education method: Explanation and Demonstration Education comprehension: verbalized understanding and returned demonstration  HOME EXERCISE PROGRAM:  Added to HEP. LAQ with ball squeeze, SL hip ADD and SLR with ER Access Code: [email or web address] URL: [email or web address] Date: 10/03/2023 Prepared by: Leelah Hanna  Exercises - Supine Bridge  - 1 x daily - 7 x weekly - 1 sets - 10 reps - 3 hold - Supine Hip Adduction Isometric with Ball  - 1 x daily - 7 x weekly - 2 sets - 10 reps - Supine March  - 1 x daily - 7 x weekly - 2 sets - 10 reps - Hooklying Clamshell with Resistance  - 1 x daily - 7 x weekly - 2 sets - 10 reps - Supine Straight Leg Raises  - 1 x daily - 7 x weekly - 3 sets - 10 reps - Seated Long Arc Quad  - 1 x daily - 7 x weekly - 2 sets - 10 reps - Seated March  - 1 x daily - 7 x weekly - 2 sets - 10 reps - Seated Hip Flexion and External Rotation  - 1 x daily - 7 x weekly - 2 sets - 10 reps - Standing March with Counter Support  - 1 x daily - 7 x weekly - 1 sets - 10 reps - Standing Hip Abduction with Counter Support  - 1 x daily - 7 x weekly - 1 sets - 10 reps - Standing Hip Extension with Counter Support  - 1 x daily - 7 x weekly - 1 sets - 10 reps Access Code: [email or web address] URL: [email or web address] Date: 09/05/2023 Prepared by: Almetta Fam  Exercises - Supine Lower Trunk Rotation  - 1 x daily - 7 x weekly - 2 sets - 10 reps - Supine Bridge  - 1 x  daily - 7 x weekly - 2 sets - 10 reps - Supine Figure 4 Piriformis Stretch  - 1 x daily - 7 x weekly - 2 reps - 15 hold - Modified Thomas Stretch  - 1 x daily - 7 x weekly - 2 reps - 15 hold  ASSESSMENT:  CLINICAL IMPRESSION:   Pt had a slight regression after vacation 2 weeks ago, increased pain esp in knee. Left patella is very laterally shifted and at times appears to be out of groove but did respond well to tape and may benefit from brace but waiting to see MD first. Pnt continues to be very compliant with HEP for strength and balance as well as STW to ITB. Hip weakness noted on left and VMO weakness. Balance improving but still very fearful of falling.pnt will benefit from additional PT services to maximize strength and balance. OBJECTIVE IMPAIRMENTS: Abnormal gait, decreased activity tolerance, decreased balance, decreased ROM, decreased strength, and pain.     ACTIVITY LIMITATIONS: bending, squatting, stairs, and locomotion level  PARTICIPATION LIMITATIONS: cleaning, laundry, driving, shopping, community activity, and yard work  PERSONAL FACTORS: Age, Past/current experiences, Time since onset of injury/illness/exacerbation, and 1-2 comorbidities: arthritis, hx of surgery are also affecting patient's functional outcome.   REHAB POTENTIAL: Good  CLINICAL DECISION MAKING: Stable/uncomplicated  EVALUATION COMPLEXITY: Low  GOALS: Goals reviewed with patient? Yes  SHORT TERM GOALS: Target date: 10/10/23  Patient will be independent with initial HEP.  Baseline:  Goal status: 09/14/23 progressing  09/28/23 MET  2.  Patient will report centralization of radicular symptoms.  Baseline: pain in both legs Goal status:  09/28/23 no changes   10/03/23 progressing   MET 10/20/23   LONG TERM GOALS: Target date: 01/12/24  Patient will be independent with advanced/ongoing HEP to improve outcomes and carryover.  Baseline:  Goal status: MET 10/20/23 btwn HEP and ex class  2.  Patient will report  50-75% improvement in low back pain to improve QOL.  Baseline: 5/10 Goal status: 10/03/23 progressing  10/20/23 40% progressing and 10/24/23 10/31/23 progressing   11/14/23 progressing  12/15/23 progressing  3.  Patient will demonstrate full pain free lumbar ROM to perform ADLs.   Baseline: see chart Goal status: 10/03/23 progressing  and 10/20/23  and 10/24/23. 9/9 WFL except ext limited 50%  12/01/23 WFLs ext limited 75%  12/15/23 WFL minus extension limited 75%  4.  Patient will be up and down a set of stairs without pain.  Baseline: pain going up and down Goal status: ongoing 10/03/23  progressing 10/20/23  and 10/24/23  and 11/10/23  and 11/14/23  12/15/23 varies but overall reports much better  5.  Patient will be able to sleep in her bed again for at least 3-4 hrs.  Baseline: 1 hr tolerance  Goal status: 10/03/23 progressing  and 10/20/23. 10/31/23 progressing slowly- hard to get comfortable  progressing 11/10/23  12/01/23 on going  and 12/15/23  6.  Patient will tolerate 30 min of standing to perform ADLs and household chores Baseline: ~10 mins Goal status: 10/03/23 progressing 10/20/23 progressing 15-20  . MET 10/24/23 pt stated stated did 30-45 min  7.  Goal added 11/14/23 Will increase Berg to 52/56 for increased safety and function to reduce risk for falls. Baseline:  11/14/23 47/56  12/01/23 progressing   12/15/23 50/56 progressing  8.  Increase Gait to be able to walk 10 minutes without assistive device.  12/01/23 progressing PLAN:  PT FREQUENCY: 2x/week  PT DURATION: 10 weeks  PLANNED INTERVENTIONS: 97110-Therapeutic exercises, 97530- Therapeutic activity, 97112- Neuromuscular re-education, 97535- Self Care, [id number]- Manual therapy, [phone]- Gait training, [phone]- Electrical stimulation (unattended), [id number]- Vasopneumatic device, [id number]- Traction (mechanical), [phone] (1-2 muscles), [id number] (3+ muscles)- Dry Needling, Patient/Family education, Balance training, Stair training, Taping, Joint mobilization,  Spinal mobilization, Cryotherapy, and Moist heat.  PLAN FOR NEXT SESSION: recert done. MD 2 weeks  Jon Or PTA 12/15/23 10:01 AM

## 2023-12-20 ENCOUNTER — Ambulatory Visit (INDEPENDENT_AMBULATORY_CARE_PROVIDER_SITE_OTHER): Payer: PRIVATE HEALTH INSURANCE | Admitting: Physician Assistant

## 2023-12-21 ENCOUNTER — Ambulatory Visit: Admitting: Physical Therapy

## 2023-12-21 DIAGNOSIS — M6281 Muscle weakness (generalized): Secondary | ICD-10-CM

## 2023-12-21 DIAGNOSIS — M25551 Pain in right hip: Secondary | ICD-10-CM

## 2023-12-21 DIAGNOSIS — M5417 Radiculopathy, lumbosacral region: Secondary | ICD-10-CM

## 2023-12-21 DIAGNOSIS — M25552 Pain in left hip: Secondary | ICD-10-CM | POA: Diagnosis not present

## 2023-12-21 NOTE — Therapy (Signed)
 OUTPATIENT PHYSICAL THERAPY THORACOLUMBAR TREATMENT     Patient Name: Margaret Reese MRN: [id number] DOB:10-22-1945, 78 y.o., female Today's Date: 12/21/2023  END OF SESSION:  PT End of Session - 12/21/23 0917     Visit Number 21    Date for Recertification  01/12/24    Authorization Type Medicare    PT Start Time 0925    PT Stop Time 1010    PT Time Calculation (min) 45 min            Past Medical History:  Diagnosis Date   Allergy    Anxiety    Arthritis    Asthma    Complication of anesthesia    pt states has difficulty awakening; also has increased sinus drainage   Constipation    Edema, lower extremity    Encounter for interrogation of cardiac pacemaker 09/05/2018   Falls    GERD (gastroesophageal reflux disease)    H/O back injury    History of bronchitis    History of colon polyps    Hypertension    Imbalance    Kidney cysts    pt states not sure which kidney does see kidney specialist yearly pt states every thing okay currently    Legally blind in right eye, as defined in [place]     Mobitz type II atrioventricular block 11/08/2016   Multiple gastric ulcers    Obesity    OSA (obstructive sleep apnea) 05/03/2022   Pacemaker S/P St Jude Medical Assurity MRI model [id number] 05/20/2016   Renal stone    Retinal vein occlusion    Shortness of breath    Sinus node dysfunction (HCC) 12/16/2018   Tingling    left arm    Trace cataracts    Urinary frequency    Vitamin D  deficiency    Past Surgical History:  Procedure Laterality Date   CARDIOVASCULAR STRESS TEST  dec 2016   CHOLECYSTECTOMY     COLONOSCOPY  2011   EYE SURGERY Bilateral    Cataract surgery.    LEFT HEART CATH AND CORONARY ANGIOGRAPHY N/A 05/19/2016   Procedure: Left Heart Cath and Coronary Angiography;  Surgeon: Salena Negri, MD;  Location: [facility];  Service: Cardiovascular;  Laterality: N/A;   PACEMAKER IMPLANT N/A 05/20/2016   Procedure: Pacemaker Implant;  Surgeon: Will Gladis Norton, MD;  Location: [facility];  Service: Cardiovascular;  Laterality: N/A;   TONSILLECTOMY     TOTAL HIP ARTHROPLASTY Right 01/30/2015   Procedure: RIGHT TOTAL HIP ARTHROPLASTY ANTERIOR APPROACH AND REMOVAL LIPOMA RIGHT HIP;  Surgeon: Lonni CINDERELLA Poli, MD;  Location: [facility];  Service: Orthopedics;  Laterality: Right;   UPPER GI ENDOSCOPY     Patient Active Problem List   Diagnosis Date Noted   Back pain 12/26/2022   Fluid retention in legs 11/13/2022   Stress 06/14/2022   Obesity (HCC)- Start BMI 40.06 05/12/2022   OSA (obstructive sleep apnea) 05/03/2022   Hyperglycemia 03/15/2022   Heart block AV complete (HCC) 01/05/2022   Pulmonary hypertension (HCC) 11/19/2021   Chronic diastolic (congestive) heart failure (HCC) 06/01/2020   Pacemaker S/P St Jude Medical Assurity MRI model [id number] 05/20/2016   History of total right hip arthroplasty 01/30/2015   Arthritis 09/11/2014   Renal cyst 09/04/2014   Allergic rhinitis due to pollen 09/04/2014   Essential hypertension 07/08/2010   GERD (gastroesophageal reflux disease) 07/08/2010   Calcium oxalate renal stones 07/08/2010   Hyperlipidemia 07/08/2010    PCP: Norleen Jobs  REFERRING PROVIDER: Lonni Poli  REFERRING DIAG:  [phone] (ICD-10-CM) - History of total right hip arthroplasty  M54.41,G89.29 (ICD-10-CM) - Chronic right-sided low back pain with right-sided sciatica    Rationale for Evaluation and Treatment: Rehabilitation  THERAPY DIAG:  Radiculopathy, lumbosacral region  Bilateral hip pain  Muscle weakness (generalized)  ONSET DATE: 08/16/23 referral  SUBJECTIVE:                                                                                                                                                                                           SUBJECTIVE STATEMENT:   tape helps. Feeling better. Feel like knee is located better. MD next week PERTINENT HISTORY:  Patient is a 78 year old  female who is almost 9 years out from a right total hip arthroplasty.  She comes in today ambulating with a cane.  She has been having a hard time getting up from a seated position and up from the floor when she is down on the floor.  She reports sciatic pain on the right side and also some left lower extremity and right lower extremity radicular types of symptoms.  She is not a diabetic.  She takes anti-inflammatories over-the-counter on a rare basis.  She denies any change in bowel or bladder function.   On exam both hips move smoothly and fluidly with no blocks or rotation.  Most of her pain seems to be with positive straight leg raise on both sides more so on the right than the left but she does have left lower extremity radicular symptoms as well.  PAIN:  Are you having pain? 5/10 LLE  PRECAUTIONS: None  RED FLAGS: None   WEIGHT BEARING RESTRICTIONS: No  FALLS:  Has patient fallen in last 6 months? No  LIVING ENVIRONMENT: Lives with: lives with their spouse Lives in: House/apartment Stairs: Yes: External: 5 steps; bilateral but cannot reach both Has following equipment at home: Single point cane and Walker - 2 wheeled  PLOF: Independent and Independent with basic ADLs  PATIENT GOALS: to not hurt and I don't want to risk falling  NEXT MD VISIT: 09/20/23  OBJECTIVE:  Note: Objective measures were completed at Evaluation unless otherwise noted.  DIAGNOSTIC FINDINGS:  2 views of the lumbar spine show a significant grade 1 spondylolisthesis at L4-L5 with some degenerative changes as well. An AP pelvis and lateral of the right hip shows a well-seated right total hip arthroplasty. There is moderate arthritis of the left hip.    COGNITION: Overall cognitive status: Within functional limits for tasks assessed     SENSATION: WFL   POSTURE: rounded shoulders and forward head  PALPATION: Some tenderness in L2-L5  LUMBAR ROM:   AROM eval 09/28/23 10/03/23 10/20/23  Flexion Can  touch toes, painful coming back up [facility] WFLS  Extension 25% with pain 25# with pain Limited 75% Limited 25%  Right lateral flexion Mid thigh with pain Limited 25% Limited 25% Limited 25%  Left lateral flexion Fibular head no pain Limited 25% Limited 25% WFLs  Right rotation 25% [facility] WFL   Left rotation 50% WFL WFL    (Blank rows = not tested)  LOWER EXTREMITY ROM:   grossly within functional ranges   LOWER EXTREMITY MMT:  grossly 5/5, some pain with resisted knee extension on RLE, hip extension 4/5  LUMBAR SPECIAL TESTS:  Straight leg raise test: Positive and FABER test: Positive  FUNCTIONAL TESTS:  5 times sit to stand: 10.39s Timed up and go (TUG): 15.53s w/o cane  GAIT: Distance walked: in clinic distances Assistive device utilized: Single point cane Level of assistance: Complete Independence  TREATMENT DATE:  12/21/23 Taped left knee for medial pull and educ pt and husband on taping 3# SLR 2 sets 10 3 # SLR with ER 2 sets 10 3# KTC 2 sets 10 3# IR and ER 2 sets 10 Nustep L 5 Resisted gait Stepping actvities  Faom mat for balance  12/15/23 BERG 50/56 Nustep L 5 More Square- balance and stepping activities Vector taps with green tband 10 x BIL Side stepping green tband 10 feet 5 x each Standing on BIL dyna disc func reaching and mini squats 6 inch alt step taps 20 x 2 sets KT tape to left patella to shift medially    12/14/23 Nustep L 5 8 min Step up for ADD LEFT LE 2 sets 10 with HHA with 4  inch ( 6 inch too hard/high) Foam mat side stepping 5 x in // bars Knee ext 10# 2 sets 10 with ball squeeze for VMO HS curl 25# 2 sets 10 with ball squeeze Trunk flex and ext with resistance 20 x with VMO ball squeeze     12/08/23 Nustep L 5 BERG 50/56 LAQ with ball squeeze 3# 2 sets 10 SAQ with ball squeeze 3# 2 sets 10 SLR with ER 3# 2 sets 10 3# side stepping 10 feet 3 x SL ADD 3# 10 x KT tape for laterally shifted left  patella   12/01/23 Nustep L 5 Tandem gait on foam beam 5 x minimal UE support Side stepping on foam mat 5 x each way min UE support More square L 2 stepping fwd and SW STS 10 x from standard chair 10 x BIL LE And trunk PROM with STW to left ITB     11/14/23 BERG 47/56 Nustep L 5 6 inch lateral step across min a with cuing to keep feet fwd 10 x Red tband seated IR/ER 15 x each Red tband side stepping on foam beam no hands CGA 3 x Tandem on beam no hands 2 x CGA Leg Press 30# 3 sets 10 feet 3 ways for hip Navigating various height steps no AD CGA       09/05/23 EVAL  PATIENT EDUCATION:  Education details: POC, HEP, sciatica, hip flexor tightness sleeping in recliners Person educated: Patient Education method: Medical Illustrator Education comprehension: verbalized understanding and returned demonstration  HOME EXERCISE PROGRAM:  Added to HEP. LAQ with ball squeeze, SL hip ADD and SLR with ER Access Code: [email or web address] URL: [email or web address] Date: 10/03/2023 Prepared by: Margaret Reese  Exercises - Supine Bridge  - 1 x daily - 7 x weekly - 1 sets - 10 reps - 3 hold - Supine Hip Adduction Isometric with Ball  - 1 x daily - 7 x weekly - 2 sets - 10 reps - Supine March  - 1 x daily - 7 x weekly - 2 sets - 10 reps - Hooklying Clamshell with Resistance  - 1 x daily - 7 x weekly - 2 sets - 10 reps - Supine Straight Leg Raises  - 1 x daily - 7 x weekly - 3 sets - 10 reps - Seated Long Arc Quad  - 1 x daily - 7 x weekly - 2 sets - 10 reps - Seated March  - 1 x daily - 7 x weekly - 2 sets - 10 reps - Seated Hip Flexion and External Rotation  - 1 x daily - 7 x weekly - 2 sets - 10 reps - Standing March with Counter Support  - 1 x daily - 7 x weekly - 1 sets - 10 reps - Standing Hip Abduction with Counter Support  - 1 x  daily - 7 x weekly - 1 sets - 10 reps - Standing Hip Extension with Counter Support  - 1 x daily - 7 x weekly - 1 sets - 10 reps Access Code: [email or web address] URL: [email or web address] Date: 09/05/2023 Prepared by: Almetta Fam  Exercises - Supine Lower Trunk Rotation  - 1 x daily - 7 x weekly - 2 sets - 10 reps - Supine Bridge  - 1 x daily - 7 x weekly - 2 sets - 10 reps - Supine Figure 4 Piriformis Stretch  - 1 x daily - 7 x weekly - 2 reps - 15 hold - Modified Thomas Stretch  - 1 x daily - 7 x weekly - 2 reps - 15 hold  ASSESSMENT:  CLINICAL IMPRESSION:   Pt had a slight regression after vacation 2 weeks ago, increased pain esp in knee. Left patella is very laterally shifted and at times appears to be out of groove but did respond well to tape and may benefit from brace but waiting to see MD first. Pnt continues to be very compliant with HEP for strength and balance as well as STW to ITB. Educ pnt and spouse on KT taping for knee support.Hip weakness noted on left and VMO weakness. Balance improving but still very fearful of falling.pnt will benefit from additional PT services to maximize strength and balance. OBJECTIVE IMPAIRMENTS: Abnormal gait, decreased activity tolerance, decreased balance, decreased ROM, decreased strength, and pain.     ACTIVITY LIMITATIONS: bending, squatting, stairs, and locomotion level  PARTICIPATION LIMITATIONS: cleaning, laundry, driving, shopping, community activity, and yard work  PERSONAL FACTORS: Age, Past/current experiences, Time since onset of injury/illness/exacerbation, and 1-2 comorbidities: arthritis, hx of surgery are also affecting patient's functional outcome.   REHAB POTENTIAL: Good  CLINICAL DECISION MAKING: Stable/uncomplicated  EVALUATION COMPLEXITY: Low  GOALS: Goals reviewed with patient? Yes  SHORT TERM GOALS: Target date: 10/10/23  Patient will be independent with initial HEP.  Baseline:  Goal status: 09/14/23  progressing  09/28/23 MET  2.  Patient will report centralization of radicular symptoms.  Baseline: pain in both legs Goal status: 09/28/23 no changes   10/03/23 progressing   MET 10/20/23   LONG TERM GOALS: Target date: 01/12/24  Patient will be independent with advanced/ongoing HEP to improve outcomes and carryover.  Baseline:  Goal status: MET 10/20/23 btwn HEP and ex class  2.  Patient will report 50-75% improvement in low back pain to improve QOL.  Baseline: 5/10 Goal status: 10/03/23 progressing  10/20/23 40% progressing and 10/24/23 10/31/23 progressing   11/14/23 progressing  12/15/23 progressing 12/21/23 progressing  3.  Patient will demonstrate full pain free lumbar ROM to perform ADLs.   Baseline: see chart Goal status: 10/03/23 progressing  and 10/20/23  and 10/24/23. 9/9 WFL except ext limited 50%  12/01/23 WFLs ext limited 75%  12/15/23 WFL minus extension limited 75%  and 12/21/23  4.  Patient will be up and down a set of stairs without pain.  Baseline: pain going up and down Goal status: ongoing 10/03/23  progressing 10/20/23  and 10/24/23  and 11/10/23  and 11/14/23  12/15/23 varies but overall reports much better  5.  Patient will be able to sleep in her bed again for at least 3-4 hrs.  Baseline: 1 hr tolerance  Goal status: 10/03/23 progressing  and 10/20/23. 10/31/23 progressing slowly- hard to get comfortable  progressing 11/10/23  12/01/23 on going  and 12/15/23  6.  Patient will tolerate 30 min of standing to perform ADLs and household chores Baseline: ~10 mins Goal status: 10/03/23 progressing 10/20/23 progressing 15-20  . MET 10/24/23 pt stated stated did 30-45 min  7.  Goal added 11/14/23 Will increase Berg to 52/56 for increased safety and function to reduce risk for falls. Baseline:  11/14/23 47/56  12/01/23 progressing   12/15/23 50/56 progressing  8.  Increase Gait to be able to walk 10 minutes without assistive device.  12/01/23 progressing PLAN:  PT FREQUENCY: 2x/week  PT  DURATION: 10 weeks  PLANNED INTERVENTIONS: 97110-Therapeutic exercises, 97530- Therapeutic activity, 97112- Neuromuscular re-education, 97535- Self Care, [id number]- Manual therapy, [phone]- Gait training, [phone]- Electrical stimulation (unattended), 97016- Vasopneumatic device, [id number]- Traction (mechanical), [phone] (1-2 muscles), [phone] (3+ muscles)- Dry Needling, Patient/Family education, Balance training, Stair training, Taping, Joint mobilization, Spinal mobilization, Cryotherapy, and Moist heat.  PLAN FOR NEXT SESSION: assess how MD visit went  Jon Or PTA 12/21/23 9:18 AM

## 2023-12-22 ENCOUNTER — Ambulatory Visit: Admitting: Physical Therapy

## 2023-12-25 ENCOUNTER — Encounter: Payer: Self-pay | Admitting: Radiology

## 2023-12-27 ENCOUNTER — Other Ambulatory Visit (INDEPENDENT_AMBULATORY_CARE_PROVIDER_SITE_OTHER): Payer: Self-pay

## 2023-12-27 ENCOUNTER — Encounter: Payer: Self-pay | Admitting: Physician Assistant

## 2023-12-27 ENCOUNTER — Ambulatory Visit (INDEPENDENT_AMBULATORY_CARE_PROVIDER_SITE_OTHER): Admitting: Physician Assistant

## 2023-12-27 DIAGNOSIS — M2142 Flat foot [pes planus] (acquired), left foot: Secondary | ICD-10-CM | POA: Diagnosis not present

## 2023-12-27 DIAGNOSIS — M1712 Unilateral primary osteoarthritis, left knee: Secondary | ICD-10-CM | POA: Diagnosis not present

## 2023-12-27 DIAGNOSIS — M2141 Flat foot [pes planus] (acquired), right foot: Secondary | ICD-10-CM

## 2023-12-27 DIAGNOSIS — M7062 Trochanteric bursitis, left hip: Secondary | ICD-10-CM | POA: Diagnosis not present

## 2023-12-27 DIAGNOSIS — M25562 Pain in left knee: Secondary | ICD-10-CM | POA: Diagnosis not present

## 2023-12-27 DIAGNOSIS — R269 Unspecified abnormalities of gait and mobility: Secondary | ICD-10-CM

## 2023-12-27 MED ORDER — LIDOCAINE HCL 1 % IJ SOLN
3.0000 mL | INTRAMUSCULAR | Status: AC | PRN
  Administered 2023-12-27: 3 mL

## 2023-12-27 MED ORDER — METHYLPREDNISOLONE ACETATE 40 MG/ML IJ SUSP
40.0000 mg | INTRAMUSCULAR | Status: AC | PRN
  Administered 2023-12-27: 40 mg via INTRA_ARTICULAR

## 2023-12-27 NOTE — Progress Notes (Addendum)
 HPI: Margaret Reese                                                                        1`returns today due to low back pain.  Also due to left knee pain.  She has a history of right total hip arthroplasty 2016.  She states that in August she started using a lift in her left shoe due to the fact she was told she had a leg length discrepancy.  This past June she started using a cane to ambulate.  She states that she is having weakness in her left leg when standing or walking for too long.  No numbness tingling.  She also is having left knee pain and feels the patella is sliding out laterally.  Feels her gait is off.  She has pain on the lateral aspect of her left hip.  No new injuries.  She has been going to formal physical therapy.  She brings in a note today that states that she has had slight regression after 2 weeks of vacation.  Increased knee pain.  She has tried ice hyperkinesis taping which has helped.  They feel that she is fearful of falling feeling that she would benefit from further strengthening her VMO and working on gait balance.  Review of systems: Denies any fevers chills.  Denies any ongoing infections.  Nondiabetic.  Physical exam: General well-developed well-nourished slightly anxious female in no acute distress walks with antalgic gait uses a cane. Bilateral hips: Good range of motion of both hips without pain.  Tenderness at left greater and right trochanteric region.  Leg lengths are equal on exam. Bilateral knees: Good range of motion of both knees.  No instability valgus varus stressing of either knee.  Patellofemoral crepitus bilaterally.  No abnormal warmth erythema or effusion of either knee.  Peripatellar tenderness left knee only. Lower extremities: 5 out of 5 strength throughout lower extremities against resistance.  Negative straight leg raise bilaterally.  Pes planus bilaterally.  Radiographs: Left knee  2 views: Knee is well located.  Slight lateral tracking of the patella.  No patella B[place] or alta.  No acute fractures acute findings.  Moderate narrowing medial joint line.  Moderate patellofemoral arthritic changes.  Lateral compartment overall preserved.   Impression: Left hip trochanteric bursitis Left knee osteoarthritis Quad weakness  Plan: She will discontinue the lift in her left shoe.  Recommend that she get orthotics for her shoes for arch support and she is reminded this is for both feet.  Will send her to therapy for gait balance training IT band stretching and quad strengthening particularly her VMO.  She is shown quad strengthening exercises exercises in regards to knee friendly exercises are discussed with the patient at length.  Will see her back in 2 months see how she is doing overall.  She tolerated the injections well today.    Procedure Note  Patient: Margaret Reese             Date of Birth: September 25, 1945           MRN: [id number]             Visit Date: 12/27/2023  Procedures: Visit Diagnoses:  1. Acute pain  of left knee     Large Joint Inj: L knee on 12/27/2023 10:26 AM Indications: pain Details: 22 G 1.5 in needle, anterolateral approach  Arthrogram: No  Medications: 3 mL lidocaine  1 %; 40 mg methylPREDNISolone  acetate 40 MG/ML Outcome: tolerated well, no immediate complications Procedure, treatment alternatives, risks and benefits explained, specific risks discussed. Consent was given by the patient. Immediately prior to procedure a time out was called to verify the correct patient, procedure, equipment, support staff and site/side marked as required. Patient was prepped and draped in the usual sterile fashion.    Large Joint Inj: L greater trochanter on 12/27/2023 10:26 AM Indications: pain Details: 22 G 1.5 in needle, lateral approach  Arthrogram: No  Medications: 3 mL lidocaine  1 %; 40 mg methylPREDNISolone  acetate 40 MG/ML Outcome: tolerated well, no  immediate complications Procedure, treatment alternatives, risks and benefits explained, specific risks discussed. Consent was given by the patient. Immediately prior to procedure a time out was called to verify the correct patient, procedure, equipment, support staff and site/side marked as required. Patient was prepped and draped in the usual sterile fashion.

## 2023-12-27 NOTE — Addendum Note (Signed)
 Addended by: PETER FRIEZE B on: 12/27/2023 10:36 AM   Modules accepted: Orders

## 2023-12-29 ENCOUNTER — Ambulatory Visit: Attending: Family Medicine | Admitting: Physical Therapy

## 2023-12-29 DIAGNOSIS — M25551 Pain in right hip: Secondary | ICD-10-CM | POA: Insufficient documentation

## 2023-12-29 DIAGNOSIS — M6281 Muscle weakness (generalized): Secondary | ICD-10-CM | POA: Insufficient documentation

## 2023-12-29 DIAGNOSIS — R269 Unspecified abnormalities of gait and mobility: Secondary | ICD-10-CM | POA: Diagnosis not present

## 2023-12-29 DIAGNOSIS — M7062 Trochanteric bursitis, left hip: Secondary | ICD-10-CM | POA: Diagnosis not present

## 2023-12-29 DIAGNOSIS — M25552 Pain in left hip: Secondary | ICD-10-CM | POA: Diagnosis not present

## 2023-12-29 DIAGNOSIS — M2142 Flat foot [pes planus] (acquired), left foot: Secondary | ICD-10-CM | POA: Insufficient documentation

## 2023-12-29 DIAGNOSIS — M1712 Unilateral primary osteoarthritis, left knee: Secondary | ICD-10-CM | POA: Insufficient documentation

## 2023-12-29 DIAGNOSIS — M5417 Radiculopathy, lumbosacral region: Secondary | ICD-10-CM | POA: Diagnosis not present

## 2023-12-29 DIAGNOSIS — M2141 Flat foot [pes planus] (acquired), right foot: Secondary | ICD-10-CM | POA: Diagnosis not present

## 2023-12-29 NOTE — Therapy (Signed)
 OUTPATIENT PHYSICAL THERAPY THORACOLUMBAR TREATMENT     Patient Name: Margaret Reese MRN: [id number] DOB:06-06-1945, 78 y.o., female Today's Date: 12/29/2023  END OF SESSION:  PT End of Session - 12/29/23 0918     Visit Number 22    Date for Recertification  01/12/24    Authorization Type Medicare    PT Start Time 0920    PT Stop Time 1005    PT Time Calculation (min) 45 min            Past Medical History:  Diagnosis Date   Allergy    Anxiety    Arthritis    Asthma    Complication of anesthesia    pt states has difficulty awakening; also has increased sinus drainage   Constipation    Edema, lower extremity    Encounter for interrogation of cardiac pacemaker 09/05/2018   Falls    GERD (gastroesophageal reflux disease)    H/O back injury    History of bronchitis    History of colon polyps    Hypertension    Imbalance    Kidney cysts    pt states not sure which kidney does see kidney specialist yearly pt states every thing okay currently    Legally blind in right eye, as defined in [place]     Mobitz type II atrioventricular block 11/08/2016   Multiple gastric ulcers    Obesity    OSA (obstructive sleep apnea) 05/03/2022   Pacemaker S/P [employer] Jude [employer]urity MRI model [id number] 05/20/2016   Renal stone    Retinal vein occlusion (HCC)    Shortness of breath    Sinus node dysfunction (HCC) 12/16/2018   Tingling    left arm    Trace cataracts    Urinary frequency    Vitamin D  deficiency    Past Surgical History:  Procedure Laterality Date   CARDIOVASCULAR STRESS TEST  dec 2016   CHOLECYSTECTOMY     COLONOSCOPY  2011   EYE SURGERY Bilateral    Cataract surgery.    LEFT HEART CATH AND CORONARY ANGIOGRAPHY N/A 05/19/2016   Procedure: Left Heart Cath and Coronary Angiography;  Surgeon: Salena Negri, MD;  Location: [facility];  Service: Cardiovascular;  Laterality: N/A;   PACEMAKER IMPLANT N/A 05/20/2016   Procedure: Pacemaker Implant;  Surgeon: Will Gladis Norton, MD;  Location: [facility];  Service: Cardiovascular;  Laterality: N/A;   TONSILLECTOMY     TOTAL HIP ARTHROPLASTY Right 01/30/2015   Procedure: RIGHT TOTAL HIP ARTHROPLASTY ANTERIOR APPROACH AND REMOVAL LIPOMA RIGHT HIP;  Surgeon: Lonni CINDERELLA Poli, MD;  Location: [facility];  Service: Orthopedics;  Laterality: Right;   UPPER GI ENDOSCOPY     Patient Active Problem List   Diagnosis Date Noted   Back pain 12/26/2022   Fluid retention in legs 11/13/2022   Stress 06/14/2022   Obesity (HCC)- Start BMI 40.06 05/12/2022   OSA (obstructive sleep apnea) 05/03/2022   Hyperglycemia 03/15/2022   Heart block AV complete (HCC) 01/05/2022   Pulmonary hypertension (HCC) 11/19/2021   Chronic diastolic (congestive) heart failure (HCC) 06/01/2020   Pacemaker S/P St Jude Medical Assurity MRI model [id number] 05/20/2016   History of total right hip arthroplasty 01/30/2015   Arthritis 09/11/2014   Renal cyst 09/04/2014   Allergic rhinitis due to pollen 09/04/2014   Essential hypertension 07/08/2010   GERD (gastroesophageal reflux disease) 07/08/2010   Calcium oxalate renal stones 07/08/2010   Hyperlipidemia 07/08/2010    PCP: Norleen  Joyce  REFERRING PROVIDER: Lonni Poli  REFERRING DIAG:  [phone] (ICD-10-CM) - History of total right hip arthroplasty  M54.41,G89.29 (ICD-10-CM) - Chronic right-sided low back pain with right-sided sciatica    Rationale for Evaluation and Treatment: Rehabilitation  THERAPY DIAG:  Radiculopathy, lumbosacral region  Bilateral hip pain  Muscle weakness (generalized)  ONSET DATE: 08/16/23 referral  SUBJECTIVE:                                                                                                                                                                                           SUBJECTIVE STATEMENT:   I have a lot of questions   PERTINENT HISTORY:  Patient is a 78 year old female who is almost 9 years out from a right  total hip arthroplasty.  She comes in today ambulating with a cane.  She has been having a hard time getting up from a seated position and up from the floor when she is down on the floor.  She reports sciatic pain on the right side and also some left lower extremity and right lower extremity radicular types of symptoms.  She is not a diabetic.  She takes anti-inflammatories over-the-counter on a rare basis.  She denies any change in bowel or bladder function.   On exam both hips move smoothly and fluidly with no blocks or rotation.  Most of her pain seems to be with positive straight leg raise on both sides more so on the right than the left but she does have left lower extremity radicular symptoms as well.  PAIN:  Are you having pain? 5/10 LLE  PRECAUTIONS: None  RED FLAGS: None   WEIGHT BEARING RESTRICTIONS: No  FALLS:  Has patient fallen in last 6 months? No  LIVING ENVIRONMENT: Lives with: lives with their spouse Lives in: House/apartment Stairs: Yes: External: 5 steps; bilateral but cannot reach both Has following equipment at home: Single point cane and Walker - 2 wheeled  PLOF: Independent and Independent with basic ADLs  PATIENT GOALS: to not hurt and I don't want to risk falling  NEXT MD VISIT: 09/20/23  OBJECTIVE:  Note: Objective measures were completed at Evaluation unless otherwise noted.  DIAGNOSTIC FINDINGS:  2 views of the lumbar spine show a significant grade 1 spondylolisthesis at L4-L5 with some degenerative changes as well. An AP pelvis and lateral of the right hip shows a well-seated right total hip arthroplasty. There is moderate arthritis of the left hip.    COGNITION: Overall cognitive status: Within functional limits for tasks assessed     SENSATION: WFL   POSTURE: rounded shoulders and forward head  PALPATION: Some tenderness  in L2-L5  LUMBAR ROM:   AROM eval 09/28/23 10/03/23 10/20/23  Flexion Can touch toes, painful coming back up [facility]  WFLS  Extension 25% with pain 25# with pain Limited 75% Limited 25%  Right lateral flexion Mid thigh with pain Limited 25% Limited 25% Limited 25%  Left lateral flexion Fibular head no pain Limited 25% Limited 25% WFLs  Right rotation 25% [facility] WFL   Left rotation 50% WFL WFL    (Blank rows = not tested)  LOWER EXTREMITY ROM:   grossly within functional ranges   LOWER EXTREMITY MMT:  grossly 5/5, some pain with resisted knee extension on RLE, hip extension 4/5  LUMBAR SPECIAL TESTS:  Straight leg raise test: Positive and FABER test: Positive  FUNCTIONAL TESTS:  5 times sit to stand: 10.39s Timed up and go (TUG): 15.53s w/o cane  GAIT: Distance walked: in clinic distances Assistive device utilized: Single point cane Level of assistance: Complete Independence  TREATMENT DATE:  12/29/23 Answered all pnt questions at length re: MD appt. Helping her to understand everything better and how how entire Lower quadrep works together. Addressed questions re: knee brace and stated if tape helps okay to stick with it Information given about [employer] for orthotics an dincerts Updated HEP Nustep L 5 10 min  12/21/23 Taped left knee for medial pull and educ pt and husband on taping 3# SLR 2 sets 10 3 # SLR with ER 2 sets 10 3# KTC 2 sets 10 3# IR and ER 2 sets 10 Nustep L 5 Resisted gait Stepping actvities  Faom mat for balance  12/15/23 BERG 50/56 Nustep L 5 More Square- balance and stepping activities Vector taps with green tband 10 x BIL Side stepping green tband 10 feet 5 x each Standing on BIL dyna disc func reaching and mini squats 6 inch alt step taps 20 x 2 sets KT tape to left patella to shift medially    12/14/23 Nustep L 5 8 min Step up for ADD LEFT LE 2 sets 10 with HHA with 4  inch ( 6 inch too hard/high) Foam mat side stepping 5 x in // bars Knee ext 10# 2 sets 10 with ball squeeze for VMO HS curl 25# 2 sets 10 with ball squeeze Trunk flex and ext  with resistance 20 x with VMO ball squeeze     12/08/23 Nustep L 5 BERG 50/56 LAQ with ball squeeze 3# 2 sets 10 SAQ with ball squeeze 3# 2 sets 10 SLR with ER 3# 2 sets 10 3# side stepping 10 feet 3 x SL ADD 3# 10 x KT tape for laterally shifted left patella   12/01/23 Nustep L 5 Tandem gait on foam beam 5 x minimal UE support Side stepping on foam mat 5 x each way min UE support More square L 2 stepping fwd and SW STS 10 x from standard chair 10 x BIL LE And trunk PROM with STW to left ITB     11/14/23 BERG 47/56 Nustep L 5 6 inch lateral step across min a with cuing to keep feet fwd 10 x Red tband seated IR/ER 15 x each Red tband side stepping on foam beam no hands CGA 3 x Tandem on beam no hands 2 x CGA Leg Press 30# 3 sets 10 feet 3 ways for hip Navigating various height steps no AD CGA       09/05/23 EVAL  PATIENT EDUCATION:  Education details: POC, HEP, sciatica, hip flexor tightness sleeping in recliners Person educated: Patient Education method: Medical Illustrator Education comprehension: verbalized understanding and returned demonstration  HOME EXERCISE PROGRAM:  12/29/23 adjusted HEP-Access Code: [email or web address] URL: [email or web address] Date: 12/29/2023 Prepared by: Jon Lateefah Mallery  Exercises - Seated Long Arc Quad  - 3 x daily - 7 x weekly - 1 sets - 15 reps - 3-5 hold - Standing Hip Flexion with Resistance at Ankles and Counter Support  - 1-2 x daily - 7 x weekly - 2 sets - 10 reps - Standing Hip Abduction with Resistance at Ankles and Unilateral Counter Support  - 1-2 x daily - 7 x weekly - 2 sets - 10 reps - Standing Hip Extension with Resistance at Ankles and Unilateral Counter Support  - 1-2 x daily - 7 x weekly - 2 sets - 10 reps  Added to HEP. LAQ with ball squeeze, SL hip ADD and  SLR with ER Access Code: [email or web address] URL: [email or web address] Date: 10/03/2023 Prepared by: Kathleen Likins  Exercises - Supine Bridge  - 1 x daily - 7 x weekly - 1 sets - 10 reps - 3 hold - Supine Hip Adduction Isometric with Ball  - 1 x daily - 7 x weekly - 2 sets - 10 reps - Supine March  - 1 x daily - 7 x weekly - 2 sets - 10 reps - Hooklying Clamshell with Resistance  - 1 x daily - 7 x weekly - 2 sets - 10 reps - Supine Straight Leg Raises  - 1 x daily - 7 x weekly - 3 sets - 10 reps - Seated Long Arc Quad  - 1 x daily - 7 x weekly - 2 sets - 10 reps - Seated March  - 1 x daily - 7 x weekly - 2 sets - 10 reps - Seated Hip Flexion and External Rotation  - 1 x daily - 7 x weekly - 2 sets - 10 reps - Standing March with Counter Support  - 1 x daily - 7 x weekly - 1 sets - 10 reps - Standing Hip Abduction with Counter Support  - 1 x daily - 7 x weekly - 1 sets - 10 reps - Standing Hip Extension with Counter Support  - 1 x daily - 7 x weekly - 1 sets - 10 reps Access Code: [email or web address] URL: [email or web address] Date: 09/05/2023 Prepared by: Almetta Fam  Exercises - Supine Lower Trunk Rotation  - 1 x daily - 7 x weekly - 2 sets - 10 reps - Supine Bridge  - 1 x daily - 7 x weekly - 2 sets - 10 reps - Supine Figure 4 Piriformis Stretch  - 1 x daily - 7 x weekly - 2 reps - 15 hold - Modified Thomas Stretch  - 1 x daily - 7 x weekly - 2 reps - 15 hold  ASSESSMENT:  CLINICAL IMPRESSION:  pnt arrives with a lot of questions from MD about so we addressed all issues and concerned as documented above,adjusted HEP and discussed further progression. Pt did state good relief from inj in Left hip and knee OBJECTIVE IMPAIRMENTS: Abnormal gait, decreased activity tolerance, decreased balance, decreased ROM, decreased strength, and pain.     ACTIVITY LIMITATIONS: bending, squatting, stairs, and locomotion level  PARTICIPATION LIMITATIONS: cleaning, laundry, driving,  shopping, community activity, and yard work  PERSONAL FACTORS: Age, Past/current experiences, Time since onset of injury/illness/exacerbation, and 1-2 comorbidities: arthritis, hx of surgery are  also affecting patient's functional outcome.   REHAB POTENTIAL: Good  CLINICAL DECISION MAKING: Stable/uncomplicated  EVALUATION COMPLEXITY: Low  GOALS: Goals reviewed with patient? Yes  SHORT TERM GOALS: Target date: 10/10/23  Patient will be independent with initial HEP.  Baseline:  Goal status: 09/14/23 progressing  09/28/23 MET  2.  Patient will report centralization of radicular symptoms.  Baseline: pain in both legs Goal status: 09/28/23 no changes   10/03/23 progressing   MET 10/20/23   LONG TERM GOALS: Target date: 01/12/24  Patient will be independent with advanced/ongoing HEP to improve outcomes and carryover.  Baseline:  Goal status: MET 10/20/23 btwn HEP and ex class  2.  Patient will report 50-75% improvement in low back pain to improve QOL.  Baseline: 5/10 Goal status: 10/03/23 progressing  10/20/23 40% progressing and 10/24/23 10/31/23 progressing   11/14/23 progressing  12/15/23 progressing 12/21/23 progressing  and 12/29/23  3.  Patient will demonstrate full pain free lumbar ROM to perform ADLs.   Baseline: see chart Goal status: 10/03/23 progressing  and 10/20/23  and 10/24/23. 9/9 WFL except ext limited 50%  12/01/23 WFLs ext limited 75%  12/15/23 WFL minus extension limited 75%  and 12/21/23  4.  Patient will be up and down a set of stairs without pain.  Baseline: pain going up and down Goal status: ongoing 10/03/23  progressing 10/20/23  and 10/24/23  and 11/10/23  and 11/14/23  12/15/23 varies but overall reports much better  5.  Patient will be able to sleep in her bed again for at least 3-4 hrs.  Baseline: 1 hr tolerance  Goal status: 10/03/23 progressing  and 10/20/23. 10/31/23 progressing slowly- hard to get comfortable  progressing 11/10/23  12/01/23 on going  and 12/15/23  6.   Patient will tolerate 30 min of standing to perform ADLs and household chores Baseline: ~10 mins Goal status: 10/03/23 progressing 10/20/23 progressing 15-20  . MET 10/24/23 pt stated stated did 30-45 min  7.  Goal added 11/14/23 Will increase Berg to 52/56 for increased safety and function to reduce risk for falls. Baseline:  11/14/23 47/56  12/01/23 progressing   12/15/23 50/56 progressing  8.  Increase Gait to be able to walk 10 minutes without assistive device.  12/01/23 progressing  and 12/29/23 PLAN:  PT FREQUENCY: 2x/week  PT DURATION: 10 weeks  PLANNED INTERVENTIONS: 97110-Therapeutic exercises, 97530- Therapeutic activity, 97112- Neuromuscular re-education, 97535- Self Care, [id number]- Manual therapy, [phone]- Gait training, [phone]- Electrical stimulation (unattended), [id number]- Vasopneumatic device, [id number]- Traction (mechanical), [phone] (1-2 muscles), [id number] (3+ muscles)- Dry Needling, Patient/Family education, Balance training, Stair training, Taping, Joint mobilization, Spinal mobilization, Cryotherapy, and Moist heat.  PLAN FOR NEXT SESSION: assess adjusted HEP. Assess if pnt got incerts  Jon Kamla Skilton PTA 12/29/23 9:19 AM

## 2024-01-02 ENCOUNTER — Ambulatory Visit: Admitting: Physical Therapy

## 2024-01-02 DIAGNOSIS — M5417 Radiculopathy, lumbosacral region: Secondary | ICD-10-CM

## 2024-01-02 DIAGNOSIS — M6281 Muscle weakness (generalized): Secondary | ICD-10-CM | POA: Diagnosis not present

## 2024-01-02 DIAGNOSIS — M25552 Pain in left hip: Secondary | ICD-10-CM

## 2024-01-02 DIAGNOSIS — M1712 Unilateral primary osteoarthritis, left knee: Secondary | ICD-10-CM | POA: Diagnosis not present

## 2024-01-02 DIAGNOSIS — M7062 Trochanteric bursitis, left hip: Secondary | ICD-10-CM | POA: Diagnosis not present

## 2024-01-02 DIAGNOSIS — M25551 Pain in right hip: Secondary | ICD-10-CM | POA: Diagnosis not present

## 2024-01-02 NOTE — Therapy (Signed)
 OUTPATIENT PHYSICAL THERAPY THORACOLUMBAR TREATMENT     Patient Name: Margaret Reese MRN: [id number] DOB:1945-02-26, 78 y.o., female Today's Date: 01/02/2024  END OF SESSION:  PT End of Session - 01/02/24 1523     Visit Number 23    Date for Recertification  01/12/24    Authorization Type Medicare    PT Start Time 1530    PT Stop Time 1610    PT Time Calculation (min) 40 min            Past Medical History:  Diagnosis Date   Allergy    Anxiety    Arthritis    Asthma    Complication of anesthesia    pt states has difficulty awakening; also has increased sinus drainage   Constipation    Edema, lower extremity    Encounter for interrogation of cardiac pacemaker 09/05/2018   Falls    GERD (gastroesophageal reflux disease)    H/O back injury    History of bronchitis    History of colon polyps    Hypertension    Imbalance    Kidney cysts    pt states not sure which kidney does see kidney specialist yearly pt states every thing okay currently    Legally blind in right eye, as defined in [place]     Mobitz type II atrioventricular block 11/08/2016   Multiple gastric ulcers    Obesity    OSA (obstructive sleep apnea) 05/03/2022   Pacemaker S/P St Jude Medical Assurity MRI model [id number] 05/20/2016   Renal stone    Retinal vein occlusion (HCC)    Shortness of breath    Sinus node dysfunction (HCC) 12/16/2018   Tingling    left arm    Trace cataracts    Urinary frequency    Vitamin D  deficiency    Past Surgical History:  Procedure Laterality Date   CARDIOVASCULAR STRESS TEST  dec 2016   CHOLECYSTECTOMY     COLONOSCOPY  2011   EYE SURGERY Bilateral    Cataract surgery.    LEFT HEART CATH AND CORONARY ANGIOGRAPHY N/A 05/19/2016   Procedure: Left Heart Cath and Coronary Angiography;  Surgeon: Salena Negri, MD;  Location: [facility];  Service: Cardiovascular;  Laterality: N/A;   PACEMAKER IMPLANT N/A 05/20/2016   Procedure: Pacemaker Implant;  Surgeon: Will  Gladis Norton, MD;  Location: [facility];  Service: Cardiovascular;  Laterality: N/A;   TONSILLECTOMY     TOTAL HIP ARTHROPLASTY Right 01/30/2015   Procedure: RIGHT TOTAL HIP ARTHROPLASTY ANTERIOR APPROACH AND REMOVAL LIPOMA RIGHT HIP;  Surgeon: Lonni CINDERELLA Poli, MD;  Location: [facility];  Service: Orthopedics;  Laterality: Right;   UPPER GI ENDOSCOPY     Patient Active Problem List   Diagnosis Date Noted   Back pain 12/26/2022   Fluid retention in legs 11/13/2022   Stress 06/14/2022   Obesity (HCC)- Start BMI 40.06 05/12/2022   OSA (obstructive sleep apnea) 05/03/2022   Hyperglycemia 03/15/2022   Heart block AV complete (HCC) 01/05/2022   Pulmonary hypertension (HCC) 11/19/2021   Chronic diastolic (congestive) heart failure (HCC) 06/01/2020   Pacemaker S/P St Jude Medical Assurity MRI model [id number] 05/20/2016   History of total right hip arthroplasty 01/30/2015   Arthritis 09/11/2014   Renal cyst 09/04/2014   Allergic rhinitis due to pollen 09/04/2014   Essential hypertension 07/08/2010   GERD (gastroesophageal reflux disease) 07/08/2010   Calcium oxalate renal stones 07/08/2010   Hyperlipidemia 07/08/2010    PCP: Norleen  Joyce  REFERRING PROVIDER: Lonni Poli  REFERRING DIAG:  [phone] (ICD-10-CM) - History of total right hip arthroplasty  M54.41,G89.29 (ICD-10-CM) - Chronic right-sided low back pain with right-sided sciatica    Rationale for Evaluation and Treatment: Rehabilitation  THERAPY DIAG:  Radiculopathy, lumbosacral region  Bilateral hip pain  Muscle weakness (generalized)  ONSET DATE: 08/16/23 referral  SUBJECTIVE:                                                                                                                                                                                           SUBJECTIVE STATEMENT:   [facility] and very impressed. Weaning into orthotics and I think they help. Amb in without AD with better gait  pattern.states she is starting back to ex class tomorrow   PERTINENT HISTORY:  Patient is a 78 year old female who is almost 9 years out from a right total hip arthroplasty.  She comes in today ambulating with a cane.  She has been having a hard time getting up from a seated position and up from the floor when she is down on the floor.  She reports sciatic pain on the right side and also some left lower extremity and right lower extremity radicular types of symptoms.  She is not a diabetic.  She takes anti-inflammatories over-the-counter on a rare basis.  She denies any change in bowel or bladder function.   On exam both hips move smoothly and fluidly with no blocks or rotation.  Most of her pain seems to be with positive straight leg raise on both sides more so on the right than the left but she does have left lower extremity radicular symptoms as well.  PAIN:  Are you having pain? 5/10 LLE  PRECAUTIONS: None  RED FLAGS: None   WEIGHT BEARING RESTRICTIONS: No  FALLS:  Has patient fallen in last 6 months? No  LIVING ENVIRONMENT: Lives with: lives with their spouse Lives in: House/apartment Stairs: Yes: External: 5 steps; bilateral but cannot reach both Has following equipment at home: Single point cane and Walker - 2 wheeled  PLOF: Independent and Independent with basic ADLs  PATIENT GOALS: to not hurt and I don't want to risk falling  NEXT MD VISIT: 09/20/23  OBJECTIVE:  Note: Objective measures were completed at Evaluation unless otherwise noted.  DIAGNOSTIC FINDINGS:  2 views of the lumbar spine show a significant grade 1 spondylolisthesis at L4-L5 with some degenerative changes as well. An AP pelvis and lateral of the right hip shows a well-seated right total hip arthroplasty. There is moderate arthritis of the left hip.    COGNITION: Overall cognitive  status: Within functional limits for tasks assessed     SENSATION: WFL   POSTURE: rounded shoulders and forward  head  PALPATION: Some tenderness in L2-L5  LUMBAR ROM:   AROM eval 09/28/23 10/03/23 10/20/23  Flexion Can touch toes, painful coming back up [facility] WFLS  Extension 25% with pain 25# with pain Limited 75% Limited 25%  Right lateral flexion Mid thigh with pain Limited 25% Limited 25% Limited 25%  Left lateral flexion Fibular head no pain Limited 25% Limited 25% WFLs  Right rotation 25% [facility] WFL   Left rotation 50% WFL WFL    (Blank rows = not tested)  LOWER EXTREMITY ROM:   grossly within functional ranges   LOWER EXTREMITY MMT:  grossly 5/5, some pain with resisted knee extension on RLE, hip extension 4/5  LUMBAR SPECIAL TESTS:  Straight leg raise test: Positive and FABER test: Positive  FUNCTIONAL TESTS:  5 times sit to stand: 10.39s Timed up and go (TUG): 15.53s w/o cane  GAIT: Distance walked: in clinic distances Assistive device utilized: Single point cane Level of assistance: Complete Independence  TREATMENT DATE:  01/02/24 Nustep L 5 8 min LE only 20# side stepping over 2 ( 1 inch) sticks 5 x each side CG-min A 3# more square stepping fwd and SW CG-min A 3# HHA marching fwd and backward 25 feet 3# side stepping 10 feet 4 x each Fitter 1 blue standing hip flex,ext and abd 15 x each CGA   12/29/23 Answered all pnt questions at length re: MD appt. Helping her to understand everything better and how how entire Lower quadrep works together. Addressed questions re: knee brace and stated if tape helps okay to stick with it Information given about [employer] [facility] for orthotics an dincerts Updated HEP Nustep L 5 10 min  12/21/23 Taped left knee for medial pull and educ pt and husband on taping 3# SLR 2 sets 10 3 # SLR with ER 2 sets 10 3# KTC 2 sets 10 3# IR and ER 2 sets 10 Nustep L 5 Resisted gait Stepping actvities  Faom mat for balance  12/15/23 BERG 50/56 Nustep L 5 More Square- balance and stepping activities Vector taps with green tband 10 x  BIL Side stepping green tband 10 feet 5 x each Standing on BIL dyna disc func reaching and mini squats 6 inch alt step taps 20 x 2 sets KT tape to left patella to shift medially    12/14/23 Nustep L 5 8 min Step up for ADD LEFT LE 2 sets 10 with HHA with 4  inch ( 6 inch too hard/high) Foam mat side stepping 5 x in // bars Knee ext 10# 2 sets 10 with ball squeeze for VMO HS curl 25# 2 sets 10 with ball squeeze Trunk flex and ext with resistance 20 x with VMO ball squeeze     12/08/23 Nustep L 5 BERG 50/56 LAQ with ball squeeze 3# 2 sets 10 SAQ with ball squeeze 3# 2 sets 10 SLR with ER 3# 2 sets 10 3# side stepping 10 feet 3 x SL ADD 3# 10 x KT tape for laterally shifted left patella   12/01/23 Nustep L 5 Tandem gait on foam beam 5 x minimal UE support Side stepping on foam mat 5 x each way min UE support More square L 2 stepping fwd and SW STS 10 x from standard chair 10 x BIL LE And trunk PROM with STW to left ITB  11/14/23 BERG 47/56 Nustep L 5 6 inch lateral step across min a with cuing to keep feet fwd 10 x Red tband seated IR/ER 15 x each Red tband side stepping on foam beam no hands CGA 3 x Tandem on beam no hands 2 x CGA Leg Press 30# 3 sets 10 feet 3 ways for hip Navigating various height steps no AD CGA       09/05/23 EVAL                                                                                                                                PATIENT EDUCATION:  Education details: POC, HEP, sciatica, hip flexor tightness sleeping in recliners Person educated: Patient Education method: Medical Illustrator Education comprehension: verbalized understanding and returned demonstration  HOME EXERCISE PROGRAM:  12/29/23 adjusted HEP-Access Code: [email or web address] URL: [email or web address] Date: 12/29/2023 Prepared by: Jon Bolivar Koranda  Exercises - Seated Long Arc Quad  - 3 x daily - 7 x weekly - 1 sets - 15  reps - 3-5 hold - Standing Hip Flexion with Resistance at Ankles and Counter Support  - 1-2 x daily - 7 x weekly - 2 sets - 10 reps - Standing Hip Abduction with Resistance at Ankles and Unilateral Counter Support  - 1-2 x daily - 7 x weekly - 2 sets - 10 reps - Standing Hip Extension with Resistance at Ankles and Unilateral Counter Support  - 1-2 x daily - 7 x weekly - 2 sets - 10 reps  Added to HEP. LAQ with ball squeeze, SL hip ADD and SLR with ER Access Code: [email or web address] URL: [email or web address] Date: 10/03/2023 Prepared by: Maelynn Moroney  Exercises - Supine Bridge  - 1 x daily - 7 x weekly - 1 sets - 10 reps - 3 hold - Supine Hip Adduction Isometric with Ball  - 1 x daily - 7 x weekly - 2 sets - 10 reps - Supine March  - 1 x daily - 7 x weekly - 2 sets - 10 reps - Hooklying Clamshell with Resistance  - 1 x daily - 7 x weekly - 2 sets - 10 reps - Supine Straight Leg Raises  - 1 x daily - 7 x weekly - 3 sets - 10 reps - Seated Long Arc Quad  - 1 x daily - 7 x weekly - 2 sets - 10 reps - Seated March  - 1 x daily - 7 x weekly - 2 sets - 10 reps - Seated Hip Flexion and External Rotation  - 1 x daily - 7 x weekly - 2 sets - 10 reps - Standing March with Counter Support  - 1 x daily - 7 x weekly - 1 sets - 10 reps - Standing Hip Abduction with Counter Support  - 1 x daily - 7 x weekly - 1 sets - 10 reps - Standing Hip Extension with Counter Support  - 1 x daily -  7 x weekly - 1 sets - 10 reps Access Code: [email or web address] URL: [email or web address] Date: 09/05/2023 Prepared by: Almetta Fam  Exercises - Supine Lower Trunk Rotation  - 1 x daily - 7 x weekly - 2 sets - 10 reps - Supine Bridge  - 1 x daily - 7 x weekly - 2 sets - 10 reps - Supine Figure 4 Piriformis Stretch  - 1 x daily - 7 x weekly - 2 reps - 15 hold - Modified Thomas Stretch  - 1 x daily - 7 x weekly - 2 reps - 15 hold  ASSESSMENT:  CLINICAL IMPRESSION:  went to [facility] and very impressed.  Weaning into orthotics and I think they help. Amb in without AD with better gait pattern. Progressed ex for strength and balance, cg- min a needed    OBJECTIVE IMPAIRMENTS: Abnormal gait, decreased activity tolerance, decreased balance, decreased ROM, decreased strength, and pain.     ACTIVITY LIMITATIONS: bending, squatting, stairs, and locomotion level  PARTICIPATION LIMITATIONS: cleaning, laundry, driving, shopping, community activity, and yard work  PERSONAL FACTORS: Age, Past/current experiences, Time since onset of injury/illness/exacerbation, and 1-2 comorbidities: arthritis, hx of surgery are also affecting patient's functional outcome.   REHAB POTENTIAL: Good  CLINICAL DECISION MAKING: Stable/uncomplicated  EVALUATION COMPLEXITY: Low  GOALS: Goals reviewed with patient? Yes  SHORT TERM GOALS: Target date: 10/10/23  Patient will be independent with initial HEP.  Baseline:  Goal status: 09/14/23 progressing  09/28/23 MET  2.  Patient will report centralization of radicular symptoms.  Baseline: pain in both legs Goal status: 09/28/23 no changes   10/03/23 progressing   MET 10/20/23   LONG TERM GOALS: Target date: 01/12/24  Patient will be independent with advanced/ongoing HEP to improve outcomes and carryover.  Baseline:  Goal status: MET 10/20/23 btwn HEP and ex class  2.  Patient will report 50-75% improvement in low back pain to improve QOL.  Baseline: 5/10 Goal status: 10/03/23 progressing  10/20/23 40% progressing and 10/24/23 10/31/23 progressing   11/14/23 progressing  12/15/23 progressing 12/21/23 progressing  and 12/29/23  3.  Patient will demonstrate full pain free lumbar ROM to perform ADLs.   Baseline: see chart Goal status: 10/03/23 progressing  and 10/20/23  and 10/24/23. 9/9 WFL except ext limited 50%  12/01/23 WFLs ext limited 75%  12/15/23 WFL minus extension limited 75%  and 12/21/23  4.  Patient will be up and down a set of stairs without pain.  Baseline: pain  going up and down Goal status: ongoing 10/03/23  progressing 10/20/23  and 10/24/23  and 11/10/23  and 11/14/23  12/15/23 varies but overall reports much better  5.  Patient will be able to sleep in her bed again for at least 3-4 hrs.  Baseline: 1 hr tolerance  Goal status: 10/03/23 progressing  and 10/20/23. 10/31/23 progressing slowly- hard to get comfortable  progressing 11/10/23  12/01/23 on going  and 12/15/23  6.  Patient will tolerate 30 min of standing to perform ADLs and household chores Baseline: ~10 mins Goal status: 10/03/23 progressing 10/20/23 progressing 15-20  . MET 10/24/23 pt stated stated did 30-45 min  7.  Goal added 11/14/23 Will increase Berg to 52/56 for increased safety and function to reduce risk for falls. Baseline:  11/14/23 47/56  12/01/23 progressing   12/15/23 50/56 progressing  8.  Increase Gait to be able to walk 10 minutes without assistive device.  12/01/23 progressing  and 12/29/23 PLAN:  PT FREQUENCY: 2x/week  PT DURATION:  10 weeks  PLANNED INTERVENTIONS: 97110-Therapeutic exercises, 97530- Therapeutic activity, [id number]- Neuromuscular re-education, 97535- Self Care, [id number]- Manual therapy, [phone]- Gait training, [phone]- Electrical stimulation (unattended), [id number]- Vasopneumatic device, [id number]- Traction (mechanical), [phone] (1-2 muscles), [id number] (3+ muscles)- Dry Needling, Patient/Family education, Balance training, Stair training, Taping, Joint mobilization, Spinal mobilization, Cryotherapy, and Moist heat.  PLAN FOR NEXT SESSION    progress func strength,gait and balance  Jon Tiaja Hagan PTA 01/02/24 4:05 PM

## 2024-01-04 ENCOUNTER — Ambulatory Visit: Admitting: Physical Therapy

## 2024-01-04 DIAGNOSIS — M5417 Radiculopathy, lumbosacral region: Secondary | ICD-10-CM

## 2024-01-04 DIAGNOSIS — M25551 Pain in right hip: Secondary | ICD-10-CM

## 2024-01-04 DIAGNOSIS — M25552 Pain in left hip: Secondary | ICD-10-CM | POA: Diagnosis not present

## 2024-01-04 DIAGNOSIS — M1712 Unilateral primary osteoarthritis, left knee: Secondary | ICD-10-CM | POA: Diagnosis not present

## 2024-01-04 DIAGNOSIS — M6281 Muscle weakness (generalized): Secondary | ICD-10-CM

## 2024-01-04 DIAGNOSIS — M7062 Trochanteric bursitis, left hip: Secondary | ICD-10-CM | POA: Diagnosis not present

## 2024-01-04 NOTE — Therapy (Signed)
 OUTPATIENT PHYSICAL THERAPY THORACOLUMBAR TREATMENT     Patient Name: Margaret Reese MRN: [id number] DOB:1945/05/28, 78 y.o., female Today's Date: 01/04/2024  END OF SESSION:  PT End of Session - 01/04/24 0911     Visit Number 24    Date for Recertification  01/12/24    Authorization Type Medicare    PT Start Time 0915    PT Stop Time 1000    PT Time Calculation (min) 45 min            Past Medical History:  Diagnosis Date   Allergy    Anxiety    Arthritis    Asthma    Complication of anesthesia    pt states has difficulty awakening; also has increased sinus drainage   Constipation    Edema, lower extremity    Encounter for interrogation of cardiac pacemaker 09/05/2018   Falls    GERD (gastroesophageal reflux disease)    H/O back injury    History of bronchitis    History of colon polyps    Hypertension    Imbalance    Kidney cysts    pt states not sure which kidney does see kidney specialist yearly pt states every thing okay currently    Legally blind in right eye, as defined in [place]     Mobitz type II atrioventricular block 11/08/2016   Multiple gastric ulcers    Obesity    OSA (obstructive sleep apnea) 05/03/2022   Pacemaker S/P St Jude Medical Assurity MRI model [id number] 05/20/2016   Renal stone    Retinal vein occlusion (HCC)    Shortness of breath    Sinus node dysfunction (HCC) 12/16/2018   Tingling    left arm    Trace cataracts    Urinary frequency    Vitamin D  deficiency    Past Surgical History:  Procedure Laterality Date   CARDIOVASCULAR STRESS TEST  dec 2016   CHOLECYSTECTOMY     COLONOSCOPY  2011   EYE SURGERY Bilateral    Cataract surgery.    LEFT HEART CATH AND CORONARY ANGIOGRAPHY N/A 05/19/2016   Procedure: Left Heart Cath and Coronary Angiography;  Surgeon: Salena Negri, MD;  Location: [facility];  Service: Cardiovascular;  Laterality: N/A;   PACEMAKER IMPLANT N/A 05/20/2016   Procedure: Pacemaker Implant;  Surgeon: Will  Gladis Norton, MD;  Location: [facility];  Service: Cardiovascular;  Laterality: N/A;   TONSILLECTOMY     TOTAL HIP ARTHROPLASTY Right 01/30/2015   Procedure: RIGHT TOTAL HIP ARTHROPLASTY ANTERIOR APPROACH AND REMOVAL LIPOMA RIGHT HIP;  Surgeon: Lonni CINDERELLA Poli, MD;  Location: [facility];  Service: Orthopedics;  Laterality: Right;   UPPER GI ENDOSCOPY     Patient Active Problem List   Diagnosis Date Noted   Back pain 12/26/2022   Fluid retention in legs 11/13/2022   Stress 06/14/2022   Obesity (HCC)- Start BMI 40.06 05/12/2022   OSA (obstructive sleep apnea) 05/03/2022   Hyperglycemia 03/15/2022   Heart block AV complete (HCC) 01/05/2022   Pulmonary hypertension (HCC) 11/19/2021   Chronic diastolic (congestive) heart failure (HCC) 06/01/2020   Pacemaker S/P St Jude Medical Assurity MRI model [id number] 05/20/2016   History of total right hip arthroplasty 01/30/2015   Arthritis 09/11/2014   Renal cyst 09/04/2014   Allergic rhinitis due to pollen 09/04/2014   Essential hypertension 07/08/2010   GERD (gastroesophageal reflux disease) 07/08/2010   Calcium oxalate renal stones 07/08/2010   Hyperlipidemia 07/08/2010    PCP: Norleen  Joyce  REFERRING PROVIDER: Lonni Poli  REFERRING DIAG:  [phone] (ICD-10-CM) - History of total right hip arthroplasty  M54.41,G89.29 (ICD-10-CM) - Chronic right-sided low back pain with right-sided sciatica    Rationale for Evaluation and Treatment: Rehabilitation  THERAPY DIAG:  Radiculopathy, lumbosacral region  Bilateral hip pain  Muscle weakness (generalized)  ONSET DATE: 08/16/23 referral  SUBJECTIVE:                                                                                                                                                                                           SUBJECTIVE STATEMENT:   walking better. Did pretty well in exercise class yesterday. Amb in again without AD  PERTINENT HISTORY:  Patient is  a 78 year old female who is almost 9 years out from a right total hip arthroplasty.  She comes in today ambulating with a cane.  She has been having a hard time getting up from a seated position and up from the floor when she is down on the floor.  She reports sciatic pain on the right side and also some left lower extremity and right lower extremity radicular types of symptoms.  She is not a diabetic.  She takes anti-inflammatories over-the-counter on a rare basis.  She denies any change in bowel or bladder function.   On exam both hips move smoothly and fluidly with no blocks or rotation.  Most of her pain seems to be with positive straight leg raise on both sides more so on the right than the left but she does have left lower extremity radicular symptoms as well.  PAIN:  Are you having pain 3/10 LLE  PRECAUTIONS: None  RED FLAGS: None   WEIGHT BEARING RESTRICTIONS: No  FALLS:  Has patient fallen in last 6 months? No  LIVING ENVIRONMENT: Lives with: lives with their spouse Lives in: House/apartment Stairs: Yes: External: 5 steps; bilateral but cannot reach both Has following equipment at home: Single point cane and Walker - 2 wheeled  PLOF: Independent and Independent with basic ADLs  PATIENT GOALS: to not hurt and I don't want to risk falling  NEXT MD VISIT: 09/20/23  OBJECTIVE:  Note: Objective measures were completed at Evaluation unless otherwise noted.  DIAGNOSTIC FINDINGS:  2 views of the lumbar spine show a significant grade 1 spondylolisthesis at L4-L5 with some degenerative changes as well. An AP pelvis and lateral of the right hip shows a well-seated right total hip arthroplasty. There is moderate arthritis of the left hip.    COGNITION: Overall cognitive status: Within functional limits for tasks assessed     SENSATION: WFL   POSTURE: rounded shoulders  and forward head  PALPATION: Some tenderness in L2-L5  LUMBAR ROM:   AROM eval 09/28/23 10/03/23 10/20/23   Flexion Can touch toes, painful coming back up [facility] WFLS  Extension 25% with pain 25# with pain Limited 75% Limited 25%  Right lateral flexion Mid thigh with pain Limited 25% Limited 25% Limited 25%  Left lateral flexion Fibular head no pain Limited 25% Limited 25% WFLs  Right rotation 25% [facility] WFL   Left rotation 50% WFL WFL    (Blank rows = not tested)  LOWER EXTREMITY ROM:   grossly within functional ranges   LOWER EXTREMITY MMT:  grossly 5/5, some pain with resisted knee extension on RLE, hip extension 4/5  LUMBAR SPECIAL TESTS:  Straight leg raise test: Positive and FABER test: Positive  FUNCTIONAL TESTS:  5 times sit to stand: 10.39s Timed up and go (TUG): 15.53s w/o cane  GAIT: Distance walked: in clinic distances Assistive device utilized: Single point cane Level of assistance: Complete Independence  TREATMENT DATE:  01/04/24 Nustep L 5 10 min LE only 30# resisted gait 5 x fwd and back, then 3 x each side Sit to stand on airex 10 x CGA- no LOB Vector taps on airex CGA, only 1 LOB that required assist Obstacles course stepping on/over and around- CGA with cuing to get closer to obj and step over vs swing Fitter 1 blue standing hip flex,ext and abd 15 x each CGA Leg press 30# 2 sets10 Calf raises 30# 2 sets 10   01/02/24 Nustep L 5 8 min LE only 20# side stepping over 2 ( 1 inch) sticks 5 x each side CG-min A 3# more square stepping fwd and SW CG-min A 3# HHA marching fwd and backward 25 feet 3# side stepping 10 feet 4 x each Fitter 1 blue standing hip flex,ext and abd 15 x each CGA   12/29/23 Answered all pnt questions at length re: MD appt. Helping her to understand everything better and how how entire Lower quadrep works together. Addressed questions re: knee brace and stated if tape helps okay to stick with it Information given about [employer] for orthotics an dincerts Updated HEP Nustep L 5 10 min  12/21/23 Taped left knee for medial pull and  educ pt and husband on taping 3# SLR 2 sets 10 3 # SLR with ER 2 sets 10 3# KTC 2 sets 10 3# IR and ER 2 sets 10 Nustep L 5 Resisted gait Stepping actvities  Faom mat for balance  12/15/23 BERG 50/56 Nustep L 5 More Square- balance and stepping activities Vector taps with green tband 10 x BIL Side stepping green tband 10 feet 5 x each Standing on BIL dyna disc func reaching and mini squats 6 inch alt step taps 20 x 2 sets KT tape to left patella to shift medially    12/14/23 Nustep L 5 8 min Step up for ADD LEFT LE 2 sets 10 with HHA with 4  inch ( 6 inch too hard/high) Foam mat side stepping 5 x in // bars Knee ext 10# 2 sets 10 with ball squeeze for VMO HS curl 25# 2 sets 10 with ball squeeze Trunk flex and ext with resistance 20 x with VMO ball squeeze     12/08/23 Nustep L 5 BERG 50/56 LAQ with ball squeeze 3# 2 sets 10 SAQ with ball squeeze 3# 2 sets 10 SLR with ER 3# 2 sets 10 3# side stepping 10 feet 3  x SL ADD 3# 10 x KT tape for laterally shifted left patella   12/01/23 Nustep L 5 Tandem gait on foam beam 5 x minimal UE support Side stepping on foam mat 5 x each way min UE support More square L 2 stepping fwd and SW STS 10 x from standard chair 10 x BIL LE And trunk PROM with STW to left ITB     11/14/23 BERG 47/56 Nustep L 5 6 inch lateral step across min a with cuing to keep feet fwd 10 x Red tband seated IR/ER 15 x each Red tband side stepping on foam beam no hands CGA 3 x Tandem on beam no hands 2 x CGA Leg Press 30# 3 sets 10 feet 3 ways for hip Navigating various height steps no AD CGA       09/05/23 EVAL                                                                                                                                PATIENT EDUCATION:  Education details: POC, HEP, sciatica, hip flexor tightness sleeping in recliners Person educated: Patient Education method: Software Engineer Education comprehension: verbalized understanding and returned demonstration  HOME EXERCISE PROGRAM:  12/29/23 adjusted HEP-Access Code: [email or web address] URL: [email or web address] Date: 12/29/2023 Prepared by: Jon Allye Hoyos  Exercises - Seated Long Arc Quad  - 3 x daily - 7 x weekly - 1 sets - 15 reps - 3-5 hold - Standing Hip Flexion with Resistance at Ankles and Counter Support  - 1-2 x daily - 7 x weekly - 2 sets - 10 reps - Standing Hip Abduction with Resistance at Ankles and Unilateral Counter Support  - 1-2 x daily - 7 x weekly - 2 sets - 10 reps - Standing Hip Extension with Resistance at Ankles and Unilateral Counter Support  - 1-2 x daily - 7 x weekly - 2 sets - 10 reps  Added to HEP. LAQ with ball squeeze, SL hip ADD and SLR with ER Access Code: [email or web address] URL: [email or web address] Date: 10/03/2023 Prepared by: Jennings Stirling  Exercises - Supine Bridge  - 1 x daily - 7 x weekly - 1 sets - 10 reps - 3 hold - Supine Hip Adduction Isometric with Ball  - 1 x daily - 7 x weekly - 2 sets - 10 reps - Supine March  - 1 x daily - 7 x weekly - 2 sets - 10 reps - Hooklying Clamshell with Resistance  - 1 x daily - 7 x weekly - 2 sets - 10 reps - Supine Straight Leg Raises  - 1 x daily - 7 x weekly - 3 sets - 10 reps - Seated Long Arc Quad  - 1 x daily - 7 x weekly - 2 sets - 10 reps - Seated March  - 1 x daily - 7 x weekly - 2 sets - 10 reps - Seated Hip Flexion and External  Rotation  - 1 x daily - 7 x weekly - 2 sets - 10 reps - Standing March with Counter Support  - 1 x daily - 7 x weekly - 1 sets - 10 reps - Standing Hip Abduction with Counter Support  - 1 x daily - 7 x weekly - 1 sets - 10 reps - Standing Hip Extension with Counter Support  - 1 x daily - 7 x weekly - 1 sets - 10 reps Access Code: [email or web address] URL: [email or web address] Date: 09/05/2023 Prepared by: Almetta Fam  Exercises - Supine Lower Trunk Rotation  - 1 x daily -  7 x weekly - 2 sets - 10 reps - Supine Bridge  - 1 x daily - 7 x weekly - 2 sets - 10 reps - Supine Figure 4 Piriformis Stretch  - 1 x daily - 7 x weekly - 2 reps - 15 hold - Modified Thomas Stretch  - 1 x daily - 7 x weekly - 2 reps - 15 hold  ASSESSMENT:  CLINICAL IMPRESSION: pnt very pleased with pain decrease since inj. Increasing time in orthotics and feel they are helping. Much better balance and strength today with all interventions. Goals assessed and documented. Overall pt feels its been a good week.  OBJECTIVE IMPAIRMENTS: Abnormal gait, decreased activity tolerance, decreased balance, decreased ROM, decreased strength, and pain.     ACTIVITY LIMITATIONS: bending, squatting, stairs, and locomotion level  PARTICIPATION LIMITATIONS: cleaning, laundry, driving, shopping, community activity, and yard work  PERSONAL FACTORS: Age, Past/current experiences, Time since onset of injury/illness/exacerbation, and 1-2 comorbidities: arthritis, hx of surgery are also affecting patient's functional outcome.   REHAB POTENTIAL: Good  CLINICAL DECISION MAKING: Stable/uncomplicated  EVALUATION COMPLEXITY: Low  GOALS: Goals reviewed with patient? Yes  SHORT TERM GOALS: Target date: 10/10/23  Patient will be independent with initial HEP.  Baseline:  Goal status: 09/14/23 progressing  09/28/23 MET  2.  Patient will report centralization of radicular symptoms.  Baseline: pain in both legs Goal status: 09/28/23 no changes   10/03/23 progressing   MET 10/20/23   LONG TERM GOALS: Target date: 01/12/24  Patient will be independent with advanced/ongoing HEP to improve outcomes and carryover.  Baseline:  Goal status: MET 10/20/23 btwn HEP and ex class  2.  Patient will report 50-75% improvement in low back pain to improve QOL.  Baseline: 5/10 Goal status: 10/03/23 progressing  10/20/23 40% progressing and 10/24/23 10/31/23 progressing   11/14/23 progressing  12/15/23 progressing 12/21/23 progressing   and 12/29/23  3.  Patient will demonstrate full pain free lumbar ROM to perform ADLs.   Baseline: see chart Goal status: 10/03/23 progressing  and 10/20/23  and 10/24/23. 9/9 WFL except ext limited 50%  12/01/23 WFLs ext limited 75%  12/15/23 WFL minus extension limited 75%  and 12/21/23  4.  Patient will be up and down a set of stairs without pain.  Baseline: pain going up and down Goal status: ongoing 10/03/23  progressing 10/20/23  and 10/24/23  and 11/10/23  and 11/14/23  12/15/23 varies but overall reports much better  01/04/24 MET  5.  Patient will be able to sleep in her bed again for at least 3-4 hrs.  Baseline: 1 hr tolerance  Goal status: 10/03/23 progressing  and 10/20/23. 10/31/23 progressing slowly- hard to get comfortable  progressing 11/10/23  12/01/23 on going  and 12/15/23  01/04/24 still an ongoing issue- mostly back  6.  Patient will tolerate 30 min of standing to perform ADLs  and household chores Baseline: ~10 mins Goal status: 10/03/23 progressing 10/20/23 progressing 15-20  . MET 10/24/23 pt stated stated did 30-45 min  7.  Goal added 11/14/23 Will increase Berg to 52/56 for increased safety and function to reduce risk for falls. Baseline:  11/14/23 47/56  12/01/23 progressing   12/15/23 50/56 progressing  and 01/04/24  8.  Increase Gait to be able to walk 10 minutes without assistive device.  12/01/23 progressing  and 12/29/23 PLAN:  PT FREQUENCY: 2x/week  PT DURATION: 10 weeks  PLANNED INTERVENTIONS: 97110-Therapeutic exercises, 97530- Therapeutic activity, 97112- Neuromuscular re-education, 97535- Self Care, [id number]- Manual therapy, [phone]- Gait training, [phone]- Electrical stimulation (unattended), 97016- Vasopneumatic device, [id number]- Traction (mechanical), [phone] (1-2 muscles), [phone] (3+ muscles)- Dry Needling, Patient/Family education, Balance training, Stair training, Taping, Joint mobilization, Spinal mobilization, Cryotherapy, and Moist heat.  PLAN FOR NEXT SESSION    progress func  strength,gait and balance  Jon Shardae Kleinman PTA 01/04/24 9:11 AM

## 2024-01-09 ENCOUNTER — Ambulatory Visit: Admitting: Physical Therapy

## 2024-01-09 DIAGNOSIS — M25551 Pain in right hip: Secondary | ICD-10-CM | POA: Diagnosis not present

## 2024-01-09 DIAGNOSIS — M5417 Radiculopathy, lumbosacral region: Secondary | ICD-10-CM | POA: Diagnosis not present

## 2024-01-09 DIAGNOSIS — M6281 Muscle weakness (generalized): Secondary | ICD-10-CM | POA: Diagnosis not present

## 2024-01-09 DIAGNOSIS — M1712 Unilateral primary osteoarthritis, left knee: Secondary | ICD-10-CM | POA: Diagnosis not present

## 2024-01-09 DIAGNOSIS — M7062 Trochanteric bursitis, left hip: Secondary | ICD-10-CM | POA: Diagnosis not present

## 2024-01-09 DIAGNOSIS — M25552 Pain in left hip: Secondary | ICD-10-CM | POA: Diagnosis not present

## 2024-01-09 NOTE — Therapy (Signed)
 OUTPATIENT PHYSICAL THERAPY THORACOLUMBAR TREATMENT     Patient Name: Margaret Reese MRN: [id number] DOB:[birth year], 78 y.o., female Today's Date: 01/09/2024  END OF SESSION:  PT End of Session - 01/09/24 1141     Visit Number 25    Date for Recertification  01/12/24    PT Start Time 1140    PT Stop Time 1225    PT Time Calculation (min) 45 min            Past Medical History:  Diagnosis Date   Allergy    Anxiety    Arthritis    Asthma    Complication of anesthesia    pt states has difficulty awakening; also has increased sinus drainage   Constipation    Edema, lower extremity    Encounter for interrogation of cardiac pacemaker 09/05/2018   Falls    GERD (gastroesophageal reflux disease)    H/O back injury    History of bronchitis    History of colon polyps    Hypertension    Imbalance    Kidney cysts    pt states not sure which kidney does see kidney specialist yearly pt states every thing okay currently    Legally blind in right eye, as defined in [place]     Mobitz type II atrioventricular block 11/08/2016   Multiple gastric ulcers    Obesity    OSA (obstructive sleep apnea) 05/03/2022   Pacemaker S/P St Jude [employer]urity MRI model [id number] 05/20/2016   Renal stone    Retinal vein occlusion (HCC)    Shortness of breath    Sinus node dysfunction (HCC) 12/16/2018   Tingling    left arm    Trace cataracts    Urinary frequency    Vitamin D  deficiency    Past Surgical History:  Procedure Laterality Date   CARDIOVASCULAR STRESS TEST  dec 2016   CHOLECYSTECTOMY     COLONOSCOPY  2011   EYE SURGERY Bilateral    Cataract surgery.    LEFT HEART CATH AND CORONARY ANGIOGRAPHY N/A 05/19/2016   Procedure: Left Heart Cath and Coronary Angiography;  Surgeon: Salena Negri, MD;  Location: [facility];  Service: Cardiovascular;  Laterality: N/A;   PACEMAKER IMPLANT N/A 05/20/2016   Procedure: Pacemaker Implant;  Surgeon: Will Gladis Norton, MD;  Location: [facility];  Service: Cardiovascular;  Laterality: N/A;   TONSILLECTOMY     TOTAL HIP ARTHROPLASTY Right 01/30/2015   Procedure: RIGHT TOTAL HIP ARTHROPLASTY ANTERIOR APPROACH AND REMOVAL LIPOMA RIGHT HIP;  Surgeon: Lonni CINDERELLA Poli, MD;  Location: [facility];  Service: Orthopedics;  Laterality: Right;   UPPER GI ENDOSCOPY     Patient Active Problem List   Diagnosis Date Noted   Back pain 12/26/2022   Fluid retention in legs 11/13/2022   Stress 06/14/2022   Obesity (HCC)- Start BMI 40.06 05/12/2022   OSA (obstructive sleep apnea) 05/03/2022   Hyperglycemia 03/15/2022   Heart block AV complete (HCC) 01/05/2022   Pulmonary hypertension (HCC) 11/19/2021   Chronic diastolic (congestive) heart failure (HCC) 06/01/2020   Pacemaker S/P St Jude Medical Assurity MRI model [id number] 05/20/2016   History of total right hip arthroplasty 01/30/2015   Arthritis 09/11/2014   Renal cyst 09/04/2014   Allergic rhinitis due to pollen 09/04/2014   Essential hypertension 07/08/2010   GERD (gastroesophageal reflux disease) 07/08/2010   Calcium oxalate renal stones 07/08/2010   Hyperlipidemia 07/08/2010    PCP: Norleen Jobs  REFERRING PROVIDER: Lonni Poli  REFERRING DIAG:  S03.358 (ICD-10-CM) - History of total right hip arthroplasty  M54.41,G89.29 (ICD-10-CM) - Chronic right-sided low back pain with right-sided sciatica    Rationale for Evaluation and Treatment: Rehabilitation  THERAPY DIAG:  Radiculopathy, lumbosacral region  Bilateral hip pain  Muscle weakness (generalized)  ONSET DATE: 08/16/23 referral  SUBJECTIVE:                                                                                                                                                                                           SUBJECTIVE STATEMENT:   doing better, doing more with less pain. Some back pain PERTINENT HISTORY:  Patient is a 78 year old female who is almost 9 years out from a right total  hip arthroplasty.  She comes in today ambulating with a cane.  She has been having a hard time getting up from a seated position and up from the floor when she is down on the floor.  She reports sciatic pain on the right side and also some left lower extremity and right lower extremity radicular types of symptoms.  She is not a diabetic.  She takes anti-inflammatories over-the-counter on a rare basis.  She denies any change in bowel or bladder function.   On exam both hips move smoothly and fluidly with no blocks or rotation.  Most of her pain seems to be with positive straight leg raise on both sides more so on the right than the left but she does have left lower extremity radicular symptoms as well.  PAIN:  Are you having pain 3/10 LLE  PRECAUTIONS: None  RED FLAGS: None   WEIGHT BEARING RESTRICTIONS: No  FALLS:  Has patient fallen in last 6 months? No  LIVING ENVIRONMENT: Lives with: lives with their spouse Lives in: House/apartment Stairs: Yes: External: 5 steps; bilateral but cannot reach both Has following equipment at home: Single point cane and Walker - 2 wheeled  PLOF: Independent and Independent with basic ADLs  PATIENT GOALS: to not hurt and I don't want to risk falling  NEXT MD VISIT: 09/20/23  OBJECTIVE:  Note: Objective measures were completed at Evaluation unless otherwise noted.  DIAGNOSTIC FINDINGS:  2 views of the lumbar spine show a significant grade 1 spondylolisthesis at L4-L5 with some degenerative changes as well. An AP pelvis and lateral of the right hip shows a well-seated right total hip arthroplasty. There is moderate arthritis of the left hip.    COGNITION: Overall cognitive status: Within functional limits for tasks assessed     SENSATION: WFL   POSTURE: rounded shoulders and forward head  PALPATION: Some tenderness in L2-L5  LUMBAR ROM:  AROM eval 09/28/23 10/03/23 10/20/23  Flexion Can touch toes, painful coming back up [facility] WFLS   Extension 25% with pain 25# with pain Limited 75% Limited 25%  Right lateral flexion Mid thigh with pain Limited 25% Limited 25% Limited 25%  Left lateral flexion Fibular head no pain Limited 25% Limited 25% WFLs  Right rotation 25% [facility] WFL   Left rotation 50% WFL WFL    (Blank rows = not tested)  LOWER EXTREMITY ROM:   grossly within functional ranges   LOWER EXTREMITY MMT:  grossly 5/5, some pain with resisted knee extension on RLE, hip extension 4/5  LUMBAR SPECIAL TESTS:  Straight leg raise test: Positive and FABER test: Positive  FUNCTIONAL TESTS:  5 times sit to stand: 10.39s Timed up and go (TUG): 15.53s w/o cane  GAIT: Distance walked: in clinic distances Assistive device utilized: Single point cane Level of assistance: Complete Independence  TREATMENT DATE:  01/09/24 Nustep L 6 10 min Stepping laterally over 6 inch box 10 x with UE support- CGA Resisted stepping for strength and balance  fwd and laterally CGA 3# step taps 8 inch from 1 foot out 20 x 2 x 3# single HHA high knee marching 25 feet fwd and walking backward 2 x eahc 3# vector taps  01/04/24 Nustep L 5 10 min LE only 30# resisted gait 5 x fwd and back, then 3 x each side Sit to stand on airex 10 x CGA- no LOB Vector taps on airex CGA, only 1 LOB that required assist Obstacles course stepping on/over and around- CGA with cuing to get closer to obj and step over vs swing Fitter 1 blue standing hip flex,ext and abd 15 x each CGA Leg press 30# 2 sets10 Calf raises 30# 2 sets 10   01/02/24 Nustep L 5 8 min LE only 20# side stepping over 2 ( 1 inch) sticks 5 x each side CG-min A 3# more square stepping fwd and SW CG-min A 3# HHA marching fwd and backward 25 feet 3# side stepping 10 feet 4 x each Fitter 1 blue standing hip flex,ext and abd 15 x each CGA   12/29/23 Answered all pnt questions at length re: MD appt. Helping her to understand everything better and how how entire Lower quadrep works  together. Addressed questions re: knee brace and stated if tape helps okay to stick with it Information given about [employer] for orthotics an dincerts Updated HEP Nustep L 5 10 min  12/21/23 Taped left knee for medial pull and educ pt and husband on taping 3# SLR 2 sets 10 3 # SLR with ER 2 sets 10 3# KTC 2 sets 10 3# IR and ER 2 sets 10 Nustep L 5 Resisted gait Stepping actvities  Faom mat for balance  12/15/23 BERG 50/56 Nustep L 5 More Square- balance and stepping activities Vector taps with green tband 10 x BIL Side stepping green tband 10 feet 5 x each Standing on BIL dyna disc func reaching and mini squats 6 inch alt step taps 20 x 2 sets KT tape to left patella to shift medially    12/14/23 Nustep L 5 8 min Step up for ADD LEFT LE 2 sets 10 with HHA with 4  inch ( 6 inch too hard/high) Foam mat side stepping 5 x in // bars Knee ext 10# 2 sets 10 with ball squeeze for VMO HS curl 25# 2 sets 10 with ball squeeze Trunk flex and ext with  resistance 20 x with VMO ball squeeze     12/08/23 Nustep L 5 BERG 50/56 LAQ with ball squeeze 3# 2 sets 10 SAQ with ball squeeze 3# 2 sets 10 SLR with ER 3# 2 sets 10 3# side stepping 10 feet 3 x SL ADD 3# 10 x KT tape for laterally shifted left patella   12/01/23 Nustep L 5 Tandem gait on foam beam 5 x minimal UE support Side stepping on foam mat 5 x each way min UE support More square L 2 stepping fwd and SW STS 10 x from standard chair 10 x BIL LE And trunk PROM with STW to left ITB     11/14/23 BERG 47/56 Nustep L 5 6 inch lateral step across min a with cuing to keep feet fwd 10 x Red tband seated IR/ER 15 x each Red tband side stepping on foam beam no hands CGA 3 x Tandem on beam no hands 2 x CGA Leg Press 30# 3 sets 10 feet 3 ways for hip Navigating various height steps no AD CGA       09/05/23 EVAL                                                                                                                                 PATIENT EDUCATION:  Education details: POC, HEP, sciatica, hip flexor tightness sleeping in recliners Person educated: Patient Education method: Medical Illustrator Education comprehension: verbalized understanding and returned demonstration  HOME EXERCISE PROGRAM:  12/29/23 adjusted HEP-Access Code: [email or web address] URL: [email or web address] Date: 12/29/2023 Prepared by: Jon Belanna Manring  Exercises - Seated Long Arc Quad  - 3 x daily - 7 x weekly - 1 sets - 15 reps - 3-5 hold - Standing Hip Flexion with Resistance at Ankles and Counter Support  - 1-2 x daily - 7 x weekly - 2 sets - 10 reps - Standing Hip Abduction with Resistance at Ankles and Unilateral Counter Support  - 1-2 x daily - 7 x weekly - 2 sets - 10 reps - Standing Hip Extension with Resistance at Ankles and Unilateral Counter Support  - 1-2 x daily - 7 x weekly - 2 sets - 10 reps  Added to HEP. LAQ with ball squeeze, SL hip ADD and SLR with ER Access Code: [email or web address] URL: [email or web address] Date: 10/03/2023 Prepared by: Dewey Viens  Exercises - Supine Bridge  - 1 x daily - 7 x weekly - 1 sets - 10 reps - 3 hold - Supine Hip Adduction Isometric with Ball  - 1 x daily - 7 x weekly - 2 sets - 10 reps - Supine March  - 1 x daily - 7 x weekly - 2 sets - 10 reps - Hooklying Clamshell with Resistance  - 1 x daily - 7 x weekly - 2 sets - 10 reps - Supine Straight Leg Raises  - 1 x daily - 7 x weekly - 3 sets -  10 reps - Seated Long Arc Quad  - 1 x daily - 7 x weekly - 2 sets - 10 reps - Seated March  - 1 x daily - 7 x weekly - 2 sets - 10 reps - Seated Hip Flexion and External Rotation  - 1 x daily - 7 x weekly - 2 sets - 10 reps - Standing March with Counter Support  - 1 x daily - 7 x weekly - 1 sets - 10 reps - Standing Hip Abduction with Counter Support  - 1 x daily - 7 x weekly - 1 sets - 10 reps - Standing Hip Extension with Counter  Support  - 1 x daily - 7 x weekly - 1 sets - 10 reps Access Code: [email or web address] URL: [email or web address] Date: 09/05/2023 Prepared by: Almetta Fam  Exercises - Supine Lower Trunk Rotation  - 1 x daily - 7 x weekly - 2 sets - 10 reps - Supine Bridge  - 1 x daily - 7 x weekly - 2 sets - 10 reps - Supine Figure 4 Piriformis Stretch  - 1 x daily - 7 x weekly - 2 reps - 15 hold - Modified Thomas Stretch  - 1 x daily - 7 x weekly - 2 reps - 15 hold  ASSESSMENT:  CLINICAL IMPRESSION: pnt continues to have less pain and increased func. Wearing orthotics and exercise class 1 x a week. Progress ex for strength and func as documented above. Working on stepping up and out without LOB  OBJECTIVE IMPAIRMENTS: Abnormal gait, decreased activity tolerance, decreased balance, decreased ROM, decreased strength, and pain.     ACTIVITY LIMITATIONS: bending, squatting, stairs, and locomotion level  PARTICIPATION LIMITATIONS: cleaning, laundry, driving, shopping, community activity, and yard work  PERSONAL FACTORS: Age, Past/current experiences, Time since onset of injury/illness/exacerbation, and 1-2 comorbidities: arthritis, hx of surgery are also affecting patient's functional outcome.   REHAB POTENTIAL: Good  CLINICAL DECISION MAKING: Stable/uncomplicated  EVALUATION COMPLEXITY: Low  GOALS: Goals reviewed with patient? Yes  SHORT TERM GOALS: Target date: 10/10/23  Patient will be independent with initial HEP.  Baseline:  Goal status: 09/14/23 progressing  09/28/23 MET  2.  Patient will report centralization of radicular symptoms.  Baseline: pain in both legs Goal status: 09/28/23 no changes   10/03/23 progressing   MET 10/20/23   LONG TERM GOALS: Target date: 01/12/24  Patient will be independent with advanced/ongoing HEP to improve outcomes and carryover.  Baseline:  Goal status: MET 10/20/23 btwn HEP and ex class  2.  Patient will report 50-75% improvement in low back pain to  improve QOL.  Baseline: 5/10 Goal status: 10/03/23 progressing  10/20/23 40% progressing and 10/24/23 10/31/23 progressing   11/14/23 progressing  12/15/23 progressing 12/21/23 progressing  and 12/29/23  3.  Patient will demonstrate full pain free lumbar ROM to perform ADLs.   Baseline: see chart Goal status: 10/03/23 progressing  and 10/20/23  and 10/24/23. 9/9 WFL except ext limited 50%  12/01/23 WFLs ext limited 75%  12/15/23 WFL minus extension limited 75%  and 12/21/23  4.  Patient will be up and down a set of stairs without pain.  Baseline: pain going up and down Goal status: ongoing 10/03/23  progressing 10/20/23  and 10/24/23  and 11/10/23  and 11/14/23  12/15/23 varies but overall reports much better  01/04/24 MET  5.  Patient will be able to sleep in her bed again for at least 3-4 hrs.  Baseline: 1 hr tolerance  Goal status:  10/03/23 progressing  and 10/20/23. 10/31/23 progressing slowly- hard to get comfortable  progressing 11/10/23  12/01/23 on going  and 12/15/23  01/04/24 still an ongoing issue- mostly back  6.  Patient will tolerate 30 min of standing to perform ADLs and household chores Baseline: ~10 mins Goal status: 10/03/23 progressing 10/20/23 progressing 15-20  . MET 10/24/23 pt stated stated did 30-45 min  7.  Goal added 11/14/23 Will increase Berg to 52/56 for increased safety and function to reduce risk for falls. Baseline:  11/14/23 47/56  12/01/23 progressing   12/15/23 50/56 progressing  and 01/04/24  8.  Increase Gait to be able to walk 10 minutes without assistive device.  12/01/23 progressing  and 12/29/23 PLAN:  PT FREQUENCY: 2x/week  PT DURATION: 10 weeks  PLANNED INTERVENTIONS: 97110-Therapeutic exercises, 97530- Therapeutic activity, 97112- Neuromuscular re-education, 97535- Self Care, [id number]- Manual therapy, [phone]- Gait training, [phone]- Electrical stimulation (unattended), [id number]- Vasopneumatic device, [id number]- Traction (mechanical), [phone] (1-2 muscles), [id number] (3+ muscles)- Dry  Needling, Patient/Family education, Balance training, Stair training, Taping, Joint mobilization, Spinal mobilization, Cryotherapy, and Moist heat.  PLAN FOR NEXT SESSION    will need renewal next session  Jon Sansa Alkema PTA 01/09/24 11:42 AM

## 2024-01-12 ENCOUNTER — Ambulatory Visit: Admitting: Physical Therapy

## 2024-01-12 DIAGNOSIS — M5417 Radiculopathy, lumbosacral region: Secondary | ICD-10-CM

## 2024-01-12 DIAGNOSIS — M6281 Muscle weakness (generalized): Secondary | ICD-10-CM | POA: Diagnosis not present

## 2024-01-12 DIAGNOSIS — M25551 Pain in right hip: Secondary | ICD-10-CM | POA: Diagnosis not present

## 2024-01-12 DIAGNOSIS — M1712 Unilateral primary osteoarthritis, left knee: Secondary | ICD-10-CM | POA: Diagnosis not present

## 2024-01-12 DIAGNOSIS — M25552 Pain in left hip: Secondary | ICD-10-CM | POA: Diagnosis not present

## 2024-01-12 DIAGNOSIS — M7062 Trochanteric bursitis, left hip: Secondary | ICD-10-CM | POA: Diagnosis not present

## 2024-01-12 NOTE — Therapy (Signed)
 OUTPATIENT PHYSICAL THERAPY THORACOLUMBAR TREATMENT     Patient Name: Dilara G Waddington MRN: [id number] DOB:12-14-1945, 78 y.o., female Today's Date: 01/12/2024  END OF SESSION:  PT End of Session - 01/12/24 0912     Visit Number 26    Date for Recertification  02/09/24    PT Start Time 0913    PT Stop Time 1000    PT Time Calculation (min) 47 min            Past Medical History:  Diagnosis Date   Allergy    Anxiety    Arthritis    Asthma    Complication of anesthesia    pt states has difficulty awakening; also has increased sinus drainage   Constipation    Edema, lower extremity    Encounter for interrogation of cardiac pacemaker 09/05/2018   Falls    GERD (gastroesophageal reflux disease)    H/O back injury    History of bronchitis    History of colon polyps    Hypertension    Imbalance    Kidney cysts    pt states not sure which kidney does see kidney specialist yearly pt states every thing okay currently    Legally blind in right eye, as defined in [place]     Mobitz type II atrioventricular block 11/08/2016   Multiple gastric ulcers    Obesity    OSA (obstructive sleep apnea) 05/03/2022   Pacemaker S/P St Jude [employer]urity MRI model [id number] 05/20/2016   Renal stone    Retinal vein occlusion (HCC)    Shortness of breath    Sinus node dysfunction (HCC) 12/16/2018   Tingling    left arm    Trace cataracts    Urinary frequency    Vitamin D  deficiency    Past Surgical History:  Procedure Laterality Date   CARDIOVASCULAR STRESS TEST  dec 2016   CHOLECYSTECTOMY     COLONOSCOPY  2011   EYE SURGERY Bilateral    Cataract surgery.    LEFT HEART CATH AND CORONARY ANGIOGRAPHY N/A 05/19/2016   Procedure: Left Heart Cath and Coronary Angiography;  Surgeon: Salena Negri, MD;  Location: [facility];  Service: Cardiovascular;  Laterality: N/A;   PACEMAKER IMPLANT N/A 05/20/2016   Procedure: Pacemaker Implant;  Surgeon: Will Gladis Norton, MD;  Location: [facility];  Service: Cardiovascular;  Laterality: N/A;   TONSILLECTOMY     TOTAL HIP ARTHROPLASTY Right 01/30/2015   Procedure: RIGHT TOTAL HIP ARTHROPLASTY ANTERIOR APPROACH AND REMOVAL LIPOMA RIGHT HIP;  Surgeon: Lonni CINDERELLA Poli, MD;  Location: [facility];  Service: Orthopedics;  Laterality: Right;   UPPER GI ENDOSCOPY     Patient Active Problem List   Diagnosis Date Noted   Back pain 12/26/2022   Fluid retention in legs 11/13/2022   Stress 06/14/2022   Obesity (HCC)- Start BMI 40.06 05/12/2022   OSA (obstructive sleep apnea) 05/03/2022   Hyperglycemia 03/15/2022   Heart block AV complete (HCC) 01/05/2022   Pulmonary hypertension (HCC) 11/19/2021   Chronic diastolic (congestive) heart failure (HCC) 06/01/2020   Pacemaker S/P St Jude Medical Assurity MRI model [id number] 05/20/2016   History of total right hip arthroplasty 01/30/2015   Arthritis 09/11/2014   Renal cyst 09/04/2014   Allergic rhinitis due to pollen 09/04/2014   Essential hypertension 07/08/2010   GERD (gastroesophageal reflux disease) 07/08/2010   Calcium oxalate renal stones 07/08/2010   Hyperlipidemia 07/08/2010    PCP: Norleen Jobs  REFERRING PROVIDER: Lonni Poli  REFERRING DIAG:  S03.358 (ICD-10-CM) - History of total right hip arthroplasty  M54.41,G89.29 (ICD-10-CM) - Chronic right-sided low back pain with right-sided sciatica    Rationale for Evaluation and Treatment: Rehabilitation  THERAPY DIAG:  Radiculopathy, lumbosacral region  Bilateral hip pain  Muscle weakness (generalized)  ONSET DATE: 08/16/23 referral  SUBJECTIVE:                                                                                                                                                                                           SUBJECTIVE STATEMENT:   walking 2 days ago and this morning and left hip extended too far fwd and then felt weak and like it wanted to buckle. Did class again Wednesday 1 x a  week. No squats or lunges- modifying as needed PERTINENT HISTORY:  Patient is a 78 year old female who is almost 9 years out from a right total hip arthroplasty.  She comes in today ambulating with a cane.  She has been having a hard time getting up from a seated position and up from the floor when she is down on the floor.  She reports sciatic pain on the right side and also some left lower extremity and right lower extremity radicular types of symptoms.  She is not a diabetic.  She takes anti-inflammatories over-the-counter on a rare basis.  She denies any change in bowel or bladder function.   On exam both hips move smoothly and fluidly with no blocks or rotation.  Most of her pain seems to be with positive straight leg raise on both sides more so on the right than the left but she does have left lower extremity radicular symptoms as well.  PAIN:  Are you having pain 3/10 LLE  PRECAUTIONS: None  RED FLAGS: None   WEIGHT BEARING RESTRICTIONS: No  FALLS:  Has patient fallen in last 6 months? No  LIVING ENVIRONMENT: Lives with: lives with their spouse Lives in: House/apartment Stairs: Yes: External: 5 steps; bilateral but cannot reach both Has following equipment at home: Single point cane and Walker - 2 wheeled  PLOF: Independent and Independent with basic ADLs  PATIENT GOALS: to not hurt and I don't want to risk falling  NEXT MD VISIT: 09/20/23  OBJECTIVE:  Note: Objective measures were completed at Evaluation unless otherwise noted.  DIAGNOSTIC FINDINGS:  2 views of the lumbar spine show a significant grade 1 spondylolisthesis at L4-L5 with some degenerative changes as well. An AP pelvis and lateral of the right hip shows a well-seated right total hip arthroplasty. There is moderate arthritis of the left hip.    COGNITION: Overall cognitive status:  Within functional limits for tasks assessed     SENSATION: WFL   POSTURE: rounded shoulders and forward  head  PALPATION: Some tenderness in L2-L5  LUMBAR ROM:   AROM eval 09/28/23 10/03/23 10/20/23 01/12/24  Flexion Can touch toes, painful coming back up [facility] WFLS WFLs  Extension 25% with pain 25# with pain Limited 75% Limited 25% WFLS  Right lateral flexion Mid thigh with pain Limited 25% Limited 25% Limited 25% WFLs  Left lateral flexion Fibular head no pain Limited 25% Limited 25% WFLs WFLS  Right rotation 25% [facility] WFL  WFLS  Left rotation 50% WFL WFL  WFLS   (Blank rows = not tested)  LOWER EXTREMITY ROM:   grossly within functional ranges   LOWER EXTREMITY MMT:  grossly 5/5, some pain with resisted knee extension on RLE, hip extension 4/5  LUMBAR SPECIAL TESTS:  Straight leg raise test: Positive and FABER test: Positive  FUNCTIONAL TESTS:  5 times sit to stand: 10.39s Timed up and go (TUG): 15.53s w/o cane  GAIT: Distance walked: in clinic distances Assistive device utilized: Single point cane Level of assistance: Complete Independence  TREATMENT DATE:  01/12/24 Goals assessed and documented for renewal Standing stepping 2 min with UE support Fitter standing 3 way BIL 15 x each- UE support 2 blue STW to left lateral thigh,less tightness and tender laterally but more quad tightness. Very tender over GT so added ionto 1.2 cc dex 4 hour patch  01/09/24 Nustep L 6 10 min Stepping laterally over 6 inch box 10 x with UE support- CGA Resisted stepping for strength and balance  fwd and laterally CGA 3# step taps 8 inch from 1 foot out 20 x 2 x 3# single HHA high knee marching 25 feet fwd and walking backward 2 x eahc 3# vector taps  01/04/24 Nustep L 5 10 min LE only 30# resisted gait 5 x fwd and back, then 3 x each side Sit to stand on airex 10 x CGA- no LOB Vector taps on airex CGA, only 1 LOB that required assist Obstacles course stepping on/over and around- CGA with cuing to get closer to obj and step over vs swing Fitter 1 blue standing hip flex,ext and abd 15 x  each CGA Leg press 30# 2 sets10 Calf raises 30# 2 sets 10   01/02/24 Nustep L 5 8 min LE only 20# side stepping over 2 ( 1 inch) sticks 5 x each side CG-min A 3# more square stepping fwd and SW CG-min A 3# HHA marching fwd and backward 25 feet 3# side stepping 10 feet 4 x each Fitter 1 blue standing hip flex,ext and abd 15 x each CGA   12/29/23 Answered all pnt questions at length re: MD appt. Helping her to understand everything better and how how entire Lower quadrep works together. Addressed questions re: knee brace and stated if tape helps okay to stick with it Information given about [employer] for orthotics an dincerts Updated HEP Nustep L 5 10 min  12/21/23 Taped left knee for medial pull and educ pt and husband on taping 3# SLR 2 sets 10 3 # SLR with ER 2 sets 10 3# KTC 2 sets 10 3# IR and ER 2 sets 10 Nustep L 5 Resisted gait Stepping actvities  Faom mat for balance  12/15/23 BERG 50/56 Nustep L 5 More Square- balance and stepping activities Vector taps with green tband 10 x BIL Side stepping green tband 10 feet 5 x  each Standing on BIL dyna disc func reaching and mini squats 6 inch alt step taps 20 x 2 sets KT tape to left patella to shift medially    12/14/23 Nustep L 5 8 min Step up for ADD LEFT LE 2 sets 10 with HHA with 4  inch ( 6 inch too hard/high) Foam mat side stepping 5 x in // bars Knee ext 10# 2 sets 10 with ball squeeze for VMO HS curl 25# 2 sets 10 with ball squeeze Trunk flex and ext with resistance 20 x with VMO ball squeeze     12/08/23 Nustep L 5 BERG 50/56 LAQ with ball squeeze 3# 2 sets 10 SAQ with ball squeeze 3# 2 sets 10 SLR with ER 3# 2 sets 10 3# side stepping 10 feet 3 x SL ADD 3# 10 x KT tape for laterally shifted left patella   12/01/23 Nustep L 5 Tandem gait on foam beam 5 x minimal UE support Side stepping on foam mat 5 x each way min UE support More square L 2 stepping fwd and SW STS 10  x from standard chair 10 x BIL LE And trunk PROM with STW to left ITB     11/14/23 BERG 47/56 Nustep L 5 6 inch lateral step across min a with cuing to keep feet fwd 10 x Red tband seated IR/ER 15 x each Red tband side stepping on foam beam no hands CGA 3 x Tandem on beam no hands 2 x CGA Leg Press 30# 3 sets 10 feet 3 ways for hip Navigating various height steps no AD CGA       09/05/23 EVAL                                                                                                                                PATIENT EDUCATION:  Education details: POC, HEP, sciatica, hip flexor tightness sleeping in recliners Person educated: Patient Education method: Explanation and Demonstration Education comprehension: verbalized understanding and returned demonstration  HOME EXERCISE PROGRAM:  12/29/23 adjusted HEP-Access Code: [email or web address] URL: [email or web address] Date: 12/29/2023 Prepared by: Jon Aizlyn Schifano  Exercises - Seated Long Arc Quad  - 3 x daily - 7 x weekly - 1 sets - 15 reps - 3-5 hold - Standing Hip Flexion with Resistance at Ankles and Counter Support  - 1-2 x daily - 7 x weekly - 2 sets - 10 reps - Standing Hip Abduction with Resistance at Ankles and Unilateral Counter Support  - 1-2 x daily - 7 x weekly - 2 sets - 10 reps - Standing Hip Extension with Resistance at Ankles and Unilateral Counter Support  - 1-2 x daily - 7 x weekly - 2 sets - 10 reps  Added to HEP. LAQ with ball squeeze, SL hip ADD and SLR with ER Access Code: [email or web address] URL: [email or web address] Date: 10/03/2023 Prepared by: Caralyn Twining  Exercises - Supine Bridge  - 1 x  daily - 7 x weekly - 1 sets - 10 reps - 3 hold - Supine Hip Adduction Isometric with Ball  - 1 x daily - 7 x weekly - 2 sets - 10 reps - Supine March  - 1 x daily - 7 x weekly - 2 sets - 10 reps - Hooklying Clamshell with Resistance  - 1 x daily - 7 x weekly - 2 sets - 10 reps - Supine  Straight Leg Raises  - 1 x daily - 7 x weekly - 3 sets - 10 reps - Seated Long Arc Quad  - 1 x daily - 7 x weekly - 2 sets - 10 reps - Seated March  - 1 x daily - 7 x weekly - 2 sets - 10 reps - Seated Hip Flexion and External Rotation  - 1 x daily - 7 x weekly - 2 sets - 10 reps - Standing March with Counter Support  - 1 x daily - 7 x weekly - 1 sets - 10 reps - Standing Hip Abduction with Counter Support  - 1 x daily - 7 x weekly - 1 sets - 10 reps - Standing Hip Extension with Counter Support  - 1 x daily - 7 x weekly - 1 sets - 10 reps Access Code: [email or web address] URL: [email or web address] Date: 09/05/2023 Prepared by: Almetta Fam  Exercises - Supine Lower Trunk Rotation  - 1 x daily - 7 x weekly - 2 sets - 10 reps - Supine Bridge  - 1 x daily - 7 x weekly - 2 sets - 10 reps - Supine Figure 4 Piriformis Stretch  - 1 x daily - 7 x weekly - 2 reps - 15 hold - Modified Thomas Stretch  - 1 x daily - 7 x weekly - 2 reps - 15 hold  ASSESSMENT:  CLINICAL IMPRESSION:  pnt arrives stating -walking 2 days ago and this morning and left hip extended too far fwd and then felt weak and like it wanted to buckle. Did class again Wednesday 1 x a week. No squats or lunges- modifying as needed Assessed and documented goals for renewal. Progressed ex and added in ITB rolling again to address hip complaints- tightness noted. Tender over GT so added ionto and put in orders     OBJECTIVE IMPAIRMENTS: Abnormal gait, decreased activity tolerance, decreased balance, decreased ROM, decreased strength, and pain.     ACTIVITY LIMITATIONS: bending, squatting, stairs, and locomotion level  PARTICIPATION LIMITATIONS: cleaning, laundry, driving, shopping, community activity, and yard work  PERSONAL FACTORS: Age, Past/current experiences, Time since onset of injury/illness/exacerbation, and 1-2 comorbidities: arthritis, hx of surgery are also affecting patient's functional outcome.   REHAB POTENTIAL:  Good  CLINICAL DECISION MAKING: Stable/uncomplicated  EVALUATION COMPLEXITY: Low  GOALS: Goals reviewed with patient? Yes  SHORT TERM GOALS: Target date: 10/10/23  Patient will be independent with initial HEP.  Baseline:  Goal status: 09/14/23 progressing  09/28/23 MET  2.  Patient will report centralization of radicular symptoms.  Baseline: pain in both legs Goal status: 09/28/23 no changes   10/03/23 progressing   MET 10/20/23   LONG TERM GOALS: Target date: 02/09/24  Patient will be independent with advanced/ongoing HEP to improve outcomes and carryover.  Baseline:  Goal status: MET 10/20/23 btwn HEP and ex class  2.  Patient will report 50-75% improvement in low back pain to improve QOL.  Baseline: 5/10 Goal status: 10/03/23 progressing  10/20/23 40% progressing and 10/24/23 10/31/23 progressing   11/14/23 progressing  12/15/23 progressing 12/21/23 progressing  and 12/29/23 01/12/24 overall 80-85% better MET  3.  Patient will demonstrate full pain free lumbar ROM to perform ADLs.   Baseline: see chart Goal status: 10/03/23 progressing  and 10/20/23  and 10/24/23. 9/9 WFL except ext limited 50%  12/01/23 WFLs ext limited 75%  12/15/23 WFL minus extension limited 75%  and 12/21/23 01/12/24  WFLs including ext MET  4.  Patient will be up and down a set of stairs without pain.  Baseline: pain going up and down Goal status: ongoing 10/03/23  progressing 10/20/23  and 10/24/23  and 11/10/23  and 11/14/23  12/15/23 varies but overall reports much better  01/04/24 MET  5.  Patient will be able to sleep in her bed again for at least 3-4 hrs.  Baseline: 1 hr tolerance  Goal status: 10/03/23 progressing  and 10/20/23. 10/31/23 progressing slowly- hard to get comfortable  progressing 11/10/23  12/01/23 on going  and 12/15/23  01/04/24 still an ongoing issue- mostly back 01/12/24 on going  6.  Patient will tolerate 30 min of standing to perform ADLs and household chores Baseline: ~10 mins Goal status: 10/03/23  progressing 10/20/23 progressing 15-20  . MET 10/24/23 pt stated stated did 30-45 min  7.  Goal added 11/14/23 Will increase Berg to 52/56 for increased safety and function to reduce risk for falls. Baseline:  11/14/23 47/56  12/01/23 progressing   12/15/23 50/56 progressing  and 01/04/24   01/11/24 progressing 50/56  8.  Increase Gait to be able to walk 10 minutes without assistive device.  12/01/23 progressing  and 12/29/23 01/11/24 progressing  PLAN:  PT FREQUENCY: 2x/week  PT DURATION: 10 weeks  PLANNED INTERVENTIONS: 97110-Therapeutic exercises, 97530- Therapeutic activity, 97112- Neuromuscular re-education, 97535- Self Care, [id number]- Manual therapy, [phone]- Gait training, [phone]- Electrical stimulation (unattended), 97016- Vasopneumatic device, [id number]- Traction (mechanical), [id number]- Ionotophoresis 4mg /ml Dexamethasone , 79439 (1-2 muscles), 20561 (3+ muscles)- Dry Needling, Patient/Family education, Balance training, Stair training, Taping, Joint mobilization, Spinal mobilization, Cryotherapy, and Moist heat.  PLAN FOR NEXT SESSION    Renewal done  with goal to end early if able  Jon Or PTA 01/12/24 9:50 AM

## 2024-01-16 ENCOUNTER — Ambulatory Visit: Admitting: Physical Therapy

## 2024-01-16 ENCOUNTER — Ambulatory Visit (INDEPENDENT_AMBULATORY_CARE_PROVIDER_SITE_OTHER): Payer: Medicare Other

## 2024-01-16 DIAGNOSIS — M25551 Pain in right hip: Secondary | ICD-10-CM

## 2024-01-16 DIAGNOSIS — M7062 Trochanteric bursitis, left hip: Secondary | ICD-10-CM | POA: Diagnosis not present

## 2024-01-16 DIAGNOSIS — M5417 Radiculopathy, lumbosacral region: Secondary | ICD-10-CM | POA: Diagnosis not present

## 2024-01-16 DIAGNOSIS — M6281 Muscle weakness (generalized): Secondary | ICD-10-CM | POA: Diagnosis not present

## 2024-01-16 DIAGNOSIS — I441 Atrioventricular block, second degree: Secondary | ICD-10-CM | POA: Diagnosis not present

## 2024-01-16 DIAGNOSIS — M25552 Pain in left hip: Secondary | ICD-10-CM | POA: Diagnosis not present

## 2024-01-16 DIAGNOSIS — M1712 Unilateral primary osteoarthritis, left knee: Secondary | ICD-10-CM | POA: Diagnosis not present

## 2024-01-16 LAB — CUP PACEART REMOTE DEVICE CHECK
Battery Remaining Longevity: 22 mo
Battery Remaining Percentage: 22 %
Battery Voltage: 2.89 V
Brady Statistic AP VP Percent: 41 %
Brady Statistic AP VS Percent: 1 %
Brady Statistic AS VP Percent: 59 %
Brady Statistic AS VS Percent: 1 %
Brady Statistic RA Percent Paced: 41 %
Brady Statistic RV Percent Paced: 99 %
Date Time Interrogation Session: 20251125020014
Implantable Lead Connection Status: 753985
Implantable Lead Connection Status: 753985
Implantable Lead Implant Date: 20180330
Implantable Lead Implant Date: 20180330
Implantable Lead Location: 753859
Implantable Lead Location: 753860
Implantable Pulse Generator Implant Date: 20180330
Lead Channel Impedance Value: 380 Ohm
Lead Channel Impedance Value: 460 Ohm
Lead Channel Pacing Threshold Amplitude: 0.75 V
Lead Channel Pacing Threshold Amplitude: 0.75 V
Lead Channel Pacing Threshold Pulse Width: 0.5 ms
Lead Channel Pacing Threshold Pulse Width: 0.5 ms
Lead Channel Sensing Intrinsic Amplitude: 10.3 mV
Lead Channel Sensing Intrinsic Amplitude: 5 mV
Lead Channel Setting Pacing Amplitude: 2 V
Lead Channel Setting Pacing Amplitude: 2.5 V
Lead Channel Setting Pacing Pulse Width: 0.5 ms
Lead Channel Setting Sensing Sensitivity: 4 mV
Pulse Gen Model: 2272
Pulse Gen Serial Number: 7998534

## 2024-01-16 NOTE — Therapy (Signed)
 OUTPATIENT PHYSICAL THERAPY THORACOLUMBAR TREATMENT     Patient Name: Margaret Reese MRN: [id number] DOB:September 04, 1945, 78 y.o., female Today's Date: 01/16/2024  END OF SESSION:  PT End of Session - 01/16/24 1047     Visit Number 27    Date for Recertification  02/09/24    Authorization Type Medicare    PT Start Time 1055    PT Stop Time 1140    PT Time Calculation (min) 45 min            Past Medical History:  Diagnosis Date   Allergy    Anxiety    Arthritis    Asthma    Complication of anesthesia    pt states has difficulty awakening; also has increased sinus drainage   Constipation    Edema, lower extremity    Encounter for interrogation of cardiac pacemaker 09/05/2018   Falls    GERD (gastroesophageal reflux disease)    H/O back injury    History of bronchitis    History of colon polyps    Hypertension    Imbalance    Kidney cysts    pt states not sure which kidney does see kidney specialist yearly pt states every thing okay currently    Legally blind in right eye, as defined in [place]     Mobitz type II atrioventricular block 11/08/2016   Multiple gastric ulcers    Obesity    OSA (obstructive sleep apnea) 05/03/2022   Pacemaker S/P St Jude Medical Assurity MRI model [id number] 05/20/2016   Renal stone    Retinal vein occlusion (HCC)    Shortness of breath    Sinus node dysfunction (HCC) 12/16/2018   Tingling    left arm    Trace cataracts    Urinary frequency    Vitamin D  deficiency    Past Surgical History:  Procedure Laterality Date   CARDIOVASCULAR STRESS TEST  dec 2016   CHOLECYSTECTOMY     COLONOSCOPY  2011   EYE SURGERY Bilateral    Cataract surgery.    LEFT HEART CATH AND CORONARY ANGIOGRAPHY N/A 05/19/2016   Procedure: Left Heart Cath and Coronary Angiography;  Surgeon: Salena Negri, MD;  Location: [facility];  Service: Cardiovascular;  Laterality: N/A;   PACEMAKER IMPLANT N/A 05/20/2016   Procedure: Pacemaker Implant;  Surgeon: Will  Gladis Norton, MD;  Location: [facility];  Service: Cardiovascular;  Laterality: N/A;   TONSILLECTOMY     TOTAL HIP ARTHROPLASTY Right 01/30/2015   Procedure: RIGHT TOTAL HIP ARTHROPLASTY ANTERIOR APPROACH AND REMOVAL LIPOMA RIGHT HIP;  Surgeon: Lonni CINDERELLA Poli, MD;  Location: [facility];  Service: Orthopedics;  Laterality: Right;   UPPER GI ENDOSCOPY     Patient Active Problem List   Diagnosis Date Noted   Back pain 12/26/2022   Fluid retention in legs 11/13/2022   Stress 06/14/2022   Obesity (HCC)- Start BMI 40.06 05/12/2022   OSA (obstructive sleep apnea) 05/03/2022   Hyperglycemia 03/15/2022   Heart block AV complete (HCC) 01/05/2022   Pulmonary hypertension (HCC) 11/19/2021   Chronic diastolic (congestive) heart failure (HCC) 06/01/2020   Pacemaker S/P St Jude Medical Assurity MRI model [id number] 05/20/2016   History of total right hip arthroplasty 01/30/2015   Arthritis 09/11/2014   Renal cyst 09/04/2014   Allergic rhinitis due to pollen 09/04/2014   Essential hypertension 07/08/2010   GERD (gastroesophageal reflux disease) 07/08/2010   Calcium oxalate renal stones 07/08/2010   Hyperlipidemia 07/08/2010    PCP: Norleen  Joyce  REFERRING PROVIDER: Lonni Poli  REFERRING DIAG:  [phone] (ICD-10-CM) - History of total right hip arthroplasty  M54.41,G89.29 (ICD-10-CM) - Chronic right-sided low back pain with right-sided sciatica    Rationale for Evaluation and Treatment: Rehabilitation  THERAPY DIAG:  Radiculopathy, lumbosacral region  Bilateral hip pain  Muscle weakness (generalized)  ONSET DATE: 08/16/23 referral  SUBJECTIVE:                                                                                                                                                                                           SUBJECTIVE STATEMENT:   4 episodes of leg hyperextending yesterday seems more hip to me? Ionto felt like it was burning ( no phyical burn) -  dont think I want it again   PERTINENT HISTORY:  Patient is a 78 year old female who is almost 9 years out from a right total hip arthroplasty.  She comes in today ambulating with a cane.  She has been having a hard time getting up from a seated position and up from the floor when she is down on the floor.  She reports sciatic pain on the right side and also some left lower extremity and right lower extremity radicular types of symptoms.  She is not a diabetic.  She takes anti-inflammatories over-the-counter on a rare basis.  She denies any change in bowel or bladder function.   On exam both hips move smoothly and fluidly with no blocks or rotation.  Most of her pain seems to be with positive straight leg raise on both sides more so on the right than the left but she does have left lower extremity radicular symptoms as well.  PAIN:  Are you having pain 3/10 LLE  PRECAUTIONS: None  RED FLAGS: None   WEIGHT BEARING RESTRICTIONS: No  FALLS:  Has patient fallen in last 6 months? No  LIVING ENVIRONMENT: Lives with: lives with their spouse Lives in: House/apartment Stairs: Yes: External: 5 steps; bilateral but cannot reach both Has following equipment at home: Single point cane and Walker - 2 wheeled  PLOF: Independent and Independent with basic ADLs  PATIENT GOALS: to not hurt and I don't want to risk falling  NEXT MD VISIT: 09/20/23  OBJECTIVE:  Note: Objective measures were completed at Evaluation unless otherwise noted.  DIAGNOSTIC FINDINGS:  2 views of the lumbar spine show a significant grade 1 spondylolisthesis at L4-L5 with some degenerative changes as well. An AP pelvis and lateral of the right hip shows a well-seated right total hip arthroplasty. There is moderate arthritis of the left hip.    COGNITION: Overall cognitive status: Within functional  limits for tasks assessed     SENSATION: WFL   POSTURE: rounded shoulders and forward head  PALPATION: Some  tenderness in L2-L5  LUMBAR ROM:   AROM eval 09/28/23 10/03/23 10/20/23 01/12/24  Flexion Can touch toes, painful coming back up [facility] WFLS WFLs  Extension 25% with pain 25# with pain Limited 75% Limited 25% WFLS  Right lateral flexion Mid thigh with pain Limited 25% Limited 25% Limited 25% WFLs  Left lateral flexion Fibular head no pain Limited 25% Limited 25% WFLs WFLS  Right rotation 25% [facility] WFL  WFLS  Left rotation 50% WFL WFL  WFLS   (Blank rows = not tested)  LOWER EXTREMITY ROM:   grossly within functional ranges   LOWER EXTREMITY MMT:  grossly 5/5, some pain with resisted knee extension on RLE, hip extension 4/5  LUMBAR SPECIAL TESTS:  Straight leg raise test: Positive and FABER test: Positive  FUNCTIONAL TESTS:  5 times sit to stand: 10.39s Timed up and go (TUG): 15.53s w/o cane  GAIT: Distance walked: in clinic distances Assistive device utilized: Single point cane Level of assistance: Complete Independence  TREATMENT DATE:  01/16/24 Nustep L 6 10 min Reviewed HEP most important to do standing hip 3 way kicks, LAQ with ball squeeze and step ups Fitter 2 blue 3 way 15 x BIL 6# farmer carry 1 lap each hand Resisted gait fwd and laterally with step over STS with ADD ball squeeze 10x STS with tband ABD 10 x   01/12/24 Goals assessed and documented for renewal Standing stepping 2 min with UE support Fitter standing 3 way BIL 15 x each- UE support 2 blue STW to left lateral thigh,less tightness and tender laterally but more quad tightness. Very tender over GT so added ionto 1.2 cc dex 4 hour patch  01/09/24 Nustep L 6 10 min Stepping laterally over 6 inch box 10 x with UE support- CGA Resisted stepping for strength and balance  fwd and laterally CGA 3# step taps 8 inch from 1 foot out 20 x 2 x 3# single HHA high knee marching 25 feet fwd and walking backward 2 x eahc 3# vector taps  01/04/24 Nustep L 5 10 min LE only 30# resisted gait 5 x fwd and back,  then 3 x each side Sit to stand on airex 10 x CGA- no LOB Vector taps on airex CGA, only 1 LOB that required assist Obstacles course stepping on/over and around- CGA with cuing to get closer to obj and step over vs swing Fitter 1 blue standing hip flex,ext and abd 15 x each CGA Leg press 30# 2 sets10 Calf raises 30# 2 sets 10   01/02/24 Nustep L 5 8 min LE only 20# side stepping over 2 ( 1 inch) sticks 5 x each side CG-min A 3# more square stepping fwd and SW CG-min A 3# HHA marching fwd and backward 25 feet 3# side stepping 10 feet 4 x each Fitter 1 blue standing hip flex,ext and abd 15 x each CGA   12/29/23 Answered all pnt questions at length re: MD appt. Helping her to understand everything better and how how entire Lower quadrep works together. Addressed questions re: knee brace and stated if tape helps okay to stick with it Information given about [employer] for orthotics an dincerts Updated HEP Nustep L 5 10 min  12/21/23 Taped left knee for medial pull and educ pt and husband on taping 3# SLR 2 sets 10 3 # SLR with  ER 2 sets 10 3# KTC 2 sets 10 3# IR and ER 2 sets 10 Nustep L 5 Resisted gait Stepping actvities  Faom mat for balance  12/15/23 BERG 50/56 Nustep L 5 More Square- balance and stepping activities Vector taps with green tband 10 x BIL Side stepping green tband 10 feet 5 x each Standing on BIL dyna disc func reaching and mini squats 6 inch alt step taps 20 x 2 sets KT tape to left patella to shift medially    12/14/23 Nustep L 5 8 min Step up for ADD LEFT LE 2 sets 10 with HHA with 4  inch ( 6 inch too hard/high) Foam mat side stepping 5 x in // bars Knee ext 10# 2 sets 10 with ball squeeze for VMO HS curl 25# 2 sets 10 with ball squeeze Trunk flex and ext with resistance 20 x with VMO ball squeeze     12/08/23 Nustep L 5 BERG 50/56 LAQ with ball squeeze 3# 2 sets 10 SAQ with ball squeeze 3# 2 sets 10 SLR with ER 3# 2 sets  10 3# side stepping 10 feet 3 x SL ADD 3# 10 x KT tape for laterally shifted left patella   12/01/23 Nustep L 5 Tandem gait on foam beam 5 x minimal UE support Side stepping on foam mat 5 x each way min UE support More square L 2 stepping fwd and SW STS 10 x from standard chair 10 x BIL LE And trunk PROM with STW to left ITB     11/14/23 BERG 47/56 Nustep L 5 6 inch lateral step across min a with cuing to keep feet fwd 10 x Red tband seated IR/ER 15 x each Red tband side stepping on foam beam no hands CGA 3 x Tandem on beam no hands 2 x CGA Leg Press 30# 3 sets 10 feet 3 ways for hip Navigating various height steps no AD CGA       09/05/23 EVAL                                                                                                                                PATIENT EDUCATION:  Education details: POC, HEP, sciatica, hip flexor tightness sleeping in recliners Person educated: Patient Education method: Explanation and Demonstration Education comprehension: verbalized understanding and returned demonstration  HOME EXERCISE PROGRAM:  12/29/23 adjusted HEP-Access Code: [email or web address] URL: [email or web address] Date: 12/29/2023 Prepared by: Jon Ronda Kazmi  Exercises - Seated Long Arc Quad  - 3 x daily - 7 x weekly - 1 sets - 15 reps - 3-5 hold - Standing Hip Flexion with Resistance at Ankles and Counter Support  - 1-2 x daily - 7 x weekly - 2 sets - 10 reps - Standing Hip Abduction with Resistance at Ankles and Unilateral Counter Support  - 1-2 x daily - 7 x weekly - 2 sets - 10 reps - Standing Hip  Extension with Resistance at Ankles and Unilateral Counter Support  - 1-2 x daily - 7 x weekly - 2 sets - 10 reps  Added to HEP. LAQ with ball squeeze, SL hip ADD and SLR with ER Access Code: [email or web address] URL: [email or web address] Date: 10/03/2023 Prepared by: Nikia Mangino  Exercises - Supine Bridge  - 1 x daily - 7 x weekly - 1  sets - 10 reps - 3 hold - Supine Hip Adduction Isometric with Ball  - 1 x daily - 7 x weekly - 2 sets - 10 reps - Supine March  - 1 x daily - 7 x weekly - 2 sets - 10 reps - Hooklying Clamshell with Resistance  - 1 x daily - 7 x weekly - 2 sets - 10 reps - Supine Straight Leg Raises  - 1 x daily - 7 x weekly - 3 sets - 10 reps - Seated Long Arc Quad  - 1 x daily - 7 x weekly - 2 sets - 10 reps - Seated March  - 1 x daily - 7 x weekly - 2 sets - 10 reps - Seated Hip Flexion and External Rotation  - 1 x daily - 7 x weekly - 2 sets - 10 reps - Standing March with Counter Support  - 1 x daily - 7 x weekly - 1 sets - 10 reps - Standing Hip Abduction with Counter Support  - 1 x daily - 7 x weekly - 1 sets - 10 reps - Standing Hip Extension with Counter Support  - 1 x daily - 7 x weekly - 1 sets - 10 reps Access Code: [email or web address] URL: [email or web address] Date: 09/05/2023 Prepared by: Almetta Fam  Exercises - Supine Lower Trunk Rotation  - 1 x daily - 7 x weekly - 2 sets - 10 reps - Supine Bridge  - 1 x daily - 7 x weekly - 2 sets - 10 reps - Supine Figure 4 Piriformis Stretch  - 1 x daily - 7 x weekly - 2 reps - 15 hold - Modified Thomas Stretch  - 1 x daily - 7 x weekly - 2 reps - 15 hold  ASSESSMENT:  CLINICAL IMPRESSION:  4 episodes of leg hyperextending yesterday seems more hip to me? Ionto felt like it was burning ( no phyical burn) - dont think I want it again. MD f/u late Dec of early Jan. Reviewed important HEP as she has many ex,asked to do 4 x a week and ex class 1 x a week.    OBJECTIVE IMPAIRMENTS: Abnormal gait, decreased activity tolerance, decreased balance, decreased ROM, decreased strength, and pain.     ACTIVITY LIMITATIONS: bending, squatting, stairs, and locomotion level  PARTICIPATION LIMITATIONS: cleaning, laundry, driving, shopping, community activity, and yard work  PERSONAL FACTORS: Age, Past/current experiences, Time since onset of  injury/illness/exacerbation, and 1-2 comorbidities: arthritis, hx of surgery are also affecting patient's functional outcome.   REHAB POTENTIAL: Good  CLINICAL DECISION MAKING: Stable/uncomplicated  EVALUATION COMPLEXITY: Low  GOALS: Goals reviewed with patient? Yes  SHORT TERM GOALS: Target date: 10/10/23  Patient will be independent with initial HEP.  Baseline:  Goal status: 09/14/23 progressing  09/28/23 MET  2.  Patient will report centralization of radicular symptoms.  Baseline: pain in both legs Goal status: 09/28/23 no changes   10/03/23 progressing   MET 10/20/23   LONG TERM GOALS: Target date: 02/09/24  Patient will be independent with advanced/ongoing HEP to improve outcomes and carryover.  Baseline:  Goal status:  MET 10/20/23 btwn HEP and ex class  2.  Patient will report 50-75% improvement in low back pain to improve QOL.  Baseline: 5/10 Goal status: 10/03/23 progressing  10/20/23 40% progressing and 10/24/23 10/31/23 progressing   11/14/23 progressing  12/15/23 progressing 12/21/23 progressing  and 12/29/23 01/12/24 overall 80-85% better MET  3.  Patient will demonstrate full pain free lumbar ROM to perform ADLs.   Baseline: see chart Goal status: 10/03/23 progressing  and 10/20/23  and 10/24/23. 9/9 WFL except ext limited 50%  12/01/23 WFLs ext limited 75%  12/15/23 WFL minus extension limited 75%  and 12/21/23 01/12/24  WFLs including ext MET  4.  Patient will be up and down a set of stairs without pain.  Baseline: pain going up and down Goal status: ongoing 10/03/23  progressing 10/20/23  and 10/24/23  and 11/10/23  and 11/14/23  12/15/23 varies but overall reports much better  01/04/24 MET  5.  Patient will be able to sleep in her bed again for at least 3-4 hrs.  Baseline: 1 hr tolerance  Goal status: 10/03/23 progressing  and 10/20/23. 10/31/23 progressing slowly- hard to get comfortable  progressing 11/10/23  12/01/23 on going  and 12/15/23  01/04/24 still an ongoing issue- mostly  back 01/12/24 on going  6.  Patient will tolerate 30 min of standing to perform ADLs and household chores Baseline: ~10 mins Goal status: 10/03/23 progressing 10/20/23 progressing 15-20  . MET 10/24/23 pt stated stated did 30-45 min  7.  Goal added 11/14/23 Will increase Berg to 52/56 for increased safety and function to reduce risk for falls. Baseline:  11/14/23 47/56  12/01/23 progressing   12/15/23 50/56 progressing  and 01/04/24   01/11/24 progressing 50/56  8.  Increase Gait to be able to walk 10 minutes without assistive device.  12/01/23 progressing  and 12/29/23 01/11/24 progressing  PLAN:  PT FREQUENCY: 2x/week  PT DURATION: 10 weeks  PLANNED INTERVENTIONS: 97110-Therapeutic exercises, 97530- Therapeutic activity, 97112- Neuromuscular re-education, 97535- Self Care, 02859- Manual therapy, [phone]- Gait training, [phone]- Electrical stimulation (unattended), 97016- Vasopneumatic device, [id number]- Traction (mechanical), [id number]- Ionotophoresis 4mg /ml Dexamethasone , 79439 (1-2 muscles), 20561 (3+ muscles)- Dry Needling, Patient/Family education, Balance training, Stair training, Taping, Joint mobilization, Spinal mobilization, Cryotherapy, and Moist heat.  PLAN FOR NEXT SESSION  progress and work towards independent ex at home and ex class  Jon Or PTA 01/16/24 10:48 AM

## 2024-01-17 ENCOUNTER — Ambulatory Visit: Payer: Self-pay | Admitting: Cardiology

## 2024-01-17 NOTE — Progress Notes (Signed)
 Remote PPM Transmission

## 2024-01-24 ENCOUNTER — Other Ambulatory Visit: Payer: Self-pay | Admitting: Cardiology

## 2024-01-24 ENCOUNTER — Encounter: Payer: Self-pay | Admitting: Cardiology

## 2024-01-24 ENCOUNTER — Ambulatory Visit: Attending: Cardiology | Admitting: Cardiology

## 2024-01-24 VITALS — BP 126/74 | HR 89 | Resp 16 | Ht 61.0 in | Wt 201.6 lb

## 2024-01-24 DIAGNOSIS — E66812 Obesity, class 2: Secondary | ICD-10-CM | POA: Insufficient documentation

## 2024-01-24 DIAGNOSIS — I10 Essential (primary) hypertension: Secondary | ICD-10-CM | POA: Diagnosis not present

## 2024-01-24 DIAGNOSIS — Z950 Presence of cardiac pacemaker: Secondary | ICD-10-CM | POA: Insufficient documentation

## 2024-01-24 DIAGNOSIS — E782 Mixed hyperlipidemia: Secondary | ICD-10-CM | POA: Insufficient documentation

## 2024-01-24 DIAGNOSIS — Z6838 Body mass index (BMI) 38.0-38.9, adult: Secondary | ICD-10-CM | POA: Diagnosis not present

## 2024-01-24 DIAGNOSIS — R0602 Shortness of breath: Secondary | ICD-10-CM | POA: Diagnosis not present

## 2024-01-24 DIAGNOSIS — I441 Atrioventricular block, second degree: Secondary | ICD-10-CM | POA: Diagnosis not present

## 2024-01-24 DIAGNOSIS — R002 Palpitations: Secondary | ICD-10-CM

## 2024-01-24 NOTE — Progress Notes (Signed)
 Cardiology Office Note:  .   Date:  01/24/2024  ID:  Margaret Reese, DOB 1945/03/04, MRN [id number] PCP:  [name], MD  Former Cardiology Providers: Dr. Claudene, Dr. Levern, Dr. Ladona, and Emmalene Lawrence, APRN, FNP-C.  [facility] Providers Cardiologist:  [name], DO , [facility] (established care 07/25/2019) Electrophysiologist:  None  Click to update primary MD,subspecialty MD or APP then REFRESH:1}    Chief Complaint  Patient presents with   Shortness of Breath   Follow-up    History of Present Illness: .   Margaret Reese is a 78 y.o. Caucasian female whose past medical history and cardiovascular risk factors includes: Sleep apnea on CPAP, Asthma, Hypertension, secondary AV block status post pacemaker, hyperlipidemia, postmenopausal female, advanced age, obesity.   Patient is being followed by the practice given her history of pacemaker implant due to second-degree AV block and hypertension.  Since last office visit Margaret Reese denies any anginal chest pain or heart failure symptoms.   No hospitalizations or urgent care visits for cardiovascular reasons.   Margaret Reese has been compliant with her medical therapy.   Weight has trended up secondary to decreased mobility due to orthopedic issues.  Patient is very cognizant and focus is on lifestyle modifications.  Home SBP well controlled per patient. Compliant with medications, endorses no issues.   Margaret Reese is compliant with her CPAP on a regular basis.  Review of Systems: .   Review of Systems  Constitutional: Positive for weight gain.  Cardiovascular:  Negative for chest pain, dyspnea on exertion, leg swelling, palpitations and syncope.  Respiratory:  Positive for shortness of breath (improving).   Musculoskeletal:  Positive for arthritis and back pain.    Studies Reviewed:   Pacemaker in situ: St Jude Medical Assurity MRI  dual-chamber pacemaker for symptomatic bradycardia in March 2018  Remote PPM device  check:  01/16/2024 Remote pacemaker interrogation. Presenting Rhythm:A-V dual paced. Battery and lead parameters stable with stable capture and sensing. Device programming is appropriate. Continue remote monitoring.   EKG: EKG Interpretation Date/Time:  Wednesday January 24 2024 08:43:03 EST Text Interpretation: Atrial-sensed ventricular-paced rhythm with prolonged AV conduction When compared with ECG of 17-Jan-2023 08:27, Vent. rate has decreased BY   2 BPM Confirmed by Large Madonna [phone]) on 01/24/2024 8:54:23 AM  Echocardiogram: July 2021: LVEF 55-60%, grade 1 diastolic dysfunction, moderate TR, see report for additional details  July 2023: LVEF 57%.  Indeterminate diastolic filling pattern, mild to moderate TR, RVSP 41 mmHg.  See report for additional details  Stress Testing: December 2016: Myocardial perfusion is normal.  Low risk study  Heart Catheterization: 04/2016 Dr. Salena Claudene: Normal coronaries  RADIOLOGY: NA  Risk Assessment/Calculations:   NA   Labs:       Latest Ref Rng & Units 03/28/2023   11:28 AM 09/08/2022    1:46 PM 12/29/2021   10:47 AM  CBC  WBC 3.4 - 10.8 x10E3/uL 5.8  5.3  5.3   Hemoglobin 11.1 - 15.9 g/dL 87.1  87.0  86.4   Hematocrit 34.0 - 46.6 % 39.8  39.8  41.8   Platelets 150 - 450 x10E3/uL 218  213  221        Latest Ref Rng & Units 03/28/2023   11:28 AM 09/08/2022    1:46 PM 12/29/2021   10:47 AM  BMP  Glucose 70 - 99 mg/dL 77  80  82   BUN 8 - 27 mg/dL 21  17  15  Creatinine 0.57 - 1.00 mg/dL 9.12  9.26  9.13   BUN/Creat Ratio 12 - 28 24  23  17    Sodium 134 - 144 mmol/L 142  143  143   Potassium 3.5 - 5.2 mmol/L 4.3  4.3  4.4   Chloride 96 - 106 mmol/L 103  101  103   CO2 20 - 29 mmol/L 26  27  27    Calcium 8.7 - 10.3 mg/dL 89.5  89.3  89.5       Latest Ref Rng & Units 03/28/2023   11:28 AM 09/08/2022    1:46 PM 12/29/2021   10:47 AM  CMP  Glucose 70 - 99 mg/dL 77  80  82   BUN 8 - 27 mg/dL 21  17  15    Creatinine 0.57 - 1.00  mg/dL 9.12  9.26  9.13   Sodium 134 - 144 mmol/L 142  143  143   Potassium 3.5 - 5.2 mmol/L 4.3  4.3  4.4   Chloride 96 - 106 mmol/L 103  101  103   CO2 20 - 29 mmol/L 26  27  27    Calcium 8.7 - 10.3 mg/dL 89.5  89.3  89.5   Total Protein 6.0 - 8.5 g/dL 6.9  7.0  7.0   Total Bilirubin 0.0 - 1.2 mg/dL 0.8  0.7  0.9   Alkaline Phos 44 - 121 IU/L 124  96  95   AST 0 - 40 IU/L 18  20  24    ALT 0 - 32 IU/L 10  13  14      Lab Results  Component Value Date   CHOL 137 03/28/2023   HDL 60 03/28/2023   LDLCALC 63 03/28/2023   TRIG 69 03/28/2023   CHOLHDL 2.2 06/22/2021   No results for input(s): LIPOA in the last 8760 hours. No components found for: NTPROBNP No results for input(s): PROBNP in the last 8760 hours. Recent Labs    03/28/23 1128  TSH 2.980     Physical Exam:    Today's Vitals   01/24/24 0840  BP: 126/74  Pulse: 89  Resp: 16  SpO2: 95%  Weight: 201 lb 9.6 oz (91.4 kg)  Height: 5' 1 (1.549 m)   Body mass index is 38.09 kg/m. Wt Readings from Last 3 Encounters:  01/24/24 201 lb 9.6 oz (91.4 kg)  11/07/23 194 lb (88 kg)  10/10/23 200 lb (90.7 kg)    Physical Exam  Constitutional: No distress.  Age appropriate, hemodynamically stable.   Neck: No JVD present.  Cardiovascular: Normal rate, regular rhythm, S1 normal, S2 normal, intact distal pulses and normal pulses. Exam reveals no gallop, no S3 and no S4.  No murmur heard. Pulmonary/Chest: Effort normal and breath sounds normal. No stridor. Margaret Reese has no wheezes. Margaret Reese has no rales.  Pacemaker site is clean dry and intact (left infraclavicular region).   Abdominal: Soft. Bowel sounds are normal. Margaret Reese exhibits no distension. There is no abdominal tenderness.  Musculoskeletal:        General: No edema.     Cervical back: Neck supple.  Neurological: Margaret Reese is alert and oriented to person, place, and time. Margaret Reese has intact cranial nerves (2-12).  Skin: Skin is warm and moist.     Impression & Recommendation(s):   Impression:   ICD-10-CM   1. Shortness of breath  R06.02 EKG 12-Lead    2. Benign hypertension  I10     3. Mixed hyperlipidemia  E78.2  4. Pacemaker S/P St Jude Medical Assurity MRI model [id number]  Z95.0     5. Mobitz type 2 second degree AV block  I44.1     6. Class 2 severe obesity due to excess calories with serious comorbidity and body mass index (BMI) of 38.0 to 38.9 in adult  E66.812    Z68.38        Recommendation(s):  Shortness of breath Chronic and stable EKG AS-VP Medications reconciled Sleep apnea has improved after device therapy No additional testing needed at this time.   Benign hypertension Office blood pressures are well-controlled on current medical therapy. Continue Benicar  20mg  po qday  Continue Cardizem  120mg  po qday.  Reemphasized importance of low-salt diet.  Mixed hyperlipidemia Currently on simvastatin .   Margaret Reese denies myalgia or other side effects. Most recent lipids dated February 2025 reviewed as noted above, LDL 63 mg/dL.  Pacemaker S/P St Jude Medical Assurity MRI model [id number] Mobitz type 2 second degree AV block Most recent PPM report reviewed Patient has an upcoming appt with EP as well.   Class 2 severe obesity due to excess calories with serious comorbidity and body mass index (BMI) of 38.0 to 38.9 in adult [facility]) Weight gain since last office visit-due to orthopedic issues limited activity.  Patient is cognizant. Will implement lifestyle changes to help facilitate weight loss again Body mass index is 38.09 kg/m. I reviewed with her importance of diet, regular physical activity/exercise, weight loss.   Patient is educated on the importance of increasing physical activity gradually as tolerated with a goal of moderate intensity exercise for 30 minutes a day 5 days a week.   Orders Placed:  Orders Placed This Encounter  Procedures   EKG 12-Lead   Final Medication List:   No orders of the defined types were placed in this  encounter.   There are no discontinued medications.   Current Outpatient Medications:    albuterol  (VENTOLIN  HFA) 108 (90 Base) MCG/ACT inhaler, Inhale 1-2 puffs into the lungs every 6 (six) hours as needed for wheezing or shortness of breath., Disp: 8 g, Rfl: 2   aspirin  81 MG tablet, Take 81 mg by mouth daily., Disp: , Rfl:    buPROPion  (WELLBUTRIN  SR) 150 MG 12 hr tablet, Take 1 tablet (150 mg total) by mouth daily., Disp: 30 tablet, Rfl: 1   diltiazem  (CARDIZEM  CD) 120 MG 24 hr capsule, TAKE 1 CAPSULE BY MOUTH DAILY; HOLD IF BLOOD PRESSURE IS LESS THAN 100 MMHG, Disp: 90 capsule, Rfl: 3   loratadine (CLARITIN) 10 MG tablet, Take 10 mg by mouth daily as needed for allergies., Disp: , Rfl:    Multiple Vitamins-Minerals (PRESERVISION AREDS 2) CAPS, Take 1 capsule by mouth 2 (two) times daily. , Disp: , Rfl:    olmesartan  (BENICAR ) 20 MG tablet, TAKE 1 TABLET BY MOUTH EVERY DAY, Disp: 90 tablet, Rfl: 2   potassium citrate  (UROCIT-K ) 10 MEQ (1080 MG) SR tablet, Take 10 mEq by mouth 2 (two) times daily., Disp: , Rfl:    simvastatin  (ZOCOR ) 20 MG tablet, Take 1 tablet (20 mg total) by mouth daily., Disp: 90 tablet, Rfl: 3   Vitamin D , Ergocalciferol , (DRISDOL ) 1.25 MG (50000 UNIT) CAPS capsule, Take 1 capsule (50,000 Units total) by mouth every 7 (seven) days., Disp: 12 capsule, Rfl: 0  Consent:   N/A  Disposition:   1 year follow-up  Patient may be asked to follow-up sooner based on the results of the above-mentioned testing.  Her questions and concerns were  addressed to her satisfaction. Margaret Reese voices understanding of the recommendations provided during this encounter.   Discussed management of at least 2 chronic comorbid conditions.   EKG 01/24/2024 independently reviewed. Labs from February 2025 independently reviewed. Most recent pacemaker interrogation report from November 2025 reviewed. Prescription drug management. Patient education & coordination of care.  Signed, Madonna Michele HAS,  [facility]  A Division of [facility] [address], [place], [place] [address]  01/24/2024

## 2024-01-24 NOTE — Patient Instructions (Signed)
 Medication Instructions:  No Changes *If you need a refill on your cardiac medications before your next appointment, please call your pharmacy*  Lab Work: None  Follow-Up: At [facility], you and your health needs are our priority.  As part of our continuing mission to provide you with exceptional heart care, our providers are all part of one team.  This team includes your primary Cardiologist (physician) and Advanced Practice Providers or APPs (Physician Assistants and Nurse Practitioners) who all work together to provide you with the care you need, when you need it.  Your next appointment:   1 year(s) (We will mail a reminder letter around October 2026; please call for a December 2026 appointment)  Provider:   [name], DO   Other Instructions Please call us  or send a MyChart message with any Cardiology related questions/concerns.  [phone].  Thank you!

## 2024-01-26 ENCOUNTER — Ambulatory Visit: Admitting: Physical Therapy

## 2024-01-26 ENCOUNTER — Telehealth: Payer: Self-pay

## 2024-01-26 NOTE — Telephone Encounter (Signed)
 Pt. Needs to reschedule AWV.    Copied from CRM [id number]. Topic: Appointments - Appointment Scheduling >> Jan 26, 2024  3:03 PM Travis F wrote: Patient is calling in to reschedule her AWV with Dr. Joyce, patient says Dr. Joyce says it needs to be with him. Attempted to reschedule but the system gave an error. Patient says it can be the week before or the week after, but 05/26 conflicts with another appointment.

## 2024-01-30 ENCOUNTER — Ambulatory Visit: Attending: Physician Assistant | Admitting: Physical Therapy

## 2024-01-30 DIAGNOSIS — M6281 Muscle weakness (generalized): Secondary | ICD-10-CM | POA: Insufficient documentation

## 2024-01-30 DIAGNOSIS — M25552 Pain in left hip: Secondary | ICD-10-CM | POA: Diagnosis present

## 2024-01-30 DIAGNOSIS — M5417 Radiculopathy, lumbosacral region: Secondary | ICD-10-CM | POA: Diagnosis present

## 2024-01-30 DIAGNOSIS — M25551 Pain in right hip: Secondary | ICD-10-CM | POA: Insufficient documentation

## 2024-01-30 NOTE — Therapy (Signed)
 OUTPATIENT PHYSICAL THERAPY THORACOLUMBAR TREATMENT     Patient Name: Margaret Reese Self MRN: [id number] DOB:12-23-1945, 78 y.o., female Today's Date: 01/30/2024  END OF SESSION:  PT End of Session - 01/30/24 0914     Visit Number 28    Date for Recertification  02/09/24    Authorization Type Medicare    PT Start Time 0915    PT Stop Time 1000    PT Time Calculation (min) 45 min            Past Medical History:  Diagnosis Date   Allergy    Anxiety    Arthritis    Asthma    Complication of anesthesia    pt states has difficulty awakening; also has increased sinus drainage   Constipation    Edema, lower extremity    Encounter for interrogation of cardiac pacemaker 09/05/2018   Falls    GERD (gastroesophageal reflux disease)    H/O back injury    History of bronchitis    History of colon polyps    Hypertension    Imbalance    Kidney cysts    pt states not sure which kidney does see kidney specialist yearly pt states every thing okay currently    Legally blind in right eye, as defined in [place]     Mobitz type II atrioventricular block 11/08/2016   Multiple gastric ulcers    Obesity    OSA (obstructive sleep apnea) 05/03/2022   Pacemaker S/P St Jude Medical [employer]urity MRI model [id number] 05/20/2016   Renal stone    Retinal vein occlusion (HCC)    Shortness of breath    Sinus node dysfunction (HCC) 12/16/2018   Tingling    left arm    Trace cataracts    Urinary frequency    Vitamin D  deficiency    Past Surgical History:  Procedure Laterality Date   CARDIOVASCULAR STRESS TEST  dec 2016   CHOLECYSTECTOMY     COLONOSCOPY  2011   EYE SURGERY Bilateral    Cataract surgery.    LEFT HEART CATH AND CORONARY ANGIOGRAPHY N/A 05/19/2016   Procedure: Left Heart Cath and Coronary Angiography;  Surgeon: Salena Negri, MD;  Location: [facility];  Service: Cardiovascular;  Laterality: N/A;   PACEMAKER IMPLANT N/A 05/20/2016   Procedure: Pacemaker Implant;  Surgeon: Will Gladis Norton, MD;  Location: [facility];  Service: Cardiovascular;  Laterality: N/A;   TONSILLECTOMY     TOTAL HIP ARTHROPLASTY Right 01/30/2015   Procedure: RIGHT TOTAL HIP ARTHROPLASTY ANTERIOR APPROACH AND REMOVAL LIPOMA RIGHT HIP;  Surgeon: Lonni CINDERELLA Poli, MD;  Location: [facility];  Service: Orthopedics;  Laterality: Right;   UPPER GI ENDOSCOPY     Patient Active Problem List   Diagnosis Date Noted   Back pain 12/26/2022   Fluid retention in legs 11/13/2022   Stress 06/14/2022   Obesity (HCC)- Start BMI 40.06 05/12/2022   OSA (obstructive sleep apnea) 05/03/2022   Hyperglycemia 03/15/2022   Heart block AV complete (HCC) 01/05/2022   Pulmonary hypertension (HCC) 11/19/2021   Chronic diastolic (congestive) heart failure (HCC) 06/01/2020   Pacemaker S/P St Jude Medical Assurity MRI model [id number] 05/20/2016   History of total right hip arthroplasty 01/30/2015   Arthritis 09/11/2014   Renal cyst 09/04/2014   Allergic rhinitis due to pollen 09/04/2014   Essential hypertension 07/08/2010   GERD (gastroesophageal reflux disease) 07/08/2010   Calcium oxalate renal stones 07/08/2010   Hyperlipidemia 07/08/2010    PCP: Norleen  Joyce  REFERRING PROVIDER: Lonni Poli  REFERRING DIAG:  [phone] (ICD-10-CM) - History of total right hip arthroplasty  M54.41,G89.29 (ICD-10-CM) - Chronic right-sided low back pain with right-sided sciatica    Rationale for Evaluation and Treatment: Rehabilitation  THERAPY DIAG:  Radiculopathy, lumbosacral region  Bilateral hip pain  Muscle weakness (generalized)  ONSET DATE: 08/16/23 referral  SUBJECTIVE:                                                                                                                                                                                           SUBJECTIVE STATEMENT:   been very busy with appts for myself and husband. Pain in RT knee now, medially. Left knee still an issue but tolerable.  I  feel like I know have beter tools to help myself  PERTINENT HISTORY:  Patient is a 78 year old female who is almost 9 years out from a right total hip arthroplasty.  She comes in today ambulating with a cane.  She has been having a hard time getting up from a seated position and up from the floor when she is down on the floor.  She reports sciatic pain on the right side and also some left lower extremity and right lower extremity radicular types of symptoms.  She is not a diabetic.  She takes anti-inflammatories over-the-counter on a rare basis.  She denies any change in bowel or bladder function.   On exam both hips move smoothly and fluidly with no blocks or rotation.  Most of her pain seems to be with positive straight leg raise on both sides more so on the right than the left but she does have left lower extremity radicular symptoms as well.  PAIN:  Are you having pain knees 2/10  PRECAUTIONS: None  RED FLAGS: None   WEIGHT BEARING RESTRICTIONS: No  FALLS:  Has patient fallen in last 6 months? No  LIVING ENVIRONMENT: Lives with: lives with their spouse Lives in: House/apartment Stairs: Yes: External: 5 steps; bilateral but cannot reach both Has following equipment at home: Single point cane and Walker - 2 wheeled  PLOF: Independent and Independent with basic ADLs  PATIENT GOALS: to not hurt and I don't want to risk falling  NEXT MD VISIT: 09/20/23  OBJECTIVE:  Note: Objective measures were completed at Evaluation unless otherwise noted.  DIAGNOSTIC FINDINGS:  2 views of the lumbar spine show a significant grade 1 spondylolisthesis at L4-L5 with some degenerative changes as well. An AP pelvis and lateral of the right hip shows a well-seated right total hip arthroplasty. There is moderate arthritis of the left hip.    COGNITION:  Overall cognitive status: Within functional limits for tasks assessed     SENSATION: WFL   POSTURE: rounded shoulders and forward  head  PALPATION: Some tenderness in L2-L5  LUMBAR ROM:   AROM eval 09/28/23 10/03/23 10/20/23 01/12/24  Flexion Can touch toes, painful coming back up [facility] WFLS WFLs  Extension 25% with pain 25# with pain Limited 75% Limited 25% WFLS  Right lateral flexion Mid thigh with pain Limited 25% Limited 25% Limited 25% WFLs  Left lateral flexion Fibular head no pain Limited 25% Limited 25% WFLs WFLS  Right rotation 25% [facility] WFL  WFLS  Left rotation 50% WFL WFL  WFLS   (Blank rows = not tested)  LOWER EXTREMITY ROM:   grossly within functional ranges   LOWER EXTREMITY MMT:  grossly 5/5, some pain with resisted knee extension on RLE, hip extension 4/5  LUMBAR SPECIAL TESTS:  Straight leg raise test: Positive and FABER test: Positive  FUNCTIONAL TESTS:  5 times sit to stand: 10.39s Timed up and go (TUG): 15.53s w/o cane  GAIT: Distance walked: in clinic distances Assistive device utilized: Single point cane Level of assistance: Complete Independence  TREATMENT DATE:  01/30/24 Nustep L 6 10 min 10# knee ext 2 sets 12 Leg press 30# 10 x each toes up, toes ER then IR STS with wt ball press 10 x Modified wt ball seated abdominals 2 sets 10 Clams green tband 2 sets 10  Marching 2 sets 10 green tband Lantern tape medial RT knee for pain/swelling  01/16/24 Nustep L 6 10 min Reviewed HEP most important to do standing hip 3 way kicks, LAQ with ball squeeze and step ups Fitter 2 blue 3 way 15 x BIL 6# farmer carry 1 lap each hand Resisted gait fwd and laterally with step over STS with ADD ball squeeze 10x STS with tband ABD 10 x   01/12/24 Goals assessed and documented for renewal Standing stepping 2 min with UE support Fitter standing 3 way BIL 15 x each- UE support 2 blue STW to left lateral thigh,less tightness and tender laterally but more quad tightness. Very tender over GT so added ionto 1.2 cc dex 4 hour patch  01/09/24 Nustep L 6 10 min Stepping laterally over 6 inch  box 10 x with UE support- CGA Resisted stepping for strength and balance  fwd and laterally CGA 3# step taps 8 inch from 1 foot out 20 x 2 x 3# single HHA high knee marching 25 feet fwd and walking backward 2 x eahc 3# vector taps  01/04/24 Nustep L 5 10 min LE only 30# resisted gait 5 x fwd and back, then 3 x each side Sit to stand on airex 10 x CGA- no LOB Vector taps on airex CGA, only 1 LOB that required assist Obstacles course stepping on/over and around- CGA with cuing to get closer to obj and step over vs swing Fitter 1 blue standing hip flex,ext and abd 15 x each CGA Leg press 30# 2 sets10 Calf raises 30# 2 sets 10   01/02/24 Nustep L 5 8 min LE only 20# side stepping over 2 ( 1 inch) sticks 5 x each side CG-min A 3# more square stepping fwd and SW CG-min A 3# HHA marching fwd and backward 25 feet 3# side stepping 10 feet 4 x each Fitter 1 blue standing hip flex,ext and abd 15 x each CGA   12/29/23 Answered all pnt questions at length re: MD appt. Helping her to understand  everything better and how how entire Lower quadrep works together. Addressed questions re: knee brace and stated if tape helps okay to stick with it Information given about [employer] for orthotics an dincerts Updated HEP Nustep L 5 10 min  12/21/23 Taped left knee for medial pull and educ pt and husband on taping 3# SLR 2 sets 10 3 # SLR with ER 2 sets 10 3# KTC 2 sets 10 3# IR and ER 2 sets 10 Nustep L 5 Resisted gait Stepping actvities  Faom mat for balance  12/15/23 BERG 50/56 Nustep L 5 More Square- balance and stepping activities Vector taps with green tband 10 x BIL Side stepping green tband 10 feet 5 x each Standing on BIL dyna disc func reaching and mini squats 6 inch alt step taps 20 x 2 sets KT tape to left patella to shift medially    12/14/23 Nustep L 5 8 min Step up for ADD LEFT LE 2 sets 10 with HHA with 4  inch ( 6 inch too hard/high) Foam mat side stepping  5 x in // bars Knee ext 10# 2 sets 10 with ball squeeze for VMO HS curl 25# 2 sets 10 with ball squeeze Trunk flex and ext with resistance 20 x with VMO ball squeeze     12/08/23 Nustep L 5 BERG 50/56 LAQ with ball squeeze 3# 2 sets 10 SAQ with ball squeeze 3# 2 sets 10 SLR with ER 3# 2 sets 10 3# side stepping 10 feet 3 x SL ADD 3# 10 x KT tape for laterally shifted left patella   12/01/23 Nustep L 5 Tandem gait on foam beam 5 x minimal UE support Side stepping on foam mat 5 x each way min UE support More square L 2 stepping fwd and SW STS 10 x from standard chair 10 x BIL LE And trunk PROM with STW to left ITB     11/14/23 BERG 47/56 Nustep L 5 6 inch lateral step across min a with cuing to keep feet fwd 10 x Red tband seated IR/ER 15 x each Red tband side stepping on foam beam no hands CGA 3 x Tandem on beam no hands 2 x CGA Leg Press 30# 3 sets 10 feet 3 ways for hip Navigating various height steps no AD CGA       09/05/23 EVAL                                                                                                                                PATIENT EDUCATION:  Education details: POC, HEP, sciatica, hip flexor tightness sleeping in recliners Person educated: Patient Education method: Explanation and Demonstration Education comprehension: verbalized understanding and returned demonstration  HOME EXERCISE PROGRAM:  12/29/23 adjusted HEP-Access Code: [email or web address] URL: [email or web address] Date: 12/29/2023 Prepared by: Plez Belton  Exercises - Seated Long Arc Quad  - 3 x daily - 7 x weekly -  1 sets - 15 reps - 3-5 hold - Standing Hip Flexion with Resistance at Ankles and Counter Support  - 1-2 x daily - 7 x weekly - 2 sets - 10 reps - Standing Hip Abduction with Resistance at Ankles and Unilateral Counter Support  - 1-2 x daily - 7 x weekly - 2 sets - 10 reps - Standing Hip Extension with Resistance at Ankles and  Unilateral Counter Support  - 1-2 x daily - 7 x weekly - 2 sets - 10 reps  Added to HEP. LAQ with ball squeeze, SL hip ADD and SLR with ER Access Code: [email or web address] URL: [email or web address] Date: 10/03/2023 Prepared by: Izzy Doubek  Exercises - Supine Bridge  - 1 x daily - 7 x weekly - 1 sets - 10 reps - 3 hold - Supine Hip Adduction Isometric with Ball  - 1 x daily - 7 x weekly - 2 sets - 10 reps - Supine March  - 1 x daily - 7 x weekly - 2 sets - 10 reps - Hooklying Clamshell with Resistance  - 1 x daily - 7 x weekly - 2 sets - 10 reps - Supine Straight Leg Raises  - 1 x daily - 7 x weekly - 3 sets - 10 reps - Seated Long Arc Quad  - 1 x daily - 7 x weekly - 2 sets - 10 reps - Seated March  - 1 x daily - 7 x weekly - 2 sets - 10 reps - Seated Hip Flexion and External Rotation  - 1 x daily - 7 x weekly - 2 sets - 10 reps - Standing March with Counter Support  - 1 x daily - 7 x weekly - 1 sets - 10 reps - Standing Hip Abduction with Counter Support  - 1 x daily - 7 x weekly - 1 sets - 10 reps - Standing Hip Extension with Counter Support  - 1 x daily - 7 x weekly - 1 sets - 10 reps Access Code: [email or web address] URL: [email or web address] Date: 09/05/2023 Prepared by: Almetta Fam  Exercises - Supine Lower Trunk Rotation  - 1 x daily - 7 x weekly - 2 sets - 10 reps - Supine Bridge  - 1 x daily - 7 x weekly - 2 sets - 10 reps - Supine Figure 4 Piriformis Stretch  - 1 x daily - 7 x weekly - 2 reps - 15 hold - Modified Thomas Stretch  - 1 x daily - 7 x weekly - 2 reps - 15 hold  ASSESSMENT:  CLINICAL IMPRESSION:  been very busy with appts for myself and husband. Pain in RT knee now, medially. Left knee still an issue but tolerable.  I feel like I know have beter tools to help myself showed pain taping for RT medial knee. Pnt motivated to start to wrap up therapy and work more at gym and HEPs. Goals assessed and progressing.   OBJECTIVE IMPAIRMENTS: Abnormal gait,  decreased activity tolerance, decreased balance, decreased ROM, decreased strength, and pain.     ACTIVITY LIMITATIONS: bending, squatting, stairs, and locomotion level  PARTICIPATION LIMITATIONS: cleaning, laundry, driving, shopping, community activity, and yard work  PERSONAL FACTORS: Age, Past/current experiences, Time since onset of injury/illness/exacerbation, and 1-2 comorbidities: arthritis, hx of surgery are also affecting patient's functional outcome.   REHAB POTENTIAL: Good  CLINICAL DECISION MAKING: Stable/uncomplicated  EVALUATION COMPLEXITY: Low  GOALS: Goals reviewed with patient? Yes  SHORT TERM GOALS: Target date: 10/10/23  Patient will be independent with  initial HEP.  Baseline:  Goal status: 09/14/23 progressing  09/28/23 MET  2.  Patient will report centralization of radicular symptoms.  Baseline: pain in both legs Goal status: 09/28/23 no changes   10/03/23 progressing   MET 10/20/23   LONG TERM GOALS: Target date: 02/09/24  Patient will be independent with advanced/ongoing HEP to improve outcomes and carryover.  Baseline:  Goal status: MET 10/20/23 btwn HEP and ex class  2.  Patient will report 50-75% improvement in low back pain to improve QOL.  Baseline: 5/10 Goal status: 10/03/23 progressing  10/20/23 40% progressing and 10/24/23 10/31/23 progressing   11/14/23 progressing  12/15/23 progressing 12/21/23 progressing  and 12/29/23 01/12/24 overall 80-85% better MET  3.  Patient will demonstrate full pain free lumbar ROM to perform ADLs.   Baseline: see chart Goal status: 10/03/23 progressing  and 10/20/23  and 10/24/23. 9/9 WFL except ext limited 50%  12/01/23 WFLs ext limited 75%  12/15/23 WFL minus extension limited 75%  and 12/21/23 01/12/24  WFLs including ext MET  4.  Patient will be up and down a set of stairs without pain.  Baseline: pain going up and down Goal status: ongoing 10/03/23  progressing 10/20/23  and 10/24/23  and 11/10/23  and 11/14/23  12/15/23 varies  but overall reports much better  01/04/24 MET  5.  Patient will be able to sleep in her bed again for at least 3-4 hrs.  Baseline: 1 hr tolerance  Goal status: 10/03/23 progressing  and 10/20/23. 10/31/23 progressing slowly- hard to get comfortable  progressing 11/10/23  12/01/23 on going  and 12/15/23  01/04/24 still an ongoing issue- mostly back 01/12/24 on going 01/30/24 progressing  6.  Patient will tolerate 30 min of standing to perform ADLs and household chores Baseline: ~10 mins Goal status: 10/03/23 progressing 10/20/23 progressing 15-20  . MET 10/24/23 pt stated stated did 30-45 min  7.  Goal added 11/14/23 Will increase Berg to 52/56 for increased safety and function to reduce risk for falls. Baseline:  11/14/23 47/56  12/01/23 progressing   12/15/23 50/56 progressing  and 01/04/24   01/11/24 progressing 50/56  and 01/26/24  8.  Increase Gait to be able to walk 10 minutes without assistive device.  12/01/23 progressing  and 12/29/23 01/11/24 progressing and 01/30/24  PLAN:  PT FREQUENCY: 2x/week  PT DURATION: 10 weeks  PLANNED INTERVENTIONS: 97110-Therapeutic exercises, 97530- Therapeutic activity, 97112- Neuromuscular re-education, 97535- Self Care, 02859- Manual therapy, [phone]- Gait training, [phone]- Electrical stimulation (unattended), 97016- Vasopneumatic device, [id number]- Traction (mechanical), [id number]- Ionotophoresis 4mg /ml Dexamethasone , 79439 (1-2 muscles), 20561 (3+ muscles)- Dry Needling, Patient/Family education, Balance training, Stair training, Taping, Joint mobilization, Spinal mobilization, Cryotherapy, and Moist heat.  PLAN FOR NEXT SESSION  progress and work towards independent ex at home and ex class. Progress towards D/C  Jon Marrell Dicaprio PTA 01/30/24 9:15 AM

## 2024-01-31 NOTE — Telephone Encounter (Signed)
Let message for pt to call the office.

## 2024-02-02 ENCOUNTER — Ambulatory Visit: Admitting: Physical Therapy

## 2024-02-02 DIAGNOSIS — M25551 Pain in right hip: Secondary | ICD-10-CM

## 2024-02-02 DIAGNOSIS — M6281 Muscle weakness (generalized): Secondary | ICD-10-CM

## 2024-02-02 DIAGNOSIS — M5417 Radiculopathy, lumbosacral region: Secondary | ICD-10-CM | POA: Diagnosis not present

## 2024-02-02 NOTE — Therapy (Signed)
 OUTPATIENT PHYSICAL THERAPY THORACOLUMBAR TREATMENT     Patient Name: Margaret Reese MRN: [id number] DOB:1945/04/12, 78 y.o., female Today's Date: 02/02/2024  END OF SESSION:  PT End of Session - 02/02/24 0924     Visit Number 29    Date for Recertification  02/09/24    Authorization Type Medicare    PT Start Time 0925    PT Stop Time 0955    PT Time Calculation (min) 30 min            Past Medical History:  Diagnosis Date   Allergy    Anxiety    Arthritis    Asthma    Complication of anesthesia    pt states has difficulty awakening; also has increased sinus drainage   Constipation    Edema, lower extremity    Encounter for interrogation of cardiac pacemaker 09/05/2018   Falls    GERD (gastroesophageal reflux disease)    H/O back injury    History of bronchitis    History of colon polyps    Hypertension    Imbalance    Kidney cysts    pt states not sure which kidney does see kidney specialist yearly pt states every thing okay currently    Legally blind in right eye, as defined in [place]     Mobitz type II atrioventricular block 11/08/2016   Multiple gastric ulcers    Obesity    OSA (obstructive sleep apnea) 05/03/2022   Pacemaker S/P St Jude Medical Assurity MRI model [id number] 05/20/2016   Renal stone    Retinal vein occlusion (HCC)    Shortness of breath    Sinus node dysfunction (HCC) 12/16/2018   Tingling    left arm    Trace cataracts    Urinary frequency    Vitamin D  deficiency    Past Surgical History:  Procedure Laterality Date   CARDIOVASCULAR STRESS TEST  Reese 2016   CHOLECYSTECTOMY     COLONOSCOPY  2011   EYE SURGERY Bilateral    Cataract surgery.    LEFT HEART CATH AND CORONARY ANGIOGRAPHY N/A 05/19/2016   Procedure: Left Heart Cath and Coronary Angiography;  Surgeon: Salena Negri, MD;  Location: [facility];  Service: Cardiovascular;  Laterality: N/A;   PACEMAKER IMPLANT N/A 05/20/2016   Procedure: Pacemaker Implant;  Surgeon: Will  Gladis Norton, MD;  Location: [facility];  Service: Cardiovascular;  Laterality: N/A;   TONSILLECTOMY     TOTAL HIP ARTHROPLASTY Right 01/30/2015   Procedure: RIGHT TOTAL HIP ARTHROPLASTY ANTERIOR APPROACH AND REMOVAL LIPOMA RIGHT HIP;  Surgeon: Lonni CINDERELLA Poli, MD;  Location: [facility];  Service: Orthopedics;  Laterality: Right;   UPPER GI ENDOSCOPY     Patient Active Problem List   Diagnosis Date Noted   Back pain 12/26/2022   Fluid retention in legs 11/13/2022   Stress 06/14/2022   Obesity (HCC)- Start BMI 40.06 05/12/2022   OSA (obstructive sleep apnea) 05/03/2022   Hyperglycemia 03/15/2022   Heart block AV complete (HCC) 01/05/2022   Pulmonary hypertension (HCC) 11/19/2021   Chronic diastolic (congestive) heart failure (HCC) 06/01/2020   Pacemaker S/P St Jude Medical Assurity MRI model [id number] 05/20/2016   History of total right hip arthroplasty 01/30/2015   Arthritis 09/11/2014   Renal cyst 09/04/2014   Allergic rhinitis due to pollen 09/04/2014   Essential hypertension 07/08/2010   GERD (gastroesophageal reflux disease) 07/08/2010   Calcium oxalate renal stones 07/08/2010   Hyperlipidemia 07/08/2010    PCP: Norleen  Joyce  REFERRING PROVIDER: Lonni Poli  REFERRING DIAG:  [id number] (ICD-10-CM) - History of total right hip arthroplasty  M54.41,G89.29 (ICD-10-CM) - Chronic right-sided low back pain with right-sided sciatica    Rationale for Evaluation and Treatment: Rehabilitation  THERAPY DIAG:  Radiculopathy, lumbosacral region  Bilateral hip pain  Muscle weakness (generalized)  ONSET DATE: 08/16/23 referral  SUBJECTIVE:                                                                                                                                                                                           SUBJECTIVE STATEMENT:   doing better. Walking better PERTINENT HISTORY:  Patient is a 78 year old female who is almost 9 years out from a  right total hip arthroplasty.  She comes in today ambulating with a cane.  She has been having a hard time getting up from a seated position and up from the floor when she is down on the floor.  She reports sciatic pain on the right side and also some left lower extremity and right lower extremity radicular types of symptoms.  She is not a diabetic.  She takes anti-inflammatories over-the-counter on a rare basis.  She denies any change in bowel or bladder function.   On exam both hips move smoothly and fluidly with no blocks or rotation.  Most of her pain seems to be with positive straight leg raise on both sides more so on the right than the left but she does have left lower extremity radicular symptoms as well.  PAIN:  Are you having pain knees 2/10  PRECAUTIONS: None  RED FLAGS: None   WEIGHT BEARING RESTRICTIONS: No  FALLS:  Has patient fallen in last 6 months? No  LIVING ENVIRONMENT: Lives with: lives with their spouse Lives in: House/apartment Stairs: Yes: External: 5 steps; bilateral but cannot reach both Has following equipment at home: Single point cane and Walker - 2 wheeled  PLOF: Independent and Independent with basic ADLs  PATIENT GOALS: to not hurt and I don't want to risk falling  NEXT MD VISIT: 09/20/23  OBJECTIVE:  Note: Objective measures were completed at Evaluation unless otherwise noted.  DIAGNOSTIC FINDINGS:  2 views of the lumbar spine show a significant grade 1 spondylolisthesis at L4-L5 with some degenerative changes as well. An AP pelvis and lateral of the right hip shows a well-seated right total hip arthroplasty. There is moderate arthritis of the left hip.    COGNITION: Overall cognitive status: Within functional limits for tasks assessed     SENSATION: WFL   POSTURE: rounded shoulders and forward head  PALPATION: Some tenderness in L2-L5  LUMBAR  ROM:   AROM eval 09/28/23 10/03/23 10/20/23 01/12/24  Flexion Can touch toes, painful coming  back up [facility] WFLS WFLs  Extension 25% with pain 25# with pain Limited 75% Limited 25% WFLS  Right lateral flexion Mid thigh with pain Limited 25% Limited 25% Limited 25% WFLs  Left lateral flexion Fibular head no pain Limited 25% Limited 25% WFLs WFLS  Right rotation 25% [facility] WFL  WFLS  Left rotation 50% WFL WFL  WFLS   (Blank rows = not tested)  LOWER EXTREMITY ROM:   grossly within functional ranges   LOWER EXTREMITY MMT:  grossly 5/5, some pain with resisted knee extension on RLE, hip extension 4/5  LUMBAR SPECIAL TESTS:  Straight leg raise test: Positive and FABER test: Positive  FUNCTIONAL TESTS:  5 times sit to stand: 10.39s Timed up and go (TUG): 15.53s w/o cane  GAIT: Distance walked: in clinic distances Assistive device utilized: Single point cane Level of assistance: Complete Independence  TREATMENT DATE:   02/02/24 Nustep L 6 Progressed stairs Step over Step 4 in and 6 in  Up and down stairs latrally for strength with rails STS on foam mat with ball toss Floor ladder step over step in each rung -Sup Side stepping on floor ladder- Sup 30# resisted gait 5 x 4 ways - Sup     01/30/24 Nustep L 6 10 min 10# knee ext 2 sets 12 Leg press 30# 10 x each toes up, toes ER then IR STS with wt ball press 10 x Modified wt ball seated abdominals 2 sets 10 Clams green tband 2 sets 10  Marching 2 sets 10 green tband Lantern tape medial RT knee for pain/swelling  01/16/24 Nustep L 6 10 min Reviewed HEP most important to do standing hip 3 way kicks, LAQ with ball squeeze and step ups Fitter 2 blue 3 way 15 x BIL 6# farmer carry 1 lap each hand Resisted gait fwd and laterally with step over STS with ADD ball squeeze 10x STS with tband ABD 10 x   01/12/24 Goals assessed and documented for renewal Standing stepping 2 min with UE support Fitter standing 3 way BIL 15 x each- UE support 2 blue STW to left lateral thigh,less tightness and tender laterally but  more quad tightness. Very tender over GT so added ionto 1.2 cc dex 4 hour patch  01/09/24 Nustep L 6 10 min Stepping laterally over 6 inch box 10 x with UE support- CGA Resisted stepping for strength and balance  fwd and laterally CGA 3# step taps 8 inch from 1 foot out 20 x 2 x 3# single HHA high knee marching 25 feet fwd and walking backward 2 x eahc 3# vector taps  01/04/24 Nustep L 5 10 min LE only 30# resisted gait 5 x fwd and back, then 3 x each side Sit to stand on airex 10 x CGA- no LOB Vector taps on airex CGA, only 1 LOB that required assist Obstacles course stepping on/over and around- CGA with cuing to get closer to obj and step over vs swing Fitter 1 blue standing hip flex,ext and abd 15 x each CGA Leg press 30# 2 sets10 Calf raises 30# 2 sets 10   01/02/24 Nustep L 5 8 min LE only 20# side stepping over 2 ( 1 inch) sticks 5 x each side CG-min A 3# more square stepping fwd and SW CG-min A 3# HHA marching fwd and backward 25 feet 3# side stepping 10 feet 4  x each Fitter 1 blue standing hip flex,ext and abd 15 x each CGA   12/29/23 Answered all pnt questions at length re: MD appt. Helping her to understand everything better and how how entire Lower quadrep works together. Addressed questions re: knee brace and stated if tape helps okay to stick with it Information given about [employer] for orthotics an dincerts Updated HEP Nustep L 5 10 min  12/21/23 Taped left knee for medial pull and educ pt and husband on taping 3# SLR 2 sets 10 3 # SLR with ER 2 sets 10 3# KTC 2 sets 10 3# IR and ER 2 sets 10 Nustep L 5 Resisted gait Stepping actvities  Faom mat for balance  12/15/23 BERG 50/56 Nustep L 5 More Square- balance and stepping activities Vector taps with green tband 10 x BIL Side stepping green tband 10 feet 5 x each Standing on BIL dyna disc func reaching and mini squats 6 inch alt step taps 20 x 2 sets KT tape to left patella to shift  medially    12/14/23 Nustep L 5 8 min Step up for ADD LEFT LE 2 sets 10 with HHA with 4  inch ( 6 inch too hard/high) Foam mat side stepping 5 x in // bars Knee ext 10# 2 sets 10 with ball squeeze for VMO HS curl 25# 2 sets 10 with ball squeeze Trunk flex and ext with resistance 20 x with VMO ball squeeze     12/08/23 Nustep L 5 BERG 50/56 LAQ with ball squeeze 3# 2 sets 10 SAQ with ball squeeze 3# 2 sets 10 SLR with ER 3# 2 sets 10 3# side stepping 10 feet 3 x SL ADD 3# 10 x KT tape for laterally shifted left patella   12/01/23 Nustep L 5 Tandem gait on foam beam 5 x minimal UE support Side stepping on foam mat 5 x each way min UE support More square L 2 stepping fwd and SW STS 10 x from standard chair 10 x BIL LE And trunk PROM with STW to left ITB     11/14/23 BERG 47/56 Nustep L 5 6 inch lateral step across min a with cuing to keep feet fwd 10 x Red tband seated IR/ER 15 x each Red tband side stepping on foam beam no hands CGA 3 x Tandem on beam no hands 2 x CGA Leg Press 30# 3 sets 10 feet 3 ways for hip Navigating various height steps no AD CGA       09/05/23 EVAL                                                                                                                                PATIENT EDUCATION:  Education details: POC, HEP, sciatica, hip flexor tightness sleeping in recliners Person educated: Patient Education method: Medical Illustrator Education comprehension: verbalized understanding and returned demonstration  HOME EXERCISE PROGRAM:  12/29/23 adjusted HEP-Access Code: [email or web address] URL: [email or web address] Date: 12/29/2023 Prepared by: Jon Promiss Labarbera  Exercises - Seated Long Arc Quad  - 3 x daily - 7 x weekly - 1 sets - 15 reps - 3-5 hold - Standing Hip Flexion with Resistance at Ankles and Counter Support  - 1-2 x daily - 7 x weekly - 2 sets - 10 reps - Standing Hip Abduction with Resistance  at Ankles and Unilateral Counter Support  - 1-2 x daily - 7 x weekly - 2 sets - 10 reps - Standing Hip Extension with Resistance at Ankles and Unilateral Counter Support  - 1-2 x daily - 7 x weekly - 2 sets - 10 reps  Added to HEP. LAQ with ball squeeze, SL hip ADD and SLR with ER Access Code: [email or web address] URL: [email or web address] Date: 10/03/2023 Prepared by: Haitham Dolinsky  Exercises - Supine Bridge  - 1 x daily - 7 x weekly - 1 sets - 10 reps - 3 hold - Supine Hip Adduction Isometric with Ball  - 1 x daily - 7 x weekly - 2 sets - 10 reps - Supine March  - 1 x daily - 7 x weekly - 2 sets - 10 reps - Hooklying Clamshell with Resistance  - 1 x daily - 7 x weekly - 2 sets - 10 reps - Supine Straight Leg Raises  - 1 x daily - 7 x weekly - 3 sets - 10 reps - Seated Long Arc Quad  - 1 x daily - 7 x weekly - 2 sets - 10 reps - Seated March  - 1 x daily - 7 x weekly - 2 sets - 10 reps - Seated Hip Flexion and External Rotation  - 1 x daily - 7 x weekly - 2 sets - 10 reps - Standing March with Counter Support  - 1 x daily - 7 x weekly - 1 sets - 10 reps - Standing Hip Abduction with Counter Support  - 1 x daily - 7 x weekly - 1 sets - 10 reps - Standing Hip Extension with Counter Support  - 1 x daily - 7 x weekly - 1 sets - 10 reps Access Code: [email or web address] URL: [email or web address] Date: 09/05/2023 Prepared by: Almetta Fam  Exercises - Supine Lower Trunk Rotation  - 1 x daily - 7 x weekly - 2 sets - 10 reps - Supine Bridge  - 1 x daily - 7 x weekly - 2 sets - 10 reps - Supine Figure 4 Piriformis Stretch  - 1 x daily - 7 x weekly - 2 reps - 15 hold - Modified Thomas Stretch  - 1 x daily - 7 x weekly - 2 reps - 15 hold  ASSESSMENT:  CLINICAL IMPRESSION:  pnt arrived feeling better and walking better, but tired as she did not sleep much last night. Tolerated all interventions very well with good balance and control, some moments of instability but regained without  help. Pnt is progressing well with remaining interventions and working towards D/C at end of cert period. Shortened session d/t fatigue . OBJECTIVE IMPAIRMENTS: Abnormal gait, decreased activity tolerance, decreased balance, decreased ROM, decreased strength, and pain.     ACTIVITY LIMITATIONS: bending, squatting, stairs, and locomotion level  PARTICIPATION LIMITATIONS: cleaning, laundry, driving, shopping, community activity, and yard work  PERSONAL FACTORS: Age, Past/current experiences, Time since onset of injury/illness/exacerbation, and 1-2 comorbidities: arthritis, hx of surgery are also affecting patient's functional outcome.   REHAB POTENTIAL: Good  CLINICAL DECISION MAKING: Stable/uncomplicated  EVALUATION COMPLEXITY: Low  GOALS: Goals reviewed with patient? Yes  SHORT TERM GOALS: Target date: 10/10/23  Patient will be independent with initial HEP.  Baseline:  Goal status: 09/14/23 progressing  09/28/23 MET  2.  Patient will report centralization of radicular symptoms.  Baseline: pain in both legs Goal status: 09/28/23 no changes   10/03/23 progressing   MET 10/20/23   LONG TERM GOALS: Target date: 02/09/24  Patient will be independent with advanced/ongoing HEP to improve outcomes and carryover.  Baseline:  Goal status: MET 10/20/23 btwn HEP and ex class  2.  Patient will report 50-75% improvement in low back pain to improve QOL.  Baseline: 5/10 Goal status: 10/03/23 progressing  10/20/23 40% progressing and 10/24/23 10/31/23 progressing   11/14/23 progressing  12/15/23 progressing 12/21/23 progressing  and 12/29/23 01/12/24 overall 80-85% better MET  3.  Patient will demonstrate full pain free lumbar ROM to perform ADLs.   Baseline: see chart Goal status: 10/03/23 progressing  and 10/20/23  and 10/24/23. 9/9 WFL except ext limited 50%  12/01/23 WFLs ext limited 75%  12/15/23 WFL minus extension limited 75%  and 12/21/23 01/12/24  WFLs including ext MET  4.  Patient will be up and  down a set of stairs without pain.  Baseline: pain going up and down Goal status: ongoing 10/03/23  progressing 10/20/23  and 10/24/23  and 11/10/23  and 11/14/23  12/15/23 varies but overall reports much better  01/04/24 MET  5.  Patient will be able to sleep in her bed again for at least 3-4 hrs.  Baseline: 1 hr tolerance  Goal status: 10/03/23 progressing  and 10/20/23. 10/31/23 progressing slowly- hard to get comfortable  progressing 11/10/23  12/01/23 on going  and 12/15/23  01/04/24 still an ongoing issue- mostly back 01/12/24 on going 01/30/24 progressing   6.  Patient will tolerate 30 min of standing to perform ADLs and household chores Baseline: ~10 mins Goal status: 10/03/23 progressing 10/20/23 progressing 15-20  . MET 10/24/23 pt stated stated did 30-45 min  7.  Goal added 11/14/23 Will increase Berg to 52/56 for increased safety and function to reduce risk for falls. Baseline:  11/14/23 47/56  12/01/23 progressing   12/15/23 50/56 progressing  and 01/04/24   01/11/24 progressing 50/56  and 01/26/24  8.  Increase Gait to be able to walk 10 minutes without assistive device.  12/01/23 progressing  and 12/29/23 01/11/24 progressing and 01/30/24  PLAN:  PT FREQUENCY: 2x/week  PT DURATION: 10 weeks  PLANNED INTERVENTIONS: 97110-Therapeutic exercises, 97530- Therapeutic activity, 97112- Neuromuscular re-education, 97535- Self Care, 02859- Manual therapy, [phone]- Gait training, [phone]- Electrical stimulation (unattended), 97016- Vasopneumatic device, [id number]- Traction (mechanical), [id number]- Ionotophoresis 4mg /ml Dexamethasone , 79439 (1-2 muscles), 20561 (3+ muscles)- Dry Needling, Patient/Family education, Balance training, Stair training, Taping, Joint mobilization, Spinal mobilization, Cryotherapy, and Moist heat.  PLAN FOR NEXT SESSION  progress and work towards independent ex at home and ex class. Progress towards D/C  Jon Samuell Knoble PTA 02/02/2024 9:55 AM

## 2024-02-06 ENCOUNTER — Ambulatory Visit: Admitting: Physical Therapy

## 2024-02-06 DIAGNOSIS — M25551 Pain in right hip: Secondary | ICD-10-CM

## 2024-02-06 DIAGNOSIS — M6281 Muscle weakness (generalized): Secondary | ICD-10-CM

## 2024-02-06 DIAGNOSIS — M5417 Radiculopathy, lumbosacral region: Secondary | ICD-10-CM | POA: Diagnosis not present

## 2024-02-06 NOTE — Therapy (Signed)
 OUTPATIENT PHYSICAL THERAPY THORACOLUMBAR TREATMENT  Discharge Summary   Patient Name: Margaret Reese MRN: [id number] DOB:06/04/1945, 78 y.o., female Today's Date: 02/06/2024  END OF SESSION:  PT End of Session - 02/06/24 0922     Visit Number 30    Date for Recertification  02/09/24    Authorization Type Medicare    PT Start Time 0925    PT Stop Time 1005    PT Time Calculation (min) 40 min            Past Medical History:  Diagnosis Date   Allergy    Anxiety    Arthritis    Asthma    Complication of anesthesia    pt states has difficulty awakening; also has increased sinus drainage   Constipation    Edema, lower extremity    Encounter for interrogation of cardiac pacemaker 09/05/2018   Falls    GERD (gastroesophageal reflux disease)    H/O back injury    History of bronchitis    History of colon polyps    Hypertension    Imbalance    Kidney cysts    pt states not sure which kidney does see kidney specialist yearly pt states every thing okay currently    Legally blind in right eye, as defined in [place]     Mobitz type II atrioventricular block 11/08/2016   Multiple gastric ulcers    Obesity    OSA (obstructive sleep apnea) 05/03/2022   Pacemaker S/P St Jude Medical Assurity MRI model [id number] 05/20/2016   Renal stone    Retinal vein occlusion (HCC)    Shortness of breath    Sinus node dysfunction (HCC) 12/16/2018   Tingling    left arm    Trace cataracts    Urinary frequency    Vitamin D  deficiency    Past Surgical History:  Procedure Laterality Date   CARDIOVASCULAR STRESS TEST  dec 2016   CHOLECYSTECTOMY     COLONOSCOPY  2011   EYE SURGERY Bilateral    Cataract surgery.    LEFT HEART CATH AND CORONARY ANGIOGRAPHY N/A 05/19/2016   Procedure: Left Heart Cath and Coronary Angiography;  Surgeon: Salena Negri, MD;  Location: [facility];  Service: Cardiovascular;  Laterality: N/A;   PACEMAKER IMPLANT N/A 05/20/2016   Procedure: Pacemaker Implant;   Surgeon: Will Gladis Norton, MD;  Location: [facility];  Service: Cardiovascular;  Laterality: N/A;   TONSILLECTOMY     TOTAL HIP ARTHROPLASTY Right 01/30/2015   Procedure: RIGHT TOTAL HIP ARTHROPLASTY ANTERIOR APPROACH AND REMOVAL LIPOMA RIGHT HIP;  Surgeon: Lonni CINDERELLA Poli, MD;  Location: [facility];  Service: Orthopedics;  Laterality: Right;   UPPER GI ENDOSCOPY     Patient Active Problem List   Diagnosis Date Noted   Back pain 12/26/2022   Fluid retention in legs 11/13/2022   Stress 06/14/2022   Obesity (HCC)- Start BMI 40.06 05/12/2022   OSA (obstructive sleep apnea) 05/03/2022   Hyperglycemia 03/15/2022   Heart block AV complete (HCC) 01/05/2022   Pulmonary hypertension (HCC) 11/19/2021   Chronic diastolic (congestive) heart failure (HCC) 06/01/2020   Pacemaker S/P St Jude Medical Assurity MRI model [id number] 05/20/2016   History of total right hip arthroplasty 01/30/2015   Arthritis 09/11/2014   Renal cyst 09/04/2014   Allergic rhinitis due to pollen 09/04/2014   Essential hypertension 07/08/2010   GERD (gastroesophageal reflux disease) 07/08/2010   Calcium oxalate renal stones 07/08/2010   Hyperlipidemia 07/08/2010    PCP:  Norleen Jobs  REFERRING PROVIDER: Lonni Poli  REFERRING DIAG:  [phone] (ICD-10-CM) - History of total right hip arthroplasty  M54.41,G89.29 (ICD-10-CM) - Chronic right-sided low back pain with right-sided sciatica    Rationale for Evaluation and Treatment: Rehabilitation  THERAPY DIAG:  Bilateral hip pain  Muscle weakness (generalized)  ONSET DATE: 08/16/23 referral  SUBJECTIVE:                                                                                                                                                                                           SUBJECTIVE STATEMENT:   doing good. Under knee swelled over weekend with holiday party PERTINENT HISTORY:  Patient is a 78 year old female who is almost 9 years out  from a right total hip arthroplasty.  She comes in today ambulating with a cane.  She has been having a hard time getting up from a seated position and up from the floor when she is down on the floor.  She reports sciatic pain on the right side and also some left lower extremity and right lower extremity radicular types of symptoms.  She is not a diabetic.  She takes anti-inflammatories over-the-counter on a rare basis.  She denies any change in bowel or bladder function.   On exam both hips move smoothly and fluidly with no blocks or rotation.  Most of her pain seems to be with positive straight leg raise on both sides more so on the right than the left but she does have left lower extremity radicular symptoms as well.  PAIN:  Are you having pain knees 2/10  PRECAUTIONS: None  RED FLAGS: None   WEIGHT BEARING RESTRICTIONS: No  FALLS:  Has patient fallen in last 6 months? No  LIVING ENVIRONMENT: Lives with: lives with their spouse Lives in: House/apartment Stairs: Yes: External: 5 steps; bilateral but cannot reach both Has following equipment at home: Single point cane and Walker - 2 wheeled  PLOF: Independent and Independent with basic ADLs  PATIENT GOALS: to not hurt and I don't want to risk falling  NEXT MD VISIT: 09/20/23  OBJECTIVE:  Note: Objective measures were completed at Evaluation unless otherwise noted.  DIAGNOSTIC FINDINGS:  2 views of the lumbar spine show a significant grade 1 spondylolisthesis at L4-L5 with some degenerative changes as well. An AP pelvis and lateral of the right hip shows a well-seated right total hip arthroplasty. There is moderate arthritis of the left hip.    COGNITION: Overall cognitive status: Within functional limits for tasks assessed     SENSATION: WFL   POSTURE: rounded shoulders and forward head  PALPATION: Some tenderness in  L2-L5  LUMBAR ROM:   AROM eval 09/28/23 10/03/23 10/20/23 01/12/24  Flexion Can touch toes, painful  coming back up [facility] WFLS WFLs  Extension 25% with pain 25# with pain Limited 75% Limited 25% WFLS  Right lateral flexion Mid thigh with pain Limited 25% Limited 25% Limited 25% WFLs  Left lateral flexion Fibular head no pain Limited 25% Limited 25% WFLs WFLS  Right rotation 25% [facility] WFL  WFLS  Left rotation 50% WFL WFL  WFLS   (Blank rows = not tested)  LOWER EXTREMITY ROM:   grossly within functional ranges   LOWER EXTREMITY MMT:  grossly 5/5, some pain with resisted knee extension on RLE, hip extension 4/5  LUMBAR SPECIAL TESTS:  Straight leg raise test: Positive and FABER test: Positive  FUNCTIONAL TESTS:  5 times sit to stand: 10.39s Timed up and go (TUG): 15.53s w/o cane  GAIT: Distance walked: in clinic distances Assistive device utilized: Single point cane Level of assistance: Complete Independence  TREATMENT DATE:  02/06/24 BERG 52/56 Assessed goals and documented Nustep L 6 8 min LE only HS curl 25# 2 sets 12 Knee ext 10# 3 sets 10 Leg Press 30# 3 sets 10 -feet 3 way. Calf raises 2 sets 15 Step up 4 in plus airex 10 x each leg fwd and laterally    02/02/24 Nustep L 6 Progressed stairs Step over Step 4 in and 6 in  Up and down stairs latrally for strength with rails STS on foam mat with ball toss Floor ladder step over step in each rung -Sup Side stepping on floor ladder- Sup 30# resisted gait 5 x 4 ways - Sup     01/30/24 Nustep L 6 10 min 10# knee ext 2 sets 12 Leg press 30# 10 x each toes up, toes ER then IR STS with wt ball press 10 x Modified wt ball seated abdominals 2 sets 10 Clams green tband 2 sets 10  Marching 2 sets 10 green tband Lantern tape medial RT knee for pain/swelling  01/16/24 Nustep L 6 10 min Reviewed HEP most important to do standing hip 3 way kicks, LAQ with ball squeeze and step ups Fitter 2 blue 3 way 15 x BIL 6# farmer carry 1 lap each hand Resisted gait fwd and laterally with step over STS with ADD ball  squeeze 10x STS with tband ABD 10 x   01/12/24 Goals assessed and documented for renewal Standing stepping 2 min with UE support Fitter standing 3 way BIL 15 x each- UE support 2 blue STW to left lateral thigh,less tightness and tender laterally but more quad tightness. Very tender over GT so added ionto 1.2 cc dex 4 hour patch  01/09/24 Nustep L 6 10 min Stepping laterally over 6 inch box 10 x with UE support- CGA Resisted stepping for strength and balance  fwd and laterally CGA 3# step taps 8 inch from 1 foot out 20 x 2 x 3# single HHA high knee marching 25 feet fwd and walking backward 2 x eahc 3# vector taps  01/04/24 Nustep L 5 10 min LE only 30# resisted gait 5 x fwd and back, then 3 x each side Sit to stand on airex 10 x CGA- no LOB Vector taps on airex CGA, only 1 LOB that required assist Obstacles course stepping on/over and around- CGA with cuing to get closer to obj and step over vs swing Fitter 1 blue standing hip flex,ext and abd 15 x each CGA Leg  press 30# 2 sets10 Calf raises 30# 2 sets 10   01/02/24 Nustep L 5 8 min LE only 20# side stepping over 2 ( 1 inch) sticks 5 x each side CG-min A 3# more square stepping fwd and SW CG-min A 3# HHA marching fwd and backward 25 feet 3# side stepping 10 feet 4 x each Fitter 1 blue standing hip flex,ext and abd 15 x each CGA   12/29/23 Answered all pnt questions at length re: MD appt. Helping her to understand everything better and how how entire Lower quadrep works together. Addressed questions re: knee brace and stated if tape helps okay to stick with it Information given about [employer] for orthotics an dincerts Updated HEP Nustep L 5 10 min  12/21/23 Taped left knee for medial pull and educ pt and husband on taping 3# SLR 2 sets 10 3 # SLR with ER 2 sets 10 3# KTC 2 sets 10 3# IR and ER 2 sets 10 Nustep L 5 Resisted gait Stepping actvities  Faom mat for balance  12/15/23 BERG 50/56 Nustep L 5  More Square- balance and stepping activities Vector taps with green tband 10 x BIL Side stepping green tband 10 feet 5 x each Standing on BIL dyna disc func reaching and mini squats 6 inch alt step taps 20 x 2 sets KT tape to left patella to shift medially    12/14/23 Nustep L 5 8 min Step up for ADD LEFT LE 2 sets 10 with HHA with 4  inch ( 6 inch too hard/high) Foam mat side stepping 5 x in // bars Knee ext 10# 2 sets 10 with ball squeeze for VMO HS curl 25# 2 sets 10 with ball squeeze Trunk flex and ext with resistance 20 x with VMO ball squeeze     12/08/23 Nustep L 5 BERG 50/56 LAQ with ball squeeze 3# 2 sets 10 SAQ with ball squeeze 3# 2 sets 10 SLR with ER 3# 2 sets 10 3# side stepping 10 feet 3 x SL ADD 3# 10 x KT tape for laterally shifted left patella   12/01/23 Nustep L 5 Tandem gait on foam beam 5 x minimal UE support Side stepping on foam mat 5 x each way min UE support More square L 2 stepping fwd and SW STS 10 x from standard chair 10 x BIL LE And trunk PROM with STW to left ITB     11/14/23 BERG 47/56 Nustep L 5 6 inch lateral step across min a with cuing to keep feet fwd 10 x Red tband seated IR/ER 15 x each Red tband side stepping on foam beam no hands CGA 3 x Tandem on beam no hands 2 x CGA Leg Press 30# 3 sets 10 feet 3 ways for hip Navigating various height steps no AD CGA       09/05/23 EVAL  PATIENT EDUCATION:  Education details: POC, HEP, sciatica, hip flexor tightness sleeping in recliners Person educated: Patient Education method: Medical Illustrator Education comprehension: verbalized understanding and returned demonstration  HOME EXERCISE PROGRAM:  12/29/23 adjusted HEP-Access Code: [email or web address] URL: [email or web address] Date: 12/29/2023 Prepared by: Jon  Sania Noy  Exercises - Seated Long Arc Quad  - 3 x daily - 7 x weekly - 1 sets - 15 reps - 3-5 hold - Standing Hip Flexion with Resistance at Ankles and Counter Support  - 1-2 x daily - 7 x weekly - 2 sets - 10 reps - Standing Hip Abduction with Resistance at Ankles and Unilateral Counter Support  - 1-2 x daily - 7 x weekly - 2 sets - 10 reps - Standing Hip Extension with Resistance at Ankles and Unilateral Counter Support  - 1-2 x daily - 7 x weekly - 2 sets - 10 reps  Added to HEP. LAQ with ball squeeze, SL hip ADD and SLR with ER Access Code: [email or web address] URL: [email or web address] Date: 10/03/2023 Prepared by: Sendy Pluta  Exercises - Supine Bridge  - 1 x daily - 7 x weekly - 1 sets - 10 reps - 3 hold - Supine Hip Adduction Isometric with Ball  - 1 x daily - 7 x weekly - 2 sets - 10 reps - Supine March  - 1 x daily - 7 x weekly - 2 sets - 10 reps - Hooklying Clamshell with Resistance  - 1 x daily - 7 x weekly - 2 sets - 10 reps - Supine Straight Leg Raises  - 1 x daily - 7 x weekly - 3 sets - 10 reps - Seated Long Arc Quad  - 1 x daily - 7 x weekly - 2 sets - 10 reps - Seated March  - 1 x daily - 7 x weekly - 2 sets - 10 reps - Seated Hip Flexion and External Rotation  - 1 x daily - 7 x weekly - 2 sets - 10 reps - Standing March with Counter Support  - 1 x daily - 7 x weekly - 1 sets - 10 reps - Standing Hip Abduction with Counter Support  - 1 x daily - 7 x weekly - 1 sets - 10 reps - Standing Hip Extension with Counter Support  - 1 x daily - 7 x weekly - 1 sets - 10 reps Access Code: [email or web address] URL: [email or web address] Date: 09/05/2023 Prepared by: Almetta Fam  Exercises - Supine Lower Trunk Rotation  - 1 x daily - 7 x weekly - 2 sets - 10 reps - Supine Bridge  - 1 x daily - 7 x weekly - 2 sets - 10 reps - Supine Figure 4 Piriformis Stretch  - 1 x daily - 7 x weekly - 2 reps - 15 hold - Modified Thomas Stretch  - 1 x daily - 7 x weekly - 2 reps - 15  hold  ASSESSMENT:  CLINICAL IMPRESSION:  pnt arrived feeling better and walking better, goals assessed and documented. Pnt is making good progress. Focus on strength and balance.Will plan to D/C  and focus on HEP and return to ex classes. ALL goals met except still struggling with sleep.  OBJECTIVE IMPAIRMENTS: Abnormal gait, decreased activity tolerance, decreased balance, decreased ROM, decreased strength, and pain.     ACTIVITY LIMITATIONS: bending, squatting, stairs, and locomotion level  PARTICIPATION LIMITATIONS: cleaning, laundry, driving, shopping, community activity, and yard work  PERSONAL FACTORS: Age, Past/current experiences, Time since onset of injury/illness/exacerbation, and 1-2  comorbidities: arthritis, hx of surgery are also affecting patient's functional outcome.   REHAB POTENTIAL: Good  CLINICAL DECISION MAKING: Stable/uncomplicated  EVALUATION COMPLEXITY: Low  GOALS: Goals reviewed with patient? Yes  SHORT TERM GOALS: Target date: 10/10/23  Patient will be independent with initial HEP.  Baseline:  Goal status: 09/14/23 progressing  09/28/23 MET  2.  Patient will report centralization of radicular symptoms.  Baseline: pain in both legs Goal status: 09/28/23 no changes   10/03/23 progressing   MET 10/20/23   LONG TERM GOALS: Target date: 02/09/24  Patient will be independent with advanced/ongoing HEP to improve outcomes and carryover.  Baseline:  Goal status: MET 10/20/23 btwn HEP and ex class  2.  Patient will report 50-75% improvement in low back pain to improve QOL.  Baseline: 5/10 Goal status: 10/03/23 progressing  10/20/23 40% progressing and 10/24/23 10/31/23 progressing   11/14/23 progressing  12/15/23 progressing 12/21/23 progressing  and 12/29/23 01/12/24 overall 80-85% better MET  3.  Patient will demonstrate full pain free lumbar ROM to perform ADLs.   Baseline: see chart Goal status: 10/03/23 progressing  and 10/20/23  and 10/24/23. 9/9 WFL except ext  limited 50%  12/01/23 WFLs ext limited 75%  12/15/23 WFL minus extension limited 75%  and 12/21/23 01/12/24  WFLs including ext MET  4.  Patient will be up and down a set of stairs without pain.  Baseline: pain going up and down Goal status: ongoing 10/03/23  progressing 10/20/23  and 10/24/23  and 11/10/23  and 11/14/23  12/15/23 varies but overall reports much better  01/04/24 MET  5.  Patient will be able to sleep in her bed again for at least 3-4 hrs.  Baseline: 1 hr tolerance  Goal status: 10/03/23 progressing  and 10/20/23. 10/31/23 progressing slowly- hard to get comfortable  progressing 11/10/23  12/01/23 on going  and 12/15/23  01/04/24 still an ongoing issue- mostly back 01/12/24 on going 01/30/24 progressing . 02/06/24 progressing  6.  Patient will tolerate 30 min of standing to perform ADLs and household chores Baseline: ~10 mins Goal status: 10/03/23 progressing 10/20/23 progressing 15-20  . MET 10/24/23 pt stated stated did 30-45 min  7.  Goal added 11/14/23 Will increase Berg to 52/56 for increased safety and function to reduce risk for falls. Baseline:  11/14/23 47/56  12/01/23 progressing   12/15/23 50/56 progressing  and 01/04/24   01/11/24 progressing 50/56  and 01/26/24  52/56 02/06/24 MET  8.  Increase Gait to be able to walk 10 minutes without assistive device.  12/01/23 progressing  and 12/29/23 01/11/24 progressing and 01/30/24 MET 02/06/24  PLAN:  PT FREQUENCY: 2x/week  PT DURATION: 10 weeks  PLANNED INTERVENTIONS: 97110-Therapeutic exercises, 97530- Therapeutic activity, 97112- Neuromuscular re-education, 97535- Self Care, 02859- Manual therapy, [phone]- Gait training, [phone]- Electrical stimulation (unattended), 97016- Vasopneumatic device, [id number]- Traction (mechanical), [id number]- Ionotophoresis 4mg /ml Dexamethasone , 79439 (1-2 muscles), 20561 (3+ muscles)- Dry Needling, Patient/Family education, Balance training, Stair training, Taping, Joint mobilization, Spinal mobilization, Cryotherapy,  and Moist heat.  PLAN FOR NEXT SESSION  D/C   PHYSICAL THERAPY DISCHARGE SUMMARY   Patient agrees to discharge. Patient goals were partially met. Patient is being discharged due to being pleased with the current functional level.  Jon Demmi Sindt PTA 02/06/2024 10:01 AM

## 2024-02-06 NOTE — Progress Notes (Unsigned)
°  Electrophysiology Office Note:   Date:  02/07/2024  ID:  Margaret Reese, DOB 07-28-1945, MRN [id number]  Primary Cardiologist: [name], DO Primary Heart Failure: None Electrophysiologist: Kennya Schwenn Gladis Norton, MD      History of Present Illness:   Margaret Reese is a 78 y.o. female with h/o sleep apnea, hypertension, second-degree AV block, hyperlipidemia, obesity seen today for routine electrophysiology followup.   Discussed the use of AI scribe software for clinical note transcription with the patient, who gave verbal consent to proceed.  History of Present Illness Margaret Reese is a 78 year old female with a pacemaker who presents for routine follow-up.  She reports having no issues with her pacemaker. She often recommends the procedure to others, describing it as 'the easiest thing you'll have done.' She is aware of her dependency on the device and expresses concern about the possibility of it stopping.  Her blood pressure is slightly elevated, which she attributes to weight gain and the stress of visiting the doctor's office. She does not express any significant concerns about this.  There have been no issues with remote readings from her pacemaker, with consistent data being received in recent months.   she denies chest pain, palpitations, dyspnea, PND, orthopnea, nausea, vomiting, dizziness, syncope, edema, weight gain, or early satiety.   Review of systems complete and found to be negative unless listed in HPI.      EP Information / Studies Reviewed:    EKG is not ordered today. EKG from 02/20/2024 reviewed which showed atrial sensed, ventricular paced      PPM Interrogation-  reviewed in detail today,  See PACEART report.  Device History: Abbott Dual Chamber PPM implanted 2018 for Second Degree AV block  Risk Assessment/Calculations:           Physical Exam:   VS:  BP (!) 140/88 (BP Location: Right Arm, Patient Position: Sitting, Cuff Size: Normal)    Pulse 78   Ht 5' 1 (1.549 m)   Wt 204 lb (92.5 kg)   SpO2 96%   BMI 38.55 kg/m    Wt Readings from Last 3 Encounters:  02/07/24 204 lb (92.5 kg)  01/24/24 201 lb 9.6 oz (91.4 kg)  11/07/23 194 lb (88 kg)     GEN: Well nourished, well developed in no acute distress NECK: No JVD; No carotid bruits CARDIAC: Regular rate and rhythm, no murmurs, rubs, gallops RESPIRATORY:  Clear to auscultation without rales, wheezing or rhonchi  ABDOMEN: Soft, non-tender, non-distended EXTREMITIES:  No edema; No deformity   ASSESSMENT AND PLAN:    Second Degree AV block s/p Abbott PPM  Normal PPM function See Pace Art report No changes today  2.  Hypertension: Mildly elevated today.  Usually well-controlled.  3.  Hyperlipidemia: Continue statin per primary cardiology  Disposition:   Follow up with EP Team in 12 months  Signed, Rionna Feltes Gladis Norton, MD

## 2024-02-07 ENCOUNTER — Ambulatory Visit: Attending: Cardiovascular Disease | Admitting: Cardiology

## 2024-02-07 ENCOUNTER — Encounter: Payer: Self-pay | Admitting: Cardiology

## 2024-02-07 VITALS — BP 140/88 | HR 78 | Ht 61.0 in | Wt 204.0 lb

## 2024-02-07 DIAGNOSIS — I10 Essential (primary) hypertension: Secondary | ICD-10-CM | POA: Diagnosis present

## 2024-02-07 DIAGNOSIS — I441 Atrioventricular block, second degree: Secondary | ICD-10-CM | POA: Insufficient documentation

## 2024-02-09 ENCOUNTER — Ambulatory Visit: Admitting: Physical Therapy

## 2024-02-16 ENCOUNTER — Ambulatory Visit: Admitting: Physical Therapy

## 2024-02-23 ENCOUNTER — Other Ambulatory Visit: Payer: Self-pay | Admitting: Cardiology

## 2024-02-23 DIAGNOSIS — R002 Palpitations: Secondary | ICD-10-CM

## 2024-02-26 ENCOUNTER — Telehealth: Payer: Self-pay | Admitting: Cardiology

## 2024-02-26 DIAGNOSIS — R002 Palpitations: Secondary | ICD-10-CM

## 2024-02-26 NOTE — Telephone Encounter (Signed)
" °*  STAT* If patient is at the pharmacy, call can be transferred to refill team.   1. Which medications need to be refilled? (please list name of each medication and dose if known)   diltiazem  (CARDIZEM  CD) 120 MG 24 hr capsule     2. Would you like to learn more about the convenience, safety, & potential cost savings by using the [facility]? No    3. Are you open to using the [facility] (Type [facility]. No    4. Which pharmacy/location (including street and city if local pharmacy) is medication to be sent to? [id number] - [place], [place] - [address]     5. Do they need a 30 day or 90 day supply? 90 day   Pt is out of medication  "

## 2024-02-27 NOTE — Telephone Encounter (Signed)
 [facility] is following up. They did not receive the prescription and would like to know if it can be sent in again.

## 2024-02-28 MED ORDER — DILTIAZEM HCL ER COATED BEADS 120 MG PO CP24: 120.0000 mg | ORAL_CAPSULE | Freq: Every day | ORAL | 3 refills | Status: AC

## 2024-03-06 ENCOUNTER — Other Ambulatory Visit: Payer: Self-pay | Admitting: Radiology

## 2024-03-06 ENCOUNTER — Ambulatory Visit (INDEPENDENT_AMBULATORY_CARE_PROVIDER_SITE_OTHER): Admitting: Physician Assistant

## 2024-03-06 ENCOUNTER — Encounter: Payer: Self-pay | Admitting: Physician Assistant

## 2024-03-06 DIAGNOSIS — M5416 Radiculopathy, lumbar region: Secondary | ICD-10-CM

## 2024-03-06 DIAGNOSIS — M7062 Trochanteric bursitis, left hip: Secondary | ICD-10-CM | POA: Diagnosis not present

## 2024-03-06 NOTE — Progress Notes (Signed)
 "  Office Visit Note   Patient: Margaret Reese           Date of Birth: 02-Jan-1946           MRN: [id number] Visit Date: 03/06/2024              Requested by: [name], MD [address] [place],  [place] [address] PCP: [name], MD   Assessment & Plan: Visit Diagnoses:  1. Trochanteric bursitis, left hip   2. Radiculopathy, lumbar region     Plan: Given her continued pain into her thighs despite conservative treatment which is included time injections along with radiographic and clinical findings recommend MRI rule out lumbar spinal stenosis as the source of her thigh pain.  Have her follow-up after the MRI to go over results and discuss further treatment.  She has been performing abduction exercises as shown by therapy recommended that she stop these and continue the IT band stretching exercises we have shown her in the past.  Follow-Up Instructions: Return After MRI.   Orders:  No orders of the defined types were placed in this encounter.  No orders of the defined types were placed in this encounter.     Procedures: No procedures performed   Clinical Data: No additional findings.   Subjective: Chief Complaint  Patient presents with   Lower Back - Pain   Left Knee - Pain    HPI Margaret Reese returns today stating that the left knee injection and left greater trochanteric injection on 12/27/2023 gave her no real results.  She is using a cane to ambulate.  Continues to have pain in the lateral and anterior thighs bilaterally.  States walking for long distances makes the pain worse.  She did discontinue the lift in her shoe and got inserts for both shoes and feels that this has made some difference.  Denies any numbness tingling down either leg. Prior radiographs of her lumbar spine dated 08/16/2023 showed a grade 1 spondylolisthesis L4 on L5.  Lower lumbar facet changes.  Slight scoliosis.  Review of Systems  Constitutional:  Negative for chills and  fever.  Musculoskeletal:  Positive for back pain.  Neurological:  Negative for numbness.     Objective: Vital Signs: There were no vitals taken for this visit.  Physical Exam Constitutional:      Appearance: She is not ill-appearing or diaphoretic.  Pulmonary:     Effort: Pulmonary effort is normal.  Neurological:     Mental Status: She is alert and oriented to person, place, and time.  Psychiatric:        Mood and Affect: Mood normal.     Ortho Exam Lower extremities: Tenderness over the left hip trochanteric region.  Tenderness lumbar paraspinous region bilaterally.  5 out of 5 strength throughout the lower extremities against resistance except for the left great toe which is 4 out of 5 with extension against resistance.  Negative straight leg raise bilaterally.  She has limited extension of the lumbar spine with discomfort.  Flexion she comes within 1 inches of touching her toes. Specialty Comments:  No specialty comments available.  Imaging: No results found.   PMFS History: Patient Active Problem List   Diagnosis Date Noted   Back pain 12/26/2022   Fluid retention in legs 11/13/2022   Stress 06/14/2022   Obesity (HCC)- Start BMI 40.06 05/12/2022   OSA (obstructive sleep apnea) 05/03/2022   Hyperglycemia 03/15/2022   Heart block AV complete (HCC) 01/05/2022  Pulmonary hypertension (HCC) 11/19/2021   Chronic diastolic (congestive) heart failure (HCC) 06/01/2020   Pacemaker S/P [employer] Jude [employer]urity MRI model EF7727 05/20/2016   History of total right hip arthroplasty 01/30/2015   Arthritis 09/11/2014   Renal cyst 09/04/2014   Allergic rhinitis due to pollen 09/04/2014   Essential hypertension 07/08/2010   GERD (gastroesophageal reflux disease) 07/08/2010   Calcium oxalate renal stones 07/08/2010   Hyperlipidemia 07/08/2010   Past Medical History:  Diagnosis Date   Allergy    Anxiety    Arthritis    Asthma    Complication of anesthesia    pt states has  difficulty awakening; also has increased sinus drainage   Constipation    Edema, lower extremity    Encounter for interrogation of cardiac pacemaker 09/05/2018   Falls    GERD (gastroesophageal reflux disease)    H/O back injury    History of bronchitis    History of colon polyps    Hypertension    Imbalance    Kidney cysts    pt states not sure which kidney does see kidney specialist yearly pt states every thing okay currently    Legally blind in right eye, as defined in [place]     Mobitz type II atrioventricular block 11/08/2016   Multiple gastric ulcers    Obesity    OSA (obstructive sleep apnea) 05/03/2022   Pacemaker S/P St Jude Medical Assurity MRI model EF7727 05/20/2016   Renal stone    Retinal vein occlusion (HCC)    Shortness of breath    Sinus node dysfunction (HCC) 12/16/2018   Tingling    left arm    Trace cataracts    Urinary frequency    Vitamin D  deficiency     Family History  Problem Relation Age of Onset   Depression Mother    Stroke Mother    Kidney disease Mother    Bipolar disorder Mother    Liver disease Mother    Eating disorder Mother    Obesity Mother    Asthma Brother     Past Surgical History:  Procedure Laterality Date   CARDIOVASCULAR STRESS TEST  dec 2016   CHOLECYSTECTOMY     COLONOSCOPY  2011   EYE SURGERY Bilateral    Cataract surgery.    LEFT HEART CATH AND CORONARY ANGIOGRAPHY N/A 05/19/2016   Procedure: Left Heart Cath and Coronary Angiography;  Surgeon: Salena Negri, MD;  Location: [facility];  Service: Cardiovascular;  Laterality: N/A;   PACEMAKER IMPLANT N/A 05/20/2016   Procedure: Pacemaker Implant;  Surgeon: Will Gladis Norton, MD;  Location: [facility];  Service: Cardiovascular;  Laterality: N/A;   TONSILLECTOMY     TOTAL HIP ARTHROPLASTY Right 01/30/2015   Procedure: RIGHT TOTAL HIP ARTHROPLASTY ANTERIOR APPROACH AND REMOVAL LIPOMA RIGHT HIP;  Surgeon: Lonni CINDERELLA Poli, MD;  Location: [facility];  Service:  Orthopedics;  Laterality: Right;   UPPER GI ENDOSCOPY     Social History   Occupational History   Occupation: retired child psychotherapist  Tobacco Use   Smoking status: Never    Passive exposure: Never   Smokeless tobacco: Never  Vaping Use   Vaping status: Never Used  Substance and Sexual Activity   Alcohol use: No   Drug use: No   Sexual activity: Yes        "

## 2024-03-14 ENCOUNTER — Ambulatory Visit: Payer: Self-pay | Admitting: Cardiology

## 2024-03-14 LAB — CUP PACEART INCLINIC DEVICE CHECK
Battery Remaining Longevity: 20 mo
Battery Voltage: 2.89 V
Brady Statistic RA Percent Paced: 41 %
Brady Statistic RV Percent Paced: 99.99 %
Date Time Interrogation Session: 20251217124800
Implantable Lead Connection Status: 753985
Implantable Lead Connection Status: 753985
Implantable Lead Implant Date: 20180330
Implantable Lead Implant Date: 20180330
Implantable Lead Location: 753859
Implantable Lead Location: 753860
Implantable Pulse Generator Implant Date: 20180330
Lead Channel Impedance Value: 362.5 Ohm
Lead Channel Impedance Value: 462.5 Ohm
Lead Channel Pacing Threshold Amplitude: 0.75 V
Lead Channel Pacing Threshold Amplitude: 0.75 V
Lead Channel Pacing Threshold Amplitude: 0.75 V
Lead Channel Pacing Threshold Amplitude: 0.75 V
Lead Channel Pacing Threshold Pulse Width: 0.5 ms
Lead Channel Pacing Threshold Pulse Width: 0.5 ms
Lead Channel Pacing Threshold Pulse Width: 0.5 ms
Lead Channel Pacing Threshold Pulse Width: 0.5 ms
Lead Channel Sensing Intrinsic Amplitude: 10.3 mV
Lead Channel Sensing Intrinsic Amplitude: 5 mV
Lead Channel Setting Pacing Amplitude: 2 V
Lead Channel Setting Pacing Amplitude: 2.5 V
Lead Channel Setting Pacing Pulse Width: 0.5 ms
Lead Channel Setting Sensing Sensitivity: 4 mV
Pulse Gen Model: 2272
Pulse Gen Serial Number: 7998534

## 2024-03-16 ENCOUNTER — Other Ambulatory Visit: Payer: Self-pay | Admitting: Cardiology

## 2024-03-16 DIAGNOSIS — I10 Essential (primary) hypertension: Secondary | ICD-10-CM

## 2024-07-16 ENCOUNTER — Ambulatory Visit: Payer: Self-pay | Admitting: Family Medicine

## 2024-07-16 ENCOUNTER — Encounter

## 2024-07-23 ENCOUNTER — Ambulatory Visit: Admitting: Family Medicine

## 2024-10-15 ENCOUNTER — Encounter

## 2025-01-14 ENCOUNTER — Encounter

## 2025-04-15 ENCOUNTER — Encounter

## 2025-07-15 ENCOUNTER — Encounter
